# Patient Record
Sex: Male | Born: 1970 | Race: Black or African American | Hispanic: No | Smoking: Never smoker
Health system: Southern US, Community
[De-identification: ages and names within clinical notes are randomized; demographics above are authoritative.]

## PROBLEM LIST (undated history)

## (undated) ENCOUNTER — Emergency Department: Discharge status: Absent without leave | Payer: Federal, State, Local not specified - PPO

## (undated) DIAGNOSIS — D649 Anemia, unspecified: Secondary | ICD-10-CM

## (undated) DIAGNOSIS — I2510 Atherosclerotic heart disease of native coronary artery without angina pectoris: Secondary | ICD-10-CM

## (undated) DIAGNOSIS — C7951 Secondary malignant neoplasm of bone: Secondary | ICD-10-CM

## (undated) DIAGNOSIS — E119 Type 2 diabetes mellitus without complications: Secondary | ICD-10-CM

## (undated) DIAGNOSIS — C642 Malignant neoplasm of left kidney, except renal pelvis: Secondary | ICD-10-CM

## (undated) DIAGNOSIS — E7800 Pure hypercholesterolemia, unspecified: Secondary | ICD-10-CM

## (undated) DIAGNOSIS — I10 Essential (primary) hypertension: Secondary | ICD-10-CM

## (undated) DIAGNOSIS — I2694 Multiple subsegmental pulmonary emboli without acute cor pulmonale: Secondary | ICD-10-CM

## (undated) DIAGNOSIS — I700 Atherosclerosis of aorta: Secondary | ICD-10-CM

## (undated) DIAGNOSIS — K219 Gastro-esophageal reflux disease without esophagitis: Secondary | ICD-10-CM

## (undated) DIAGNOSIS — N289 Disorder of kidney and ureter, unspecified: Secondary | ICD-10-CM

## (undated) DIAGNOSIS — I5042 Chronic combined systolic (congestive) and diastolic (congestive) heart failure: Secondary | ICD-10-CM

## (undated) DIAGNOSIS — C801 Malignant (primary) neoplasm, unspecified: Secondary | ICD-10-CM

## (undated) DIAGNOSIS — T7840XA Allergy, unspecified, initial encounter: Secondary | ICD-10-CM

## (undated) DIAGNOSIS — J309 Allergic rhinitis, unspecified: Secondary | ICD-10-CM

## (undated) DIAGNOSIS — G4730 Sleep apnea, unspecified: Secondary | ICD-10-CM

## (undated) HISTORY — DX: Essential (primary) hypertension: I10

## (undated) HISTORY — PX: CORONARY ANGIOPLASTY: SHX604

## (undated) HISTORY — DX: Gastro-esophageal reflux disease without esophagitis: K21.9

## (undated) HISTORY — DX: Type 2 diabetes mellitus without complications: E11.9

## (undated) HISTORY — DX: Sleep apnea, unspecified: G47.30

## (undated) HISTORY — DX: Allergic rhinitis, unspecified: J30.9

## (undated) HISTORY — DX: Allergy, unspecified, initial encounter: T78.40XA

---

## 1999-06-28 ENCOUNTER — Emergency Department (HOSPITAL_COMMUNITY): Admission: EM | Admit: 1999-06-28 | Discharge: 1999-06-28 | Payer: Self-pay | Admitting: Emergency Medicine

## 2004-06-04 ENCOUNTER — Encounter: Payer: Self-pay | Admitting: Family Medicine

## 2004-06-09 ENCOUNTER — Ambulatory Visit: Payer: Self-pay | Admitting: Family Medicine

## 2004-06-17 ENCOUNTER — Ambulatory Visit: Payer: Self-pay | Admitting: Family Medicine

## 2005-02-24 ENCOUNTER — Ambulatory Visit: Payer: Self-pay | Admitting: Family Medicine

## 2005-08-25 ENCOUNTER — Ambulatory Visit: Payer: Self-pay | Admitting: Family Medicine

## 2006-03-17 ENCOUNTER — Ambulatory Visit: Payer: Self-pay | Admitting: Family Medicine

## 2006-12-20 ENCOUNTER — Ambulatory Visit: Payer: Self-pay | Admitting: Family Medicine

## 2006-12-20 DIAGNOSIS — R030 Elevated blood-pressure reading, without diagnosis of hypertension: Secondary | ICD-10-CM | POA: Insufficient documentation

## 2007-01-19 ENCOUNTER — Encounter: Payer: Self-pay | Admitting: Family Medicine

## 2007-01-19 DIAGNOSIS — J309 Allergic rhinitis, unspecified: Secondary | ICD-10-CM

## 2007-01-19 DIAGNOSIS — K219 Gastro-esophageal reflux disease without esophagitis: Secondary | ICD-10-CM | POA: Insufficient documentation

## 2007-01-19 DIAGNOSIS — T7840XA Allergy, unspecified, initial encounter: Secondary | ICD-10-CM | POA: Insufficient documentation

## 2007-01-19 HISTORY — DX: Gastro-esophageal reflux disease without esophagitis: K21.9

## 2007-01-19 HISTORY — DX: Allergic rhinitis, unspecified: J30.9

## 2007-01-23 ENCOUNTER — Ambulatory Visit: Payer: Self-pay | Admitting: Family Medicine

## 2007-02-07 ENCOUNTER — Ambulatory Visit: Payer: Self-pay | Admitting: Internal Medicine

## 2007-02-07 LAB — CONVERTED CEMR LAB
AST: 19 units/L (ref 0–37)
Alkaline Phosphatase: 57 units/L (ref 39–117)
Basophils Absolute: 0 10*3/uL (ref 0.0–0.1)
Basophils Relative: 0.8 % (ref 0.0–1.0)
Bilirubin, Direct: 0.3 mg/dL (ref 0.0–0.3)
CO2: 29 meq/L (ref 19–32)
GFR calc non Af Amer: 135 mL/min
Glucose, Bld: 100 mg/dL — ABNORMAL HIGH (ref 70–99)
HCT: 43.5 % (ref 39.0–52.0)
HDL: 45.7 mg/dL (ref >39.0)
Hemoglobin, Urine: NEGATIVE
Hemoglobin: 14.7 g/dL (ref 13.0–17.0)
Ketones, ur: NEGATIVE mg/dL
MCHC: 33.8 g/dL (ref 30.0–36.0)
Monocytes Absolute: 0.3 10*3/uL (ref 0.2–0.7)
Monocytes Relative: 5.6 % (ref 3.0–11.0)
Nitrite: NEGATIVE
Platelets: 325 10*3/uL (ref 150–400)
RBC: 4.98 M/uL (ref 4.22–5.81)
RDW: 12.4 % (ref 11.5–14.6)
Specific Gravity, Urine: 1.025 (ref 1.000–1.03)
TSH: 0.63 microintl units/mL (ref 0.35–5.50)
Total CHOL/HDL Ratio: 3.8
Triglycerides: 72 mg/dL (ref 0–149)
Urobilinogen, UA: 0.2 (ref 0.0–1.0)
VLDL: 14 mg/dL (ref 0–40)
WBC: 5.1 10*3/uL (ref 4.5–10.5)
pH: 6 (ref 5.0–8.0)

## 2007-02-16 ENCOUNTER — Encounter: Payer: Self-pay | Admitting: Internal Medicine

## 2007-02-16 ENCOUNTER — Ambulatory Visit: Payer: Self-pay | Admitting: Internal Medicine

## 2008-03-15 ENCOUNTER — Ambulatory Visit: Payer: Self-pay | Admitting: Internal Medicine

## 2008-03-15 DIAGNOSIS — J019 Acute sinusitis, unspecified: Secondary | ICD-10-CM | POA: Insufficient documentation

## 2008-04-29 ENCOUNTER — Ambulatory Visit: Payer: Self-pay | Admitting: Internal Medicine

## 2008-05-03 ENCOUNTER — Ambulatory Visit: Payer: Self-pay | Admitting: Endocrinology

## 2008-05-03 DIAGNOSIS — R059 Cough, unspecified: Secondary | ICD-10-CM | POA: Insufficient documentation

## 2008-05-03 DIAGNOSIS — R05 Cough: Secondary | ICD-10-CM

## 2008-05-03 DIAGNOSIS — R053 Chronic cough: Secondary | ICD-10-CM | POA: Insufficient documentation

## 2008-05-03 DIAGNOSIS — R052 Subacute cough: Secondary | ICD-10-CM | POA: Insufficient documentation

## 2008-05-06 ENCOUNTER — Telehealth: Payer: Self-pay | Admitting: Endocrinology

## 2008-05-07 ENCOUNTER — Ambulatory Visit: Payer: Self-pay | Admitting: Endocrinology

## 2008-05-17 ENCOUNTER — Emergency Department (HOSPITAL_COMMUNITY): Admission: EM | Admit: 2008-05-17 | Discharge: 2008-05-17 | Payer: Self-pay | Admitting: Emergency Medicine

## 2009-02-27 ENCOUNTER — Ambulatory Visit: Payer: Self-pay | Admitting: Internal Medicine

## 2009-02-27 LAB — CONVERTED CEMR LAB
AST: 20 units/L (ref 0–37)
Alkaline Phosphatase: 62 units/L (ref 39–117)
Basophils Absolute: 0.1 10*3/uL (ref 0.0–0.1)
Basophils Relative: 1.1 % (ref 0.0–3.0)
Bilirubin, Direct: 0.2 mg/dL (ref 0.0–0.3)
Chloride: 102 meq/L (ref 96–112)
Eosinophils Absolute: 0.1 10*3/uL (ref 0.0–0.7)
Eosinophils Relative: 2.3 % (ref 0.0–5.0)
HCT: 50 % (ref 39.0–52.0)
Hemoglobin: 17 g/dL (ref 13.0–17.0)
Ketones, ur: NEGATIVE mg/dL
LDL Cholesterol: 116 mg/dL — ABNORMAL HIGH (ref 0–99)
Leukocytes, UA: NEGATIVE
Lymphocytes Relative: 31.9 % (ref 12.0–46.0)
Lymphs Abs: 1.8 10*3/uL (ref 0.7–4.0)
Monocytes Absolute: 0.4 10*3/uL (ref 0.1–1.0)
Platelets: 281 10*3/uL (ref 150.0–400.0)
RDW: 12.1 % (ref 11.5–14.6)
Sodium: 138 meq/L (ref 135–145)
TSH: 0.81 microintl units/mL (ref 0.35–5.50)
Total Bilirubin: 1.6 mg/dL — ABNORMAL HIGH (ref 0.3–1.2)
Total Protein, Urine: NEGATIVE mg/dL
Total Protein: 7.3 g/dL (ref 6.0–8.3)
Triglycerides: 65 mg/dL (ref 0.0–149.0)
Urobilinogen, UA: 0.2 (ref 0.0–1.0)
VLDL: 13 mg/dL (ref 0.0–40.0)
WBC: 5.5 10*3/uL (ref 4.5–10.5)

## 2009-03-06 ENCOUNTER — Ambulatory Visit: Payer: Self-pay | Admitting: Internal Medicine

## 2009-10-06 ENCOUNTER — Telehealth: Payer: Self-pay | Admitting: Internal Medicine

## 2009-11-18 ENCOUNTER — Telehealth: Payer: Self-pay | Admitting: Internal Medicine

## 2009-11-18 DIAGNOSIS — R21 Rash and other nonspecific skin eruption: Secondary | ICD-10-CM | POA: Insufficient documentation

## 2009-11-21 ENCOUNTER — Telehealth: Payer: Self-pay | Admitting: Internal Medicine

## 2010-03-25 ENCOUNTER — Emergency Department (HOSPITAL_COMMUNITY): Admission: EM | Admit: 2010-03-25 | Discharge: 2010-03-25 | Payer: Self-pay | Admitting: Emergency Medicine

## 2010-04-08 ENCOUNTER — Ambulatory Visit: Payer: Self-pay | Admitting: Internal Medicine

## 2010-05-25 ENCOUNTER — Ambulatory Visit: Payer: Self-pay | Admitting: Internal Medicine

## 2010-08-06 NOTE — Assessment & Plan Note (Signed)
Summary: ER FU ON ACID REFLUX/NWS  #   Vital Signs:  Patient profile:   40 year old male Height:      70 inches Weight:      179 pounds BMI:     25.78 O2 Sat:      98 % on Room air Temp:     98.4 degrees F oral Pulse rate:   80 / minute BP sitting:   140 / 88  (left arm) Cuff size:   regular  Vitals Entered By: Bill Salinas CMA (April 08, 2010 11:41 AM)  O2 Flow:  Room air CC: ER follow up on Acid Reflux/ ab   Primary Care Provider:  norins  CC:  ER follow up on Acid Reflux/ ab.  History of Present Illness: Paul Flowers is a 40 y.o. African American male who recently went to the ER on 21SEP2011 for acute, substernal pressure-like chest pain. He was diagnosed with acid reflux and given prevacid (30 mg). He has been taking Acid Controller 20 mg once daily for his acid reflux. He admits to be experiencing reccurent reflux despite taking one pill of Acid Controller a day.  Paul Flowers was also concerned about a recent episode on diarrhea and what he suspected to be a bloody stool. The day he had his diarrhea he had been taking chewable Pepto-Bismol. The bloody stool he was concerned about occured the next day.   Current Medications (verified): 1)  Prevacid 30 Mg Cpdr (Lansoprazole) .... As Needed 2)  Fluticasone Propionate 50 Mcg/act Susp (Fluticasone Propionate) .Marland Kitchen.. 1 Spray Per Nostril Daily 3)  Xyzal 5 Mg Tabs (Levocetirizine Dihydrochloride) .... Once Daily As Needed 4)  [facility] Acid Controller Max St 20 Mg Tabs (Famotidine) .Marland Kitchen.. 1 Tablet Once Daily  Allergies (verified): No Known Drug Allergies  Past History:  Past Medical History: Last updated: 02/16/2007 Allergic rhinitis GERD pre-hypertension hx of mumps and chicken pox  Past Surgical History: Last updated: 03/15/2008 Denies surgical history PSH reviewed for relevance, FH reviewed for relevance  Review of Systems       Chest pain was the original symptom and why he presented to the ER on 21SEP2011 His concern  about melena was alleviated when he was told that pepto-bismol causes darkened stool  Physical Exam  General:  alert, well-developed, well-nourished, appropriate dress, normal appearance, healthy-appearing, cooperative to examination, and good hygiene.   Eyes:  vision grossly intact, pupils equal, corneas and lenses clear, and no injection.   Nose:  no external deformity and no nasal discharge.   Lungs:  normal respiratory effort, no intercostal retractions, no accessory muscle use, normal breath sounds, no dullness, no fremitus, no crackles, and no wheezes.   Heart:  normal rate, regular rhythm, and no murmur.   Abdomen:  epigastric tenderness.   Pulses:  R radial normal.     Impression & Recommendations:  Problem # 1:  DYSPEPSIA (ICD-536.8) Patient went to the ER for chest pain substernally. He was diagnosed with dyspepsia. Since his ER visit he has had recurrences of dyspepsia.  Plan- Increase Acid Controller dosage from 1 to 2 20mg  pills a day for three to four weeks to allow esophagus to heal.            Explained the causal agents of acid reflux to Mr. Advani and a hiatal hernia.          Educated patient that the darkened stool was most likely a cause of the pepto-bismol chewable tablets he consumed.  Educated patient about true melena, color, consistency, and odor          Educated patient to decrease fast food intake for health benefits, although it doesn't cause acid reflux.  Problem # 2:  ELEVATED BLOOD PRESSURE (ICD-796.2) Patient was concerned about his increase in blood pressure since his last visit. While still not hypertensive, he was concerned why it was considerably higher than his last doctor's visit.  Plan- Decrease fast food intake          Decrease salt intake          Excersise more  Complete Medication List: 1)  Prevacid 30 Mg Cpdr (Lansoprazole) .... As needed 2)  Fluticasone Propionate 50 Mcg/act Susp (Fluticasone propionate) .Marland Kitchen.. 1 spray per  nostril daily 3)  Xyzal 5 Mg Tabs (Levocetirizine dihydrochloride) .... Once daily as needed 4)  [facility] Acid Controller Max St 20 Mg Tabs (Famotidine) .Marland Kitchen.. 1 tablet once daily

## 2010-08-06 NOTE — Letter (Signed)
Summary: Out of Work  [facility]  [address] [place], [place] [address]   Phone: [phone]  Fax: [phone]    May 25, 2010   Employee:  Paul Flowers    To Whom It May Concern:   For Medical reasons, please excuse the above named employee from work for the following dates:  Start:   05/25/10  End:   05/26/10  If you need additional information, please feel free to contact our office.         Sincerely,    [name] MD

## 2010-08-06 NOTE — Progress Notes (Signed)
Summary: Yeast infection  Phone Note Call from Patient Call back at [phone]   Summary of Call: Pt is c/o yeast infection, same as last office visit. Per notes and pt, he was told to treat w/otc products. These have not helped. Pt scheduled office visit for tomorrow but wants to know if he needs ov? OR will Dr call in rx  Initial call taken by: Lamar Sprinkles, CMA,  October 06, 2009 11:17 AM  Follow-up for Phone Call        May try lotrisone 15g apply thinly to rash. May cancel 4/5/ appt. If no response to lotrisone will refer to Derm Follow-up by: Jacques Navy MD,  October 06, 2009 12:22 PM  Additional Follow-up for Phone Call Additional follow up Details #1::        left mess to call office back.....................Marland KitchenLamar Sprinkles, CMA  October 06, 2009 1:45 PM     Additional Follow-up for Phone Call Additional follow up Details #2::    Pt informed  Follow-up by: Lamar Sprinkles, CMA,  October 06, 2009 2:12 PM  New/Updated Medications: LOTRISONE 1-0.05 % CREA (CLOTRIMAZOLE-BETAMETHASONE) apply thinly to rash Prescriptions: LOTRISONE 1-0.05 % CREA (CLOTRIMAZOLE-BETAMETHASONE) apply thinly to rash  #15 x 0   Entered by:   Lamar Sprinkles, CMA   Authorized by:   Jacques Navy MD   Signed by:   Lamar Sprinkles, CMA on 10/06/2009   Method used:   [facility]        [facility]  [place] ([facility]ail[facility]       [address]       [place], [place]  [address]       Ph: [phone] or [phone]       Fax: [phone]   RxID:   [id number]

## 2010-08-06 NOTE — Progress Notes (Signed)
Summary: REFILL  Phone Note Refill Request   Refills Requested: Medication #1:  LOTRISONE 1-0.05 % CREA apply thinly to rash. Patient is requesting refill. Attempted to contact pt, see previous phone note. Does pt need referral to derm if rash has returned?   Initial call taken by: Lamar Sprinkles, CMA,  Nov 18, 2009 8:53 AM  Follow-up for Phone Call        Spoke w/pt, he says yeast rash did not clear completely when using the cream. Do you want to refer to dermatology?  Follow-up by: Lamar Sprinkles, CMA,  Nov 18, 2009 9:27 AM  Additional Follow-up for Phone Call Additional follow up Details #1::        if rash did not go away derm consult is appropriate. [facility] notified Additional Follow-up by: Jacques Navy MD,  Nov 18, 2009 10:47 AM  New Problems: SKIN RASH (ICD-782.1)   Additional Follow-up for Phone Call Additional follow up Details #2::    Pt informed  Follow-up by: Lamar Sprinkles, CMA,  Nov 18, 2009 12:10 PM  New Problems: SKIN RASH (ICD-782.1)

## 2010-08-06 NOTE — Progress Notes (Signed)
Summary: refill--Lotrisone  Phone Note Refill Request Message from:  Fax from Pharmacy on Nov 21, 2009 3:49 PM  Refills Requested: Medication #1:  LOTRISONE 1-0.05 % CREA apply thinly to rash. Initial call taken by: Lucious Groves,  Nov 21, 2009 3:49 PM  Follow-up for Phone Call        ok to ref F/u w/Dr Norins Follow-up by: Tresa Garter MD,  Nov 21, 2009 5:20 PM    Prescriptions: LOTRISONE 1-0.05 % CREA (CLOTRIMAZOLE-BETAMETHASONE) apply thinly to rash  #15 x 0   Entered by:   Lamar Sprinkles, CMA   Authorized by:   Jacques Navy MD   Signed by:   Lamar Sprinkles, CMA on 11/21/2009   Method used:   [facility]        [facility]  [place] (retail)       [address]       [place], [place]  [address]       Ph: [phone] or [phone]       Fax: [phone]   RxID:   [id number]

## 2010-08-06 NOTE — Assessment & Plan Note (Signed)
Summary: DR MEN PT-NEED EARLIER PM SLOT-COUGH CONGESTION MUCUS LOOSING...   Vital Signs:  Patient profile:   40 year old male Height:      70 inches Weight:      178 pounds BMI:     25.63 O2 Sat:      98 % on Room air Temp:     99.2 degrees F oral Pulse rate:   90 / minute Pulse rhythm:   regular Resp:     16 per minute BP sitting:   138 / 82  (left arm) Cuff size:   regular  Vitals Entered By: Lanier Prude, CMA(AAMA) (May 25, 2010 1:11 PM)  Nutrition Counseling: Patient's BMI is greater than 25 and therefore counseled on weight management options.  O2 Flow:  Room air CC: cough, congestion, hoarseness, fever X 4 days, URI symptoms Is Patient Diabetic? No Pain Assessment Patient in pain? no        Primary Care Tomeika Weinmann:  norins  CC:  cough, congestion, hoarseness, fever X 4 days, and URI symptoms.  History of Present Illness:  URI Symptoms      This is a 40 year old man who presents with URI symptoms.  The symptoms began 5 days ago.  The severity is described as mild.  The patient reports nasal congestion, purulent nasal discharge, and productive cough, but denies sore throat, dry cough, earache, and sick contacts.  Associated symptoms include low-grade fever (<100.5 degrees).  The patient denies stiff neck, dyspnea, wheezing, rash, vomiting, diarrhea, use of an antipyretic, and response to antipyretic.  The patient denies itchy throat, sneezing, headache, muscle aches, and severe fatigue.  The patient denies the following risk factors for Strep sinusitis: unilateral facial pain, unilateral nasal discharge, poor response to decongestant, double sickening, tooth pain, Strep exposure, tender adenopathy, and absence of cough.    Preventive Screening-Counseling & Management  Alcohol-Tobacco     Alcohol drinks/day: 0     Alcohol Counseling: not indicated; patient does not drink     Smoking Status: never     Passive Smoke Exposure: no     Tobacco Counseling: not  indicated; no tobacco use  Hep-HIV-STD-Contraception     HIV Risk: no      Drug Use:  no.    Medications Prior to Update: 1)  Prevacid 30 Mg Cpdr (Lansoprazole) .... As Needed 2)  Fluticasone Propionate 50 Mcg/act Susp (Fluticasone Propionate) .Marland Kitchen.. 1 Spray Per Nostril Daily 3)  Xyzal 5 Mg Tabs (Levocetirizine Dihydrochloride) .... Once Daily As Needed 4)  [facility] Acid Controller Max St 20 Mg Tabs (Famotidine) .Marland Kitchen.. 1 Tablet Once Daily  Current Medications (verified): 1)  Prevacid 30 Mg Cpdr (Lansoprazole) .... As Needed 2)  Fluticasone Propionate 50 Mcg/act Susp (Fluticasone Propionate) .Marland Kitchen.. 1 Spray Per Nostril Daily 3)  Xyzal 5 Mg Tabs (Levocetirizine Dihydrochloride) .... Once Daily As Needed 4)  [facility] Acid Controller Max St 20 Mg Tabs (Famotidine) .Marland Kitchen.. 1 Tablet Once Daily 5)  Mytussin Ac 100-10 Mg/76ml Syrp (Guaifenesin-Codeine) .... 5-10 Ml By Mouth Qid As Needed For Cough 6)  Avelox Abc Pack 400 Mg Tabs (Moxifloxacin Hcl) .... One By Mouth Once Daily For 5 Days  Allergies (verified): No Known Drug Allergies  Past History:  Past Medical History: Last updated: 02/16/2007 Allergic rhinitis GERD pre-hypertension hx of mumps and chicken pox  Past Surgical History: Last updated: 03/15/2008 Denies surgical history  Family History: Last updated: 02/16/2007 Father: Alive 22 L&W, s/p CABG Mother: Alive 50 L&W, s/p lap chole with  perforation and long hosp. course Siblings: 1 brother alive and well, 1 sister alive and well CV:  MGM - heart disease HBP:  ? 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 Father: Alive 68 L&W, s/p CABG Mother: Alive 40 L&W, s/p lap chole with perforation and long hosp. course Siblings: 1 brother alive and well, 1 sister alive and well CV:  MGM - heart disease HBP:  ? DM:  MGM, MG Aunt Stroke: MGF, MGA  Social History: Reviewed history from 02/16/2007 and no changes required. 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  The patient denies anorexia, fever, decreased hearing, chest pain, syncope, dyspnea on exertion, peripheral edema, prolonged cough, headaches, hemoptysis, abdominal pain, hematuria, suspicious skin lesions, and enlarged lymph nodes.    Physical Exam  General:  alert, well-developed, well-nourished, well-hydrated, appropriate dress, normal appearance, healthy-appearing, and cooperative to examination.   Head:  normocephalic and atraumatic.   Ears:  R ear normal and L ear normal.   Nose:  no external deformity, no airflow obstruction, no intranasal foreign body, no nasal polyps, no nasal mucosal lesions, no mucosal friability, no active bleeding or clots, no sinus percussion tenderness, no septum abnormalities, nasal discharge, mucosal erythema, and mucosal edema.   Mouth:  good dentition, pharynx pink and moist, no erythema, no exudates, no posterior lymphoid hypertrophy, no postnasal drip, no pharyngeal crowing, no lesions, and no aphthous ulcers.   Neck:  supple, full ROM, no masses, no thyromegaly, no JVD, normal carotid upstroke, no carotid bruits, no cervical lymphadenopathy, and no neck tenderness.   Lungs:  normal respiratory effort, no intercostal retractions, no  accessory muscle use, normal breath sounds, no dullness, no fremitus, no crackles, and no wheezes.   Heart:  normal rate, regular rhythm, no murmur, no gallop, no rub, and no JVD.   Abdomen:  soft, non-tender, normal bowel sounds, no distention, no masses, no guarding, no rigidity, no rebound tenderness, no abdominal hernia, no inguinal hernia, no hepatomegaly, and no splenomegaly.   Msk:  normal ROM, no joint tenderness, no joint swelling, no joint warmth, no redness over joints, no joint deformities, no joint instability, and no crepitation.   Pulses:  R and L carotid,radial,femoral,dorsalis pedis and posterior tibial pulses are full and equal bilaterally Extremities:  No clubbing, cyanosis, edema, or deformity noted with normal full range of motion of all joints.   Neurologic:  No cranial nerve deficits noted. Station and gait are normal. Plantar reflexes are down-going bilaterally. DTRs are symmetrical throughout. Sensory, motor and coordinative functions appear intact. Skin:  turgor normal, color normal, no rashes, no suspicious lesions, no ecchymoses, no petechiae, no purpura, no ulcerations, and no edema.   Cervical Nodes:  no anterior cervical adenopathy and no posterior cervical adenopathy.   Axillary Nodes:  no R axillary adenopathy and  no L axillary adenopathy.   Psych:  Cognition and judgment appear intact. Alert and cooperative with normal attention span and concentration. No apparent delusions, illusions, hallucinations   Impression & Recommendations:  Problem # 1:  BRONCHITIS-ACUTE (ICD-466.0)  His updated medication list for this problem includes:    Mytussin Ac 100-10 Mg/33ml Syrp (Guaifenesin-codeine) .Marland Kitchen... 5-10 ml by mouth qid as needed for cough    Avelox Abc Pack 400 Mg Tabs (Moxifloxacin hcl) ..... One by mouth once daily for 5 days  Take antibiotics and other medications as directed. Encouraged to push clear liquids, get enough rest, and take acetaminophen as needed. To be  seen in 5-7 days if no improvement, sooner if worse.  Complete Medication List: 1)  Prevacid 30 Mg Cpdr (Lansoprazole) .... As needed 2)  Fluticasone Propionate 50 Mcg/act Susp (Fluticasone propionate) .Marland Kitchen.. 1 spray per nostril daily 3)  Xyzal 5 Mg Tabs (Levocetirizine dihydrochloride) .... Once daily as needed 4)  [facility] Acid Controller Max St 20 Mg Tabs (Famotidine) .Marland Kitchen.. 1 tablet once daily 5)  Mytussin Ac 100-10 Mg/23ml Syrp (Guaifenesin-codeine) .... 5-10 ml by mouth qid as needed for cough 6)  Avelox Abc Pack 400 Mg Tabs (Moxifloxacin hcl) .... One by mouth once daily for 5 days  Patient Instructions: 1)  Please schedule a follow-up appointment in 2 weeks. 2)  Take your antibiotic as prescribed until ALL of it is gone, but stop if you develop a rash or swelling and contact our office as soon as possible. 3)  Acute bronchitis symptoms for less than 10 days are not helped by antibiotics. take over the counter cough medications. call if no improvment in  5-7 days, sooner if increasing cough, fever, or new symptoms( shortness of breath, chest pain). Prescriptions: AVELOX ABC PACK 400 MG TABS (MOXIFLOXACIN HCL) One by mouth once daily for 5 days  #5 x 0   Entered and Authorized by:   [name] MD   Signed by:   [name] MD on 05/25/2010   Method used:   Print then Give to Patient   RxID:   [id number] MYTUSSIN AC 100-10 MG/5ML SYRP (GUAIFENESIN-CODEINE) 5-10 ml by mouth QID as needed for cough  #8 ounces x 0   Entered and Authorized by:   [name] MD   Signed by:   [name] MD on 05/25/2010   Method used:   Print then Give to Patient   RxID:   [id number]    Orders Added: 1)  Est. Patient Level IV [66440]

## 2010-09-17 LAB — POCT CARDIAC MARKERS
CKMB, poc: <1 ng/mL — ABNORMAL LOW (ref 1.0–8.0)
Myoglobin, poc: 59.6 ng/mL (ref 12–200)
Troponin i, poc: <0.05 ng/mL (ref 0.00–0.09)

## 2010-09-17 LAB — BASIC METABOLIC PANEL
BUN: 8 mg/dL (ref 6–23)
Calcium: 9.3 mg/dL (ref 8.4–10.5)
Chloride: 103 mEq/L (ref 96–112)
Creatinine, Ser: 0.87 mg/dL (ref 0.4–1.5)
GFR calc Af Amer: >60 mL/min (ref >60)
GFR calc non Af Amer: >60 mL/min (ref >60)

## 2010-09-17 LAB — CBC
MCV: 88.3 fL (ref 78.0–100.0)
Platelets: 276 10*3/uL (ref 150–400)
RBC: 4.95 MIL/uL (ref 4.22–5.81)
RDW: 12 % (ref 11.5–15.5)
WBC: 6.6 10*3/uL (ref 4.0–10.5)

## 2010-09-17 LAB — DIFFERENTIAL
Basophils Absolute: 0.1 10*3/uL (ref 0.0–0.1)
Eosinophils Relative: 4 % (ref 0–5)
Lymphocytes Relative: 31 % (ref 12–46)
Lymphs Abs: 2 10*3/uL (ref 0.7–4.0)
Neutro Abs: 3.5 10*3/uL (ref 1.7–7.7)
Neutrophils Relative %: 53 % (ref 43–77)

## 2010-09-21 ENCOUNTER — Telehealth: Payer: Self-pay | Admitting: Internal Medicine

## 2010-10-01 NOTE — Progress Notes (Signed)
Summary: PA  Phone Note Call from Patient Call back at Home Phone [phone]   Caller: Patient Reason for Call: Refill Medication Summary of Call: Pt states PA needed for Lansoprazole w/[facility] [phone] [facility]: [phone] Initial call taken by: Burnard Leigh [facility]),  September 21, 2010 2:27 PM  Follow-up for Phone Call        Obtained PA. [facility]formed [facility] informed. Follow-up by: Burnard Leigh [facility]),  September 21, 2010 2:28 PM

## 2010-10-22 ENCOUNTER — Ambulatory Visit (INDEPENDENT_AMBULATORY_CARE_PROVIDER_SITE_OTHER): Payer: BC Managed Care – PPO | Admitting: Internal Medicine

## 2010-10-22 ENCOUNTER — Encounter: Payer: Self-pay | Admitting: Internal Medicine

## 2010-10-22 VITALS — BP 128/86 | HR 81 | Temp 98.4°F | Wt 182.0 lb

## 2010-10-22 DIAGNOSIS — J309 Allergic rhinitis, unspecified: Secondary | ICD-10-CM

## 2010-10-22 MED ORDER — PREDNISONE 20 MG PO TABS: 20.0000 mg | ORAL_TABLET | Freq: Every day | ORAL | Status: AC

## 2010-10-22 MED ORDER — FLUTICASONE FUROATE 27.5 MCG/SPRAY NA SUSP: 2.0000 | Freq: Every day | NASAL | Status: DC

## 2010-10-22 NOTE — Progress Notes (Signed)
  Subjective:    Patient ID: Paul Flowers, male    DOB: 09-Jun-1971, 40 y.o.   MRN: [id number]  HPIMr. Richison presents due to a flare in allergy symptoms: itching watery eyes, rhinorrhea and post-nasal drainage, laryngitis. He has had no fever, chills, increased SOB, enlarged lymph nodes. He has had symptoms since mid-March that have been progressive. He was on allergy shots in the past under the care of Dr. Mingo Callas. He has not been using his flonase spray.  PMH, FamHx and SocHx reviewed in centricity for any changes and relevance.          Review of Systems  Constitutional: Negative for fever, chills, activity change and fatigue.  HENT: Positive for sneezing and postnasal drip. Negative for ear pain, congestion, facial swelling, neck stiffness, sinus pressure and ear discharge.        Laryngitis  Eyes: Positive for redness and itching. Negative for discharge.  Respiratory: Negative for apnea, cough, choking, shortness of breath and wheezing.   Cardiovascular: Negative for chest pain.  All other systems reviewed and are negative.       Objective:   Physical Exam WNWD AA male in no distress HEENT- [place]/AT, C&S clear, PERRLA, throat clear Lungs - CTAP Cor - RRR       Assessment & Plan:  1. Allergic rhinitis - known history of allergy.   Plan - resume flonase a spray/nostril bid until symptoms controlled then just qd           Prednisone 20mg  qd x 10 days           He may self-refer back to [facility]  - [place] office.

## 2010-10-31 ENCOUNTER — Other Ambulatory Visit: Payer: Self-pay | Admitting: Internal Medicine

## 2011-01-28 ENCOUNTER — Ambulatory Visit (INDEPENDENT_AMBULATORY_CARE_PROVIDER_SITE_OTHER): Payer: BC Managed Care – PPO | Admitting: Internal Medicine

## 2011-01-28 VITALS — BP 128/86 | HR 91 | Temp 98.4°F | Wt 182.0 lb

## 2011-01-28 DIAGNOSIS — G4730 Sleep apnea, unspecified: Secondary | ICD-10-CM

## 2011-01-28 NOTE — Progress Notes (Signed)
  Subjective:    Patient ID: Paul Flowers, male    DOB: 13-Mar-1971, 40 y.o.   MRN: [id number]  HPI Paul Flowers presents due to increased snoring and a timed apneic episode of 24 seconds. He does have daytime drowsiness and fatigue, he does have trouble staying awake at the wheel.   PMH, FamHx and SocHx reviewed for any changes and relevance.  Review of Systems  Constitutional: Negative.   HENT: Negative.   Respiratory: Negative.   Cardiovascular: Negative.   Gastrointestinal: Negative.   Musculoskeletal: Negative.        Objective:   Physical Exam Vitals reviewed and normal Gen'l - WNWD AA man in no distress Pul - normal respiration Cor regular pulse       Assessment & Plan:

## 2011-01-29 DIAGNOSIS — G4730 Sleep apnea, unspecified: Secondary | ICD-10-CM | POA: Insufficient documentation

## 2011-01-29 HISTORY — DX: Sleep apnea, unspecified: G47.30

## 2011-01-29 NOTE — Assessment & Plan Note (Signed)
Patient with somnolence and now observed apnea with a 24 second respiratory pause.  Plan - for sleep study

## 2011-02-03 ENCOUNTER — Other Ambulatory Visit: Payer: Self-pay | Admitting: Internal Medicine

## 2011-02-03 DIAGNOSIS — G4730 Sleep apnea, unspecified: Secondary | ICD-10-CM

## 2011-02-11 ENCOUNTER — Ambulatory Visit (HOSPITAL_BASED_OUTPATIENT_CLINIC_OR_DEPARTMENT_OTHER): Payer: BC Managed Care – PPO | Attending: Internal Medicine

## 2011-02-11 DIAGNOSIS — G4733 Obstructive sleep apnea (adult) (pediatric): Secondary | ICD-10-CM | POA: Insufficient documentation

## 2011-02-11 DIAGNOSIS — G4737 Central sleep apnea in conditions classified elsewhere: Secondary | ICD-10-CM | POA: Insufficient documentation

## 2011-02-11 DIAGNOSIS — G4730 Sleep apnea, unspecified: Secondary | ICD-10-CM

## 2011-02-17 ENCOUNTER — Encounter: Payer: Self-pay | Admitting: Internal Medicine

## 2011-02-17 DIAGNOSIS — G4737 Central sleep apnea in conditions classified elsewhere: Secondary | ICD-10-CM

## 2011-02-17 DIAGNOSIS — G4733 Obstructive sleep apnea (adult) (pediatric): Secondary | ICD-10-CM

## 2011-02-17 NOTE — Procedures (Signed)
Paul Flowers, Paul Flowers                ACCOUNT NO.:  [id number]  MEDICAL RECORD NO.:  [id number]          PATIENT TYPE:  OUT  LOCATION:  SLEEP CENTER                 FACILITY[facility]  [facility]  PHYSICIAN:  Barbaraann Share, MD,FCCPDATE OF BIRTH:  03-14-69  DATE OF STUDY:  02/11/2011                           NOCTURNAL POLYSOMNOGRAM  REFERRING PHYSICIAN:  Rosalyn Gess. Norins, MD  INDICATION FOR STUDY:  Hypersomnia with sleep apnea.  EPWORTH SLEEPINESS SCORE:  9.  MEDICATIONS:  SLEEP ARCHITECTURE:  The patient had a total sleep time of 227 minutes with very little slow wave sleep and only 36 minutes of REM.  Sleep onset latency was prolonged at 104 minutes, and REM onset was normal at 55 minutes.  Sleep efficiency was poor at 60%.  RESPIRATORY DATA:  The patient was found to have 48 obstructive and central apneas, as well as 36 obstructive hypopneas, giving him an apnea/hypopnea index of 22 events per hour.  The events occurred primarily in the supine position and there was moderate snoring noted throughout.  OXYGEN DATA:  There was O2 desaturation as low as 85% with the patient's obstructive events.  CARDIAC DATA:  Occasional PACs noted, but no clinically significant arrhythmias were seen.  MOVEMENT-PARASOMNIA:  The patient had no significant leg jerks or other abnormal behaviors.  IMPRESSIONS-RECOMMENDATIONS: 1. Moderate obstructive and central sleep apnea with an AHI of 22     events per hour and O2 desaturation as low as 85%.  Treatment for     this degree of sleep apnea can include a trial of weight loss if     applicable to the patient, upper airway surgery, dental appliance,     and also a CPAP.  Clinical correlation is suggested. 2. Occasional PAC noted, but no clinically significant arrhythmias     were seen.    Barbaraann Share, MD,FCCP Diplomate, American Board of Sleep Medicine Electronically Signed   [facility]  D:  02/17/2011 08:45:15  T:  02/17/2011 10:21:05  Job:   [id number]

## 2011-02-18 ENCOUNTER — Ambulatory Visit (INDEPENDENT_AMBULATORY_CARE_PROVIDER_SITE_OTHER): Payer: BC Managed Care – PPO | Admitting: Internal Medicine

## 2011-02-18 VITALS — BP 118/80 | HR 78 | Temp 98.1°F | Wt 183.0 lb

## 2011-02-18 DIAGNOSIS — R229 Localized swelling, mass and lump, unspecified: Secondary | ICD-10-CM

## 2011-02-21 NOTE — Progress Notes (Signed)
  Subjective:    Patient ID: Paul Flowers, male    DOB: 26-Nov-1970, 40 y.o.   MRN: [id number]  HPI Mrt. Koopmann presents for evaluation of a nodule in the submental region. He noticed this approximately 1 week ago. Initially it was tender and painful but is not not tender at all. He denies any injury or shaving accident. He has had not dental problems. He has not had any fever or chills.  I have reviewed the patient's medical history in detail and updated the computerized patient record.    Review of Systems System review is negative for any constitutional, cardiac, pulmonary, GI or neuro symptoms or complaints      Objective:   Physical Exam WNWD AA male in no distress HEENT - C&S clear, oropharynx w/o lesion Cor 2+ radial pulse Pul - normal respirations. Nodes - no submental or cervical lymphadenopathy Derm - subcutaneous nodule to the left of midline under the chin.       Assessment & Plan:  Cutaneous nodule - suspect a granuloma formation after infection  - hair follicle.  Plan - no further work-up at this time           Watch for any change in size or nature of nodule.

## 2011-03-01 ENCOUNTER — Telehealth: Payer: Self-pay | Admitting: Internal Medicine

## 2011-03-01 DIAGNOSIS — G4733 Obstructive sleep apnea (adult) (pediatric): Secondary | ICD-10-CM

## 2011-03-01 NOTE — Telephone Encounter (Signed)
Patient notified

## 2011-03-01 NOTE — Telephone Encounter (Signed)
LMOVM for pt to call back 

## 2011-03-01 NOTE — Telephone Encounter (Signed)
Call patient: sleep study reveals moderate sleep apnea with 22 events per hours. Have referred to sleep doc.  Thanks

## 2011-03-19 ENCOUNTER — Ambulatory Visit (INDEPENDENT_AMBULATORY_CARE_PROVIDER_SITE_OTHER): Payer: BC Managed Care – PPO | Admitting: Pulmonary Disease

## 2011-03-19 ENCOUNTER — Encounter: Payer: Self-pay | Admitting: Pulmonary Disease

## 2011-03-19 VITALS — BP 125/82 | HR 84 | Temp 98.2°F | Ht 70.0 in | Wt 182.2 lb

## 2011-03-19 DIAGNOSIS — G4733 Obstructive sleep apnea (adult) (pediatric): Secondary | ICD-10-CM

## 2011-03-19 NOTE — Patient Instructions (Signed)
Will set up on cpap, and please call if having tolerance issues.  Work on weight loss followup with me in 5 weeks.

## 2011-03-19 NOTE — Progress Notes (Signed)
  Subjective:    Patient ID: Paul Flowers, male    DOB: Jan 20, 1971, 40 y.o.   MRN: [id number]  HPI The patient is a 40 year old male who I've been asked to see for management of sleep apnea.  He recently underwent a sleep study, which showed moderate sleep apnea with an AHI of 22 events per hour.  The patient states that he has loud snoring, and has been told that he has an abnormal reading pattern during sleep.  He is not rested in the mornings upon arising, and notes early morning headaches as well.  The patient admits to having significant sleep pressure with periods of in activity during the day.  He also has significant sleepiness while watching television or movies in the evening.  He also notes sleep pressure with driving longer distances.  The patient states that his weight is up 10 pounds over the last 2 years.  Sleep Questionnaire: What time do you typically go to bed?( Between what hours) 2am-3am How long does it take you to fall asleep? less than 10 minutes How many times during the night do you wake up? 2 What time do you get out of bed to start your day? 0900 Do you drive or operate heavy machinery in your occupation? No How much has your weight changed (up or down) over the past two years? (In pounds) 10 lb (4.536 kg) Have you ever had a sleep study before? No If yes, location of study? [place] If yes, date of study? 8/12 Do you currently use CPAP? No Do you wear oxygen at any time?     Review of Systems  Constitutional: Negative for fever and unexpected weight change.  HENT: Positive for sinus pressure. Negative for ear pain, nosebleeds, congestion, sore throat, rhinorrhea, sneezing, trouble swallowing, dental problem and postnasal drip.   Eyes: Negative for redness and itching.  Respiratory: Negative for cough, chest tightness, shortness of breath and wheezing.   Cardiovascular: Negative for palpitations and leg swelling.  Gastrointestinal: Negative for nausea and vomiting.    Genitourinary: Negative for dysuria.  Musculoskeletal: Negative for joint swelling.  Skin: Negative for rash.  Neurological: Positive for headaches.  Hematological: Does not bruise/bleed easily.  Psychiatric/Behavioral: Negative for dysphoric mood. The patient is not nervous/anxious.        Objective:   Physical Exam Constitutional:  Well developed, no acute distress  HENT:  Nares patent without discharge, mild narrowing on right  Oropharynx without exudate, palate and uvula are moderately elongated, with side wall narrowing.   Eyes:  Perrla, eomi, no scleral icterus  Neck:  No JVD, no TMG  Cardiovascular:  Normal rate, regular rhythm, no rubs or gallops.  No murmurs        Intact distal pulses  Pulmonary :  Normal breath sounds, no stridor or respiratory distress   No rales, rhonchi, or wheezing  Abdominal:  Soft, nondistended, bowel sounds present.  No tenderness noted.   Musculoskeletal:  No lower extremity edema noted.  Lymph Nodes:  No cervical lymphadenopathy noted  Skin:  No cyanosis noted  Neurologic:  Alert, appropriate, moves all 4 extremities without obvious deficit.         Assessment & Plan:

## 2011-03-19 NOTE — Assessment & Plan Note (Signed)
The patient has moderate obstructive sleep apnea by his recent studies, and he is clearly having disrupted sleep and daytime hypersomnia.  I have had a long discussion with the pt about sleep apnea, including its impact on QOL and CV health.  I have reviewed the various treatment options for moderate sleep apnea, including weight loss, upper airway surgery, dental appliance, and CPAP.  I really think his best options are dental appliance and CPAP while he is working on weight loss.  CPAP is much easier to start and stop, and would get him going on therapy ASAP.  Patient states that he did not have this issue when he was 10 pounds lighter, and perhaps he will not require any treatment once he gets down to this prior weight.

## 2011-04-22 ENCOUNTER — Encounter: Payer: Self-pay | Admitting: Pulmonary Disease

## 2011-04-22 ENCOUNTER — Ambulatory Visit (INDEPENDENT_AMBULATORY_CARE_PROVIDER_SITE_OTHER): Payer: BC Managed Care – PPO | Admitting: Pulmonary Disease

## 2011-04-22 VITALS — BP 124/86 | HR 97 | Temp 98.6°F | Ht 70.0 in | Wt 190.2 lb

## 2011-04-22 DIAGNOSIS — G4733 Obstructive sleep apnea (adult) (pediatric): Secondary | ICD-10-CM

## 2011-04-22 NOTE — Progress Notes (Signed)
  Subjective:    Patient ID: Paul Flowers, male    DOB: 10/21/1970, 40 y.o.   MRN: [id number]  HPI The patient comes in today for followup of his known sleep apnea.  He was recently started on CPAP, and his download shows an excellent compliance.  He denies any issues with his CPAP mask or pressure.  He feels that he is sleeping much better, and has increased alertness during the day.   Review of Systems  Constitutional: Negative for fever and unexpected weight change.  HENT: Negative for ear pain, nosebleeds, congestion, sore throat, rhinorrhea, sneezing, trouble swallowing, dental problem, postnasal drip and sinus pressure.   Eyes: Negative for redness and itching.  Respiratory: Negative for cough, chest tightness, shortness of breath and wheezing.   Cardiovascular: Negative for palpitations and leg swelling.  Gastrointestinal: Negative for nausea and vomiting.  Genitourinary: Negative for dysuria.  Musculoskeletal: Negative for joint swelling.  Skin: Negative for rash.  Neurological: Negative for headaches.  Hematological: Does not bruise/bleed easily.  Psychiatric/Behavioral: Negative for dysphoric mood. The patient is not nervous/anxious.        Objective:   Physical Exam Thin male in no acute distress No skin breakdown or pressure necrosis from the CPAP mask Lower extremities without edema, no cyanosis noted. Alert and oriented, does not appear sleepy, moves all 4 extremities.       Assessment & Plan:

## 2011-04-22 NOTE — Assessment & Plan Note (Signed)
The patient is doing very well with CPAP, and has excellent compliance on his download.  He denies any mask or pressure issues, and has seen a significant improvement in his sleep and daytime alertness. Care Plan:  At this point, will arrange for the patient's machine to be changed over to auto mode for 2 weeks to optimize their pressure.  I will review the downloaded data once sent by dme, and also evaluate for compliance, leaks, and residual osa.  I will call the patient and dme to discuss the results, and have the patient's machine set appropriately.  This will serve as the pt's cpap pressure titration.

## 2011-04-22 NOTE — Patient Instructions (Signed)
Will optimize your pressure for you on auto setting for next 2 weeks.  Will let you know the results. Work on modest weight loss  followup with me in 6mos.

## 2011-06-04 ENCOUNTER — Emergency Department (HOSPITAL_COMMUNITY)
Admission: EM | Admit: 2011-06-04 | Discharge: 2011-06-04 | Disposition: A | Discharge status: Home / Self Care | Payer: BC Managed Care – PPO | Attending: Emergency Medicine | Admitting: Emergency Medicine

## 2011-06-04 ENCOUNTER — Encounter (HOSPITAL_COMMUNITY): Payer: Self-pay | Admitting: *Deleted

## 2011-06-04 DIAGNOSIS — K219 Gastro-esophageal reflux disease without esophagitis: Secondary | ICD-10-CM | POA: Insufficient documentation

## 2011-06-04 DIAGNOSIS — M545 Low back pain, unspecified: Secondary | ICD-10-CM | POA: Insufficient documentation

## 2011-06-04 DIAGNOSIS — M549 Dorsalgia, unspecified: Secondary | ICD-10-CM

## 2011-06-04 MED ORDER — OXYCODONE-ACETAMINOPHEN 5-325 MG PO TABS
2.0000 | ORAL_TABLET | Freq: Once | ORAL | Status: AC
  Administered 2011-06-04: 2 via ORAL
  Filled 2011-06-04: qty 2

## 2011-06-04 MED ORDER — CYCLOBENZAPRINE HCL 10 MG PO TABS: 10.0000 mg | ORAL_TABLET | Freq: Three times a day (TID) | ORAL | Status: AC | PRN

## 2011-06-04 MED ORDER — CYCLOBENZAPRINE HCL 10 MG PO TABS: 10.0000 mg | ORAL_TABLET | Freq: Three times a day (TID) | ORAL | Status: DC | PRN

## 2011-06-04 MED ORDER — OXYCODONE-ACETAMINOPHEN 5-325 MG PO TABS: 2.0000 | ORAL_TABLET | ORAL | Status: AC | PRN

## 2011-06-04 MED ORDER — IBUPROFEN 800 MG PO TABS
800.0000 mg | ORAL_TABLET | Freq: Once | ORAL | Status: AC
  Administered 2011-06-04: 800 mg via ORAL
  Filled 2011-06-04 (×2): qty 1

## 2011-06-05 NOTE — ED Provider Notes (Signed)
History     CSN: [id number] Arrival date & time: 06/04/2011  5:30 PM   First MD Initiated Contact with Patient 06/04/11 1852      Chief Complaint  Patient presents with  . Back Pain    pt reports back pain that worsened since wednesday. pt denies recent injury or heavy lifting.    (Consider location/radiation/quality/duration/timing/severity/associated sxs/prior treatment) Patient is a 40 y.o. male presenting with back pain. The history is provided by the patient. No language interpreter was used.  Back Pain  This is a recurrent problem. The current episode started more than 2 days ago. The problem occurs constantly. The pain is associated with no known injury. The quality of the pain is described as aching. The pain does not radiate. The pain is at a severity of 7/10. The pain is moderate. The symptoms are aggravated by bending, twisting and certain positions. The pain is the same all the time. Pertinent negatives include no fever, no numbness, no headaches, no bowel incontinence, no perianal numbness, no bladder incontinence, no dysuria, no pelvic pain, no leg pain, no paresthesias, no paresis, no tingling and no weakness. He has tried analgesics and NSAIDs for the symptoms. The treatment provided mild relief.    Past Medical History  Diagnosis Date  . Allergic rhinitis   . GERD (gastroesophageal reflux disease)   . Pre-hypertension   . ALLERGIC RHINITIS 01/19/2007  . GERD 01/19/2007  . Sleep apnea 01/29/2011    History reviewed. No pertinent past surgical history.  Family History  Problem Relation Age of Onset  . Diabetes Maternal Grandmother   . Stroke Maternal Grandfather     History  Substance Use Topics  . Smoking status: Never Smoker   . Smokeless tobacco: Not on file  . Alcohol Use: Yes     occ      Review of Systems  Constitutional: Negative for fever.  Gastrointestinal: Negative for bowel incontinence.  Genitourinary: Negative for bladder incontinence,  dysuria and pelvic pain.  Musculoskeletal: Positive for back pain.  Neurological: Negative for tingling, weakness, numbness, headaches and paresthesias.  All other systems reviewed and are negative.    Allergies  Review of patient's allergies indicates no known allergies.  Home Medications   Current Outpatient Rx  Name Route Sig Dispense Refill  . ACETAMINOPHEN 325 MG PO TABS Oral Take 650 mg by mouth every 6 (six) hours as needed.      Marland Kitchen LANSOPRAZOLE 30 MG PO CPDR  TAKE 1 CAPSULE EVERY DAY AS NEEDED 30 capsule 3  . LEVOCETIRIZINE DIHYDROCHLORIDE 5 MG PO TABS Oral Take 5 mg by mouth as needed.      Marland Kitchen NAPROXEN SODIUM 220 MG PO TABS Oral Take 220 mg by mouth 2 (two) times daily with a meal.      . CYCLOBENZAPRINE HCL 10 MG PO TABS Oral Take 1 tablet (10 mg total) by mouth 3 (three) times daily as needed for muscle spasms. 10 tablet 0  . FLUTICASONE FUROATE 27.5 MCG/SPRAY NA SUSP Nasal 2 sprays by Nasal route daily. May reduce to one spray per nostril daily if the symptoms are controlled. 10 g 11  . OXYCODONE-ACETAMINOPHEN 5-325 MG PO TABS Oral Take 2 tablets by mouth every 4 (four) hours as needed for pain. 12 tablet 0    BP 159/82  Pulse 97  Temp(Src) 98.4 F (36.9 C) (Oral)  Resp 18  Wt 190 lb (86.183 kg)  SpO2 98%  Physical Exam  Nursing note and vitals reviewed. Constitutional:  He is oriented to person, place, and time. He appears well-developed and well-nourished. He appears distressed.  Eyes: Pupils are equal, round, and reactive to light.  Neck: Normal range of motion. Neck supple.  Cardiovascular: Normal rate.   Pulmonary/Chest: Effort normal.  Musculoskeletal: He exhibits tenderness. He exhibits no edema.       Bilateral lower back pain .  No spinal point tenderness  Neurological: He is alert and oriented to person, place, and time. He has normal reflexes.  Skin: Skin is warm and dry. He is not diaphoretic.  Psychiatric: He has a normal mood and affect.    ED  Course  Procedures (including critical care time)  Labs Reviewed - No data to display No results found.   1. Back pain       MDM  bilateral lower back pain with pmh of the same.  Treated in the ER with percocet and good relief.  No bowel or bladder problems.  Good reflexes.        Jethro Bastos, NP 06/05/11 1257

## 2011-06-06 NOTE — ED Provider Notes (Signed)
Medical screening examination/treatment/procedure(s) were performed by non-physician practitioner and as supervising physician I was immediately available for consultation/collaboration.  Sheretta Grumbine, MD 06/06/11 1715 

## 2011-06-15 ENCOUNTER — Ambulatory Visit: Payer: BC Managed Care – PPO | Admitting: Internal Medicine

## 2011-08-01 ENCOUNTER — Other Ambulatory Visit: Payer: Self-pay | Admitting: Pulmonary Disease

## 2011-08-01 DIAGNOSIS — G4733 Obstructive sleep apnea (adult) (pediatric): Secondary | ICD-10-CM

## 2011-09-11 ENCOUNTER — Other Ambulatory Visit: Payer: Self-pay | Admitting: Internal Medicine

## 2011-10-18 ENCOUNTER — Other Ambulatory Visit: Payer: Self-pay | Admitting: Internal Medicine

## 2011-12-03 ENCOUNTER — Other Ambulatory Visit: Payer: Self-pay | Admitting: *Deleted

## 2011-12-03 MED ORDER — LANSOPRAZOLE 30 MG PO CPDR: 30.0000 mg | DELAYED_RELEASE_CAPSULE | Freq: Every day | ORAL | Status: DC | PRN

## 2011-12-03 MED ORDER — LEVOCETIRIZINE DIHYDROCHLORIDE 5 MG PO TABS: 5.0000 mg | ORAL_TABLET | Freq: Once | ORAL | Status: DC | PRN

## 2011-12-03 NOTE — Telephone Encounter (Signed)
Refill sent to [facility]

## 2012-09-02 ENCOUNTER — Encounter: Payer: Self-pay | Admitting: Family Medicine

## 2012-09-02 ENCOUNTER — Ambulatory Visit (INDEPENDENT_AMBULATORY_CARE_PROVIDER_SITE_OTHER): Payer: BC Managed Care – PPO | Admitting: Family Medicine

## 2012-09-02 VITALS — BP 150/90 | HR 114 | Temp 98.1°F | Resp 20 | Wt 189.0 lb

## 2012-09-02 DIAGNOSIS — J019 Acute sinusitis, unspecified: Secondary | ICD-10-CM

## 2012-09-02 MED ORDER — AZITHROMYCIN 250 MG PO TABS: ORAL_TABLET | ORAL | Status: DC

## 2012-09-02 NOTE — Progress Notes (Signed)
  Subjective:    Patient ID: ROCIO WOLAK, male    DOB: 1971/01/22, 42 y.o.   MRN: [id number]  HPI Here for 3 days of sinus pressure,fever to 99.9 degrees, PND, and ST. Some dry coughing.    Review of Systems  Constitutional: Negative.   HENT: Positive for congestion, postnasal drip and sinus pressure.   Eyes: Negative.   Respiratory: Positive for cough.        Objective:   Physical Exam  Constitutional: He appears well-developed and well-nourished.  HENT:  Right Ear: External ear normal.  Left Ear: External ear normal.  Nose: Nose normal.  Mouth/Throat: Oropharynx is clear and moist.  Eyes: Conjunctivae are normal.  Pulmonary/Chest: Effort normal and breath sounds normal.  Lymphadenopathy:    He has no cervical adenopathy.          Assessment & Plan:  Add Mucinex

## 2013-02-12 ENCOUNTER — Other Ambulatory Visit (INDEPENDENT_AMBULATORY_CARE_PROVIDER_SITE_OTHER): Payer: BC Managed Care – PPO

## 2013-02-12 ENCOUNTER — Other Ambulatory Visit: Payer: Self-pay | Admitting: Internal Medicine

## 2013-02-12 ENCOUNTER — Ambulatory Visit (INDEPENDENT_AMBULATORY_CARE_PROVIDER_SITE_OTHER): Payer: BC Managed Care – PPO | Admitting: Internal Medicine

## 2013-02-12 ENCOUNTER — Encounter: Payer: Self-pay | Admitting: Internal Medicine

## 2013-02-12 VITALS — BP 130/98 | HR 68 | Temp 98.0°F | Ht 70.0 in | Wt 191.8 lb

## 2013-02-12 DIAGNOSIS — Z23 Encounter for immunization: Secondary | ICD-10-CM

## 2013-02-12 DIAGNOSIS — R030 Elevated blood-pressure reading, without diagnosis of hypertension: Secondary | ICD-10-CM

## 2013-02-12 DIAGNOSIS — Z0000 Encounter for general adult medical examination without abnormal findings: Secondary | ICD-10-CM

## 2013-02-12 DIAGNOSIS — R7989 Other specified abnormal findings of blood chemistry: Secondary | ICD-10-CM

## 2013-02-12 DIAGNOSIS — J309 Allergic rhinitis, unspecified: Secondary | ICD-10-CM

## 2013-02-12 DIAGNOSIS — K219 Gastro-esophageal reflux disease without esophagitis: Secondary | ICD-10-CM

## 2013-02-12 DIAGNOSIS — G4733 Obstructive sleep apnea (adult) (pediatric): Secondary | ICD-10-CM

## 2013-02-12 DIAGNOSIS — Z125 Encounter for screening for malignant neoplasm of prostate: Secondary | ICD-10-CM

## 2013-02-12 DIAGNOSIS — R899 Unspecified abnormal finding in specimens from other organs, systems and tissues: Secondary | ICD-10-CM

## 2013-02-12 LAB — COMPREHENSIVE METABOLIC PANEL
ALT: 31 U/L (ref 0–53)
AST: 27 U/L (ref 0–37)
CO2: 26 mEq/L (ref 19–32)
Calcium: 9.4 mg/dL (ref 8.4–10.5)
Chloride: 102 mEq/L (ref 96–112)
Creatinine, Ser: 0.7 mg/dL (ref 0.4–1.5)
Sodium: 137 mEq/L (ref 135–145)
Total Protein: 7.3 g/dL (ref 6.0–8.3)

## 2013-02-12 LAB — HEPATIC FUNCTION PANEL
AST: 27 U/L (ref 0–37)
Albumin: 4 g/dL (ref 3.5–5.2)
Total Bilirubin: 1.6 mg/dL — ABNORMAL HIGH (ref 0.3–1.2)

## 2013-02-12 LAB — HEMOGLOBIN AND HEMATOCRIT, BLOOD
HCT: 44.2 % (ref 39.0–52.0)
Hemoglobin: 14.8 g/dL (ref 13.0–17.0)

## 2013-02-12 LAB — LIPID PANEL
HDL: 53 mg/dL (ref >39.00)
VLDL: 20.6 mg/dL (ref 0.0–40.0)

## 2013-02-12 LAB — PSA: PSA: 0.34 ng/mL (ref 0.10–4.00)

## 2013-02-12 LAB — LDL CHOLESTEROL, DIRECT: Direct LDL: 149.3 mg/dL

## 2013-02-12 LAB — TSH: TSH: 0.6 u[IU]/mL (ref 0.35–5.50)

## 2013-02-12 NOTE — Assessment & Plan Note (Signed)
Has resumed using CPAP. Has not had follow up with sleep medicine or a titration study for 2+ years  Plan Referral to Dr. Shelle Iron.

## 2013-02-12 NOTE — Patient Instructions (Addendum)
Good to see you. All looks good. Hopefully the sesmoiditis will not return.  Diet management: smart food choices, PORTION SIZE CONTROL, regular exercise. Goal - to loose 1-2 lbs.month. Target weight -175lbs  For routine lab today - results by MyChart  Immunization today - Tdap, good for 10 years.  See you for a routine exam in 2 years, sooner as needed.

## 2013-02-12 NOTE — Assessment & Plan Note (Signed)
BP Readings from Last 3 Encounters:  02/12/13 130/98  09/02/12 150/90  06/04/11 159/82   Not to a level that requires medical treatment.  Plan Life-style: loose weight, exercise

## 2013-02-12 NOTE — Progress Notes (Signed)
Subjective:    Patient ID: DELOY ARCHEY, male    DOB: [birth year], 42 y.o.   MRN: [id number]  HPI Mr. Leffler presents for a routine wellness exam. His history has been benign but he did have sesmoiditis left foot - he wore a boot for 3 months on the left foot. He is doing: he uses orthotics at home. He has resumed running. He has a birthday coming up and he is worried about his weight.   Past Medical History  Diagnosis Date  . Allergic rhinitis   . GERD (gastroesophageal reflux disease)   . Pre-hypertension   . ALLERGIC RHINITIS 01/19/2007  . GERD 01/19/2007  . Sleep apnea 01/29/2011   History reviewed. No pertinent past surgical history. Family History  Problem Relation Age of Onset  . Diabetes Maternal Grandmother   . Stroke Maternal Grandfather    History   Social History  . Marital Status: Legally Separated    Spouse Name: N/A    Number of Children: N/A  . Years of Education: N/A   Occupational History  . Vehicle Operations Assistant [employer]   Social History Main Topics  . Smoking status: Never Smoker   . Smokeless tobacco: Not on file  . Alcohol Use: Yes     Comment: occ  . Drug Use: No  . Sexually Active: Not on file   Other Topics Concern  . 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  HEENT:  Negative for hearing loss, ear pain, congestion, neck stiffness and postnasal drip. Negative for sore throat or swallowing problems. Negative for dental complaints.   Eyes: Negative for vision loss or change in visual acuity.  Respiratory: Negative for chest tightness and wheezing. Negative for DOE.   Cardiovascular: Negative for chest  pain or palpitations. No decreased exercise tolerance Gastrointestinal: No change in bowel habit. No bloating or gas. No reflux or indigestion Genitourinary: Negative for urgency, frequency, flank pain and difficulty urinating.  Musculoskeletal: Negative for myalgias, back pain, arthralgias and gait problem.  Neurological: Negative for dizziness, tremors, weakness and headaches.  Hematological: Negative for adenopathy.  Psychiatric/Behavioral: Negative for behavioral problems and dysphoric mood.       Objective:   Physical Exam Filed Vitals:   02/12/13 1350  BP: 130/98  Pulse: 68  Temp: 98 F (36.7 C)   Wt Readings from Last 3 Encounters:  02/12/13 191 lb 12.8 oz (87 kg)  09/02/12 189 lb (85.73 kg)  06/04/11 190 lb (86.183 kg)   Gen'l: Well nourished well developed male in no acute distress  HEENT: Head: Normocephalic and atraumatic. Right Ear: External ear normal. EAC w/ non-obstructing wax/TM nl. Left Ear: External ear normal.  EAC/TM nl. Nose: Nose normal. Mouth/Throat: Oropharynx is clear and moist. Dentition - native, in good repair. No buccal or palatal lesions. Posterior pharynx clear. Eyes: Conjunctivae and sclera clear. EOM intact. Pupils are equal, round, and reactive to light. Right eye exhibits no discharge. Left eye exhibits no discharge. Neck: Normal range of motion. Neck supple. No JVD present. No tracheal deviation present. No thyromegaly present.  Cardiovascular:  Normal rate, regular rhythm, no gallop, no friction rub, no murmur heard.      Quiet precordium. 2+ radial and DP pulses . No carotid bruits Pulmonary/Chest: Effort normal. No respiratory distress or increased WOB, no wheezes, no rales. No chest wall deformity or CVAT. Abdomen: Soft. Bowel sounds are normal in all quadrants. He exhibits no distension, no tenderness, no rebound or guarding, No heptosplenomegaly  Genitourinary:  deferred. PSA ordered Musculoskeletal: Normal range of motion. He exhibits no edema  and no tenderness.       Small and large joints without redness, synovial thickening or deformity. Full range of motion preserved about all small, median and large joints.  Lymphadenopathy:    He has no cervical or supraclavicular adenopathy.  Neurological: He is alert and oriented to person, place, and time. CN II-XII intact. DTRs 2+ and symmetrical biceps, radial and patellar tendons. Cerebellar function normal with no tremor, rigidity, normal gait and station.  Skin: Skin is warm and dry. No rash noted. No erythema.  Psychiatric: He has a normal mood and affect. His behavior is normal. Thought content normal.   Recent Results (from the past 2160 hour(s))  HEPATIC FUNCTION PANEL     Status: Abnormal   Collection Time    02/12/13  2:38 PM      Result Value Range   Total Bilirubin 1.6 (*) 0.3 - 1.2 mg/dL   Bilirubin, Direct 0.2  0.0 - 0.3 mg/dL   Alkaline Phosphatase 54  39 - 117 U/L   AST 27  0 - 37 U/L   ALT 31  0 - 53 U/L   Total Protein 7.3  6.0 - 8.3 g/dL   Albumin 4.0  3.5 - 5.2 g/dL  TSH     Status: None   Collection Time    02/12/13  2:38 PM      Result Value Range   TSH 0.60  0.35 - 5.50 uIU/mL  COMPREHENSIVE METABOLIC PANEL     Status: Abnormal   Collection Time    02/12/13  2:38 PM      Result Value Range   Sodium 137  135 - 145 mEq/L   Potassium 4.1  3.5 - 5.1 mEq/L   Chloride 102  96 - 112 mEq/L   CO2 26  19 - 32 mEq/L   Glucose, Bld 93  70 - 99 mg/dL   BUN 6  6 - 23 mg/dL   Creatinine, Ser 0.7  0.4 - 1.5 mg/dL   Total Bilirubin 1.6 (*) 0.3 - 1.2 mg/dL   Alkaline Phosphatase 54  39 - 117 U/L   AST 27  0 - 37 U/L   ALT 31  0 - 53 U/L   Total Protein 7.3  6.0 - 8.3 g/dL   Albumin 4.0  3.5 - 5.2 g/dL   Calcium 9.4  8.4 - 16.1 mg/dL   GFR 096.04  >54.09 mL/min  LIPID PANEL     Status: Abnormal   Collection Time    02/12/13  2:38 PM      Result Value Range   Cholesterol 214 (*) 0 - 200 mg/dL   Comment: ATP III Classification       Desirable:  < 200 mg/dL                Borderline High:  200 - 239 mg/dL          High:  > = 811 mg/dL   Triglycerides 914.7  0.0 - 149.0 mg/dL  Comment: Normal:  <150 mg/dLBorderline High:  150 - 199 mg/dL   HDL 16.10  >96.04 mg/dL   VLDL 54.0  0.0 - 98.1 mg/dL   Total CHOL/HDL Ratio 4     Comment:                Men          Women1/2 Average Risk     3.4          3.3Average Risk          5.0          4.42X Average Risk          9.6          7.13X Average Risk          15.0          11.0                      HEMOGLOBIN AND HEMATOCRIT, BLOOD     Status: None   Collection Time    02/12/13  2:38 PM      Result Value Range   Hemoglobin 14.8  13.0 - 17.0 g/dL   HCT 19.1  47.8 - 29.5 %  PSA     Status: None   Collection Time    02/12/13  2:38 PM      Result Value Range   PSA 0.34  0.10 - 4.00 ng/mL  LDL CHOLESTEROL, DIRECT     Status: None   Collection Time    02/12/13  2:38 PM      Result Value Range   Direct LDL 149.3     Comment: Optimal:  <100 mg/dLNear or Above Optimal:  100-129 mg/dLBorderline High:  130-159 mg/dLHigh:  160-189 mg/dLVery High:  >190 mg/dL         Assessment & Plan:

## 2013-02-12 NOTE — Assessment & Plan Note (Signed)
Stable on PPI therapy.

## 2013-02-12 NOTE — Assessment & Plan Note (Signed)
Stable. Not using Veramyst but takes Xyzal daily.

## 2013-02-12 NOTE — Assessment & Plan Note (Addendum)
Interval history benign. Physical exam normal. Lab pending. Discussed pros and cons of prostate cancer screening (USPHCTF recommendations reviewed and ACU April '13 recommendations and he requests evaluation at this time - PSA ordered. Immunization brought up to date.  In summary - a healthy appearing man who will work on weight loss to a goal of 175 lbs. He will return for general exam in 2 years, sooner as needed.

## 2013-03-16 ENCOUNTER — Ambulatory Visit (INDEPENDENT_AMBULATORY_CARE_PROVIDER_SITE_OTHER): Payer: BC Managed Care – PPO | Admitting: Pulmonary Disease

## 2013-03-16 ENCOUNTER — Encounter: Payer: Self-pay | Admitting: Pulmonary Disease

## 2013-03-16 VITALS — BP 138/100 | HR 95 | Temp 98.4°F | Ht 70.0 in | Wt 191.6 lb

## 2013-03-16 DIAGNOSIS — G4733 Obstructive sleep apnea (adult) (pediatric): Secondary | ICD-10-CM

## 2013-03-16 NOTE — Progress Notes (Signed)
  Subjective:    Patient ID: Paul Flowers, male    DOB: Dec 22, 1970, 42 y.o.   MRN: [id number]  HPI The patient comes in today for followup of his obstructive sleep apnea.  I have not seen him in approximately 2 years, and he admits that he went a period of time without wearing the device.  He is now back on CPAP for the last few months, and clearly sees a difference in his sleep and daytime alertness.  However, he is using an old CPAP mask that needs to be replaced.  We had ordered his CPAP machine changed to a fixed pressure of 14 cm, however he has decided to stay on the automatic setting.  His weight has been stable since last visit.   Review of Systems  Constitutional: Negative for fever and unexpected weight change.  HENT: Negative for ear pain, nosebleeds, congestion, sore throat, rhinorrhea, sneezing, trouble swallowing, dental problem, postnasal drip and sinus pressure.   Eyes: Negative for redness and itching.  Respiratory: Negative for cough, chest tightness, shortness of breath and wheezing.   Cardiovascular: Negative for palpitations and leg swelling.  Gastrointestinal: Negative for nausea and vomiting.       Acid heartburn  Genitourinary: Negative for dysuria.  Musculoskeletal: Negative for joint swelling.  Skin: Negative for rash.  Neurological: Negative for headaches.  Hematological: Does not bruise/bleed easily.  Psychiatric/Behavioral: Negative for dysphoric mood. The patient is not nervous/anxious.        Objective:   Physical Exam Well-developed male in no acute distress Nose without purulence or discharge noted No skin breakdown or pressure necrosis from the CPAP mask Neck without lymphadenopathy or thyromegaly Lower extremities without edema, no cyanosis Alert and oriented, doesn't appear to be sleepy, moves all 4 extremities       Assessment & Plan:

## 2013-03-16 NOTE — Assessment & Plan Note (Signed)
The patient went a period of time with noncompliance with his CPAP device, but the last few months has gotten back on his machine.  He clearly sees a difference in his sleep and his daytime alertness when he wears the device.  He is overdue for a new CPAP mask, and is currently staying on the automatic setting by his download.  He has good control of his events on the current pressure setting.  I have also encouraged him to work on modest weight loss.

## 2013-03-16 NOTE — Patient Instructions (Addendum)
Will get you a new mask for your cpap. Wear cpap more consistently, and let us know if having tolerance issues. Work on modest weight loss followup with me in one year.

## 2013-03-26 ENCOUNTER — Other Ambulatory Visit: Payer: Self-pay | Admitting: Internal Medicine

## 2013-05-10 ENCOUNTER — Other Ambulatory Visit: Payer: Self-pay

## 2013-09-17 ENCOUNTER — Other Ambulatory Visit: Payer: Self-pay | Admitting: Internal Medicine

## 2013-11-06 ENCOUNTER — Encounter (HOSPITAL_COMMUNITY): Payer: Self-pay | Admitting: Emergency Medicine

## 2013-11-06 ENCOUNTER — Emergency Department (HOSPITAL_COMMUNITY)
Admission: EM | Admit: 2013-11-06 | Discharge: 2013-11-06 | Disposition: A | Discharge status: Home / Self Care | Payer: BC Managed Care – PPO | Attending: Emergency Medicine | Admitting: Emergency Medicine

## 2013-11-06 ENCOUNTER — Emergency Department (HOSPITAL_COMMUNITY): Payer: BC Managed Care – PPO

## 2013-11-06 DIAGNOSIS — K219 Gastro-esophageal reflux disease without esophagitis: Secondary | ICD-10-CM | POA: Insufficient documentation

## 2013-11-06 DIAGNOSIS — R079 Chest pain, unspecified: Secondary | ICD-10-CM

## 2013-11-06 DIAGNOSIS — IMO0002 Reserved for concepts with insufficient information to code with codable children: Secondary | ICD-10-CM | POA: Insufficient documentation

## 2013-11-06 DIAGNOSIS — Z8709 Personal history of other diseases of the respiratory system: Secondary | ICD-10-CM | POA: Insufficient documentation

## 2013-11-06 DIAGNOSIS — R0602 Shortness of breath: Secondary | ICD-10-CM | POA: Insufficient documentation

## 2013-11-06 DIAGNOSIS — R61 Generalized hyperhidrosis: Secondary | ICD-10-CM | POA: Insufficient documentation

## 2013-11-06 DIAGNOSIS — I10 Essential (primary) hypertension: Secondary | ICD-10-CM | POA: Insufficient documentation

## 2013-11-06 DIAGNOSIS — Z8669 Personal history of other diseases of the nervous system and sense organs: Secondary | ICD-10-CM | POA: Insufficient documentation

## 2013-11-06 DIAGNOSIS — Z79899 Other long term (current) drug therapy: Secondary | ICD-10-CM | POA: Insufficient documentation

## 2013-11-06 LAB — I-STAT CHEM 8, ED
BUN: 4 mg/dL — ABNORMAL LOW (ref 6–23)
CREATININE: 0.8 mg/dL (ref 0.50–1.35)
Calcium, Ion: 1.19 mmol/L (ref 1.12–1.23)
Chloride: 105 mEq/L (ref 96–112)
GLUCOSE: 99 mg/dL (ref 70–99)
HCT: 42 % (ref 39.0–52.0)
HEMOGLOBIN: 14.3 g/dL (ref 13.0–17.0)
Potassium: 3.8 mEq/L (ref 3.7–5.3)
SODIUM: 142 meq/L (ref 137–147)
TCO2: 26 mmol/L (ref 0–100)

## 2013-11-06 LAB — CBC
HCT: 41 % (ref 39.0–52.0)
Hemoglobin: 14.2 g/dL (ref 13.0–17.0)
MCH: 29.2 pg (ref 26.0–34.0)
MCHC: 34.6 g/dL (ref 30.0–36.0)
MCV: 84.4 fL (ref 78.0–100.0)
Platelets: 290 10*3/uL (ref 150–400)
RBC: 4.86 MIL/uL (ref 4.22–5.81)
RDW: 12.3 % (ref 11.5–15.5)
WBC: 6.6 10*3/uL (ref 4.0–10.5)

## 2013-11-06 LAB — I-STAT TROPONIN, ED
TROPONIN I, POC: 0 ng/mL (ref 0.00–0.08)
Troponin i, poc: 0 ng/mL (ref 0.00–0.08)

## 2013-11-06 MED ORDER — GI COCKTAIL ~~LOC~~
30.0000 mL | Freq: Once | ORAL | Status: AC
  Administered 2013-11-06: 30 mL via ORAL
  Filled 2013-11-06: qty 30

## 2013-11-06 MED ORDER — MORPHINE SULFATE 4 MG/ML IJ SOLN
4.0000 mg | Freq: Once | INTRAMUSCULAR | Status: AC
  Administered 2013-11-06: 4 mg via INTRAVENOUS
  Filled 2013-11-06: qty 1

## 2013-11-06 MED ORDER — ONDANSETRON HCL 4 MG/2ML IJ SOLN
4.0000 mg | Freq: Once | INTRAMUSCULAR | Status: AC
  Administered 2013-11-06: 4 mg via INTRAVENOUS
  Filled 2013-11-06: qty 2

## 2013-11-06 MED ORDER — SODIUM CHLORIDE 0.9 % IV BOLUS (SEPSIS)
1000.0000 mL | Freq: Once | INTRAVENOUS | Status: AC
  Administered 2013-11-06: 1000 mL via INTRAVENOUS

## 2013-11-06 NOTE — ED Notes (Signed)
MD at bedside to re-evaluate.

## 2013-11-06 NOTE — ED Provider Notes (Signed)
CSN: [id number]     Arrival date & time 11/06/13  1507 History   First MD Initiated Contact with Patient 11/06/13 1510     Chief Complaint  Patient presents with  . Chest Pain     (Consider location/radiation/quality/duration/timing/severity/associated sxs/prior Treatment) The history is provided by the patient.  HISTORY OF PRESENT ILLNESS: 43 yo male with history of GERD who presents for chest pain.  Onset of symptoms 1 hour prior to arrival.  Symptoms started shortly after eating lunch, but patient feels this is different from his usual GERD.  Pt reports central "sharp and stabbing" chest pain that radiated to the left arm.  He also felt "clammy" and mildly short of breath.  Pt given ASA 324 mg and Nitro SL with EMS.  No longer feels "clammy" or short of breath, but has had minimal improvement in chest pain.  No recent travel, lower extremity edema or swelling, surgery, immobilization.  Past Medical History  Diagnosis Date  . Allergic rhinitis   . GERD (gastroesophageal reflux disease)   . Pre-hypertension   . ALLERGIC RHINITIS 01/19/2007  . GERD 01/19/2007  . Sleep apnea 01/29/2011   History reviewed. No pertinent past surgical history. Family History  Problem Relation Age of Onset  . Diabetes Maternal Grandmother   . Stroke Maternal Grandfather    History  Substance Use Topics  . Smoking status: Never Smoker   . Smokeless tobacco: Not on file  . Alcohol Use: Yes     Comment: occ    Review of Systems  Constitutional: Positive for diaphoresis. Negative for fever and chills.  HENT: Negative.   Eyes: Negative.   Respiratory: Negative for cough and shortness of breath.   Cardiovascular: Positive for chest pain. Negative for palpitations and leg swelling.  Gastrointestinal: Negative for nausea, vomiting, abdominal pain, diarrhea and constipation.  Genitourinary: Negative.   Musculoskeletal: Negative.   Skin: Negative.   Neurological: Negative.   All other systems reviewed and  are negative.     Allergies  Review of patient's allergies indicates no known allergies.  Home Medications   Prior to Admission medications   Medication Sig Start Date End Date Taking? Authorizing Provider  acetaminophen (TYLENOL) 325 MG tablet Take 650 mg by mouth every 6 (six) hours as needed.      Historical Provider, MD  lansoprazole (PREVACID) 30 MG capsule Take 1 capsule (30 mg total) by mouth daily as needed. 12/03/11   Neena Rhymes, MD  lansoprazole (PREVACID) 30 MG capsule TAKE 1 CAPSULE EVERY DAY AS NEEDED 03/26/13   Neena Rhymes, MD  levocetirizine (XYZAL) 5 MG tablet Take 1 tablet (5 mg total) by mouth once as needed for allergies. 12/03/11   Neena Rhymes, MD  levocetirizine (XYZAL) 5 MG tablet TAKE 1 TABLET BY MOUTH ONCE A DAY AS NEEDED 03/26/13   Neena Rhymes, MD  VERAMYST 27.5 MCG/SPRAY nasal spray USE 2 SPRAYS IN EACH NOSTRIL.MAY REDUCE TO 1 SPRAY PER NOSTRIL DAILY IF SYMPTOMS ARE CONTROLLED 09/17/13   Neena Rhymes, MD   BP 117/82  Pulse 84  Temp(Src) 97.4 F (36.3 C) (Oral)  Resp 18  Wt 192 lb (87.091 kg)  SpO2 97% Physical Exam  Nursing note and vitals reviewed. Constitutional: He is oriented to person, place, and time. He appears well-developed and well-nourished. No distress.  HENT:  Head: Normocephalic and atraumatic.  Eyes: Conjunctivae are normal.  Neck: Neck supple.  Cardiovascular: Normal rate, regular rhythm, normal heart sounds and intact distal  pulses.   Pulmonary/Chest: Effort normal and breath sounds normal. He has no wheezes. He has no rales.  Abdominal: Soft. He exhibits no distension. There is no tenderness.  Musculoskeletal: Normal range of motion. He exhibits no edema.  Neurological: He is alert and oriented to person, place, and time.  Skin: Skin is warm and dry.    ED Course  Procedures (including critical care time) Labs Review Labs Reviewed  I-STAT CHEM 8, ED - Abnormal; Notable for the following:    BUN 4 (*)    All  other components within normal limits  CBC  I-STAT TROPOININ, ED  Randolm Idol, ED    Imaging Review Dg Chest 2 View  11/06/2013   CLINICAL DATA:  Chest pain, short of breath  EXAM: CHEST  2 VIEW  COMPARISON:  DG CHEST 2 VIEW dated 03/25/2010  FINDINGS: Normal mediastinum and cardiac silhouette. Normal pulmonary vasculature. No evidence of effusion, infiltrate, or pneumothorax. No acute bony abnormality.  IMPRESSION: No acute cardiopulmonary process.   Electronically Signed   By: Suzy Bouchard M.D.   On: 11/06/2013 16:06     EKG Interpretation   Date/Time:  Tuesday Nov 06 2013 15:13:08 EDT Ventricular Rate:  91 PR Interval:  156 QRS Duration: 86 QT Interval:  352 QTC Calculation: 432 R Axis:   26 Text Interpretation:  Normal sinus rhythm Nonspecific T wave abnormality  Abnormal ECG T wave flattening in lateral leads Confirmed by HORTON  MD,  Loma Sousa ([id number]) on 11/06/2013 3:16:14 PM      MDM   Final diagnoses:  Chest pain    43 year old male with history of pre-hypertension who presents for chest pain described as sharp and stabbing. Onset of symptoms was approximately one hour after eating lunch. AF VSS. Exam remarkable only for chest pain with AP compression of the chest that reproduced the patient's symptoms.  Concern for possible ACS or GI etiology. Patient PERC negative and doubt PE. EKG with nonspecific T wave changes, otherwise normal EKG. Heart score of 2. Patient low risk and if initial troponin negative will plan for delta troponin.  Patient symptoms resolved in the emergency department. Troponin negative x2. Feel patient appropriate for discharge home with PCP and cardiology followup. Treatment plan was discussed with the patient and spouse and return precautions were given.   Renaldo Reel, MD 11/07/13 [phone]

## 2013-11-06 NOTE — ED Provider Notes (Addendum)
I saw and evaluated the patient, reviewed the resident's note and I agree with the findings and plan.   EKG Interpretation   Date/Time:  Tuesday Nov 06 2013 15:13:08 EDT Ventricular Rate:  91 PR Interval:  156 QRS Duration: 86 QT Interval:  352 QTC Calculation: 432 R Axis:   26 Text Interpretation:  Normal sinus rhythm Nonspecific T wave abnormality  Abnormal ECG T wave flattening in lateral leads Confirmed by Dezhane Staten  MD,  Krysteena Stalker ([id number]) on 11/06/2013 3:16:14 PM      Gen:  AOx3 HEENT: Normocephalic, atraumatic Card:  RRR, no m/r/g Pulm: CTAB Abd: Soft nontender  Patient presents with chest pain. Chest pain after eating lunch. No exertional component; however, patient does endorse shortness of breath and diaphoresis with the episode. He is currently chest pain-free after morphine. EKG is nonischemic with some T-wave flattening laterally. He is low risk for ACS. Will obtain 6 hour troponin. If negative patient will be discharged with close cardiology followup.    Merryl Hacker, MD 11/06/13 [place], MD 11/07/13 [phone]

## 2013-11-06 NOTE — ED Notes (Signed)
To ED via EMS for eval of CP. Started about an hour pta. Described as pressure, intermittent, stabbing. Ate a hotdog for lunch with hx of GERD

## 2013-11-06 NOTE — ED Notes (Signed)
Pt in radiology 

## 2013-11-06 NOTE — Discharge Instructions (Signed)

## 2013-11-07 NOTE — ED Provider Notes (Signed)
I saw and evaluated the patient, reviewed the resident's note and I agree with the findings and plan.   EKG Interpretation   Date/Time:  Tuesday Nov 06 2013 15:13:08 EDT Ventricular Rate:  91 PR Interval:  156 QRS Duration: 86 QT Interval:  352 QTC Calculation: 432 R Axis:   26 Text Interpretation:  Normal sinus rhythm Nonspecific T wave abnormality  Abnormal ECG T wave flattening in lateral leads Confirmed by Konstantin Lehnen  MD,  [place] ([id number]) on 11/06/2013 3:16:14 PM     See separate note.  Merryl Hacker, MD 11/07/13 1536

## 2013-11-09 ENCOUNTER — Encounter: Payer: Self-pay | Admitting: Cardiovascular Disease

## 2013-11-09 ENCOUNTER — Ambulatory Visit (INDEPENDENT_AMBULATORY_CARE_PROVIDER_SITE_OTHER): Payer: BC Managed Care – PPO | Admitting: Cardiovascular Disease

## 2013-11-09 VITALS — BP 144/99 | HR 83 | Ht 70.0 in | Wt 186.0 lb

## 2013-11-09 DIAGNOSIS — R079 Chest pain, unspecified: Secondary | ICD-10-CM

## 2013-11-09 DIAGNOSIS — I10 Essential (primary) hypertension: Secondary | ICD-10-CM

## 2013-11-09 DIAGNOSIS — E1159 Type 2 diabetes mellitus with other circulatory complications: Secondary | ICD-10-CM | POA: Insufficient documentation

## 2013-11-09 DIAGNOSIS — I152 Hypertension secondary to endocrine disorders: Secondary | ICD-10-CM | POA: Insufficient documentation

## 2013-11-09 NOTE — Assessment & Plan Note (Signed)
He will get BP cuff and monitor at home f/u next available to go over this and see if Rx needed Discussed low sodium diet and limiting ETOH and NSAI's

## 2013-11-09 NOTE — Assessment & Plan Note (Signed)
Resolved GI overtones  Family history and ? unRx HTN  Nonspecific ST/T wave changes  F/U stress myovue

## 2013-11-09 NOTE — Assessment & Plan Note (Signed)
Discussed diet modifications and prevacid  F/u with primary

## 2013-11-09 NOTE — Patient Instructions (Signed)
Your physician recommends that you schedule a follow-up appointment in: [place] Your physician recommends that you continue on your current medications as directed. Please refer to the Current Medication list given to you today. Your physician has requested that you have en exercise stress myoview. For further information please visit [email or web address]. Please follow instruction sheet, as given.

## 2013-11-09 NOTE — Progress Notes (Signed)
Patient ID: Paul Flowers, male   DOB: 09/04/1970, 43 y.o.   MRN: [id number]   43 yo seen in ER with atypical chest pain 5/5  Pain started after eating lunch. Has history of GERD Onset of symptoms 1 hour prior to arrival. Symptoms started shortly after eating lunch, but patient feels this is different from his usual GERD. Pt reports central "sharp and stabbing" chest pain that radiated to the left arm. He also felt "clammy" and mildly short of breath. Pt given ASA 324 mg and Nitro SL with EMS. No longer feels "clammy" or short of breath, but has had minimal improvement in chest pain. No recent travel, lower extremity edema or swelling, surgery, immobilization.  Pain was not exertional  In ER telemtry normal , R/O normal CXR and ECG with nonspecific ST/T wave changes   Thinks it was esophageal spasm  Had not been taking his prevacid.  No issues since d/c  BP has been borderline but not on meds  Had seen Dr Crissie Sickles for primary but needs new one      ROS: Denies fever, malais, weight loss, blurry vision, decreased visual acuity, cough, sputum, SOB, hemoptysis, pleuritic pain, palpitaitons, heartburn, abdominal pain, melena, lower extremity edema, claudication, or rash.  All other systems reviewed and negative   General: Affect appropriate Healthy:  appears stated age 1: normal Neck supple with no adenopathy JVP normal no bruits no thyromegaly Lungs clear with no wheezing and good diaphragmatic motion Heart:  S1/S2 no murmur,rub, gallop or click PMI normal Abdomen: benighn, BS positve, no tenderness, no AAA no bruit.  No HSM or HJR Distal pulses intact with no bruits No edema Neuro non-focal Skin warm and dry No muscular weakness  Medications Current Outpatient Prescriptions  Medication Sig Dispense Refill  . Aspirin-Acetaminophen-Caffeine (GOODY HEADACHE PO) Take 1 packet by mouth daily as needed (for pain).      . fluticasone (VERAMYST) 27.5 MCG/SPRAY nasal spray Place 1 spray into  both nostrils daily as needed for allergies.      Marland Kitchen lansoprazole (PREVACID) 30 MG capsule Take 30 mg by mouth daily as needed (for indigestion).      Marland Kitchen levocetirizine (XYZAL) 5 MG tablet Take 5 mg by mouth daily as needed for allergies.       No current facility-administered medications for this visit.    Allergies Review of patient's allergies indicates no known allergies.  Family History: Family History  Problem Relation Age of Onset  . Diabetes Maternal Grandmother   . Stroke Maternal Grandfather     Social History: History   Social History  . Marital Status: Legally Separated    Spouse Name: N/A    Number of Children: N/A  . Years of Education: N/A   Occupational History  . Vehicle Operations Assistant [employer]   Social History Main Topics  . Smoking status: Never Smoker   . Smokeless tobacco: Not on file  . Alcohol Use: Yes     Comment: occ  . Drug Use: No  . Sexual Activity: Not on file   Other Topics Concern  . 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

## 2013-11-20 ENCOUNTER — Other Ambulatory Visit: Payer: Self-pay | Admitting: *Deleted

## 2013-11-20 MED ORDER — LANSOPRAZOLE 30 MG PO CPDR: 30.0000 mg | DELAYED_RELEASE_CAPSULE | Freq: Every day | ORAL | Status: DC | PRN

## 2013-11-28 ENCOUNTER — Ambulatory Visit (HOSPITAL_COMMUNITY): Payer: BC Managed Care – PPO | Attending: Cardiovascular Disease | Admitting: Radiology

## 2013-11-28 VITALS — BP 137/61 | HR 72 | Ht 70.0 in | Wt 189.0 lb

## 2013-11-28 DIAGNOSIS — R079 Chest pain, unspecified: Secondary | ICD-10-CM | POA: Insufficient documentation

## 2013-11-28 DIAGNOSIS — R0602 Shortness of breath: Secondary | ICD-10-CM | POA: Insufficient documentation

## 2013-11-28 MED ORDER — TECHNETIUM TC 99M SESTAMIBI GENERIC - CARDIOLITE
33.0000 | Freq: Once | INTRAVENOUS | Status: AC | PRN
  Administered 2013-11-28: 33 via INTRAVENOUS

## 2013-11-28 MED ORDER — TECHNETIUM TC 99M SESTAMIBI GENERIC - CARDIOLITE
11.0000 | Freq: Once | INTRAVENOUS | Status: AC | PRN
  Administered 2013-11-28: 11 via INTRAVENOUS

## 2013-11-28 NOTE — Progress Notes (Signed)
[facility] [address] [place],  [address] [phone]    Cardiology Nuclear Med Study  [name] is a 43 y.o. male     MRN : [id number]     DOB: Apr 09, 1971  Procedure Date: 11/28/2013  Nuclear Med Background Indication for Stress Test:  Evaluation for Ischemia, [facility] 5/15 CP, R/O MI and Abnormal EKG History:  No known CAD Cardiac Risk Factors: Hypertension  Symptoms:  Chest Pain and SOB   Nuclear Pre-Procedure Caffeine/Decaff Intake:  None NPO After: 7:30pm   Lungs:  clear O2 Sat: 96% on room air. IV 0.9% NS with Angio Cath:  22g  IV Site: R Hand  IV Started by:  Crissie Figures, RN  Chest Size (in):  46 Cup Size: n/a  Height: 5\' 10"  (1.778 m)  Weight:  189 lb (85.73 kg)  BMI:  Body mass index is 27.12 kg/(m^2). Tech Comments:  N/A    Nuclear Med Study 1 or 2 day study: 1 day  Stress Test Type:  Stress  Reading MD: N/A  Order Authorizing Provider:  Jenkins Rouge, MD  Resting Radionuclide: Technetium 83m Sestamibi  Resting Radionuclide Dose: 11.0 mCi   Stress Radionuclide:  Technetium 80m Sestamibi  Stress Radionuclide Dose: 33.0 mCi           Stress Protocol Rest HR: 72 Stress HR: 157  Rest BP: 137/61 Stress BP: 184/91  Exercise Time (min): 9:00 METS: 10.1    Dose of Adenosine (mg):  n/a Dose of Lexiscan: n/a mg  Dose of Atropine (mg): n/a Dose of Dobutamine: n/a mcg/kg/min (at max HR)  Stress Test Technologist: Glade Lloyd, BS-ES  Nuclear Technologist:  Annye Rusk, CNMT     Rest Procedure:  Myocardial perfusion imaging was performed at rest 45 minutes following the intravenous administration of Technetium 70m Sestamibi. Rest ECG: NSR - Normal EKG  Stress Procedure:  The patient exercised on the treadmill utilizing the Bruce Protocol for 9:00 minutes. The patient stopped due to fatigue and denied any chest pain.  Technetium 47m Sestamibi was injected at peak exercise and myocardial perfusion imaging was performed  after a brief delay. Stress ECG: No significant change from baseline ECG  QPS Raw Data Images:  There is interference from nuclear activity from structures below the diaphragm that affects the ability to read the study. Stress Images:  There is perfusion defect in the entire inferior wall.  Rest Images:  Normal homogeneous uptake in all areas of the myocardium. Subtraction (SDS):  Medium size, moderate severity reversible defect in the inferior wall.  Transient Ischemic Dilatation (Normal <1.22):  0.84 Lung/Heart Ratio (Normal <0.45):  0.30  Quantitative Gated Spect Images QGS EDV:  88 ml QGS ESV:  43 ml  Impression Exercise Capacity:  Excellent exercise capacity. BP Response:  Hypertensive blood pressure response. Clinical Symptoms:  No significant symptoms noted. ECG Impression:  No significant ST segment change suggestive of ischemia. Comparison with Prior Nuclear Study: No previous nuclear study performed  Overall Impression:  Intermediate risk stress nuclear study with medium size, moderate severity reversible defect in the inferior wall. There is significant extracardiac activity that might be affecting the study interpretation.   LV Ejection Fraction: 52%.  LV Wall Motion:  NL LV Function; NL Wall Motion.  Paul Flowers 11/28/2013

## 2013-11-30 ENCOUNTER — Telehealth: Payer: Self-pay

## 2013-11-30 DIAGNOSIS — Z01810 Encounter for preprocedural cardiovascular examination: Secondary | ICD-10-CM

## 2013-11-30 DIAGNOSIS — R079 Chest pain, unspecified: Secondary | ICD-10-CM

## 2013-11-30 NOTE — Telephone Encounter (Signed)
**Note De-Identified Aretta Stetzel Obfuscation** Message copied by Kerin Kren, Deliah Boston on Fri Nov 30, 2013  9:10 AM ------      Message from: Josue Hector      Created: Thu Nov 29, 2013  5:07 PM       Myovue abnormal needs cath see if you can set it up for me on one of my reader days ------

## 2013-11-30 NOTE — Telephone Encounter (Addendum)
The pt is advised and he verbalized understanding. The cath is scheduled with Dr Johnsie Cancel on 12/11/13 at 10:30. The pt states that he will come to this office on Wednesday 12/05/13 for lab work to be drawn. I went over cath instruction with the pt over the phone and will discuss again with him on 6/3 when he comes in for lab work. The pt is in agreement with plan.

## 2013-12-03 ENCOUNTER — Telehealth: Payer: Self-pay

## 2013-12-03 ENCOUNTER — Other Ambulatory Visit: Payer: Self-pay | Admitting: Cardiovascular Disease

## 2013-12-03 DIAGNOSIS — R079 Chest pain, unspecified: Secondary | ICD-10-CM

## 2013-12-03 NOTE — Telephone Encounter (Signed)
The pt was scheduled to have a Cath on 6/9 with Dr Johnsie Cancel but there is a conflict on Dr Mariana Arn schedule for that day. The pt is advised that we will reschedule his cath and pre procedure lab work when Dr Mariana Arn nurse Altha Harm returns to office on 6/3. He verbalized understanding. I called the cath lab and spoke with Crystal who states that she will cancel the cath for 6/9.

## 2013-12-07 ENCOUNTER — Encounter: Payer: Self-pay | Admitting: Cardiovascular Disease

## 2013-12-07 ENCOUNTER — Ambulatory Visit (INDEPENDENT_AMBULATORY_CARE_PROVIDER_SITE_OTHER): Payer: BC Managed Care – PPO | Admitting: Cardiovascular Disease

## 2013-12-07 VITALS — BP 124/68 | HR 84 | Ht 70.0 in | Wt 186.0 lb

## 2013-12-07 DIAGNOSIS — R079 Chest pain, unspecified: Secondary | ICD-10-CM

## 2013-12-07 DIAGNOSIS — Z01810 Encounter for preprocedural cardiovascular examination: Secondary | ICD-10-CM

## 2013-12-07 DIAGNOSIS — I10 Essential (primary) hypertension: Secondary | ICD-10-CM

## 2013-12-07 LAB — CBC WITH DIFFERENTIAL/PLATELET
Basophils Absolute: 0 K/uL (ref 0.0–0.1)
Basophils Relative: 0.5 % (ref 0.0–3.0)
Eosinophils Absolute: 0.2 K/uL (ref 0.0–0.7)
Eosinophils Relative: 2.8 % (ref 0.0–5.0)
HCT: 44.3 % (ref 39.0–52.0)
Hemoglobin: 15 g/dL (ref 13.0–17.0)
Lymphocytes Relative: 30.3 % (ref 12.0–46.0)
Lymphs Abs: 1.8 K/uL (ref 0.7–4.0)
MCHC: 33.9 g/dL (ref 30.0–36.0)
MCV: 86 fl (ref 78.0–100.0)
Monocytes Absolute: 0.5 K/uL (ref 0.1–1.0)
Monocytes Relative: 8.1 % (ref 3.0–12.0)
Neutro Abs: 3.5 K/uL (ref 1.4–7.7)
Neutrophils Relative %: 58.3 % (ref 43.0–77.0)
Platelets: 320 K/uL (ref 150.0–400.0)
RBC: 5.15 Mil/uL (ref 4.22–5.81)
RDW: 12.2 % (ref 11.5–15.5)
WBC: 6 K/uL (ref 4.0–10.5)

## 2013-12-07 LAB — PROTIME-INR
INR: 1 ratio (ref 0.8–1.0)
Prothrombin Time: 11.6 s (ref 9.6–13.1)

## 2013-12-07 LAB — BASIC METABOLIC PANEL
BUN: 9 mg/dL (ref 6–23)
CALCIUM: 9.8 mg/dL (ref 8.4–10.5)
CO2: 26 meq/L (ref 19–32)
CREATININE: 0.8 mg/dL (ref 0.4–1.5)
Chloride: 105 mEq/L (ref 96–112)
GFR: 138.55 mL/min (ref >60.00)
GLUCOSE: 127 mg/dL — AB (ref 70–99)
Potassium: 4 mEq/L (ref 3.5–5.1)
Sodium: 138 mEq/L (ref 135–145)

## 2013-12-07 NOTE — Assessment & Plan Note (Signed)
Well controlled.  Continue current medications and low sodium Dash type diet.    

## 2013-12-07 NOTE — Assessment & Plan Note (Signed)
Abnormal myovue suggesting inferior ischemia  Cath arranged for next Tuesday Lab work today Orders done

## 2013-12-07 NOTE — Progress Notes (Signed)
Patient ID: Paul Flowers, male   DOB: Aug 11, 1970, 43 y.o.   MRN: [id number] 43 yo seen in ER with atypical chest pain 5/5 Pain started after eating lunch. Has history of GERD Onset of symptoms 1 hour prior to arrival. Symptoms started shortly after eating lunch, but patient feels this is different from his usual GERD. Pt reports central "sharp and stabbing" chest pain that radiated to the left arm. He also felt "clammy" and mildly short of breath. Pt given ASA 324 mg and Nitro SL with EMS. No longer feels "clammy" or short of breath, but has had minimal improvement in chest pain. No recent travel, lower extremity edema or swelling, surgery, immobilization. Pain was not exertional In ER telemtry normal , R/O normal CXR and ECG with nonspecific ST/T wave changes  Thinks it was esophageal spasm Had not been taking his prevacid. No issues since d/c BP has been borderline but not on meds Had seen Dr Crissie Sickles for primary but needs new one   F/U myovue suggested inferior wall infarct/ishemia moderate area  Discussed options with patient and he prefers cath rather than cardiac CT for definitive diagnosis  No recurrent chest pain since first visit Risks discussed willing to proceed  Right radial approach.    ROS: Denies fever, malais, weight loss, blurry vision, decreased visual acuity, cough, sputum, SOB, hemoptysis, pleuritic pain, palpitaitons, heartburn, abdominal pain, melena, lower extremity edema, claudication, or rash.  All other systems reviewed and negative  General: Affect appropriate Healthy:  appears stated age 43: normal Neck supple with no adenopathy JVP normal no bruits no thyromegaly Lungs clear with no wheezing and good diaphragmatic motion Heart:  S1/S2 no murmur, no rub, gallop or click PMI normal Abdomen: benighn, BS positve, no tenderness, no AAA no bruit.  No HSM or HJR Distal pulses intact with no bruits No edema Neuro non-focal Skin warm and dry No muscular  weakness   Current Outpatient Prescriptions  Medication Sig Dispense Refill  . Aspirin-Acetaminophen-Caffeine (GOODY HEADACHE PO) Take 1 packet by mouth daily as needed (for pain).      . fluticasone (VERAMYST) 27.5 MCG/SPRAY nasal spray Place 1 spray into both nostrils daily as needed for allergies.      Marland Kitchen lansoprazole (PREVACID) 30 MG capsule Take 1 capsule (30 mg total) by mouth daily as needed (for indigestion).  30 capsule  11  . levocetirizine (XYZAL) 5 MG tablet Take 5 mg by mouth daily as needed for allergies.       No current facility-administered medications for this visit.    Allergies  Review of patient's allergies indicates no known allergies.  Electrocardiogram:  SR nonspecific ST changes   Assessment and Plan

## 2013-12-07 NOTE — Patient Instructions (Addendum)
Your physician recommends that you schedule a follow-up appointment in: NEXT  AVAILABLE  WITH  DR [facility]   Your physician has requested that you have a cardiac catheterization. Cardiac catheterization is used to diagnose and/or treat various heart conditions. Doctors may recommend this procedure for a number of different reasons. The most common reason is to evaluate chest pain. Chest pain can be a symptom of coronary artery disease (CAD), and cardiac catheterization can show whether plaque is narrowing or blocking your heart's arteries. This procedure is also used to evaluate the valves, as well as measure the blood flow and oxygen levels in different parts of your heart. For further information please visit [email or web address]. Please follow instruction sheet, as given.  SCHEDULED  FOR  12-11-13 WITH DR [facility] Your physician recommends that you continue on your current medications as directed. Please refer to the Current Medication list given to you today.  Your physician recommends that you return for lab work in:  TODAY    BMET  CBC INR

## 2013-12-10 ENCOUNTER — Encounter (HOSPITAL_COMMUNITY): Payer: Self-pay | Admitting: Pharmacy Technician

## 2013-12-11 ENCOUNTER — Ambulatory Visit (HOSPITAL_COMMUNITY)
Admission: RE | Admit: 2013-12-11 | Payer: BC Managed Care – PPO | Source: Ambulatory Visit | Admitting: Cardiovascular Disease

## 2013-12-11 ENCOUNTER — Observation Stay (HOSPITAL_COMMUNITY)
Admission: RE | Admit: 2013-12-11 | Discharge: 2013-12-11 | Disposition: A | Discharge status: Home / Self Care | Payer: Federal, State, Local not specified - PPO | Source: Ambulatory Visit | Attending: Cardiovascular Disease | Admitting: Cardiovascular Disease

## 2013-12-11 ENCOUNTER — Encounter (HOSPITAL_COMMUNITY)
Admission: RE | Disposition: A | Discharge status: Home / Self Care | Payer: Self-pay | Source: Ambulatory Visit | Attending: Cardiovascular Disease

## 2013-12-11 ENCOUNTER — Encounter (HOSPITAL_COMMUNITY): Admission: RE | Payer: Self-pay | Source: Ambulatory Visit

## 2013-12-11 DIAGNOSIS — Z9889 Other specified postprocedural states: Secondary | ICD-10-CM

## 2013-12-11 DIAGNOSIS — R0602 Shortness of breath: Secondary | ICD-10-CM | POA: Insufficient documentation

## 2013-12-11 DIAGNOSIS — I959 Hypotension, unspecified: Secondary | ICD-10-CM | POA: Diagnosis present

## 2013-12-11 DIAGNOSIS — R079 Chest pain, unspecified: Secondary | ICD-10-CM

## 2013-12-11 DIAGNOSIS — R9430 Abnormal result of cardiovascular function study, unspecified: Secondary | ICD-10-CM

## 2013-12-11 DIAGNOSIS — K219 Gastro-esophageal reflux disease without esophagitis: Secondary | ICD-10-CM | POA: Insufficient documentation

## 2013-12-11 DIAGNOSIS — I319 Disease of pericardium, unspecified: Secondary | ICD-10-CM

## 2013-12-11 DIAGNOSIS — R9439 Abnormal result of other cardiovascular function study: Secondary | ICD-10-CM | POA: Insufficient documentation

## 2013-12-11 HISTORY — PX: LEFT HEART CATHETERIZATION WITH CORONARY ANGIOGRAM: SHX5451

## 2013-12-11 HISTORY — DX: Other specified postprocedural states: Z98.890

## 2013-12-11 SURGERY — LEFT HEART CATHETERIZATION WITH CORONARY ANGIOGRAM
Anesthesia: LOCAL

## 2013-12-11 MED ORDER — LIDOCAINE HCL (PF) 1 % IJ SOLN
INTRAMUSCULAR | Status: AC
  Filled 2013-12-11: qty 30

## 2013-12-11 MED ORDER — FENTANYL CITRATE 0.05 MG/ML IJ SOLN
INTRAMUSCULAR | Status: AC
  Filled 2013-12-11: qty 2

## 2013-12-11 MED ORDER — VERAPAMIL HCL 2.5 MG/ML IV SOLN
INTRAVENOUS | Status: AC
  Filled 2013-12-11: qty 2

## 2013-12-11 MED ORDER — ASPIRIN 81 MG PO CHEW
81.0000 mg | CHEWABLE_TABLET | ORAL | Status: AC
  Administered 2013-12-11: 81 mg via ORAL
  Filled 2013-12-11: qty 1

## 2013-12-11 MED ORDER — OXYCODONE-ACETAMINOPHEN 5-325 MG PO TABS
1.0000 | ORAL_TABLET | ORAL | Status: DC | PRN
  Administered 2013-12-11: 2 via ORAL
  Filled 2013-12-11: qty 2

## 2013-12-11 MED ORDER — SODIUM CHLORIDE 0.9 % IV SOLN: INTRAVENOUS | Status: DC

## 2013-12-11 MED ORDER — MIDAZOLAM HCL 2 MG/2ML IJ SOLN
INTRAMUSCULAR | Status: AC
  Filled 2013-12-11: qty 2

## 2013-12-11 MED ORDER — SODIUM CHLORIDE 0.9 % IJ SOLN: 3.0000 mL | Freq: Two times a day (BID) | INTRAMUSCULAR | Status: DC

## 2013-12-11 MED ORDER — FLUMAZENIL 1 MG/10ML IV SOLN
INTRAVENOUS | Status: AC
  Filled 2013-12-11: qty 10

## 2013-12-11 MED ORDER — HEPARIN SODIUM (PORCINE) 1000 UNIT/ML IJ SOLN
INTRAMUSCULAR | Status: AC
  Filled 2013-12-11: qty 1

## 2013-12-11 MED ORDER — HEPARIN (PORCINE) IN NACL 2-0.9 UNIT/ML-% IJ SOLN
INTRAMUSCULAR | Status: AC
  Filled 2013-12-11: qty 1000

## 2013-12-11 MED ORDER — SODIUM CHLORIDE 0.9 % IJ SOLN: 3.0000 mL | INTRAMUSCULAR | Status: DC | PRN

## 2013-12-11 MED ORDER — SODIUM CHLORIDE 0.9 % IV SOLN: 250.0000 mL | INTRAVENOUS | Status: DC | PRN

## 2013-12-11 NOTE — Interval H&P Note (Signed)
History and Physical Interval Note:  12/11/2013 7:23 AM  Paul Flowers  has presented today for surgery, with the diagnosis of abnormal mioview  The various methods of treatment have been discussed with the patient and family. After consideration of risks, benefits and other options for treatment, the patient has consented to  Procedure(s): LEFT HEART CATHETERIZATION WITH CORONARY ANGIOGRAM (N/A) as a surgical intervention .  The patient's history has been reviewed, patient examined, no change in status, stable for surgery.  I have reviewed the patient's chart and labs.  Questions were answered to the patient's satisfaction.     Josue Hector

## 2013-12-11 NOTE — Discharge Instructions (Signed)

## 2013-12-11 NOTE — CV Procedure (Signed)
    Cardiac Catheterization Procedure Note  Name: Paul Flowers MRN: [id number] DOB: [birth year]  Procedure: Left Heart Cath, Selective Coronary Angiography, LV angiography  Indication:  Chest Pain abnormal myovue   Procedural Details: The right wrist was prepped, draped, and anesthetized with 1% lidocaine. Using the modified Seldinger technique, a 5 French sheath was introduced into the right radial artery. 3 mg of verapamil was administered through the sheath, weight-based unfractionated heparin was administered intravenously. Standard Judkins catheters were used for selective coronary angiography and left ventriculography. Catheter exchanges were performed over an exchange length guidewire. There were no immediate procedural complications. A TR band was used for radial hemostasis at the completion of the procedure.  The patient was transferred to the post catheterization recovery area for further monitoring.  Procedural Findings: Hemodynamics: AO 112 17 LV  114 17   Coronary angiography: Coronary dominance: right  Left mainstem:  Normal  Left anterior descending (LAD):  Normal   D1: normal  D2:  normal  Left circumflex (LCx):  Normal   OM1:  Normal  O  Right coronary artery (RCA):  Dominant and normal  Left ventriculography: Left ventricular systolic function is normal, LVEF is estimated at 55-65%, there is no significant mitral regurgitation   Final Conclusions:   No significant CAD  The patient had ST changes and hypotensive after RCA injections and while were trying to engage the LM  Aortic root injection showed normal aorta with no disection.  RAO ventriculogram normal with no perforation or RWMA.  Patient given fluid, romazicon, and dopamine 2.5-5ug/kg with stable BP over 023 systolic Patient did complain of some chest pain.  Review of angios showed no air embolus.  Limited echo in the cath lab showed no pericardial effusion.    Recommendations:  No CAD  May stay  overnight to ensure BP  Stable  Wean dopamine in TCU.  Radial band  Applied   Josue Hector 12/11/2013, 8:48 AM

## 2013-12-11 NOTE — Progress Notes (Signed)
  Echocardiogram 2D Echocardiogram has been performed.  Paul Flowers 12/11/2013, 9:43 AM

## 2013-12-11 NOTE — H&P (View-Only) (Signed)
Patient ID: Paul Flowers, male   DOB: 11/30/1970, 43 y.o.   MRN: [id number] 43 yo seen in ER with atypical chest pain 5/5 Pain started after eating lunch. Has history of GERD Onset of symptoms 1 hour prior to arrival. Symptoms started shortly after eating lunch, but patient feels this is different from his usual GERD. Pt reports central "sharp and stabbing" chest pain that radiated to the left arm. He also felt "clammy" and mildly short of breath. Pt given ASA 324 mg and Nitro SL with EMS. No longer feels "clammy" or short of breath, but has had minimal improvement in chest pain. No recent travel, lower extremity edema or swelling, surgery, immobilization. Pain was not exertional In ER telemtry normal , R/O normal CXR and ECG with nonspecific ST/T wave changes  Thinks it was esophageal spasm Had not been taking his prevacid. No issues since d/c BP has been borderline but not on meds Had seen Dr Crissie Sickles for primary but needs new one   F/U myovue suggested inferior wall infarct/ishemia moderate area  Discussed options with patient and he prefers cath rather than cardiac CT for definitive diagnosis  No recurrent chest pain since first visit Risks discussed willing to proceed  Right radial approach.    ROS: Denies fever, malais, weight loss, blurry vision, decreased visual acuity, cough, sputum, SOB, hemoptysis, pleuritic pain, palpitaitons, heartburn, abdominal pain, melena, lower extremity edema, claudication, or rash.  All other systems reviewed and negative  General: Affect appropriate Healthy:  appears stated age 67: normal Neck supple with no adenopathy JVP normal no bruits no thyromegaly Lungs clear with no wheezing and good diaphragmatic motion Heart:  S1/S2 no murmur, no rub, gallop or click PMI normal Abdomen: benighn, BS positve, no tenderness, no AAA no bruit.  No HSM or HJR Distal pulses intact with no bruits No edema Neuro non-focal Skin warm and dry No muscular  weakness   Current Outpatient Prescriptions  Medication Sig Dispense Refill  . Aspirin-Acetaminophen-Caffeine (GOODY HEADACHE PO) Take 1 packet by mouth daily as needed (for pain).      . fluticasone (VERAMYST) 27.5 MCG/SPRAY nasal spray Place 1 spray into both nostrils daily as needed for allergies.      Marland Kitchen lansoprazole (PREVACID) 30 MG capsule Take 1 capsule (30 mg total) by mouth daily as needed (for indigestion).  30 capsule  11  . levocetirizine (XYZAL) 5 MG tablet Take 5 mg by mouth daily as needed for allergies.       No current facility-administered medications for this visit.    Allergies  Review of patient's allergies indicates no known allergies.  Electrocardiogram:  SR nonspecific ST changes   Assessment and Plan

## 2014-02-14 ENCOUNTER — Ambulatory Visit: Payer: BC Managed Care – PPO | Admitting: Cardiovascular Disease

## 2014-03-18 ENCOUNTER — Ambulatory Visit: Payer: BC Managed Care – PPO | Admitting: Pulmonary Disease

## 2014-03-28 ENCOUNTER — Ambulatory Visit: Payer: BC Managed Care – PPO | Admitting: Cardiovascular Disease

## 2014-04-10 ENCOUNTER — Ambulatory Visit: Payer: BC Managed Care – PPO | Admitting: Pulmonary Disease

## 2014-04-24 ENCOUNTER — Ambulatory Visit (INDEPENDENT_AMBULATORY_CARE_PROVIDER_SITE_OTHER): Payer: BC Managed Care – PPO | Admitting: Pulmonary Disease

## 2014-04-24 ENCOUNTER — Encounter: Payer: Self-pay | Admitting: Pulmonary Disease

## 2014-04-24 VITALS — BP 116/78 | HR 89 | Temp 97.6°F | Ht 70.0 in | Wt 191.4 lb

## 2014-04-24 DIAGNOSIS — G4733 Obstructive sleep apnea (adult) (pediatric): Secondary | ICD-10-CM

## 2014-04-24 NOTE — Progress Notes (Signed)
   Subjective:    Patient ID: Paul Flowers, male    DOB: January 25, 1971, 43 y.o.   MRN: [id number]  HPI The patient comes in today for followup of his obstructive sleep apnea. He is not wearing CPAP compliantly, but clearly sees a difference in his sleep when he wears on a regular basis. A lot of his issues are related to inconvenience, but he sometimes thinks the pressure is too high. He is torn whether he wants to continue C. Pap or not.   Review of Systems  Constitutional: Negative for fever and unexpected weight change.  HENT: Negative for congestion, dental problem, ear pain, nosebleeds, postnasal drip, rhinorrhea, sinus pressure, sneezing, sore throat and trouble swallowing.   Eyes: Negative for redness and itching.  Respiratory: Negative for cough, chest tightness, shortness of breath and wheezing.   Cardiovascular: Negative for palpitations and leg swelling.  Gastrointestinal: Negative for nausea and vomiting.  Genitourinary: Negative for dysuria.  Musculoskeletal: Negative for joint swelling.  Skin: Negative for rash.  Neurological: Negative for headaches.  Hematological: Does not bruise/bleed easily.  Psychiatric/Behavioral: Negative for dysphoric mood. The patient is not nervous/anxious.        Objective:   Physical Exam Well-developed male in no acute distress Nose without purulence or discharge noted Neck without lymphadenopathy or thyromegaly No skin breakdown or pressure necrosis from the CPAP mask Lower extremities without edema, no cyanosis Alert and oriented, moves all 4 extremities.       Assessment & Plan:

## 2014-04-24 NOTE — Patient Instructions (Signed)
Will keep you on auto on your cpap machine, but will limit the upside pressure to 15cm. If you decide that cpap is not for you, would be happy to refer you to dental medicine for consideration of a dental appliance as we discussed. followup with me in one year if you decide to stay on cpap.

## 2014-04-24 NOTE — Assessment & Plan Note (Signed)
The patient has known moderate obstructive sleep apnea, and typically has used CPAP off and on for many years primarily because of inconvenience. It had a long discussion with him about his sleep apnea, and have offered him 3 different treatments. One would be to not treat his sleep apnea since he is not at significant risk for cardiovascular impact. However, the patient clearly sees a difference when he wears his CPAP on a consistent basis. The other option is to continue working on CPAP usage, and we can try and make some adjustments to his pressure. Finally, I have also discussed with him the possibility of a dental appliance, and he will let me know if he wishes referral for consultation.

## 2014-04-30 ENCOUNTER — Ambulatory Visit: Payer: BC Managed Care – PPO | Admitting: Cardiovascular Disease

## 2014-05-07 ENCOUNTER — Other Ambulatory Visit: Payer: Self-pay

## 2014-05-07 MED ORDER — LEVOCETIRIZINE DIHYDROCHLORIDE 5 MG PO TABS: 5.0000 mg | ORAL_TABLET | Freq: Every day | ORAL | Status: DC | PRN

## 2014-06-11 NOTE — Progress Notes (Signed)
Patient ID: Paul Flowers, male   DOB: 1970/08/14, 43 y.o.   MRN: [id number] 43 yo seen in ER with atypical chest pain 5/5 Pain started after eating lunch. Has history of GERD Onset of symptoms 1 hour prior to arrival. Symptoms started shortly after eating lunch, but patient feels this is different from his usual GERD. Pt reports central "sharp and stabbing" chest pain that radiated to the left arm. He also felt "clammy" and mildly short of breath. Pt given ASA 324 mg and Nitro SL with EMS. No longer feels "clammy" or short of breath, but has had minimal improvement in chest pain. No recent travel, lower extremity edema or swelling, surgery, immobilization. Pain was not exertional In ER telemtry normal , R/O normal CXR and ECG with nonspecific ST/T wave changes  Thinks it was esophageal spasm Had not been taking his prevacid. 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  All other systems reviewed and negative  General: Affect appropriate Healthy:  appears stated age 16: normal Neck supple with no adenopathy JVP normal no bruits no thyromegaly Lungs clear with no wheezing and good diaphragmatic motion Heart:  S1/S2 no murmur, no rub, gallop or click PMI normal Abdomen: benighn, BS positve, no tenderness, no AAA no bruit.  No HSM or HJR Distal pulses intact with no bruits No edema Neuro non-focal Skin warm and dry No muscular weakness   Current Outpatient Prescriptions  Medication Sig Dispense Refill  . Aspirin-Acetaminophen-Caffeine (GOODY HEADACHE PO) Take  1 packet by mouth daily as needed (for pain).    . fluticasone (VERAMYST) 27.5 MCG/SPRAY nasal spray Place 1 spray into both nostrils daily as needed for allergies.    Marland Kitchen lansoprazole (PREVACID) 30 MG capsule Take 1 capsule (30 mg total) by mouth daily as needed (for indigestion). 30 capsule 11  . levocetirizine (XYZAL) 5 MG tablet Take 1 tablet (5 mg total) by mouth daily as needed for allergies. 30 tablet 0   No current facility-administered medications for this visit.    Allergies  Review of patient's allergies indicates no known allergies.  Electrocardiogram:  SR rate 83  Normal   Assessment and Plan

## 2014-06-12 ENCOUNTER — Encounter: Payer: BC Managed Care – PPO | Admitting: Cardiovascular Disease

## 2014-06-13 ENCOUNTER — Encounter (HOSPITAL_COMMUNITY): Payer: Self-pay | Admitting: Cardiovascular Disease

## 2014-06-30 ENCOUNTER — Other Ambulatory Visit: Payer: Self-pay | Admitting: Internal Medicine

## 2014-07-03 ENCOUNTER — Ambulatory Visit (INDEPENDENT_AMBULATORY_CARE_PROVIDER_SITE_OTHER): Payer: BC Managed Care – PPO | Admitting: Cardiovascular Disease

## 2014-07-03 ENCOUNTER — Encounter: Payer: Self-pay | Admitting: Cardiovascular Disease

## 2014-07-03 VITALS — BP 138/84 | HR 100 | Ht 70.0 in | Wt 189.1 lb

## 2014-07-03 DIAGNOSIS — R072 Precordial pain: Secondary | ICD-10-CM

## 2014-07-03 DIAGNOSIS — I10 Essential (primary) hypertension: Secondary | ICD-10-CM

## 2014-07-03 NOTE — Assessment & Plan Note (Signed)
Resolved cath with no significant epicardial CAD

## 2014-07-03 NOTE — Progress Notes (Signed)
Patient ID: Paul Flowers, male   DOB: 1971-04-19, 43 y.o.   MRN: [id number] 43 yo seen in ER with atypical chest pain 5/5 Pain started after eating lunch. Has history of GERD Onset of symptoms 1 hour prior to arrival. Symptoms started shortly after eating lunch, but patient feels this is different from his usual GERD. Pt reports central "sharp and stabbing" chest pain that radiated to the left arm. He also felt "clammy" and mildly short of breath. Pt given ASA 324 mg and Nitro SL with EMS. No longer feels "clammy" or short of breath, but has had minimal improvement in chest pain. No recent travel, lower extremity edema or swelling, surgery, immobilization. Pain was not exertional In ER telemtry normal , R/O normal CXR and ECG with nonspecific ST/T wave changes  Thinks it was esophageal spasm Had not been taking his prevacid. 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   Cath 6/9 no CAD normal EF    ROS: Denies fever, malais, weight loss, blurry vision, decreased visual acuity, cough, sputum, SOB, hemoptysis, pleuritic pain, palpitaitons, heartburn, abdominal pain, melena, lower extremity edema, claudication, or rash.  All other systems reviewed and negative  General: Affect appropriate Healthy:  appears stated age 89: normal Neck supple with no adenopathy JVP normal no bruits no thyromegaly Lungs clear with no wheezing and good diaphragmatic motion Heart:  S1/S2 no murmur, no rub, gallop or click PMI normal Abdomen: benighn, BS positve, no tenderness, no AAA no bruit.  No HSM or HJR Distal pulses intact with no bruits No edema Neuro non-focal Skin warm and  dry No muscular weakness   Current Outpatient Prescriptions  Medication Sig Dispense Refill  . Aspirin-Acetaminophen-Caffeine (GOODY HEADACHE PO) Take 1 packet by mouth daily as needed (for pain).    . fluticasone (VERAMYST) 27.5 MCG/SPRAY nasal spray Place 1 spray into both nostrils daily as needed for allergies.    Marland Kitchen lansoprazole (PREVACID) 30 MG capsule Take 1 capsule (30 mg total) by mouth daily as needed (for indigestion). 30 capsule 11  . levocetirizine (XYZAL) 5 MG tablet Take 1 tablet (5 mg total) by mouth daily as needed for allergies. 30 tablet 0   No current facility-administered medications for this visit.    Allergies  Review of patient's allergies indicates no known allergies.  Electrocardiogram:  6/15  SR nonspecific inferior ST changes   Assessment and Plan

## 2014-07-03 NOTE — Assessment & Plan Note (Signed)
Currently not on meds will check HR/BP at home f./u next available His primary Norrins retired I will start BP meds next visit if high at home

## 2014-07-03 NOTE — Patient Instructions (Signed)
Your physician recommends that you schedule a follow-up appointment in: NEXT  AVAILABLE WITH  DR NISHAN  Your physician recommends that you continue on your current medications as directed. Please refer to the Current Medication list given to you today. 

## 2014-07-31 ENCOUNTER — Encounter: Payer: Self-pay | Admitting: Cardiovascular Disease

## 2014-07-31 ENCOUNTER — Ambulatory Visit (INDEPENDENT_AMBULATORY_CARE_PROVIDER_SITE_OTHER): Payer: Federal, State, Local not specified - PPO | Admitting: Cardiovascular Disease

## 2014-07-31 VITALS — BP 148/80 | Ht 69.0 in | Wt 193.8 lb

## 2014-07-31 DIAGNOSIS — R072 Precordial pain: Secondary | ICD-10-CM

## 2014-07-31 DIAGNOSIS — Z79899 Other long term (current) drug therapy: Secondary | ICD-10-CM

## 2014-07-31 DIAGNOSIS — I10 Essential (primary) hypertension: Secondary | ICD-10-CM

## 2014-07-31 MED ORDER — LOSARTAN POTASSIUM 25 MG PO TABS: 25.0000 mg | ORAL_TABLET | Freq: Every day | ORAL | Status: DC

## 2014-07-31 NOTE — Assessment & Plan Note (Signed)
Start cozaar 25 mg  F/U BMET in 3 weeks f/u me next available

## 2014-07-31 NOTE — Assessment & Plan Note (Signed)
Resolved non cardiac  Cath 6/15  No CAD

## 2014-07-31 NOTE — Progress Notes (Signed)
Patient ID: Paul Flowers, male   DOB: [birth year], 44 y.o.   MRN: [id number] 43 y.o. seen in ER with atypical chest pain 5/5 Pain started after eating lunch. Has history of GERD Onset of symptoms 1 hour prior to arrival. Symptoms started shortly after eating lunch, but patient feels this is different from his usual GERD. Pt reports central "sharp and stabbing" chest pain that radiated to the left arm. He also felt "clammy" and mildly short of breath. Pt given ASA 324 mg and Nitro SL with EMS. No longer feels "clammy" or short of breath, but has had minimal improvement in chest pain. No recent travel, lower extremity edema or swelling, surgery, immobilization. Pain was not exertional In ER telemtry normal , R/O normal CXR and ECG with nonspecific ST/T wave changes  Thinks it was esophageal spasm Had not been taking his prevacid. 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   Cath 12/11/13  no CAD normal EF   BP high at home discussed starting BP meds    ROS: Denies fever, malais, weight loss, blurry vision, decreased visual acuity, cough, sputum, SOB, hemoptysis, pleuritic pain, palpitaitons, heartburn, abdominal pain, melena, lower extremity edema, claudication, or rash.  All other systems reviewed and negative  General: Affect appropriate Healthy:  appears stated age 70: normal Neck supple with no adenopathy JVP normal no bruits no thyromegaly Lungs clear with no wheezing and good diaphragmatic motion Heart:  S1/S2 no murmur, no rub, gallop or click PMI normal Abdomen: benighn, BS positve, no tenderness, no AAA no bruit.  No HSM or HJR Distal pulses intact  with no bruits No edema Neuro non-focal Skin warm and dry No muscular weakness   Current Outpatient Prescriptions  Medication Sig Dispense Refill  . Aspirin-Acetaminophen-Caffeine (GOODY HEADACHE PO) Take 1 packet by mouth daily as needed (for pain).    . fluticasone (VERAMYST) 27.5 MCG/SPRAY nasal spray Place 1 spray into both nostrils daily as needed for allergies.    Marland Kitchen lansoprazole (PREVACID) 30 MG capsule Take 1 capsule (30 mg total) by mouth daily as needed (for indigestion). 30 capsule 11  . levocetirizine (XYZAL) 5 MG tablet Take 1 tablet (5 mg total) by mouth daily as needed for allergies. 30 tablet 0   No current facility-administered medications for this visit.    Allergies  Review of patient's allergies indicates no known allergies.  Electrocardiogram:  6/15  SR nonspecific inferior ST changes   Assessment and Plan

## 2014-07-31 NOTE — Patient Instructions (Signed)
Your physician recommends that you schedule a follow-up appointment in: NEXT  AVAILABLE WITH DR [facility]  Your physician has recommended you make the following change in your medication:  START  LOSARTAN  [address] [place] recommends that you return for lab work in:  [place]

## 2014-08-21 ENCOUNTER — Other Ambulatory Visit (INDEPENDENT_AMBULATORY_CARE_PROVIDER_SITE_OTHER): Payer: Federal, State, Local not specified - PPO | Admitting: *Deleted

## 2014-08-21 DIAGNOSIS — Z79899 Other long term (current) drug therapy: Secondary | ICD-10-CM

## 2014-08-21 LAB — BASIC METABOLIC PANEL
BUN: 10 mg/dL (ref 6–23)
CO2: 25 mEq/L (ref 19–32)
Calcium: 9.4 mg/dL (ref 8.4–10.5)
Chloride: 102 mEq/L (ref 96–112)
Creatinine, Ser: 0.76 mg/dL (ref 0.40–1.50)
GFR: 142.31 mL/min (ref >60.00)
Glucose, Bld: 186 mg/dL — ABNORMAL HIGH (ref 70–99)
Potassium: 3.9 mEq/L (ref 3.5–5.1)
Sodium: 137 mEq/L (ref 135–145)

## 2014-08-28 ENCOUNTER — Telehealth: Payer: Self-pay | Admitting: Cardiovascular Disease

## 2014-08-28 NOTE — Telephone Encounter (Signed)
PT AWARE OF LAB RESULTS./CY 

## 2014-08-28 NOTE — Telephone Encounter (Signed)
Follow Up ° °Pt returned call/sr  °

## 2014-09-04 ENCOUNTER — Ambulatory Visit: Payer: Federal, State, Local not specified - PPO | Admitting: Cardiovascular Disease

## 2014-09-24 ENCOUNTER — Ambulatory Visit (INDEPENDENT_AMBULATORY_CARE_PROVIDER_SITE_OTHER): Payer: Federal, State, Local not specified - PPO | Admitting: Family Medicine

## 2014-09-24 ENCOUNTER — Encounter: Payer: Self-pay | Admitting: Family Medicine

## 2014-09-24 VITALS — BP 120/82 | HR 83 | Temp 98.6°F | Ht 70.0 in | Wt 193.2 lb

## 2014-09-24 DIAGNOSIS — G4733 Obstructive sleep apnea (adult) (pediatric): Secondary | ICD-10-CM

## 2014-09-24 DIAGNOSIS — R739 Hyperglycemia, unspecified: Secondary | ICD-10-CM | POA: Diagnosis not present

## 2014-09-24 DIAGNOSIS — J302 Other seasonal allergic rhinitis: Secondary | ICD-10-CM

## 2014-09-24 DIAGNOSIS — K219 Gastro-esophageal reflux disease without esophagitis: Secondary | ICD-10-CM | POA: Diagnosis not present

## 2014-09-24 DIAGNOSIS — E119 Type 2 diabetes mellitus without complications: Secondary | ICD-10-CM | POA: Insufficient documentation

## 2014-09-24 DIAGNOSIS — I10 Essential (primary) hypertension: Secondary | ICD-10-CM

## 2014-09-24 LAB — LIPID PANEL
CHOLESTEROL: 211 mg/dL — AB (ref 0–200)
HDL: 47.6 mg/dL (ref >39.00)
LDL Cholesterol: 138 mg/dL — ABNORMAL HIGH (ref 0–99)
NonHDL: 163.4
TRIGLYCERIDES: 127 mg/dL (ref 0.0–149.0)
Total CHOL/HDL Ratio: 4
VLDL: 25.4 mg/dL (ref 0.0–40.0)

## 2014-09-24 LAB — COMPREHENSIVE METABOLIC PANEL
ALBUMIN: 4.1 g/dL (ref 3.5–5.2)
ALK PHOS: 59 U/L (ref 39–117)
ALT: 39 U/L (ref 0–53)
AST: 29 U/L (ref 0–37)
BUN: 9 mg/dL (ref 6–23)
CALCIUM: 9.3 mg/dL (ref 8.4–10.5)
CO2: 29 meq/L (ref 19–32)
Chloride: 103 mEq/L (ref 96–112)
Creatinine, Ser: 0.76 mg/dL (ref 0.40–1.50)
GFR: 142.25 mL/min (ref >60.00)
Glucose, Bld: 110 mg/dL — ABNORMAL HIGH (ref 70–99)
POTASSIUM: 4.2 meq/L (ref 3.5–5.1)
SODIUM: 137 meq/L (ref 135–145)
TOTAL PROTEIN: 7.5 g/dL (ref 6.0–8.3)
Total Bilirubin: 0.8 mg/dL (ref 0.2–1.2)

## 2014-09-24 LAB — HEMOGLOBIN A1C: Hgb A1c MFr Bld: 6.5 % (ref 4.6–6.5)

## 2014-09-24 NOTE — Progress Notes (Signed)
   Subjective:    Patient ID: Paul Flowers, male    DOB: 15-Dec-1970, 44 y.o.   MRN: [id number]  HPI   44 year old male pt presents to transfer care from Dr. Linda Hedges.  Went to ER 11/06/13  for atypical chest pain. R Dr. Johnsie Cancel referred to F/U [facility] suggested inferior wall infarct/ishemia moderate area  Discussed options with patient and he prefers cath rather than cardiac CT for definitive diagnosis  2015 Cath 12/11/13 no CAD normal EF   Followed for HTN. Started on losartan in 07/2014 BP Readings from Last 3 Encounters:  09/24/14 120/82  07/31/14 148/80  07/03/14 138/84   He has been checking BP at home 138/84. He has been working on [employer], Gin, vodka, juice) 2 a day until last 2 weeks. He has changed to salad, grilled chicken, avoiding fried foods, and chol.  Increasing exercise. 2 times a week on treadmill  Labs done at Dr. Johnsie Cancel: 08/21/2014 FBS 186, ? Fasting may have had cream and sugar   Allergies: in spring poorly controlled on flonase and xyzal.  Veramyst was helping more but to costly. Has not been on other medications in past pother than benadryl.  GERD, diet has improved control in last 2 weeks. On prevacid.  Review of Systems  Constitutional: Negative for fever, fatigue and unexpected weight change.  HENT: Negative for congestion, ear pain, postnasal drip, rhinorrhea, sore throat and trouble swallowing.   Eyes: Negative for pain.  Respiratory: Negative for cough, shortness of breath and wheezing.   Cardiovascular: Negative for chest pain, palpitations and leg swelling.  Gastrointestinal: Negative for nausea, abdominal pain, diarrhea, constipation and blood in stool.  Genitourinary: Negative for dysuria, urgency, hematuria, discharge, penile swelling, scrotal swelling, difficulty urinating, penile pain and testicular pain.  Skin: Negative for rash.  Neurological: Negative for syncope, weakness, light-headedness, numbness and headaches.    Psychiatric/Behavioral: Negative for behavioral problems and dysphoric mood. The patient is not nervous/anxious.        Objective:   Physical Exam  Constitutional: Vital signs are normal. He appears well-developed and well-nourished.  HENT:  Head: Normocephalic.  Right Ear: Hearing normal.  Left Ear: Hearing normal.  Nose: Nose normal.  Mouth/Throat: Oropharynx is clear and moist and mucous membranes are normal.  Neck: Trachea normal. Carotid bruit is not present. No thyroid mass and no thyromegaly present.  Cardiovascular: Normal rate, regular rhythm and normal pulses.  Exam reveals no gallop, no distant heart sounds and no friction rub.   No murmur heard. No peripheral edema  Pulmonary/Chest: Effort normal and breath sounds normal. No respiratory distress.  Skin: Skin is warm, dry and intact. No rash noted.  Psychiatric: He has a normal mood and affect. His speech is normal and behavior is normal. Thought content normal.          Assessment & Plan:  Med student note closed prematurely before signed by MD. Patient seen and examined along with med student. Reviewed med student note in detail. Agree with HPI/ROS/PE and assessment/plan included.  Eliezer Lofts MD

## 2014-09-24 NOTE — Assessment & Plan Note (Signed)
Followed by Dr. Gwenette Greet, wears mask off and on.

## 2014-09-24 NOTE — Assessment & Plan Note (Signed)
Currently well controlled on prevacid with diet

## 2014-09-24 NOTE — Progress Notes (Signed)
Subjective:     Patient ID: Paul Flowers, male   DOB: Jul 02, 1971, 44 y.o.   MRN: [id number]  HPI Paul Flowers is a 44 y/o M presenting for transfer of care.   PMH: Hypertension- managed with Losartan, dietary changes (decreased alcohol), and exercise (twice a week- walking on treadmill). BP Readings from Last 3 Encounters:  09/24/14 120/82  07/31/14 148/80  07/03/14 138/84   Hyperglycemia- elevated blood glucose of 186 on 08/21/14, pt unsure if measurement was fasting Seasonal allergies- treated with Flonase and Xyzal GERD- diet seems to be helping, less issues in last 2 weeks since dietary change Chest pain- 11/2013 worked up with cardiac CT and cardiac cath, results ultimately within normal limits OSA- has CPAP, does not use consistently    FH:  Mother- HTN Father- hyperlipidemia, CAD (double bypass at 24) Sister- healthy Brother- healthy Son- healthy Daughter- healthy Maternal grandmother- DM, heart disease Maternal grandfather- CVA   SH: Pt reports alcohol consumption of 2 drinks per day, until the last 2 weeks when the patient decreased his alcohol intake.  Is working on [employer], increasing intake of chicken and salads. Has been avoiding fast food, fried foods.  Nonsmoker. Works as a Librarian, academic    Review of Systems     Objective:   Physical Exam  Constitutional: He appears well-developed and well-nourished.  HENT:  Head: Normocephalic and atraumatic.  Right Ear: Tympanic membrane and external ear normal. No drainage.  Left Ear: Tympanic membrane and external ear normal. No drainage.  Mouth/Throat: Oropharynx is clear and moist and mucous membranes are normal. No oropharyngeal exudate.  Neck: Carotid bruit is not present. No thyroid mass and no thyromegaly present.  Cardiovascular: Normal rate, regular rhythm, S1 normal, S2 normal and normal pulses.  Exam reveals no gallop and no friction rub.   No murmur heard. Pulmonary/Chest: Effort normal and  breath sounds normal.  Skin: No rash noted.  Psychiatric: He has a normal mood and affect.    Assessment:      Plan:   Recheck fasting glucose and A1C today Stop Xyzal and switch to OTC Zyrtec or Allegra Advised patient about importance of adequate control of OSA

## 2014-09-24 NOTE — Assessment & Plan Note (Signed)
Improved control on losartan. Encouraged exercise, weight loss, healthy eating habits.

## 2014-09-24 NOTE — Assessment & Plan Note (Signed)
Counseling provided for low carb diet.  Will eval with A1C and fasting CBG to determine if prediabetes or diabetes diagnosis.  Offered referral to nutritionist.

## 2014-09-24 NOTE — Assessment & Plan Note (Addendum)
Xyzal and flonase not helping. Consider zyrtec or allegra to treat.

## 2014-09-24 NOTE — Patient Instructions (Addendum)
Continue working on healthy eating and exercise, weight loss. Continue prevacid for now unless heartburn returns. Stop at lab on way out.

## 2014-09-24 NOTE — Progress Notes (Signed)
Pre visit review using our clinic review tool, if applicable. No additional management support is needed unless otherwise documented below in the visit note. 

## 2014-09-25 ENCOUNTER — Encounter: Payer: Self-pay | Admitting: *Deleted

## 2014-09-25 ENCOUNTER — Telehealth: Payer: Self-pay | Admitting: Family Medicine

## 2014-09-25 NOTE — Telephone Encounter (Signed)
emmi emailed °

## 2014-10-01 ENCOUNTER — Other Ambulatory Visit: Payer: Self-pay | Admitting: Internal Medicine

## 2014-10-08 ENCOUNTER — Other Ambulatory Visit: Payer: Self-pay | Admitting: Family Medicine

## 2014-10-09 ENCOUNTER — Telehealth: Payer: Self-pay | Admitting: Family Medicine

## 2014-10-09 NOTE — Telephone Encounter (Signed)
Pt dropped off letter from [employer] that some of his medications are not covered.  Pt is on last dose of Singulair and it is not covered anymore.  Please call pt to let him know if substitute medications are called in. His number is [phone]. Placing letter on [employer]. Thanks.

## 2014-10-10 MED ORDER — MONTELUKAST SODIUM 10 MG PO TABS: 10.0000 mg | ORAL_TABLET | Freq: Every day | ORAL | Status: DC

## 2014-10-10 MED ORDER — MOMETASONE FUROATE 50 MCG/ACT NA SUSP: 2.0000 | Freq: Every day | NASAL | Status: DC

## 2014-10-10 NOTE — Telephone Encounter (Signed)
[name] notified as instructed by telephone.  He would prefer to try one of the covered medications because he would cost him more to try an OTC medication like allgegra or zyrtec.  Spoke with Dr. Diona Browner.  Ok to send in Rx for Singulair and Nasonex.  [name] notified prescriptions have been sent to his pharmacy.  I advised him to try these new medications for at least one month and if they do not control his symptoms to let us know and we can move forward with a prior authorization for levocertirizine.  Medication list updated.

## 2014-10-10 NOTE — Telephone Encounter (Signed)
Looked to me like levoceterizine is not covered but singulair/motelukast is.  Instead of levoceterizine he can use OTC allegra or zyrtec ( fexofenadine or ceterizine).. If not controlling issue in 1 mo we can send in an appeal to try to get them to approve levoceterizine.  Paper in outbox.

## 2014-10-10 NOTE — Telephone Encounter (Signed)
Letter placed in Dr. Rometta Emery in box for review.

## 2014-10-11 ENCOUNTER — Encounter: Payer: Self-pay | Admitting: Cardiovascular Disease

## 2014-10-11 ENCOUNTER — Ambulatory Visit (INDEPENDENT_AMBULATORY_CARE_PROVIDER_SITE_OTHER): Payer: Federal, State, Local not specified - PPO | Admitting: Cardiovascular Disease

## 2014-10-11 VITALS — BP 110/60 | HR 90 | Ht 70.0 in | Wt 193.6 lb

## 2014-10-11 DIAGNOSIS — I10 Essential (primary) hypertension: Secondary | ICD-10-CM

## 2014-10-11 NOTE — Assessment & Plan Note (Signed)
Much better with lifestyle changes and cozaar  Continue  F/u with [facility] as primary and [place] PRN

## 2014-10-11 NOTE — Progress Notes (Signed)
Patient ID: Paul Flowers, male   DOB: 1970-10-15, 44 y.o.   MRN: [id number] 43 y.o. seen in ER with atypical chest pain 5/5 Pain started after eating lunch. Has history of GERD Onset of symptoms 1 hour prior to arrival. Symptoms started shortly after eating lunch, but patient feels this is different from his usual GERD. Pt reports central "sharp and stabbing" chest pain that radiated to the left arm. He also felt "clammy" and mildly short of breath. Pt given ASA 324 mg and Nitro SL with EMS. No longer feels "clammy" or short of breath, but has had minimal improvement in chest pain. No recent travel, lower extremity edema or swelling, surgery, immobilization. Pain was not exertional In ER telemtry normal , R/O normal CXR and ECG with nonspecific ST/T wave changes  Thinks it was esophageal spasm Had not been taking his prevacid. 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   Cath 12/11/13  no CAD normal EF   BP high at home discussed starting BP meds Cozaar 25 mg started 07/31/14   Compliant with med Better diet  Exercising only 2x/week   ROS: Denies fever, malais, weight loss, blurry vision, decreased visual acuity, cough, sputum, SOB, hemoptysis, pleuritic pain, palpitaitons, heartburn, abdominal pain, melena, lower extremity edema, claudication, or rash.  All other systems reviewed and negative  General: Affect appropriate Healthy:  appears stated age 21: normal Neck supple with no adenopathy JVP normal no bruits no thyromegaly Lungs clear with no wheezing and good diaphragmatic motion Heart:  S1/S2 no murmur, no rub, gallop or click PMI normal Abdomen: benighn,  BS positve, no tenderness, no AAA no bruit.  No HSM or HJR Distal pulses intact with no bruits No edema Neuro non-focal Skin warm and dry No muscular weakness   Current Outpatient Prescriptions  Medication Sig Dispense Refill  . Aspirin-Acetaminophen-Caffeine (GOODY HEADACHE PO) Take 1 packet by mouth daily as needed (for pain).    Marland Kitchen lansoprazole (PREVACID) 30 MG capsule Take 1 capsule (30 mg total) by mouth daily as needed (for indigestion). 30 capsule 11  . losartan (COZAAR) 25 MG tablet Take 1 tablet (25 mg total) by mouth daily. 90 tablet 3  . mometasone (NASONEX) 50 MCG/ACT nasal spray Place 2 sprays into the nose daily. 17 g 11  . montelukast (SINGULAIR) 10 MG tablet Take 1 tablet (10 mg total) by mouth at bedtime. 30 tablet 11   No current facility-administered medications for this visit.    Allergies  Review of patient's allergies indicates no known allergies.  Electrocardiogram:  6/15  SR nonspecific inferior ST changes   Assessment and Plan

## 2014-10-11 NOTE — Patient Instructions (Addendum)
Your physician recommends that you schedule a follow-up appointment in: AS NEEDED  Your physician recommends that you continue on your current medications as directed. Please refer to the Current Medication list given to you today.  

## 2014-10-29 ENCOUNTER — Emergency Department (HOSPITAL_COMMUNITY): Payer: Federal, State, Local not specified - PPO

## 2014-10-29 ENCOUNTER — Encounter (HOSPITAL_COMMUNITY): Payer: Self-pay | Admitting: Emergency Medicine

## 2014-10-29 ENCOUNTER — Emergency Department (HOSPITAL_COMMUNITY)
Admission: EM | Admit: 2014-10-29 | Discharge: 2014-10-29 | Disposition: A | Discharge status: Home / Self Care | Payer: Federal, State, Local not specified - PPO | Attending: Emergency Medicine | Admitting: Emergency Medicine

## 2014-10-29 DIAGNOSIS — R630 Anorexia: Secondary | ICD-10-CM | POA: Diagnosis not present

## 2014-10-29 DIAGNOSIS — R197 Diarrhea, unspecified: Secondary | ICD-10-CM | POA: Insufficient documentation

## 2014-10-29 DIAGNOSIS — Z8669 Personal history of other diseases of the nervous system and sense organs: Secondary | ICD-10-CM | POA: Insufficient documentation

## 2014-10-29 DIAGNOSIS — R1011 Right upper quadrant pain: Secondary | ICD-10-CM | POA: Insufficient documentation

## 2014-10-29 DIAGNOSIS — Z9889 Other specified postprocedural states: Secondary | ICD-10-CM | POA: Insufficient documentation

## 2014-10-29 DIAGNOSIS — Z7951 Long term (current) use of inhaled steroids: Secondary | ICD-10-CM | POA: Diagnosis not present

## 2014-10-29 DIAGNOSIS — Z8709 Personal history of other diseases of the respiratory system: Secondary | ICD-10-CM | POA: Diagnosis not present

## 2014-10-29 DIAGNOSIS — Z79899 Other long term (current) drug therapy: Secondary | ICD-10-CM | POA: Diagnosis not present

## 2014-10-29 DIAGNOSIS — K219 Gastro-esophageal reflux disease without esophagitis: Secondary | ICD-10-CM | POA: Insufficient documentation

## 2014-10-29 DIAGNOSIS — R1013 Epigastric pain: Secondary | ICD-10-CM | POA: Insufficient documentation

## 2014-10-29 DIAGNOSIS — I10 Essential (primary) hypertension: Secondary | ICD-10-CM | POA: Diagnosis not present

## 2014-10-29 DIAGNOSIS — R109 Unspecified abdominal pain: Secondary | ICD-10-CM

## 2014-10-29 LAB — URINALYSIS, ROUTINE W REFLEX MICROSCOPIC
BILIRUBIN URINE: NEGATIVE
Glucose, UA: NEGATIVE mg/dL
HGB URINE DIPSTICK: NEGATIVE
Ketones, ur: NEGATIVE mg/dL
Leukocytes, UA: NEGATIVE
Nitrite: NEGATIVE
PH: 6 (ref 5.0–8.0)
PROTEIN: NEGATIVE mg/dL
Specific Gravity, Urine: 1.028 (ref 1.005–1.030)
Urobilinogen, UA: 1 mg/dL (ref 0.0–1.0)

## 2014-10-29 LAB — CBC WITH DIFFERENTIAL/PLATELET
BASOS PCT: 0 % (ref 0–1)
Basophils Absolute: 0 10*3/uL (ref 0.0–0.1)
Eosinophils Absolute: 0.1 10*3/uL (ref 0.0–0.7)
Eosinophils Relative: 1 % (ref 0–5)
HEMATOCRIT: 42.2 % (ref 39.0–52.0)
Hemoglobin: 14.7 g/dL (ref 13.0–17.0)
LYMPHS ABS: 0.7 10*3/uL (ref 0.7–4.0)
Lymphocytes Relative: 6 % — ABNORMAL LOW (ref 12–46)
MCH: 29.6 pg (ref 26.0–34.0)
MCHC: 34.8 g/dL (ref 30.0–36.0)
MCV: 84.9 fL (ref 78.0–100.0)
MONOS PCT: 12 % (ref 3–12)
Monocytes Absolute: 1.4 10*3/uL — ABNORMAL HIGH (ref 0.1–1.0)
Neutro Abs: 9.7 10*3/uL — ABNORMAL HIGH (ref 1.7–7.7)
Neutrophils Relative %: 81 % — ABNORMAL HIGH (ref 43–77)
Platelets: 254 10*3/uL (ref 150–400)
RBC: 4.97 MIL/uL (ref 4.22–5.81)
RDW: 12.4 % (ref 11.5–15.5)
WBC: 12 10*3/uL — ABNORMAL HIGH (ref 4.0–10.5)

## 2014-10-29 LAB — COMPREHENSIVE METABOLIC PANEL
ALBUMIN: 4.4 g/dL (ref 3.5–5.2)
ALT: 108 U/L — AB (ref 0–53)
AST: 240 U/L — ABNORMAL HIGH (ref 0–37)
Alkaline Phosphatase: 83 U/L (ref 39–117)
Anion gap: 9 (ref 5–15)
BILIRUBIN TOTAL: 2 mg/dL — AB (ref 0.3–1.2)
BUN: 12 mg/dL (ref 6–23)
CALCIUM: 9.3 mg/dL (ref 8.4–10.5)
CHLORIDE: 103 mmol/L (ref 96–112)
CO2: 25 mmol/L (ref 19–32)
Creatinine, Ser: 0.84 mg/dL (ref 0.50–1.35)
GFR calc Af Amer: >90 mL/min (ref >90)
GFR calc non Af Amer: >90 mL/min (ref >90)
GLUCOSE: 159 mg/dL — AB (ref 70–99)
POTASSIUM: 3.4 mmol/L — AB (ref 3.5–5.1)
Sodium: 137 mmol/L (ref 135–145)
Total Protein: 7.8 g/dL (ref 6.0–8.3)

## 2014-10-29 LAB — LIPASE, BLOOD: Lipase: 18 U/L (ref 11–59)

## 2014-10-29 MED ORDER — OXYCODONE-ACETAMINOPHEN 5-325 MG PO TABS: 1.0000 | ORAL_TABLET | Freq: Four times a day (QID) | ORAL | Status: DC | PRN

## 2014-10-29 MED ORDER — SODIUM CHLORIDE 0.9 % IV BOLUS (SEPSIS)
1000.0000 mL | Freq: Once | INTRAVENOUS | Status: AC
  Administered 2014-10-29: 1000 mL via INTRAVENOUS

## 2014-10-29 MED ORDER — IOHEXOL 300 MG/ML  SOLN
50.0000 mL | Freq: Once | INTRAMUSCULAR | Status: AC | PRN
  Administered 2014-10-29: 50 mL via ORAL

## 2014-10-29 MED ORDER — ONDANSETRON HCL 4 MG/2ML IJ SOLN
4.0000 mg | Freq: Once | INTRAMUSCULAR | Status: AC
  Administered 2014-10-29: 4 mg via INTRAVENOUS
  Filled 2014-10-29: qty 2

## 2014-10-29 MED ORDER — IOHEXOL 300 MG/ML  SOLN
100.0000 mL | Freq: Once | INTRAMUSCULAR | Status: AC | PRN
Start: 2014-10-29 — End: 2014-10-29
  Administered 2014-10-29: 100 mL via INTRAVENOUS

## 2014-10-29 MED ORDER — HYDROMORPHONE HCL 1 MG/ML IJ SOLN
1.0000 mg | Freq: Once | INTRAMUSCULAR | Status: AC
  Administered 2014-10-29: 1 mg via INTRAVENOUS
  Filled 2014-10-29: qty 1

## 2014-10-29 NOTE — ED Provider Notes (Signed)
Signout by Dr. Colin Rhein pending CT scan.  CT scan largely unremarkable. 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 Discussed this with the patient. He will follow-up with his primary physician and will be given referral to GI for endoscopy to further evaluate.  Patient reports improvement of symptoms after symptomatically controlled the ER. He has been able to tolerate fluids by mouth.   After history, exam, and medical workup I feel the patient has been appropriately medically screened and is safe for discharge home. Pertinent diagnoses were discussed with the patient. Patient was given return precautions.   Results for orders placed or performed during the hospital encounter of 10/29/14  Comprehensive metabolic panel  Result Value Ref Range   Sodium 137 135 - 145 mmol/L   Potassium 3.4 (L) 3.5 - 5.1 mmol/L   Chloride 103 96 - 112 mmol/L   CO2 25 19 - 32 mmol/L   Glucose, Bld 159 (H) 70 - 99 mg/dL   BUN 12 6 - 23 mg/dL   Creatinine, Ser 0.84 0.50 - 1.35 mg/dL   Calcium 9.3 8.4 - 10.5 mg/dL   Total Protein 7.8 6.0 - 8.3 g/dL   Albumin 4.4 3.5 - 5.2 g/dL   AST 240 (H) 0 - 37 U/L   ALT 108 (H) 0 - 53 U/L   Alkaline Phosphatase 83 39 - 117 U/L   Total Bilirubin 2.0 (H) 0.3 - 1.2 mg/dL   GFR calc non Af Amer >90 >90 mL/min   GFR calc Af Amer >90 >90 mL/min   Anion gap 9 5 - 15  Lipase, blood  Result Value Ref Range   Lipase 18 11 - 59 U/L  CBC with Differential  Result Value Ref Range   WBC 12.0 (H) 4.0 - 10.5 K/uL   RBC 4.97 4.22 - 5.81 MIL/uL   Hemoglobin 14.7 13.0 - 17.0 g/dL   HCT 42.2 39.0 - 52.0 %   MCV 84.9 78.0 - 100.0 fL   MCH 29.6 26.0 - 34.0 pg   MCHC 34.8 30.0 - 36.0 g/dL   RDW 12.4 11.5 - 15.5 %   Platelets 254 150 - 400 K/uL   Neutrophils Relative % 81 (H) 43 - 77 %   Neutro Abs 9.7 (H) 1.7 - 7.7 K/uL   Lymphocytes Relative 6 (L) 12 - 46 %   Lymphs Abs 0.7 0.7 - 4.0 K/uL   Monocytes Relative 12 3 - 12 %   Monocytes Absolute 1.4 (H) 0.1 - 1.0 K/uL   Eosinophils Relative 1 0 - 5 %   Eosinophils Absolute 0.1 0.0 - 0.7 K/uL   Basophils Relative 0 0 - 1 %   Basophils Absolute 0.0 0.0 - 0.1 K/uL  Urinalysis, Routine w reflex microscopic  Result Value Ref Range   Color, Urine YELLOW YELLOW   APPearance CLEAR CLEAR   Specific Gravity, Urine 1.028 1.005 - 1.030   pH 6.0 5.0 - 8.0   Glucose, UA NEGATIVE NEGATIVE mg/dL   Hgb urine dipstick NEGATIVE NEGATIVE   Bilirubin Urine NEGATIVE NEGATIVE   Ketones, ur NEGATIVE NEGATIVE mg/dL   Protein, ur NEGATIVE NEGATIVE mg/dL   Urobilinogen, UA 1.0 0.0 - 1.0 mg/dL   Nitrite NEGATIVE NEGATIVE   Leukocytes, UA NEGATIVE NEGATIVE   Ct Abdomen Pelvis W Contrast  10/29/2014   CLINICAL DATA:  44 year old male with epigastric and right upper quadrant pain since last night. Loose stools. Elevated white count. Hypertension. Subsequent encounter.  EXAM: CT ABDOMEN AND PELVIS WITH CONTRAST  TECHNIQUE: Multidetector CT imaging of the abdomen and pelvis was performed using the standard protocol following bolus administration of intravenous contrast.  CONTRAST:  138mL OMNIPAQUE IOHEXOL 300 MG/ML  SOLN  COMPARISON:  Right upper quadrant ultrasound 10/29/2014.  FINDINGS: No extra luminal bowel inflammatory process, free fluid or free air. Specifically, no inflammation surrounds the appendix or terminal ileum. The stomach is slightly prominent size. No obstructing lesion or ulceration identified. Slight reflux into the distal esophagus with minimal wall thickening.  No bowel containing hernia.  Bibasilar atelectasis.  Heart size top-normal.  Fatty liver without worrisome hepatic lesion. Partially contracted gallbladder. No calcified stone identified.  At the level of the ampulla, there is slight polypoid projection into the duodenum. Although it is possible this is normal, mass cannot be excluded. Pancreas is otherwise unremarkable.  Right upper pole tiny nonobstructing renal calculi. Left renal cysts largest measuring up to  4.3 cm.  No abdominal aortic aneurysm.  No adenopathy.  Noncontrast filled views of the urinary bladder unremarkable. Prostate gland within normal limits.  No osseous abnormality.  IMPRESSION: No extra luminal bowel inflammatory process, free fluid or free air. Specifically, no inflammation surrounds the appendix or terminal ileum.  The stomach is slightly prominent in size. No obstructing lesion or ulceration identified. Slight reflux into the distal esophagus with minimal wall thickening.  At the level of the ampulla, there is slight polypoid projection into the duodenum. Although it is possible this is normal, mass cannot be excluded. Pancreas is otherwise unremarkable.  Fatty liver.  Partially contracted gallbladder. No calcified gallstone identified.  Right upper pole tiny nonobstructing renal calculi. Left renal cysts largest measuring up to 4.3 cm.  Bibasilar atelectasis.   Electronically Signed   By: Genia Del M.D.   On: 10/29/2014 08:36   US Abdomen Limited  10/29/2014   CLINICAL DATA:  Acute onset of right upper quadrant abdominal pain. Initial encounter.  EXAM: US ABDOMEN LIMITED - RIGHT UPPER QUADRANT  COMPARISON:  None.  FINDINGS: Gallbladder:  The gallbladder is contracted. There is suggestion of a small 0.5 cm stone within the gallbladder. Mild gallbladder wall thickening at the fundus of the gallbladder may simply reflect decompression. No ultrasonographic Murphy's sign is elicited. No pericholecystic fluid is seen.  Common bile duct:  Diameter: 0.6 cm, within normal limits in caliber.  Liver:  No focal lesion identified. Diffusely increased parenchymal echogenicity and coarsened echotexture, likely reflecting fatty infiltration.  IMPRESSION: 1. No acute abnormality seen at the right upper quadrant. 2. Gallbladder contracted, with a small stone in the gallbladder. Mild fundal gallbladder wall thickening may simply reflect decompression. No evidence for obstruction or cholecystitis. 3. Diffuse  fatty infiltration within the liver.   Electronically Signed   By: Garald Balding M.D.   On: 10/29/2014 06:09      Merryl Hacker, MD 10/29/14 1003

## 2014-10-29 NOTE — ED Notes (Signed)
Patient transported to CT 

## 2014-10-29 NOTE — Discharge Instructions (Signed)
You were seen today for abdominal pain. The cause of your pain is not exactly known. Your CT scan and ultrasound do not show a Colles. However, your CT date scan did show a possible small polyp versus mass of the pancreas. You need to see gastroenterology and have an endoscopy to evaluate this further. Follow-up with your primary physician for repeat evaluation and monitoring of LFTs.  Abdominal Pain Many things can cause abdominal pain. Usually, abdominal pain is not caused by a disease and will improve without treatment. It can often be observed and treated at home. Your health care provider will do a physical exam and possibly order blood tests and X-rays to help determine the seriousness of your pain. However, in many cases, more time must pass before a clear cause of the pain can be found. Before that point, your health care provider may not know if you need more testing or further treatment. HOME CARE INSTRUCTIONS  Monitor your abdominal pain for any changes. The following actions may help to alleviate any discomfort you are experiencing:  Only take over-the-counter or prescription medicines as directed by your health care provider.  Do not take laxatives unless directed to do so by your health care provider.  Try a clear liquid diet (broth, tea, or water) as directed by your health care provider. Slowly move to a bland diet as tolerated. SEEK MEDICAL CARE IF:  You have unexplained abdominal pain.  You have abdominal pain associated with nausea or diarrhea.  You have pain when you urinate or have a bowel movement.  You experience abdominal pain that wakes you in the night.  You have abdominal pain that is worsened or improved by eating food.  You have abdominal pain that is worsened with eating fatty foods.  You have a fever. SEEK IMMEDIATE MEDICAL CARE IF:   Your pain does not go away within 2 hours.  You keep throwing up (vomiting).  Your pain is felt only in portions of the  abdomen, such as the right side or the left lower portion of the abdomen.  You pass bloody or black tarry stools. MAKE SURE YOU:  Understand these instructions.   Will watch your condition.   Will get help right away if you are not doing well or get worse.  Document Released: 03/31/2005 Document Revised: 06/26/2013 Document Reviewed: 02/28/2013 [facility] Patient Information 2015 [place], [place]. This information is not intended to replace advice given to you by your health care provider. Make sure you discuss any questions you have with your health care provider.

## 2014-10-29 NOTE — ED Notes (Signed)
Pt states he is having abd pain  Pt states his stomach has been feeling gassy all day but tonight around midnight the pain got worse  Pt states he had chills earlier so he took a hot shower and got in bed  Pt states he was able to doze off for about half an hour but the pain is so bad it woke him up   Pt states the pain is causing him to breathe shallow

## 2014-10-29 NOTE — ED Provider Notes (Signed)
CSN: [id number]     Arrival date & time 10/29/14  0424 History   First MD Initiated Contact with Patient 10/29/14 [phone]     Chief Complaint  Patient presents with  . Abdominal Pain     (Consider location/radiation/quality/duration/timing/severity/associated sxs/prior Treatment) Patient is a 44 y.o. male presenting with abdominal pain.  Abdominal Pain Pain location:  Epigastric and RUQ Pain quality: sharp   Pain radiates to:  Does not radiate Pain severity:  Severe Onset quality:  Gradual Duration:  1 day Timing:  Constant Progression:  Worsening Chronicity:  New Relieved by:  Nothing Worsened by:  Palpation, movement and eating Associated symptoms: anorexia, diarrhea and dysuria   Associated symptoms: no chest pain, no constipation, no fever, no nausea and no vomiting     Past Medical History  Diagnosis Date  . Allergic rhinitis   . GERD (gastroesophageal reflux disease)   . Pre-hypertension   . ALLERGIC RHINITIS 01/19/2007  . GERD 01/19/2007  . Sleep apnea 01/29/2011  . Hypertension     Slight   Past Surgical History  Procedure Laterality Date  . Left heart catheterization with coronary angiogram N/A 12/11/2013    Procedure: LEFT HEART CATHETERIZATION WITH CORONARY ANGIOGRAM;  Surgeon: Josue Hector, MD;  Location[facility] C[facility] LAB;  Service: Cardiovascular;  Laterality: N/A;   Family History  Problem Relation Age of Onset  . Diabetes Maternal Grandmother   . Heart disease Maternal Grandmother   . Stroke Maternal Grandfather   . Hypertension Mother   . Heart disease Father 63  . Hyperlipidemia Father    History  Substance Use Topics  . Smoking status: Never Smoker   . Smokeless tobacco: Never Used  . Alcohol Use: 0.0 oz/week    0 Standard drinks or equivalent per week     Comment: occ    Review of Systems  Constitutional: Negative for fever.  Cardiovascular: Negative for chest pain.  Gastrointestinal: Positive for abdominal pain, diarrhea and anorexia. Negative  for nausea, vomiting and constipation.  Genitourinary: Positive for dysuria.  All other systems reviewed and are negative.     Allergies  Review of patient's allergies indicates no known allergies.  Home Medications   Prior to Admission medications   Medication Sig Start Date End Date Taking? Authorizing Provider  Aspirin-Acetaminophen-Caffeine (GOODY HEADACHE PO) Take 1 packet by mouth daily as needed (for pain).   Yes Historical Provider, MD  mometasone (NASONEX) 50 MCG/ACT nasal spray Place 2 sprays into the nose daily. Patient taking differently: Place 2 sprays into the nose daily as needed. Seasonal allergies 10/10/14  Yes Amy Cletis [place], MD  lansoprazole (PREVACID) 30 MG capsule Take 1 capsule (30 mg total) by mouth daily as needed (for indigestion). 11/20/13   Janith Lima, MD  losartan (COZAAR) 25 MG tablet Take 1 tablet (25 mg total) by mouth daily. 07/31/14   Josue Hector, MD  montelukast (SINGULAIR) 10 MG tablet Take 1 tablet (10 mg total) by mouth at bedtime. 10/10/14   Amy Cletis [place], MD  oxyCODONE-acetaminophen (PERCOCET/ROXICET) 5-325 MG per tablet Take 1 tablet by mouth every 6 (six) hours as needed for severe pain. 10/29/14   Merryl Hacker, MD   BP 119/72 mmHg  Pulse 72  Temp(Src) 98.4 F (36.9 C) (Oral)  Resp 16  Ht 5\' 10"  (1.778 m)  Wt 193 lb (87.544 kg)  BMI 27.69 kg/m2  SpO2 98% Physical Exam  Constitutional: He is oriented to person, place, and time. He appears well-developed and  well-nourished.  HENT:  Head: Normocephalic and atraumatic.  Eyes: Conjunctivae and EOM are normal.  Neck: Normal range of motion. Neck supple.  Cardiovascular: Normal rate, regular rhythm and normal heart sounds.   Pulmonary/Chest: Effort normal and breath sounds normal. No respiratory distress.  Abdominal: He exhibits no distension. There is tenderness in the right upper quadrant and epigastric area. There is no rebound and no guarding.  Musculoskeletal: Normal range of motion.   Neurological: He is alert and oriented to person, place, and time.  Skin: Skin is warm and dry.  Vitals reviewed.   ED Course  Procedures (including critical care time) Labs Review Labs Reviewed  COMPREHENSIVE METABOLIC PANEL - Abnormal; Notable for the following:    Potassium 3.4 (*)    Glucose, Bld 159 (*)    AST 240 (*)    ALT 108 (*)    Total Bilirubin 2.0 (*)    All other components within normal limits  CBC WITH DIFFERENTIAL/PLATELET - Abnormal; Notable for the following:    WBC 12.0 (*)    Neutrophils Relative % 81 (*)    Neutro Abs 9.7 (*)    Lymphocytes Relative 6 (*)    Monocytes Absolute 1.4 (*)    All other components within normal limits  LIPASE, BLOOD  URINALYSIS, ROUTINE W REFLEX MICROSCOPIC    Imaging Review Ct Abdomen Pelvis W Contrast  10/29/2014   CLINICAL DATA:  44 year old male with epigastric and right upper quadrant pain since last night. Loose stools. Elevated white count. Hypertension. Subsequent encounter.  EXAM: CT ABDOMEN AND PELVIS WITH CONTRAST  TECHNIQUE: Multidetector CT imaging of the abdomen and pelvis was performed using the standard protocol following bolus administration of intravenous contrast.  CONTRAST:  162mL OMNIPAQUE IOHEXOL 300 MG/ML  SOLN  COMPARISON:  Right upper quadrant ultrasound 10/29/2014.  FINDINGS: No extra luminal bowel inflammatory process, free fluid or free air. Specifically, no inflammation surrounds the appendix or terminal ileum. The stomach is slightly prominent size. No obstructing lesion or ulceration identified. Slight reflux into the distal esophagus with minimal wall thickening.  No bowel containing hernia.  Bibasilar atelectasis.  Heart size top-normal.  Fatty liver without worrisome hepatic lesion. Partially contracted gallbladder. No calcified stone identified.  At the level of the ampulla, there is slight polypoid projection into the duodenum. Although it is possible this is normal, mass cannot be excluded. Pancreas  is otherwise unremarkable.  Right upper pole tiny nonobstructing renal calculi. Left renal cysts largest measuring up to 4.3 cm.  No abdominal aortic aneurysm.  No adenopathy.  Noncontrast filled views of the urinary bladder unremarkable. Prostate gland within normal limits.  No osseous abnormality.  IMPRESSION: No extra luminal bowel inflammatory process, free fluid or free air. Specifically, no inflammation surrounds the appendix or terminal ileum.  The stomach is slightly prominent in size. No obstructing lesion or ulceration identified. Slight reflux into the distal esophagus with minimal wall thickening.  At the level of the ampulla, there is slight polypoid projection into the duodenum. Although it is possible this is normal, mass cannot be excluded. Pancreas is otherwise unremarkable.  Fatty liver.  Partially contracted gallbladder. No calcified gallstone identified.  Right upper pole tiny nonobstructing renal calculi. Left renal cysts largest measuring up to 4.3 cm.  Bibasilar atelectasis.   Electronically Signed   By: Genia Del M.D.   On: 10/29/2014 08:36   US Abdomen Limited  10/29/2014   CLINICAL DATA:  Acute onset of right upper quadrant abdominal pain. Initial encounter.  EXAM: US ABDOMEN LIMITED - RIGHT UPPER QUADRANT  COMPARISON:  None.  FINDINGS: Gallbladder:  The gallbladder is contracted. There is suggestion of a small 0.5 cm stone within the gallbladder. Mild gallbladder wall thickening at the fundus of the gallbladder may simply reflect decompression. No ultrasonographic Murphy's sign is elicited. No pericholecystic fluid is seen.  Common bile duct:  Diameter: 0.6 cm, within normal limits in caliber.  Liver:  No focal lesion identified. Diffusely increased parenchymal echogenicity and coarsened echotexture, likely reflecting fatty infiltration.  IMPRESSION: 1. No acute abnormality seen at the right upper quadrant. 2. Gallbladder contracted, with a small stone in the gallbladder. Mild  fundal gallbladder wall thickening may simply reflect decompression. No evidence for obstruction or cholecystitis. 3. Diffuse fatty infiltration within the liver.   Electronically Signed   By: Garald Balding M.D.   On: 10/29/2014 06:09     EKG Interpretation None      MDM   Final diagnoses:  Abdominal pain    44 y.o. male with pertinent PMH of HTN presents with epigastric, RUQ abd pain x 24 hours.  No upper gi symptoms, but pt has had diarrhea and anorexia.  Exam as above.    Elba Barman initially with unremarkable [place].  CT scan ordered.  Pt care to Dr. Dina Rich pending final dispo.  I have reviewed all laboratory and imaging studies if ordered as above  1. Abdominal pain         Debby Freiberg, MD 10/29/14 [phone]

## 2014-10-30 ENCOUNTER — Telehealth: Payer: Self-pay | Admitting: Family Medicine

## 2014-10-30 DIAGNOSIS — R935 Abnormal findings on diagnostic imaging of other abdominal regions, including retroperitoneum: Secondary | ICD-10-CM

## 2014-10-30 NOTE — Telephone Encounter (Signed)
Pt called: stated he was seen in ER yesterday and was told to make appt with Dr. Deatra Ina ([facility]) as soon as possible for follow up. Pt called [facility] and was told he needed to have a referral from PCP. This is not due for insurance purposes b/c [employer] does not require referrals. Pt states they did ultrasound and CT and they found polyp on pancreas. Pt is not in any pain right now. Does the pt need to make appt with you first and then GI? Please advise

## 2014-10-31 ENCOUNTER — Encounter: Payer: Self-pay | Admitting: Physician Assistant

## 2014-10-31 NOTE — Telephone Encounter (Signed)
I think he should be able to be seen there directly without seeing me first. I will put in a referral.

## 2014-10-31 NOTE — Telephone Encounter (Signed)
Spoke to [place] at [facility]; she is going to call pt directly to schedule. I also informed pt [facility] will be calling him.

## 2014-11-05 ENCOUNTER — Ambulatory Visit: Payer: Federal, State, Local not specified - PPO | Admitting: Family Medicine

## 2014-11-05 ENCOUNTER — Telehealth: Payer: Self-pay | Admitting: Family Medicine

## 2014-11-05 NOTE — Telephone Encounter (Signed)
Patient did not come in for their appointment today for acute (no notes given).  Please let me know if patient needs to be contacted immediately for follow up or no follow up needed.

## 2014-11-05 NOTE — Telephone Encounter (Signed)
Patient did not come in for their appointment today for .  Please let me know if patient needs to be contacted immediately for follow up or no follow up needed.

## 2014-11-05 NOTE — Telephone Encounter (Signed)
Appointment cancelled

## 2014-11-05 NOTE — Telephone Encounter (Signed)
Pt's appointment should have been canceled by Ebony Hail when he was set up for referral to GI. Please make sure he is not charged. He does not need an appt here.

## 2014-11-13 ENCOUNTER — Ambulatory Visit (INDEPENDENT_AMBULATORY_CARE_PROVIDER_SITE_OTHER): Payer: Federal, State, Local not specified - PPO | Admitting: Physician Assistant

## 2014-11-13 ENCOUNTER — Encounter: Payer: Self-pay | Admitting: Physician Assistant

## 2014-11-13 ENCOUNTER — Other Ambulatory Visit (INDEPENDENT_AMBULATORY_CARE_PROVIDER_SITE_OTHER): Payer: Federal, State, Local not specified - PPO

## 2014-11-13 VITALS — BP 122/78 | HR 80 | Ht 70.0 in | Wt 188.0 lb

## 2014-11-13 DIAGNOSIS — R7989 Other specified abnormal findings of blood chemistry: Secondary | ICD-10-CM | POA: Diagnosis not present

## 2014-11-13 DIAGNOSIS — R945 Abnormal results of liver function studies: Principal | ICD-10-CM

## 2014-11-13 DIAGNOSIS — R938 Abnormal findings on diagnostic imaging of other specified body structures: Secondary | ICD-10-CM

## 2014-11-13 DIAGNOSIS — R1013 Epigastric pain: Secondary | ICD-10-CM

## 2014-11-13 DIAGNOSIS — R9389 Abnormal findings on diagnostic imaging of other specified body structures: Secondary | ICD-10-CM

## 2014-11-13 LAB — CBC WITH DIFFERENTIAL/PLATELET
BASOS ABS: 0 10*3/uL (ref 0.0–0.1)
Basophils Relative: 0.4 % (ref 0.0–3.0)
Eosinophils Absolute: 0.2 10*3/uL (ref 0.0–0.7)
Eosinophils Relative: 2.9 % (ref 0.0–5.0)
HCT: 44.7 % (ref 39.0–52.0)
HEMOGLOBIN: 15.4 g/dL (ref 13.0–17.0)
LYMPHS ABS: 1.8 10*3/uL (ref 0.7–4.0)
Lymphocytes Relative: 25.2 % (ref 12.0–46.0)
MCHC: 34.6 g/dL (ref 30.0–36.0)
MCV: 84.3 fl (ref 78.0–100.0)
MONO ABS: 0.5 10*3/uL (ref 0.1–1.0)
Monocytes Relative: 7.2 % (ref 3.0–12.0)
NEUTROS ABS: 4.7 10*3/uL (ref 1.4–7.7)
Neutrophils Relative %: 64.3 % (ref 43.0–77.0)
Platelets: 336 10*3/uL (ref 150.0–400.0)
RBC: 5.3 Mil/uL (ref 4.22–5.81)
RDW: 12.8 % (ref 11.5–15.5)
WBC: 7.2 10*3/uL (ref 4.0–10.5)

## 2014-11-13 LAB — COMPREHENSIVE METABOLIC PANEL
ALBUMIN: 4 g/dL (ref 3.5–5.2)
ALT: 23 U/L (ref 0–53)
AST: 18 U/L (ref 0–37)
Alkaline Phosphatase: 68 U/L (ref 39–117)
BUN: 9 mg/dL (ref 6–23)
CO2: 27 meq/L (ref 19–32)
CREATININE: 0.71 mg/dL (ref 0.40–1.50)
Calcium: 9.7 mg/dL (ref 8.4–10.5)
Chloride: 103 mEq/L (ref 96–112)
GFR: 153.78 mL/min (ref >60.00)
GLUCOSE: 149 mg/dL — AB (ref 70–99)
Potassium: 3.9 mEq/L (ref 3.5–5.1)
Sodium: 138 mEq/L (ref 135–145)
Total Bilirubin: 0.7 mg/dL (ref 0.2–1.2)
Total Protein: 7.5 g/dL (ref 6.0–8.3)

## 2014-11-13 LAB — LIPASE: Lipase: 13 U/L (ref 11.0–59.0)

## 2014-11-13 NOTE — Progress Notes (Signed)
Patient ID: Paul Flowers, male   DOB: 1971/03/25, 44 y.o.   MRN: [id number]   Subjective:    Patient ID: Paul Flowers, male    DOB: [birth year], 44 y.o.   MRN: [id number]  Paul Flowers is a very nice 44 year old African-American male new to GI today referred by Dr. Eliezer Flowers for evaluation of recent episode of severe abdominal pain and abnormal CT scan. Patient has no prior GI history. He does have history of hypertension and sleep apnea. Patient says he has had some problems with acid reflux and has Prevacid which she uses periodically though not everyday. He had an episode of severe acute epigastric pain on 10/29/2014 which she says is much different than anything he has ever had before. This pain was sharp epigastric without radiation was associated with some chills but no fever no nausea or vomiting and no diarrhea. Pain lasted for several hours by the time he had gone to the emergency room and subsided after he was given Dilaudid. He has not had any recurrence of the severe pain since but has had some mild epigastric discomfort. He underwent upper abdominal ultrasound which questioned a small stone in the gallbladder and a somewhat contracted gallbladder no ductal dilation.  CT of the abdomen and pelvis which was then done and showed a prominent stomach slight reflux into the distal esophagus,fatty liver, and a partially contracted GB and at the level of the ampulla there was a slight polypoid projection into the duodenum cannot rule out mass pancreas otherwise unremarkable. Labs done during that ER visit total bilirubin of 2 AST of 240 ALT of 108 and WBC was elevated at.12.0, normal lipase. Review of chart shows that he did have LFTs done in March 2016 which were normal.,    Review of Systems Pertinent positive and negative review of systems were noted in the above HPI section.  All other review of systems was otherwise negative.  Outpatient Encounter Prescriptions as of 11/13/2014  Medication  Sig  . Aspirin-Acetaminophen-Caffeine (GOODY HEADACHE PO) Take 1 packet by mouth daily as needed (for pain).  Marland Kitchen lansoprazole (PREVACID) 30 MG capsule Take 1 capsule (30 mg total) by mouth daily as needed (for indigestion). (Patient taking differently: Take 30 mg by mouth daily. )  . losartan (COZAAR) 25 MG tablet Take 1 tablet (25 mg total) by mouth daily.  . mometasone (NASONEX) 50 MCG/ACT nasal spray Place 2 sprays into the nose daily. (Patient taking differently: Place 2 sprays into the nose daily as needed. Seasonal allergies)  . montelukast (SINGULAIR) 10 MG tablet Take 1 tablet (10 mg total) by mouth at bedtime.  . [DISCONTINUED] oxyCODONE-acetaminophen (PERCOCET/ROXICET) 5-325 MG per tablet Take 1 tablet by mouth every 6 (six) hours as needed for severe pain.   No facility-administered encounter medications on file as of 11/13/2014.   No Known Allergies Patient Active Problem List   Diagnosis Date Noted  . Hyperglycemia 09/24/2014  . HTN (hypertension) 11/09/2013  . Routine health maintenance 02/12/2013  . OSA (obstructive sleep apnea) 03/19/2011  . Allergic rhinitis 01/19/2007  . GERD 01/19/2007   History   Social History  . Marital Status: Divorced    Spouse Name: N/A  . Number of Children: 2  . Years of Education: N/A   Occupational History  . supervisor [employer]   Social History Main Topics  . Smoking status: Never Smoker   . Smokeless tobacco: Never Used  . Alcohol Use: 0.0 oz/week    0 Standard  drinks or equivalent per week     Comment: occ  . Drug Use: No  . Sexual Activity: Not on file   Other Topics Concern  . Not on file   Social History Narrative   Marital Status: Divorced '96, remarried '98   Children: son , dtr    Occupation: superviser   Hobbies: bowls 2 x a week/ floor exercise   Never smoked    Alcohol- 2 drinks per day          Paul Flowers family history includes Colonic polyp in his father; Diabetes in his maternal grandmother;  Heart disease in his maternal grandmother; Heart disease (age of onset: 43) in his father; Hyperlipidemia in his father; Hypertension in his mother; Stroke in his maternal grandfather. There is no history of Colon cancer.      Objective:    Filed Vitals:   11/13/14 1046  BP: 122/78  Pulse: 80    Physical Exam  well-developed African-American male in no acute distress, pleasant blood pressure 122/78 pulse 80 height 5 foot 10 weight 188. HEENT ;nontraumatic normocephalic EOMI PERRLA sclera anicteric, Supple ;no JVD, Cardiovascular ;regular rate and rhythm with S1-S2 no murmur or gallop, Pulm; clear bilaterally, Abdomen; soft some minimal tenderness in the epigastrium there is no guarding or rebound no palpable mass or hepatosplenomegaly bowel sounds are present, Rectal; exam not done, Exts ;no clubbing cyanosis or edema skin warm and dry, Psych ;mood and affect appropriate       Assessment & Plan:   #1 44 yo male with recent episode of severe epigastric pain which has not recurred. Workup thus far has revealed elevated LFT's, possible small gallstone, and a polypoid projection from the ampulla. ? Passed small stone, vs ampullary lesion  #2 fatty liver #3 OSA #4 HTN #5 GERD  Plan; will repeat labs today Schedule for MRCP- further plans pending results of above. He may need EGD with bx, EUS and or ERCP depending on findings and these procedures were discussed with pt.  He has percocet at home to use if needed and encouraged pt to seek ER care if recurrent severe pain.   Paul Vanhorn S Jacobs Golab PA-C 11/13/2014   Cc: Paul Sanders, MD

## 2014-11-13 NOTE — Patient Instructions (Addendum)
We have given you a brochure with ERCP information. ( Endoscopic retrograde cholangiopancreatography).  We will call you with the lab results.  You have been scheduled for an MRCP of the abdomen at [facility] on Tuesday 11-19-14. Your appointment time is 9:00 am . Please arrive at 8:45 am prior to your appointment time for registration purposes. Please make certain not to have anything to eat or drink 4 hours prior to your test. In addition, if you have any metal in your body, have a pacemaker or defibrillator, please be sure to let your ordering physician know. This test typically takes 45 minutes to 1 hour to complete.

## 2014-11-14 NOTE — Progress Notes (Signed)
Case discussed in detail with physician assistant. Agree with assessment and plans as I directed

## 2014-11-19 ENCOUNTER — Ambulatory Visit (HOSPITAL_COMMUNITY)
Admission: RE | Admit: 2014-11-19 | Discharge: 2014-11-19 | Disposition: A | Discharge status: Home / Self Care | Payer: Federal, State, Local not specified - PPO | Source: Ambulatory Visit | Attending: Physician Assistant | Admitting: Physician Assistant

## 2014-11-19 ENCOUNTER — Other Ambulatory Visit: Payer: Self-pay | Admitting: Physician Assistant

## 2014-11-19 DIAGNOSIS — R1013 Epigastric pain: Secondary | ICD-10-CM

## 2014-11-19 DIAGNOSIS — R945 Abnormal results of liver function studies: Principal | ICD-10-CM

## 2014-11-19 DIAGNOSIS — R7989 Other specified abnormal findings of blood chemistry: Secondary | ICD-10-CM | POA: Insufficient documentation

## 2014-11-19 DIAGNOSIS — R9389 Abnormal findings on diagnostic imaging of other specified body structures: Secondary | ICD-10-CM

## 2014-11-19 DIAGNOSIS — N281 Cyst of kidney, acquired: Secondary | ICD-10-CM | POA: Diagnosis not present

## 2014-11-19 DIAGNOSIS — K760 Fatty (change of) liver, not elsewhere classified: Secondary | ICD-10-CM | POA: Diagnosis not present

## 2014-11-19 MED ORDER — GADOBENATE DIMEGLUMINE 529 MG/ML IV SOLN
20.0000 mL | Freq: Once | INTRAVENOUS | Status: AC
  Administered 2014-11-19: 17 mL via INTRAVENOUS

## 2014-11-22 ENCOUNTER — Other Ambulatory Visit: Payer: Self-pay

## 2014-11-22 DIAGNOSIS — R1013 Epigastric pain: Secondary | ICD-10-CM

## 2014-11-22 DIAGNOSIS — R935 Abnormal findings on diagnostic imaging of other abdominal regions, including retroperitoneum: Secondary | ICD-10-CM

## 2014-11-26 ENCOUNTER — Ambulatory Visit (HOSPITAL_COMMUNITY): Payer: Federal, State, Local not specified - PPO

## 2014-11-27 ENCOUNTER — Other Ambulatory Visit (INDEPENDENT_AMBULATORY_CARE_PROVIDER_SITE_OTHER): Payer: Federal, State, Local not specified - PPO

## 2014-11-27 DIAGNOSIS — R1013 Epigastric pain: Secondary | ICD-10-CM | POA: Diagnosis not present

## 2014-11-27 DIAGNOSIS — R935 Abnormal findings on diagnostic imaging of other abdominal regions, including retroperitoneum: Secondary | ICD-10-CM

## 2014-11-27 LAB — HEPATIC FUNCTION PANEL
ALT: 22 U/L (ref 0–53)
AST: 17 U/L (ref 0–37)
Albumin: 4.1 g/dL (ref 3.5–5.2)
Alkaline Phosphatase: 67 U/L (ref 39–117)
Bilirubin, Direct: 0.1 mg/dL (ref 0.0–0.3)
Total Bilirubin: 0.7 mg/dL (ref 0.2–1.2)
Total Protein: 7.5 g/dL (ref 6.0–8.3)

## 2014-12-03 ENCOUNTER — Ambulatory Visit (INDEPENDENT_AMBULATORY_CARE_PROVIDER_SITE_OTHER): Payer: Federal, State, Local not specified - PPO | Admitting: Family Medicine

## 2014-12-03 ENCOUNTER — Encounter: Payer: Self-pay | Admitting: Family Medicine

## 2014-12-03 VITALS — BP 124/84 | HR 84 | Temp 98.6°F | Ht 70.0 in | Wt 188.2 lb

## 2014-12-03 DIAGNOSIS — R1013 Epigastric pain: Secondary | ICD-10-CM | POA: Insufficient documentation

## 2014-12-03 DIAGNOSIS — M79674 Pain in right toe(s): Secondary | ICD-10-CM | POA: Diagnosis not present

## 2014-12-03 DIAGNOSIS — I10 Essential (primary) hypertension: Secondary | ICD-10-CM

## 2014-12-03 DIAGNOSIS — Q6100 Congenital renal cyst, unspecified: Secondary | ICD-10-CM | POA: Diagnosis not present

## 2014-12-03 DIAGNOSIS — N281 Cyst of kidney, acquired: Secondary | ICD-10-CM | POA: Insufficient documentation

## 2014-12-03 NOTE — Patient Instructions (Addendum)
Stop at front desk for referral to sports med Dr. Lorelei Pont.  Work on [employer], exercise as tolerated.  Schedule CPX in 6 months with labs prior.

## 2014-12-03 NOTE — Assessment & Plan Note (Signed)
Well controlled. Continue current medication. Encouraged exercise, weight loss, healthy eating habits.  

## 2014-12-03 NOTE — Assessment & Plan Note (Signed)
MRCP unrevealing.  No stone.  LFTs back in nml range.   No current pain... May need EDG if [pain recurs.  Pt has follow up with GI.

## 2014-12-03 NOTE — Progress Notes (Signed)
Pre visit review using our clinic review tool, if applicable. No additional management support is needed unless otherwise documented below in the visit note. 

## 2014-12-03 NOTE — Assessment & Plan Note (Signed)
Refer to Dr. Lorelei Pont for further eval.

## 2014-12-03 NOTE — Assessment & Plan Note (Signed)
No further eval needed. 

## 2014-12-03 NOTE — Progress Notes (Signed)
   Subjective:    Patient ID: Paul Flowers, male    DOB: [birth year], 44 y.o.   MRN: [id number]  HPI   44 year old male with history of  OSA, GERD, HTN, presents for follow up.  Started on cozaar 25 mg in 07/31/2014  ER visit in 4/26/206. Epigastric abd and chest pain.  Saw Dr.  Johnsie Cancel in follow up 10/2014 after acute chest pain. Cath 12/11/13 no CAD normal EF.  Followed up with GI... [place] nml, Had MRCP to look at possible small gallstone, and a polypoid projection from the ampulla.  Elevated LFTs. IMPRESSION:  1. Geographic hepatic steatosis.  2. No significant biliary abnormality is identified. No ampullary  mass is visible.  3. Benign Bosniak category 1 left renal cysts.  Pain resolved now!  Hypertension:   Well controlled on current meds.   BP Readings from Last 3 Encounters:  12/03/14 124/84  11/13/14 122/78  10/29/14 119/72  Using medication without problems or lightheadedness: None Chest pain with exertion: Yes,see above Edema:None Short of breath:None Average home BPs: wel controlled.    Wt Readings from Last 3 Encounters:  12/03/14 188 lb 4 oz (85.39 kg)  11/19/14 188 lb (85.276 kg)  11/13/14 188 lb (85.276 kg)    Other issues: Chronic right toe pain x years. In past seeing  Dr. Jana Half Angelonious. Podiatry  Was previously a lead bowler, now cannot do to pain. Dx with sesaomoiditis.  Orthotics not helping.  Wants to have second opinion.    Review of Systems  Constitutional: Negative for fatigue.  HENT: Negative for ear pain.   Eyes: Negative for pain.  Respiratory: Negative for cough and shortness of breath.   Cardiovascular: Negative for chest pain.  Gastrointestinal: Negative for abdominal pain.       Objective:   Physical Exam  Constitutional: Vital signs are normal. He appears well-developed and well-nourished.  HENT:  Head: Normocephalic.  Right Ear: Hearing normal.  Left Ear: Hearing normal.  Nose: Nose normal.  Mouth/Throat: Oropharynx is  clear and moist and mucous membranes are normal.  Neck: Trachea normal. Carotid bruit is not present. No thyroid mass and no thyromegaly present.  Cardiovascular: Normal rate, regular rhythm and normal pulses.  Exam reveals no gallop, no distant heart sounds and no friction rub.   No murmur heard. No peripheral edema  Pulmonary/Chest: Effort normal and breath sounds normal. No respiratory distress.  Skin: Skin is warm, dry and intact. No rash noted.  Psychiatric: He has a normal mood and affect. His speech is normal and behavior is normal. Thought content normal.          Assessment & Plan:

## 2014-12-04 ENCOUNTER — Encounter: Payer: Self-pay | Admitting: Physician Assistant

## 2014-12-04 ENCOUNTER — Ambulatory Visit (INDEPENDENT_AMBULATORY_CARE_PROVIDER_SITE_OTHER): Payer: Federal, State, Local not specified - PPO | Admitting: Physician Assistant

## 2014-12-04 ENCOUNTER — Other Ambulatory Visit: Payer: Federal, State, Local not specified - PPO

## 2014-12-04 VITALS — BP 124/70 | HR 84 | Ht 70.0 in | Wt 185.4 lb

## 2014-12-04 DIAGNOSIS — R7989 Other specified abnormal findings of blood chemistry: Secondary | ICD-10-CM | POA: Diagnosis not present

## 2014-12-04 DIAGNOSIS — K760 Fatty (change of) liver, not elsewhere classified: Secondary | ICD-10-CM | POA: Diagnosis not present

## 2014-12-04 DIAGNOSIS — R9389 Abnormal findings on diagnostic imaging of other specified body structures: Secondary | ICD-10-CM

## 2014-12-04 DIAGNOSIS — R938 Abnormal findings on diagnostic imaging of other specified body structures: Secondary | ICD-10-CM

## 2014-12-04 DIAGNOSIS — R945 Abnormal results of liver function studies: Principal | ICD-10-CM

## 2014-12-04 NOTE — Patient Instructions (Signed)
Please go to the basement level to have your labs drawn.   We made you a follow up appointment with Dr. Owens Loffler for 02-24-2015 at 1:45 PM.

## 2014-12-04 NOTE — Progress Notes (Signed)
Patient ID: Paul Flowers, male   DOB: December 23, 1970, 44 y.o.   MRN: [id number]   Subjective:    Patient ID: Paul Flowers, male    DOB: August 31, 1970, 44 y.o.   MRN: [id number]  Paul Flowers is  A pleasant 44 year old African-American male who was initially seen about 3 weeks ago in referral after an episode of severe epigastric pain which sent him to the emergency room. This was associated with elevated LFTs , with bilirubin of 2 alkaline phosphatase 83 AST of 240 and MALT of 108. He had undergone upper abdominal ultrasound which questioned a small stone in the gallbladder and a somewhat contracted gallbladder there was no ductal dilation. CT was done showing a fatty liver, partially contracted gallbladder and at the level of the ampulla there was a slight polypoid projection into the duodenum , could not rule out mass.  With the patient was seen in the office had discussed had not had any reflux current episodes of pain and his LFTs had normalized. He was scheduled for MRI/MRCP which was completed on 11/19/2014. This showed geographic hepatic steatosis , and no biliary ductal dilation the distalmost common bile duct is indistinct favoring secondary to small caliber rather than a filling defect small for age in Along the gallbladder and to left renal cysts.  Patient also had repeat labs done last week LFTs remain normal.  He states he has not been having any abdominal discomfort and certainly no further episodes of severe pain. He says he feels fine.   further discussion about his fatty liver , he does have history of regular EtOH use though he says he does not drink every day at this point..  Review of Systems Pertinent positive and negative review of systems were noted in the above HPI section.  All other review of systems was otherwise negative.  Outpatient Encounter Prescriptions as of 12/04/2014  Medication Sig  . Aspirin-Acetaminophen-Caffeine (GOODY HEADACHE PO) Take 1 packet by mouth daily as needed  (for pain).  Marland Kitchen lansoprazole (PREVACID) 30 MG capsule Take 1 capsule (30 mg total) by mouth daily as needed (for indigestion). (Patient taking differently: Take 30 mg by mouth daily. )  . losartan (COZAAR) 25 MG tablet Take 1 tablet (25 mg total) by mouth daily.  . mometasone (NASONEX) 50 MCG/ACT nasal spray Place 2 sprays into the nose daily. (Patient taking differently: Place 2 sprays into the nose daily as needed. Seasonal allergies)  . montelukast (SINGULAIR) 10 MG tablet Take 1 tablet (10 mg total) by mouth at bedtime.   No facility-administered encounter medications on file as of 12/04/2014.   No Known Allergies Patient Active Problem List   Diagnosis Date Noted  . 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 Abdominal pain, epigastric 12/03/2014  . Toe pain, right 12/03/2014  . Hyperglycemia 09/24/2014  . HTN (hypertension) 11/09/2013  . Routine health maintenance 02/12/2013  . OSA (obstructive sleep apnea) 03/19/2011  . Allergic rhinitis 01/19/2007  . GERD 01/19/2007   History   Social History  . Marital Status: Divorced    Spouse Name: N/A  . Number of Children: 2  . Years of Education: N/A   Occupational History  . supervisor [employer]   Social History Main Topics  . Smoking status: Never Smoker   . Smokeless tobacco: Never Used  . Alcohol Use: 0.0 oz/week    0 Standard drinks or equivalent per week     Comment: occ  . Drug Use: No  .  Sexual Activity: Not on file   Other Topics Concern  . Not on file   Social History Narrative   Marital Status: Divorced '96, remarried '98   Children: son , dtr    Occupation: superviser   Hobbies: bowls 2 x a week/ floor exercise   Never smoked    Alcohol- 2 drinks per day          [name] family history includes Colonic polyp in his father; Diabetes in his maternal grandmother; Heart disease in his maternal grandmother; Heart disease (age of onset: 3) in his father; Hyperlipidemia in his father; Hypertension in  his mother; Stroke in his maternal grandfather. There is no history of Colon cancer.      Objective:    Filed Vitals:   12/04/14 1338  BP: 124/70  Pulse: 84    Physical Exam   Well-developed African-American male in no acute distress, pleasant blood pressure 124/70 pulse 84 height 5 foot 10 weight 185 GERD HEENT; nontraumatic normocephalic EOMI PERRLA sclera anicteric, Supple; no JVD, Cardiovascular; regular rate and rhythm with S1-S2 no murmur or gallop, Pulmonary; clear bilaterally , Abdomen ;soft nontender, nondistended, bowel sounds are active there is no palpable mass or hepatosplenomegaly bowel sounds are present , Ext; no clubbing cyanosis or edema skin warm and dry, Psych ;mood and affect appropriate       Assessment & Plan:   #1 44 yo AA male with  One episode of severe abdominal pain associated with bump in LFT's , and MRCP showing normal caliber CBD, slightly indistinct at most distal aspect Still wonder if may have passed a small stone or sludge. No ampullary lesion seen on MRI  #2 hepatic steatosis-suspect ETOH related #3 HTN  #4 OSA #5 GERD  Plan;   Dicussion regarding fatty liver- advised decreased ETOH intake- no more than one drink per day, less preferred. Will check hep C ab Discussion regarding findings on MRI/MRCP- will observe for  now -pt advised to call for ant recurrence of pain. Will have him follow up with DR. Ardis Hughs in 2- 3 months to further discuss fatty liver, and reassess regarding possible EUS/ERCP.    Phiona Ramnauth S Chrystian Cupples PA-C 12/04/2014   Cc: Jinny Sanders, MD

## 2014-12-05 LAB — HEPATITIS B SURFACE ANTIGEN: HEP B S AG: NEGATIVE

## 2014-12-05 LAB — HEPATITIS C ANTIBODY: HCV Ab: NEGATIVE

## 2014-12-05 LAB — HEPATITIS B SURFACE ANTIBODY,QUALITATIVE: HEP B S AB: NEGATIVE

## 2014-12-05 NOTE — Progress Notes (Signed)
I agree with the above note, plan 

## 2014-12-10 ENCOUNTER — Ambulatory Visit (INDEPENDENT_AMBULATORY_CARE_PROVIDER_SITE_OTHER)
Admission: RE | Admit: 2014-12-10 | Discharge: 2014-12-10 | Disposition: A | Discharge status: Home / Self Care | Payer: Federal, State, Local not specified - PPO | Source: Ambulatory Visit | Attending: Family Medicine | Admitting: Family Medicine

## 2014-12-10 ENCOUNTER — Encounter: Payer: Self-pay | Admitting: Family Medicine

## 2014-12-10 ENCOUNTER — Ambulatory Visit (INDEPENDENT_AMBULATORY_CARE_PROVIDER_SITE_OTHER): Payer: Federal, State, Local not specified - PPO | Admitting: Family Medicine

## 2014-12-10 VITALS — BP 130/82 | HR 89 | Temp 98.6°F | Ht 70.0 in | Wt 187.5 lb

## 2014-12-10 DIAGNOSIS — M79671 Pain in right foot: Secondary | ICD-10-CM

## 2014-12-10 DIAGNOSIS — M2580 Other specified joint disorders, unspecified joint: Secondary | ICD-10-CM | POA: Diagnosis not present

## 2014-12-10 NOTE — Patient Instructions (Signed)
Metatarsal pad or dancer's pad.  Hapad on the internet.

## 2014-12-10 NOTE — Progress Notes (Signed)
Dr. Frederico Hamman T. Xabi Wittler, MD, [facility] [place], [address] Phone: [phone] Fax: [phone]  12/10/2014  Patient: Paul Flowers, MRN: [id number], DOB: 1971-04-01, 44 y.o.  Primary Physician:  Eliezer Lofts, MD  Chief Complaint: Toe Pain  Subjective:   Paul Flowers is a 44 y.o. very pleasant male patient who presents with the following:  Consulting MD: Dr. Diona Browner  Patient presents with pain on the ball of his first metatarsal ongoing intermittently for at least 3 years. Previously he was managed at [facility], and they thought that he had sesamoiditis with possibly a sesamoid fracture per the patient. He was placed in a Cam Walker boot for 6-8 weeks. He also was given some orthotics, but he thinks that those orthotics are relatively uncomfortable, and they are rigid orthotic.  He used to be very active and competitive bowler, but now he is unable to do this. He is somewhat frustrated given the length of time.  [facility] podiatry - had toes on the balls of feet.   Sesamoiditis, ? Slight fracture. On the R and wore boot for two months.   Patient Active Problem List   Diagnosis Date Noted  . 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 Abdominal pain, epigastric 12/03/2014  . Toe pain, right 12/03/2014  . Hyperglycemia 09/24/2014  . HTN (hypertension) 11/09/2013  . Routine health maintenance 02/12/2013  . OSA (obstructive sleep apnea) 03/19/2011  . Allergic rhinitis 01/19/2007  . GERD 01/19/2007    Past Medical History  Diagnosis Date  . Allergic rhinitis   . GERD (gastroesophageal reflux disease)   . ALLERGIC RHINITIS 01/19/2007  . GERD 01/19/2007  . Sleep apnea 01/29/2011  . Hypertension     Slight  . Diabetes     diet controlled    Past Surgical History  Procedure Laterality Date  . Left heart catheterization with coronary angiogram N/A 12/11/2013    Procedure: LEFT HEART CATHETERIZATION WITH  CORONARY ANGIOGRAM;  Surgeon: Josue Hector, MD;  Location: [facility] CATH LAB;  Service: Cardiovascular;  Laterality: N/A;    History   Social History  . Marital Status: Divorced    Spouse Name: N/A  . Number of Children: 2  . Years of Education: N/A   Occupational History  . supervisor [employer]   Social History Main Topics  . Smoking status: Never Smoker   . Smokeless tobacco: Never Used  . Alcohol Use: 0.0 oz/week    0 Standard drinks or equivalent per week     Comment: occ  . Drug Use: No  . Sexual Activity: Not on file   Other Topics Concern  . Not on file   Social History Narrative   Marital Status: Divorced '96, remarried '98   Children: son , dtr    Occupation: superviser   Hobbies: bowls 2 x a week/ floor exercise   Never smoked    Alcohol- 2 drinks per day          Family History  Problem Relation Age of Onset  . Diabetes Maternal Grandmother   . Heart disease Maternal Grandmother   . Stroke Maternal Grandfather   . Hypertension Mother   . Heart disease Father 70  . Hyperlipidemia Father   . Colonic polyp Father   . Colon cancer Neg Hx     No Known Allergies  Medication list reviewed and updated in full in [place].   Past Medical  History, Surgical History, Social History, Family History, Problem List, Medications, and Allergies have been reviewed and updated if relevant.  GEN: No fevers, chills. Nontoxic. Primarily MSK c/o today. MSK: Detailed in the HPI GI: tolerating PO intake without difficulty Neuro: No numbness, parasthesias, or tingling associated. Otherwise the pertinent positives of the ROS are noted above.   Objective:   BP 130/82 mmHg  Pulse 89  Temp(Src) 98.6 F (37 C) (Oral)  Ht 5\' 10"  (1.778 m)  Wt 187 lb 8 oz (85.049 kg)  BMI 26.90 kg/m2   GEN: WDWN, NAD, Non-toxic, Alert & Oriented x 3 HEENT: Atraumatic, Normocephalic.  Ears and Nose: No external deformity. EXTR: No clubbing/cyanosis/edema NEURO: Normal  gait.  PSYCH: Normally interactive. Conversant. Not depressed or anxious appearing.  Calm demeanor.   FEET: B Echymosis: no Edema: no ROM: full LE B Gait: heel toe, non-antalgic, right foot is outward turned somewhat with pronation during gait. MT pain: no Callus pattern: none Lateral Mall: NT Medial Mall: NT Talus: NT Navicular: NT Cuboid: NT Calcaneous: NT Metatarsals: 1st MT head TTP on plantar aspect 5th MT: NT Phalanges: NT Achilles: NT Plantar Fascia: NT Fat Pad: NT Peroneals: NT Post Tib: NT Great Toe: Nml motion Ant Drawer: neg ATFL: NT CFL: NT Deltoid: NT Other foot breakdown: none Long arch: mild collapse Transverse arch: moderate b collapse Hindfoot breakdown: minimal Sensation: intact   Radiology:   Assessment and Plan:   Right foot pain - Plan: DG Foot Complete Right  >25 minutes spent in face to face time with patient, >50% spent in counselling or coordination of care  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 I gave him some metatarsal pads to use and I showed him how to put him in his shoes with the lipstick. Also gave him a cushioned over-the-counter orthotic. I think that this will probably be more helpful than his rigid orthotics. First, recommended that he try doing a metatarsal pad. If this does not work, he can try to do a dancers pad from the same company.  If he continues to have issues, then I can custom craft a different pad using poron in the office. Reviewed footwear.   Follow-up: prn  New Prescriptions   No medications on file   Orders Placed This Encounter  Procedures  . DG Foot Complete Right    Signed,  Deamber Buckhalter T. Synethia Endicott, MD   Patient's Medications  New Prescriptions   No medications on file  Previous Medications   ASPIRIN-ACETAMINOPHEN-CAFFEINE (GOODY HEADACHE PO)    Take 1 packet by mouth daily as needed (for pain).   LANSOPRAZOLE (PREVACID) 30 MG CAPSULE    Take 1 capsule (30 mg total) by mouth daily  as needed (for indigestion).   LOSARTAN (COZAAR) 25 MG TABLET    Take 1 tablet (25 mg total) by mouth daily.   MOMETASONE (NASONEX) 50 MCG/ACT NASAL SPRAY    Place 2 sprays into the nose daily.   MONTELUKAST (SINGULAIR) 10 MG TABLET    Take 1 tablet (10 mg total) by mouth at bedtime.  Modified Medications   No medications on file  Discontinued Medications   No medications on file

## 2014-12-10 NOTE — Progress Notes (Signed)
Pre visit review using our clinic review tool, if applicable. No additional management support is needed unless otherwise documented below in the visit note. 

## 2014-12-20 ENCOUNTER — Telehealth: Payer: Self-pay | Admitting: Pulmonary Disease

## 2014-12-20 NOTE — Telephone Encounter (Signed)
Spoke with [facility], she does not know where the fax went. Asked Almyra Free to re-fax form to me.   Received form in triage.  CY offered to complete the forms. Forms were given to CY to complete. Will hold in triage until forms have been completed.

## 2014-12-20 NOTE — Telephone Encounter (Signed)
Form completed and faxed to [employer].  Put in scan folder. Nothing further needed.

## 2014-12-25 ENCOUNTER — Other Ambulatory Visit: Payer: Self-pay | Admitting: Family Medicine

## 2014-12-26 ENCOUNTER — Other Ambulatory Visit: Payer: Self-pay | Admitting: *Deleted

## 2014-12-26 MED ORDER — LANSOPRAZOLE 30 MG PO CPDR: DELAYED_RELEASE_CAPSULE | ORAL | Status: DC

## 2015-01-03 ENCOUNTER — Telehealth: Payer: Self-pay | Admitting: Pulmonary Disease

## 2015-01-03 NOTE — Telephone Encounter (Signed)
Spoke with a Research scientist (physical sciences) at [employer]. Form will be refaxed. Nothing further was needed.

## 2015-01-20 ENCOUNTER — Telehealth: Payer: Self-pay | Admitting: Pulmonary Disease

## 2015-01-20 NOTE — Telephone Encounter (Signed)
Re-printed copy of form and letter signed by Dr. Annamaria Boots Faxed to [phone] ATTN: Delana Meyer  Nothing further needed.

## 2015-02-24 ENCOUNTER — Telehealth: Payer: Self-pay | Admitting: Gastroenterology

## 2015-02-24 ENCOUNTER — Ambulatory Visit: Payer: Federal, State, Local not specified - PPO | Admitting: Gastroenterology

## 2015-02-24 NOTE — Telephone Encounter (Signed)
Noted  

## 2015-04-22 ENCOUNTER — Telehealth: Payer: Self-pay | Admitting: *Deleted

## 2015-04-22 NOTE — Telephone Encounter (Signed)
Patient dropped off a request for a PA on Lansoprazole. Advised patient to check with his insurance company and see if they will cover something else. Patient stated that he has not tried any other medication in the past.  Patient called back stating that he checked with [employer] and was advised that they will cover the medication, but his doctor has to do paperwork to get this approved. Patient stated that he will try something over the counter until this is taken care of. Patient stated that Juana Di­az advised him to have his doctor's office call [phone] for approval. Called and spoke to Vicente Males at pharmacy ([facility]) and she will fax paperwork over from them regarding the PA

## 2015-04-22 NOTE — Telephone Encounter (Addendum)
PA completed over the phone.  Approved effective 02/21/2015 through 04/2016.  Left message for Mr. Cerney that the PA for his Prevacid has been approved.

## 2015-04-25 ENCOUNTER — Ambulatory Visit: Payer: Federal, State, Local not specified - PPO | Admitting: Gastroenterology

## 2015-04-30 ENCOUNTER — Ambulatory Visit: Payer: BC Managed Care – PPO | Admitting: Pulmonary Disease

## 2015-05-27 ENCOUNTER — Telehealth: Payer: Self-pay | Admitting: Family Medicine

## 2015-05-27 DIAGNOSIS — R739 Hyperglycemia, unspecified: Secondary | ICD-10-CM

## 2015-05-27 DIAGNOSIS — I10 Essential (primary) hypertension: Secondary | ICD-10-CM

## 2015-05-27 DIAGNOSIS — Z125 Encounter for screening for malignant neoplasm of prostate: Secondary | ICD-10-CM

## 2015-05-27 NOTE — Telephone Encounter (Signed)
-----   Message from Marchia Bond sent at 05/23/2015  1:58 PM EST ----- Regarding: Cpx labs Wed 11/30, need orders. Thanks! :-) Please order  future cpx labs for pt's upcoming lab appt. Thanks Aniceto Boss

## 2015-06-03 ENCOUNTER — Ambulatory Visit: Payer: Federal, State, Local not specified - PPO | Admitting: Internal Medicine

## 2015-06-04 ENCOUNTER — Other Ambulatory Visit (INDEPENDENT_AMBULATORY_CARE_PROVIDER_SITE_OTHER): Payer: Federal, State, Local not specified - PPO

## 2015-06-04 DIAGNOSIS — R739 Hyperglycemia, unspecified: Secondary | ICD-10-CM | POA: Diagnosis not present

## 2015-06-04 DIAGNOSIS — Z125 Encounter for screening for malignant neoplasm of prostate: Secondary | ICD-10-CM

## 2015-06-04 DIAGNOSIS — I10 Essential (primary) hypertension: Secondary | ICD-10-CM | POA: Diagnosis not present

## 2015-06-04 LAB — COMPREHENSIVE METABOLIC PANEL
ALBUMIN: 4 g/dL (ref 3.5–5.2)
ALK PHOS: 59 U/L (ref 39–117)
ALT: 27 U/L (ref 0–53)
AST: 21 U/L (ref 0–37)
BILIRUBIN TOTAL: 1 mg/dL (ref 0.2–1.2)
BUN: 9 mg/dL (ref 6–23)
CALCIUM: 9.5 mg/dL (ref 8.4–10.5)
CHLORIDE: 102 meq/L (ref 96–112)
CO2: 31 mEq/L (ref 19–32)
CREATININE: 0.81 mg/dL (ref 0.40–1.50)
GFR: 131.77 mL/min (ref >60.00)
Glucose, Bld: 119 mg/dL — ABNORMAL HIGH (ref 70–99)
Potassium: 4.3 mEq/L (ref 3.5–5.1)
Sodium: 140 mEq/L (ref 135–145)
TOTAL PROTEIN: 7.1 g/dL (ref 6.0–8.3)

## 2015-06-04 LAB — LIPID PANEL
CHOLESTEROL: 219 mg/dL — AB (ref 0–200)
HDL: 50.5 mg/dL (ref >39.00)
LDL Cholesterol: 145 mg/dL — ABNORMAL HIGH (ref 0–99)
NonHDL: 168.25
Total CHOL/HDL Ratio: 4
Triglycerides: 118 mg/dL (ref 0.0–149.0)
VLDL: 23.6 mg/dL (ref 0.0–40.0)

## 2015-06-04 LAB — HEMOGLOBIN A1C: HEMOGLOBIN A1C: 6.1 % (ref 4.6–6.5)

## 2015-06-04 LAB — PSA: PSA: 0.3 ng/mL (ref 0.10–4.00)

## 2015-06-05 ENCOUNTER — Other Ambulatory Visit: Payer: Federal, State, Local not specified - PPO

## 2015-06-10 ENCOUNTER — Encounter: Payer: Federal, State, Local not specified - PPO | Admitting: Family Medicine

## 2015-06-11 ENCOUNTER — Ambulatory Visit (INDEPENDENT_AMBULATORY_CARE_PROVIDER_SITE_OTHER): Payer: Federal, State, Local not specified - PPO | Admitting: Family Medicine

## 2015-06-11 ENCOUNTER — Encounter: Payer: Self-pay | Admitting: Family Medicine

## 2015-06-11 VITALS — BP 114/70 | HR 89 | Temp 98.3°F | Ht 69.5 in | Wt 187.8 lb

## 2015-06-11 DIAGNOSIS — Z0000 Encounter for general adult medical examination without abnormal findings: Secondary | ICD-10-CM

## 2015-06-11 DIAGNOSIS — E1169 Type 2 diabetes mellitus with other specified complication: Secondary | ICD-10-CM | POA: Insufficient documentation

## 2015-06-11 DIAGNOSIS — R7303 Prediabetes: Secondary | ICD-10-CM | POA: Diagnosis not present

## 2015-06-11 DIAGNOSIS — E785 Hyperlipidemia, unspecified: Secondary | ICD-10-CM | POA: Insufficient documentation

## 2015-06-11 DIAGNOSIS — I10 Essential (primary) hypertension: Secondary | ICD-10-CM | POA: Diagnosis not present

## 2015-06-11 DIAGNOSIS — E7800 Pure hypercholesterolemia, unspecified: Secondary | ICD-10-CM | POA: Diagnosis not present

## 2015-06-11 NOTE — Progress Notes (Signed)
Subjective:    Patient ID: Paul Flowers, male    DOB: [birth year], 44 y.o.   MRN: [id number]  HPI  44 year old male presents for wellness visit.   Hypertension:  Well controlled on cozaar 25 mg   BP Readings from Last 3 Encounters:  06/11/15 114/70  12/10/14 130/82  12/04/14 124/70  Using medication without problems or lightheadedness:  None Chest pain with exertion:None Edema:None Short of breath:None Average home BPs: None Other issues:  Seen in ER for chest pain, epigastric pain. Cath 12/11/2013 with Dr. Johnsie Cancel nml  Followed up with GI  After elevated LFTs. and severe epigastric pain which sent him to the emergency room. This was associated with elevated LFTs , with bilirubin of 2 alkaline phosphatase 83 AST of 240, abdominal ultrasound which questioned a small stone in the gallbladder and a somewhat contracted gallbladder there was no ductal dilation. CT was done showing a fatty liver, partially contracted gallbladder and at the level of the ampulla there was a slight polypoid projection into the duodenum , could not rule out mass.   MRI/MRCP which was completed on 11/19/2014. This showed geographic hepatic steatosis , and no biliary ductal dilation the distal most common bile duct is indistinct favoring secondary to small caliber rather than a filling defect small for age in Along the gallbladder and to left renal cysts.  Patient also had repeat labs done last week LFTs remain normal.  No further pain.  Prediabetes CBG 119 fasting.   Lab Results  Component Value Date   HGBA1C 6.1 06/04/2015  Diet:  Eating on the run, fast food Exercise: None.   Elevated Cholesterol:  Poor control, on no med. Goal < 130. Lab Results  Component Value Date   CHOL 219* 06/04/2015   HDL 50.50 06/04/2015   LDLCALC 145* 06/04/2015   LDLDIRECT 149.3 02/12/2013   TRIG 118.0 06/04/2015   CHOLHDL 4 06/04/2015  Using medications without problems: None Muscle aches: None   Review of  Systems     Objective:   Physical Exam  Constitutional: He appears well-developed and well-nourished.  Non-toxic appearance. He does not appear ill. No distress.  HENT:  Head: Normocephalic and atraumatic.  Right Ear: Hearing, tympanic membrane, external ear and ear canal normal.  Left Ear: Hearing, tympanic membrane, external ear and ear canal normal.  Nose: Nose normal.  Mouth/Throat: Uvula is midline, oropharynx is clear and moist and mucous membranes are normal.  Eyes: Conjunctivae, EOM and lids are normal. Pupils are equal, round, and reactive to light. Lids are everted and swept, no foreign bodies found.  Neck: Trachea normal, normal range of motion and phonation normal. Neck supple. Carotid bruit is not present. No thyroid mass and no thyromegaly present.  Cardiovascular: Normal rate, regular rhythm, S1 normal, S2 normal, intact distal pulses and normal pulses.  Exam reveals no gallop.   No murmur heard. Pulmonary/Chest: Breath sounds normal. He has no wheezes. He has no rhonchi. He has no rales.  Abdominal: Soft. Normal appearance and bowel sounds are normal. There is no hepatosplenomegaly. There is no tenderness. There is no rebound, no guarding and no CVA tenderness. No hernia.  Genitourinary:   Not indicated given pt asymptomatic.  Lymphadenopathy:    He has no cervical adenopathy.  Neurological: He is alert. He has normal strength and normal reflexes. No cranial nerve deficit or sensory deficit. Gait normal.  Skin: Skin is warm, dry and intact. No rash noted.  Psychiatric: He has a normal  mood and affect. His speech is normal and behavior is normal. Judgment normal.          Assessment & Plan:  The patient's preventative maintenance and recommended screening tests for an annual wellness exam were reviewed in full today. Brought up to date unless services declined.  Counselled on the importance of diet, exercise, and its role in overall health and mortality. The  patient's FH and SH was reviewed, including their home life, tobacco status, and drug and alcohol status.   Lab Results  Component Value Date   PSA 0.30 06/04/2015   PSA 0.34 02/12/2013   PSA 0.49 02/27/2009   Vaccines: uptodate with  Td, flu  Nonsmoker  HIV: refused

## 2015-06-11 NOTE — Assessment & Plan Note (Addendum)
Given info on low chol diet. Recheck in 3 months.

## 2015-06-11 NOTE — Patient Instructions (Addendum)
Start  Regular exercise 3-5 days a week. Work on a low sugar, low carb, low fat, low chol diet. Look into DASH diet plan. Start red yeast rice 600 mg 2 cap twice a day.

## 2015-06-11 NOTE — Progress Notes (Signed)
Pre visit review using our clinic review tool, if applicable. No additional management support is needed unless otherwise documented below in the visit note. 

## 2015-06-11 NOTE — Assessment & Plan Note (Signed)
Well controlled. Continue current medication. Encouraged exercise, weight loss, healthy eating habits.  

## 2015-06-11 NOTE — Assessment & Plan Note (Signed)
Counseled pt on healthy low carb diet. And increasing exercise. Recheck in 3 months.

## 2015-07-06 LAB — HM DIABETES EYE EXAM

## 2015-09-02 ENCOUNTER — Other Ambulatory Visit (INDEPENDENT_AMBULATORY_CARE_PROVIDER_SITE_OTHER): Payer: Federal, State, Local not specified - PPO

## 2015-09-02 ENCOUNTER — Telehealth: Payer: Self-pay | Admitting: Family Medicine

## 2015-09-02 DIAGNOSIS — E7800 Pure hypercholesterolemia, unspecified: Secondary | ICD-10-CM

## 2015-09-02 DIAGNOSIS — R7303 Prediabetes: Secondary | ICD-10-CM

## 2015-09-02 LAB — COMPREHENSIVE METABOLIC PANEL
ALK PHOS: 59 U/L (ref 39–117)
ALT: 30 U/L (ref 0–53)
AST: 27 U/L (ref 0–37)
Albumin: 4.4 g/dL (ref 3.5–5.2)
BILIRUBIN TOTAL: 1.3 mg/dL — AB (ref 0.2–1.2)
BUN: 10 mg/dL (ref 6–23)
CO2: 30 mEq/L (ref 19–32)
CREATININE: 0.81 mg/dL (ref 0.40–1.50)
Calcium: 9.7 mg/dL (ref 8.4–10.5)
Chloride: 102 mEq/L (ref 96–112)
GFR: 131.62 mL/min (ref >60.00)
GLUCOSE: 123 mg/dL — AB (ref 70–99)
POTASSIUM: 4.1 meq/L (ref 3.5–5.1)
SODIUM: 140 meq/L (ref 135–145)
TOTAL PROTEIN: 7.7 g/dL (ref 6.0–8.3)

## 2015-09-02 LAB — LIPID PANEL
Cholesterol: 209 mg/dL — ABNORMAL HIGH (ref 0–200)
HDL: 60.9 mg/dL (ref >39.00)
LDL Cholesterol: 122 mg/dL — ABNORMAL HIGH (ref 0–99)
NonHDL: 147.62
Total CHOL/HDL Ratio: 3
Triglycerides: 128 mg/dL (ref 0.0–149.0)
VLDL: 25.6 mg/dL (ref 0.0–40.0)

## 2015-09-02 NOTE — Telephone Encounter (Signed)
-----   Message from Ellamae Sia sent at 08/27/2015  3:31 PM EST ----- Regarding: Lab orders for Tuesday,2.28.17 Lab orders for a 3 month follow up appt.

## 2015-09-09 ENCOUNTER — Encounter: Payer: Self-pay | Admitting: Family Medicine

## 2015-09-09 ENCOUNTER — Ambulatory Visit: Payer: Federal, State, Local not specified - PPO | Admitting: Family Medicine

## 2015-09-09 ENCOUNTER — Ambulatory Visit (INDEPENDENT_AMBULATORY_CARE_PROVIDER_SITE_OTHER): Payer: Federal, State, Local not specified - PPO | Admitting: Family Medicine

## 2015-09-09 VITALS — BP 110/70 | HR 104 | Temp 98.5°F | Ht 69.5 in | Wt 190.5 lb

## 2015-09-09 DIAGNOSIS — R7303 Prediabetes: Secondary | ICD-10-CM | POA: Diagnosis not present

## 2015-09-09 DIAGNOSIS — E7800 Pure hypercholesterolemia, unspecified: Secondary | ICD-10-CM | POA: Diagnosis not present

## 2015-09-09 DIAGNOSIS — I10 Essential (primary) hypertension: Secondary | ICD-10-CM

## 2015-09-09 NOTE — Assessment & Plan Note (Signed)
Well controlled. Continue current medication.  

## 2015-09-09 NOTE — Assessment & Plan Note (Signed)
Improved control with red yeast rice and lifestyle changes.

## 2015-09-09 NOTE — Assessment & Plan Note (Signed)
Stable control. Reviewed ways to lower CBGs.

## 2015-09-09 NOTE — Progress Notes (Signed)
   Subjective:    Patient ID: Paul Flowers, male    DOB: March 31, 1971, 45 y.o.   MRN: [id number]  HPI  45 year old male presents for follow up on high chol and prediabetes.  He reports some difficultly with his schedule and trying to exercise. Hard to eat health as well. Still eating on the run.     prediabetes:  CBG 123 Lab Results  Component Value Date   HGBA1C 6.1 06/04/2015   Body mass index is 27.74 kg/(m^2).  Wt Readings from Last 3 Encounters:  09/09/15 190 lb 8 oz (86.41 kg)  06/11/15 187 lb 12 oz (85.163 kg)  12/10/14 187 lb 8 oz (85.049 kg)      HTN well controlled. BP Readings from Last 3 Encounters:  09/09/15 110/70  06/11/15 114/70  12/10/14 130/82    Elevated Cholesterol:  With lifestyle change LDL has improved from 145 down to 122, HDL is higher, total chol lower, trigs remain nml.  Has been using red yeast rice one tab daily. Lab Results  Component Value Date   CHOL 209* 09/02/2015   HDL 60.90 09/02/2015   LDLCALC 122* 09/02/2015   LDLDIRECT 149.3 02/12/2013   TRIG 128.0 09/02/2015   CHOLHDL 3 09/02/2015    Using medications without problems: Muscle aches:  Diet compliance: Exercise: Other complaints:   Review of Systems  Constitutional: Negative for fatigue.  HENT: Negative for ear pain.   Eyes: Negative for pain.  Respiratory: Negative for shortness of breath.   Cardiovascular: Negative for chest pain.  Gastrointestinal: Negative for abdominal pain.       Objective:   Physical Exam  Constitutional: Vital signs are normal. He appears well-developed and well-nourished.  HENT:  Head: Normocephalic.  Right Ear: Hearing normal.  Left Ear: Hearing normal.  Nose: Nose normal.  Mouth/Throat: Oropharynx is clear and moist and mucous membranes are normal.  Neck: Trachea normal. Carotid bruit is not present. No thyroid mass and no thyromegaly present.  Cardiovascular: Normal rate, regular rhythm and normal pulses.  Exam reveals no gallop, no  distant heart sounds and no friction rub.   No murmur heard. No peripheral edema  Pulmonary/Chest: Effort normal and breath sounds normal. No respiratory distress.  Skin: Skin is warm, dry and intact. No rash noted.  Psychiatric: He has a normal mood and affect. His speech is normal and behavior is normal. Thought content normal.          Assessment & Plan:

## 2015-09-09 NOTE — Progress Notes (Signed)
Pre visit review using our clinic review tool, if applicable. No additional management support is needed unless otherwise documented below in the visit note. 

## 2015-09-09 NOTE — Patient Instructions (Addendum)
Increase red yeast rice to 1 tab twice daily.  Work on low fat, low chol diet diet as well as low carb diet.  Start exercise 3-5 times a week. EXERCISE lowers sugar too!

## 2015-09-12 ENCOUNTER — Ambulatory Visit: Payer: Federal, State, Local not specified - PPO | Admitting: Pulmonary Disease

## 2015-10-17 ENCOUNTER — Other Ambulatory Visit: Payer: Self-pay | Admitting: Cardiovascular Disease

## 2015-10-18 ENCOUNTER — Other Ambulatory Visit: Payer: Self-pay | Admitting: Family Medicine

## 2015-12-11 DIAGNOSIS — K080 Exfoliation of teeth due to systemic causes: Secondary | ICD-10-CM | POA: Diagnosis not present

## 2016-01-19 ENCOUNTER — Other Ambulatory Visit: Payer: Self-pay | Admitting: Family Medicine

## 2016-02-09 ENCOUNTER — Ambulatory Visit (INDEPENDENT_AMBULATORY_CARE_PROVIDER_SITE_OTHER): Payer: Federal, State, Local not specified - PPO | Admitting: Internal Medicine

## 2016-02-09 ENCOUNTER — Ambulatory Visit (INDEPENDENT_AMBULATORY_CARE_PROVIDER_SITE_OTHER)
Admission: RE | Admit: 2016-02-09 | Discharge: 2016-02-09 | Disposition: A | Discharge status: Home / Self Care | Payer: Federal, State, Local not specified - PPO | Source: Ambulatory Visit | Attending: Internal Medicine | Admitting: Internal Medicine

## 2016-02-09 ENCOUNTER — Encounter: Payer: Self-pay | Admitting: Internal Medicine

## 2016-02-09 VITALS — BP 120/86 | HR 84 | Temp 98.2°F | Wt 191.0 lb

## 2016-02-09 DIAGNOSIS — R0781 Pleurodynia: Secondary | ICD-10-CM | POA: Diagnosis not present

## 2016-02-09 DIAGNOSIS — M25511 Pain in right shoulder: Secondary | ICD-10-CM

## 2016-02-09 DIAGNOSIS — W1849XA Other slipping, tripping and stumbling without falling, initial encounter: Secondary | ICD-10-CM

## 2016-02-09 DIAGNOSIS — W010XXA Fall on same level from slipping, tripping and stumbling without subsequent striking against object, initial encounter: Secondary | ICD-10-CM

## 2016-02-09 DIAGNOSIS — S299XXA Unspecified injury of thorax, initial encounter: Secondary | ICD-10-CM | POA: Diagnosis not present

## 2016-02-09 MED ORDER — IBUPROFEN 800 MG PO TABS: 800.0000 mg | ORAL_TABLET | Freq: Three times a day (TID) | ORAL | 0 refills | Status: DC | PRN

## 2016-02-09 NOTE — Patient Instructions (Signed)

## 2016-02-09 NOTE — Progress Notes (Signed)
HPI  Pt presents to the clinic today with a complaint of pain in his right side and right shoulder since falling on his right side while playing volleyball 10 days ago.  He describes the pain in his right side as constant aching at a severity of 9/10, and reports he will have sharp pain with movement and breathing.  He rates the right shoulder pain at a severity of 3-4/10, and notes decreased ROM, but states the right shoulder symptoms have improved since onset.  He admits to shortness of breath with deep breaths, and denies numbness or tingling or weakness in the right arm.  He has tried Excedrin and Ibuprofen with relief but symptoms return.       Review of Systems  . Past Medical History:  Diagnosis Date  . Allergic rhinitis   . ALLERGIC RHINITIS 01/19/2007  . Diabetes ([place])    diet controlled  . GERD 01/19/2007  . GERD (gastroesophageal reflux disease)   . Hypertension    Slight  . Sleep apnea 01/29/2011   Past Surgical History:  Procedure Laterality Date  . LEFT HEART CATHETERIZATION WITH CORONARY ANGIOGRAM N/A 12/11/2013   Procedure: LEFT HEART CATHETERIZATION WITH CORONARY ANGIOGRAM;  Surgeon: Josue Hector, MD;  Location: [facility] CATH LAB;  Service: Cardiovascular;  Laterality: N/A;   Family History  Problem Relation Age of Onset  . Hypertension Mother   . Heart disease Father 8  . Hyperlipidemia Father   . Colonic polyp Father   . Diabetes Maternal Grandmother   . Heart disease Maternal Grandmother   . Stroke Maternal Grandfather   . Colon cancer Neg Hx    No Known Allergies Current Outpatient Prescriptions on File Prior to Visit  Medication Sig Dispense Refill  . lansoprazole (PREVACID) 30 MG capsule TAKE 1 CAPSULE (30 MG TOTAL) BY MOUTH DAILY AS NEEDED (FOR INDIGESTION). 30 capsule 5  . losartan (COZAAR) 25 MG tablet TAKE 1 TABLET (25 MG TOTAL) BY MOUTH DAILY. 90 tablet 1  . mometasone (NASONEX) 50 MCG/ACT nasal spray Place 2 sprays into the nose daily. (Patient taking  differently: Place 2 sprays into the nose daily as needed. Seasonal allergies) 17 g 11  . montelukast (SINGULAIR) 10 MG tablet TAKE 1 TABLET (10 MG TOTAL) BY MOUTH AT BEDTIME. 30 tablet 11   No current facility-administered medications on file prior to visit.     Pulm: Pt reports SOB with deep breaths. MSK: Pt reports pain in the right side along ribcage, right shoulder pain, and decreased ROM of right shoulder.  Denies weakness of right arm. Neuro: Denies numbness or tingling.     Objective:   Physical Exam  BP 120/86   Pulse 84   Temp 98.2 F (36.8 C) (Oral)   Wt 191 lb (86.6 kg)   SpO2 98%   BMI 27.80 kg/m   General: Well-appearing, in no acute distress. Skin: No rashes or bruises noted. Pulm: Clear to auscultation bilaterally.  No wheezes, rales, or rhonchi. MSK:  Tenderness to palpation over right lateral chest wall along ribcage.  Tenderness to palpation of right lateral shoulder.  Full AROM of left shoulder.  Full extension, internal rotation, and external rotation of right shoulder, decreased flexion and abduction of right shoulder, pain with all motions.  Strength 5/5 bilateral upper extremities. Neuro: Sensation to light touch intact throughout bilateral upper extremities.       Assessment & Plan:   Right side rib pain, right shoulder pain s/p fall:  eRx for Ibuprofen 800mg   q8hrs as needed for pain Discussed with patient that xray would not change management of symptoms, but patient insisted, so will obtain xray of right ribcage today Discussed that symptoms will likely last 4-6 weeks Call if symptoms do not resolve  Will follow up after xray, RTC as needed Webb Silversmith, NP

## 2016-02-09 NOTE — Progress Notes (Signed)
   Subjective:    Patient ID: Paul Flowers, male    DOB: March 09, 1971, 45 y.o.   MRN: [id number]  HPI  Pt presents to the clinic today with a complaint of pain in his right side and right shoulder since falling on his right side while playing volleyball 10 days ago.  He describes the pain in his right side as constant aching at a severity of 9/10, and reports he will have sharp pain with movement and breathing.  He rates the right shoulder pain at a severity of 3-4/10, and notes decreased ROM, but states the right shoulder symptoms have improved since onset.  He admits to shortness of breath with deep breaths, and denies numbness or tingling or weakness in the right arm.  He has tried Excedrin and Ibuprofen with relief but symptoms return.       Review of Systems  Pulm: Pt reports SOB with deep breaths. MSK: Pt reports pain in the right side along ribcage, right shoulder pain, and decreased ROM of right shoulder.  Denies weakness of right arm. Neuro: Denies numbness or tingling.     Objective:   Physical Exam  General: Well-appearing, in no acute distress. Skin: No rashes or bruises noted. Pulm: Clear to auscultation bilaterally.  No wheezes, rales, or rhonchi. MSK:  Tenderness to palpation over right lateral chest wall along ribcage.  Tenderness to palpation of right lateral shoulder.  Full AROM of left shoulder.  Full extension, internal rotation, and external rotation of right shoulder, decreased flexion and abduction of right shoulder, pain with all motions.  Strength 5/5 bilateral upper extremities. Neuro: Sensation to light touch intact throughout bilateral upper extremities.       Assessment & Plan:   Pain in ribcage on right side, Right shoulder pain:  eRx for Ibuprofen 800mg  q8hrs as needed for pain Discussed with patient that xray would not change management of symptoms, but patient insisted, so will obtain xray of right ribcage today Discussed that symptoms will likely last  4-6 weeks Call if symptoms do not resolve

## 2016-02-10 ENCOUNTER — Ambulatory Visit: Payer: Federal, State, Local not specified - PPO | Admitting: Family Medicine

## 2016-04-18 ENCOUNTER — Other Ambulatory Visit: Payer: Self-pay | Admitting: Cardiovascular Disease

## 2016-04-30 ENCOUNTER — Telehealth: Payer: Self-pay

## 2016-04-30 NOTE — Telephone Encounter (Signed)
Pt left note; pt has refills at [facility] for lansoprazole but pt said he cannot get a refill. I spoke with Judson Roch at [facility] [employer] pt does have refills; pt has been paying $10.00 copay for # 30. Ins guideline is ins will pay for # 90 in a 365 day period. Pt can purchase without ins for $30.00 for # 30. If call [phone] can get override quantity exception and ins will pay.

## 2016-04-30 NOTE — Telephone Encounter (Addendum)
PA for lansoprazole 30 mg completed on CoverMyMeds and approved from 03/01/2016 through 04/30/2017.  Mr. Hellyer & [facility] notified.

## 2016-06-08 ENCOUNTER — Telehealth: Payer: Self-pay | Admitting: Family Medicine

## 2016-06-08 ENCOUNTER — Other Ambulatory Visit (INDEPENDENT_AMBULATORY_CARE_PROVIDER_SITE_OTHER): Payer: Federal, State, Local not specified - PPO

## 2016-06-08 DIAGNOSIS — K219 Gastro-esophageal reflux disease without esophagitis: Secondary | ICD-10-CM

## 2016-06-08 DIAGNOSIS — Z125 Encounter for screening for malignant neoplasm of prostate: Secondary | ICD-10-CM | POA: Diagnosis not present

## 2016-06-08 DIAGNOSIS — R7303 Prediabetes: Secondary | ICD-10-CM

## 2016-06-08 LAB — LIPID PANEL
CHOL/HDL RATIO: 4
CHOLESTEROL: 219 mg/dL — AB (ref 0–200)
HDL: 53 mg/dL (ref >39.00)
LDL CALC: 141 mg/dL — AB (ref 0–99)
NonHDL: 166.27
TRIGLYCERIDES: 125 mg/dL (ref 0.0–149.0)
VLDL: 25 mg/dL (ref 0.0–40.0)

## 2016-06-08 LAB — COMPREHENSIVE METABOLIC PANEL
ALBUMIN: 4.1 g/dL (ref 3.5–5.2)
ALT: 28 U/L (ref 0–53)
AST: 22 U/L (ref 0–37)
Alkaline Phosphatase: 68 U/L (ref 39–117)
BUN: 8 mg/dL (ref 6–23)
CHLORIDE: 102 meq/L (ref 96–112)
CO2: 29 meq/L (ref 19–32)
Calcium: 9.6 mg/dL (ref 8.4–10.5)
Creatinine, Ser: 0.86 mg/dL (ref 0.40–1.50)
GFR: 122.43 mL/min (ref >60.00)
Glucose, Bld: 138 mg/dL — ABNORMAL HIGH (ref 70–99)
POTASSIUM: 4.2 meq/L (ref 3.5–5.1)
SODIUM: 141 meq/L (ref 135–145)
Total Bilirubin: 0.9 mg/dL (ref 0.2–1.2)
Total Protein: 7 g/dL (ref 6.0–8.3)

## 2016-06-08 LAB — HEMOGLOBIN A1C: HEMOGLOBIN A1C: 6.8 % — AB (ref 4.6–6.5)

## 2016-06-08 LAB — PSA: PSA: 0.37 ng/mL (ref 0.10–4.00)

## 2016-06-08 NOTE — Telephone Encounter (Signed)
-----   Message from Ellamae Sia sent at 06/02/2016 10:23 AM EST ----- Regarding: Lab orders for Tuesday, 12.5.17  AWV lab orders, please.

## 2016-06-15 ENCOUNTER — Ambulatory Visit (INDEPENDENT_AMBULATORY_CARE_PROVIDER_SITE_OTHER): Payer: Federal, State, Local not specified - PPO | Admitting: Family Medicine

## 2016-06-15 ENCOUNTER — Encounter: Payer: Self-pay | Admitting: Family Medicine

## 2016-06-15 VITALS — BP 120/84 | HR 85 | Temp 97.6°F | Ht 69.75 in | Wt 188.5 lb

## 2016-06-15 DIAGNOSIS — Z0000 Encounter for general adult medical examination without abnormal findings: Secondary | ICD-10-CM | POA: Diagnosis not present

## 2016-06-15 DIAGNOSIS — K080 Exfoliation of teeth due to systemic causes: Secondary | ICD-10-CM | POA: Diagnosis not present

## 2016-06-15 DIAGNOSIS — E119 Type 2 diabetes mellitus without complications: Secondary | ICD-10-CM | POA: Diagnosis not present

## 2016-06-15 DIAGNOSIS — I10 Essential (primary) hypertension: Secondary | ICD-10-CM

## 2016-06-15 DIAGNOSIS — K219 Gastro-esophageal reflux disease without esophagitis: Secondary | ICD-10-CM

## 2016-06-15 DIAGNOSIS — E7800 Pure hypercholesterolemia, unspecified: Secondary | ICD-10-CM

## 2016-06-15 DIAGNOSIS — K588 Other irritable bowel syndrome: Secondary | ICD-10-CM

## 2016-06-15 DIAGNOSIS — K589 Irritable bowel syndrome without diarrhea: Secondary | ICD-10-CM | POA: Insufficient documentation

## 2016-06-15 LAB — HM DIABETES FOOT EXAM

## 2016-06-15 MED ORDER — ATORVASTATIN CALCIUM 20 MG PO TABS: 20.0000 mg | ORAL_TABLET | Freq: Every day | ORAL | 11 refills | Status: DC

## 2016-06-15 MED ORDER — PANTOPRAZOLE SODIUM 40 MG PO TBEC: 40.0000 mg | DELAYED_RELEASE_TABLET | Freq: Every day | ORAL | 3 refills | Status: DC

## 2016-06-15 NOTE — Assessment & Plan Note (Signed)
Poor control on prevacid. Trigger avoidance and trial of pantoprazole.

## 2016-06-15 NOTE — Progress Notes (Signed)
Pre visit review using our clinic review tool, if applicable. No additional management support is needed unless otherwise documented below in the visit note. 

## 2016-06-15 NOTE — Assessment & Plan Note (Signed)
High risk.. Start atorvastatin 20 mg daily.

## 2016-06-15 NOTE — Assessment & Plan Note (Signed)
No red flags for other issues. Will try conservative treatment first.

## 2016-06-15 NOTE — Assessment & Plan Note (Addendum)
Diet controlled, but reviewed new diagnosis with pt in detail. Start diet changes, increase activity. Reviewed in detail.

## 2016-06-15 NOTE — Progress Notes (Signed)
Subjective:    Patient ID: Paul Flowers, male    DOB: 10-18-1970, 45 y.o.   MRN: [id number]  HPI  The patient is here for annual wellness exam and preventative care.     Hypertension:    BP Readings from Last 3 Encounters:  06/15/16 120/84  02/09/16 120/86  09/09/15 110/70  Using medication without problems or lightheadedness:  Chest pain with exertion: Edema: Short of breath: Average home BPs: Other issues:  Prediabetes:  Now in DM range, poor diet.. Eats a lot of carbs, fast food. Lab Results  Component Value Date   HGBA1C 6.8 (H) 06/08/2016  Feet problems: Blood Sugars averaging: eye exam within last year:  Elevated Cholesterol:  Pt is high risk for CVD now given new diagnosis of DM. Lab Results  Component Value Date   CHOL 219 (H) 06/08/2016   HDL 53.00 06/08/2016   LDLCALC 141 (H) 06/08/2016   LDLDIRECT 149.3 02/12/2013   TRIG 125.0 06/08/2016   CHOLHDL 4 06/08/2016  Using medications without problems: Muscle aches:  Diet compliance: Exercise: Other complaints:  GERD occurring despite prevacid..    Social History /Family History/Past Medical History reviewed and updated if needed.  Review of Systems  Constitutional: Negative for fatigue and fever.  HENT: Negative for ear pain.   Eyes: Negative for pain.  Respiratory: Negative for cough and shortness of breath.   Cardiovascular: Negative for chest pain, palpitations and leg swelling.  Gastrointestinal: Positive for abdominal pain. Negative for abdominal distention, blood in stool, constipation, diarrhea, nausea and vomiting.  Genitourinary: Negative for dysuria.       He feels he may have irritable bowel.. Some cramping.. No D/C, no blood in stool.  Musculoskeletal: Negative for arthralgias.  Neurological: Negative for syncope, light-headedness and headaches.  Psychiatric/Behavioral: Negative for dysphoric mood.       Objective:   Physical Exam  Constitutional: Vital signs are normal. He  appears well-developed and well-nourished.  HENT:  Head: Normocephalic.  Right Ear: Hearing normal.  Left Ear: Hearing normal.  Nose: Nose normal.  Mouth/Throat: Oropharynx is clear and moist and mucous membranes are normal.  Neck: Trachea normal. Carotid bruit is not present. No thyroid mass and no thyromegaly present.  Cardiovascular: Normal rate, regular rhythm and normal pulses.  Exam reveals no gallop, no distant heart sounds and no friction rub.   No murmur heard. No peripheral edema  Pulmonary/Chest: Effort normal and breath sounds normal. No respiratory distress.  Skin: Skin is warm, dry and intact. No rash noted.  Psychiatric: He has a normal mood and affect. His speech is normal and behavior is normal. Thought content normal.      Diabetic foot exam: Normal inspection No skin breakdown No calluses  Normal DP pulses Normal sensation to light touch and monofilament Nails normal    Assessment & Plan:  The patient's preventative maintenance and recommended screening tests for an annual wellness exam were reviewed in full today. Brought up to date unless services declined.  Counselled on the importance of diet, exercise, and its role in overall health and mortality. The patient's FH and SH was reviewed, including their home life, tobacco status, and drug and alcohol status.    Vaccines: Uptodate with td and flurefused Prostate Cancer Screen:  Started given AA age > 60. Lab Results  Component Value Date   PSA 0.37 06/08/2016   PSA 0.30 06/04/2015   PSA 0.34 02/12/2013        Colon cancer: none  HIV screen:  Refused Nonsmoker  Body mass index is 27.24 kg/m.

## 2016-06-15 NOTE — Patient Instructions (Addendum)
Can try a probiotic such as Align or Culturelle. Increase water, increase fiber, decrease greasy foods, decrease stress. Decrease carbs in diet.  Start atorvastatin 20 mg daily.  Increase exercise to 150 min weekly. Decrease sweet beverages, alcohol. Change prevacid to generic protonix daily for reflux.

## 2016-07-15 LAB — HM DIABETES EYE EXAM

## 2016-07-26 ENCOUNTER — Encounter (HOSPITAL_COMMUNITY): Payer: Self-pay | Admitting: Emergency Medicine

## 2016-07-26 ENCOUNTER — Emergency Department (HOSPITAL_COMMUNITY)
Admission: EM | Admit: 2016-07-26 | Discharge: 2016-07-26 | Disposition: A | Discharge status: Home / Self Care | Payer: Federal, State, Local not specified - PPO | Attending: Emergency Medicine | Admitting: Emergency Medicine

## 2016-07-26 ENCOUNTER — Encounter: Payer: Self-pay | Admitting: Family Medicine

## 2016-07-26 ENCOUNTER — Ambulatory Visit (INDEPENDENT_AMBULATORY_CARE_PROVIDER_SITE_OTHER): Payer: Federal, State, Local not specified - PPO | Admitting: Family Medicine

## 2016-07-26 VITALS — BP 154/92 | HR 96 | Temp 98.7°F | Wt 182.8 lb

## 2016-07-26 DIAGNOSIS — R350 Frequency of micturition: Secondary | ICD-10-CM | POA: Diagnosis not present

## 2016-07-26 DIAGNOSIS — E119 Type 2 diabetes mellitus without complications: Secondary | ICD-10-CM | POA: Diagnosis not present

## 2016-07-26 DIAGNOSIS — Z7984 Long term (current) use of oral hypoglycemic drugs: Secondary | ICD-10-CM | POA: Insufficient documentation

## 2016-07-26 DIAGNOSIS — R739 Hyperglycemia, unspecified: Secondary | ICD-10-CM

## 2016-07-26 DIAGNOSIS — Z79899 Other long term (current) drug therapy: Secondary | ICD-10-CM | POA: Insufficient documentation

## 2016-07-26 DIAGNOSIS — E1165 Type 2 diabetes mellitus with hyperglycemia: Secondary | ICD-10-CM | POA: Diagnosis not present

## 2016-07-26 DIAGNOSIS — I10 Essential (primary) hypertension: Secondary | ICD-10-CM | POA: Insufficient documentation

## 2016-07-26 LAB — CBC
HCT: 43.5 % (ref 39.0–52.0)
Hemoglobin: 15.7 g/dL (ref 13.0–17.0)
MCH: 29.6 pg (ref 26.0–34.0)
MCHC: 36.1 g/dL — ABNORMAL HIGH (ref 30.0–36.0)
MCV: 82.1 fL (ref 78.0–100.0)
Platelets: 328 10*3/uL (ref 150–400)
RBC: 5.3 MIL/uL (ref 4.22–5.81)
RDW: 12.1 % (ref 11.5–15.5)
WBC: 8 10*3/uL (ref 4.0–10.5)

## 2016-07-26 LAB — URINALYSIS, ROUTINE W REFLEX MICROSCOPIC
BILIRUBIN URINE: NEGATIVE
Bacteria, UA: NONE SEEN
HGB URINE DIPSTICK: NEGATIVE
KETONES UR: 5 mg/dL — AB
LEUKOCYTES UA: NEGATIVE
NITRITE: NEGATIVE
PROTEIN: NEGATIVE mg/dL
SQUAMOUS EPITHELIAL / LPF: NONE SEEN
Specific Gravity, Urine: 1.036 — ABNORMAL HIGH (ref 1.005–1.030)
pH: 6 (ref 5.0–8.0)

## 2016-07-26 LAB — POC URINALSYSI DIPSTICK (AUTOMATED)
BILIRUBIN UA: NEGATIVE
KETONES UA: NEGATIVE
Leukocytes, UA: NEGATIVE
Nitrite, UA: NEGATIVE
Protein, UA: NEGATIVE
RBC UA: NEGATIVE
SPEC GRAV UA: 1.01
Urobilinogen, UA: 0.2
pH, UA: 6

## 2016-07-26 LAB — BASIC METABOLIC PANEL
Anion gap: 13 (ref 5–15)
BUN: 16 mg/dL (ref 6–20)
CHLORIDE: 97 mmol/L — AB (ref 101–111)
CO2: 22 mmol/L (ref 22–32)
CREATININE: 0.91 mg/dL (ref 0.61–1.24)
Calcium: 9.9 mg/dL (ref 8.9–10.3)
Glucose, Bld: 620 mg/dL (ref 65–99)
POTASSIUM: 4.8 mmol/L (ref 3.5–5.1)
SODIUM: 132 mmol/L — AB (ref 135–145)

## 2016-07-26 LAB — GLUCOSE, POCT (MANUAL RESULT ENTRY)

## 2016-07-26 LAB — CBG MONITORING, ED
GLUCOSE-CAPILLARY: 404 mg/dL — AB (ref 65–99)
Glucose-Capillary: 595 mg/dL (ref 65–99)
Glucose-Capillary: 291 mg/dL — ABNORMAL HIGH (ref 65–99)

## 2016-07-26 MED ORDER — INSULIN ASPART PROT & ASPART (70-30 MIX) 100 UNIT/ML ~~LOC~~ SUSP
12.0000 [IU] | Freq: Once | SUBCUTANEOUS | Status: AC
  Administered 2016-07-26: 12 [IU] via SUBCUTANEOUS
  Filled 2016-07-26: qty 10

## 2016-07-26 MED ORDER — SODIUM CHLORIDE 0.9 % IV BOLUS (SEPSIS)
1000.0000 mL | Freq: Once | INTRAVENOUS | Status: AC
  Administered 2016-07-26: 1000 mL via INTRAVENOUS

## 2016-07-26 MED ORDER — METFORMIN HCL 500 MG PO TABS: 500.0000 mg | ORAL_TABLET | Freq: Two times a day (BID) | ORAL | 0 refills | Status: DC

## 2016-07-26 NOTE — Progress Notes (Signed)
Subjective:   Patient ID: Paul Flowers, male    DOB: 1971-06-15, 46 y.o.   MRN: [id number]  Paul Flowers is a pleasant 46 y.o. year old male pt of Dr. Diona Browner, new to me, who presents to clinic today with Urinary Frequency  on 07/26/2016  HPI:  DM- newly diagnosed in 06/2016 but did not start rxs as he had previously been diet controlled. Past couple of weeks, worsening over the past few days, increased thirst, urination, dry mouth, blurred vision.  Has never felt like this before.  Feels "out of it."  Dad was recently diagnosed with diabetes.  Lab Results  Component Value Date   HGBA1C 6.8 (H) 06/08/2016   Wt Readings from Last 3 Encounters:  07/26/16 182 lb 12 oz (82.9 kg)  06/15/16 188 lb 8 oz (85.5 kg)  02/09/16 191 lb (86.6 kg)     Current Outpatient Prescriptions on File Prior to Visit  Medication Sig Dispense Refill  . atorvastatin (LIPITOR) 20 MG tablet Take 1 tablet (20 mg total) by mouth daily. 30 tablet 11  . ibuprofen (ADVIL,MOTRIN) 800 MG tablet Take 1 tablet (800 mg total) by mouth every 8 (eight) hours as needed. 30 tablet 0  . losartan (COZAAR) 25 MG tablet TAKE 1 TABLET (25 MG TOTAL) BY MOUTH DAILY. 90 tablet 1  . mometasone (NASONEX) 50 MCG/ACT nasal spray Place 2 sprays into the nose daily. (Patient taking differently: Place 2 sprays into the nose daily as needed. Seasonal allergies) 17 g 11  . montelukast (SINGULAIR) 10 MG tablet TAKE 1 TABLET (10 MG TOTAL) BY MOUTH AT BEDTIME. 30 tablet 11  . pantoprazole (PROTONIX) 40 MG tablet Take 1 tablet (40 mg total) by mouth daily. 30 tablet 3   No current facility-administered medications on file prior to visit.     No Known Allergies  Past Medical History:  Diagnosis Date  . Allergic rhinitis   . ALLERGIC RHINITIS 01/19/2007  . Diabetes ([place])    diet controlled  . GERD 01/19/2007  . GERD (gastroesophageal reflux disease)   . Hypertension    Slight  . Sleep apnea 01/29/2011    Past Surgical History:    Procedure Laterality Date  . LEFT HEART CATHETERIZATION WITH CORONARY ANGIOGRAM N/A 12/11/2013   Procedure: LEFT HEART CATHETERIZATION WITH CORONARY ANGIOGRAM;  Surgeon: Josue Hector, MD;  Location: [facility] CATH LAB;  Service: Cardiovascular;  Laterality: N/A;    Family History  Problem Relation Age of Onset  . Hypertension Mother   . Heart disease Father 38  . Hyperlipidemia Father   . Colonic polyp Father   . Diabetes Maternal Grandmother   . Heart disease Maternal Grandmother   . Stroke Maternal Grandfather   . Colon cancer Neg Hx     Social History   Social History  . Marital status: Divorced    Spouse name: N/A  . Number of children: 2  . Years of education: N/A   Occupational History  . supervisor [employer]   Social History Main Topics  . Smoking status: Never Smoker  . Smokeless tobacco: Never Used  . Alcohol use 0.0 oz/week     Comment: occ  . Drug use: No  . Sexual activity: Not on file   Other Topics Concern  . Not on file   Social History Narrative   Marital Status: Divorced '96, remarried '98   Children: son , dtr    Occupation: superviser   Hobbies: bowls 2 x a week/  floor exercise   Never smoked    Alcohol- 2 drinks per day         The PMH, PSH, Social History, Family History, Medications, and allergies have been reviewed in [facility], and have been updated if relevant.  Review of Systems  Constitutional: Positive for unexpected weight change.  HENT: Negative.   Eyes: Positive for visual disturbance.  Respiratory: Negative.   Cardiovascular: Negative.   Gastrointestinal: Negative.   Endocrine: Positive for polydipsia and polyuria.  Genitourinary: Positive for frequency. Negative for difficulty urinating and dysuria.  Musculoskeletal: Negative.   Hematological: Negative.   All other systems reviewed and are negative.      Objective:    BP (!) 154/92   Pulse 96   Temp 98.7 F (37.1 C) (Oral)   Wt 182 lb 12 oz (82.9 kg)   SpO2 100%    BMI 26.41 kg/m   BP Readings from Last 3 Encounters:  07/26/16 (!) 154/92  06/15/16 120/84  02/09/16 120/86    Physical Exam  Constitutional: He is oriented to person, place, and time. He appears well-developed and well-nourished. No distress.  HENT:  Head: Normocephalic.  Eyes: Conjunctivae are normal.  Cardiovascular: Normal rate.   Pulmonary/Chest: Effort normal.  Neurological: He is alert and oriented to person, place, and time. No cranial nerve deficit.  Skin: Skin is warm and dry. He is not diaphoretic.  Psychiatric: He has a normal mood and affect. His behavior is normal. Judgment and thought content normal.  Nursing note and vitals reviewed.         Assessment & Plan:   Controlled type 2 diabetes mellitus without complication, without long-term current use of insulin ([place]) - Plan: Comprehensive metabolic panel  Frequent urination - Plan: Comprehensive metabolic panel, POCT Urinalysis Dipstick (Automated) No Follow-up on file.

## 2016-07-26 NOTE — Discharge Instructions (Signed)
Take the metformin, and see your primary doctor on Friday.  Please return to the ER if your symptoms worsen; you have increased pain, fevers, chills, inability to keep any medications down, confusion. Otherwise see the outpatient doctor as requested.

## 2016-07-26 NOTE — ED Notes (Signed)
620 GLU

## 2016-07-26 NOTE — ED Provider Notes (Addendum)
[facility] DEPT Provider Note   CSN: [id number] Arrival date & time: 07/26/16  1636     History   Chief Complaint Chief Complaint  Patient presents with  . Hyperglycemia    HPI Paul Flowers is a 46 y.o. male.  HPI Pt comes in with cc of elevated blood sugar from PCP. Pt is a new onset diabetes patient, and saw pcp because over the last 2-3 days he has been having polyuria, polydypsia. Pt was noted to have CBG too high to read, so pcp wanted to ensure pt was seen in the ER right away and to r.o DKA. Pt had a recent viral infection.   Past Medical History:  Diagnosis Date  . Allergic rhinitis   . ALLERGIC RHINITIS 01/19/2007  . Diabetes ([place])    diet controlled  . GERD 01/19/2007  . GERD (gastroesophageal reflux disease)   . Hypertension    Slight  . Sleep apnea 01/29/2011    Patient Active Problem List   Diagnosis Date Noted  . Frequent urination 07/26/2016  . Elevated blood pressure reading 07/26/2016  . IBS (irritable bowel syndrome) 06/15/2016  . High cholesterol 06/11/2015  . 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 Diabetes mellitus type 2, controlled, without complications ([place]) [id number]  . HTN (hypertension) 11/09/2013  . Routine health maintenance 02/12/2013  . OSA (obstructive sleep apnea) 03/19/2011  . Allergic rhinitis 01/19/2007  . GERD 01/19/2007    Past Surgical History:  Procedure Laterality Date  . LEFT HEART CATHETERIZATION WITH CORONARY ANGIOGRAM N/A 12/11/2013   Procedure: LEFT HEART CATHETERIZATION WITH CORONARY ANGIOGRAM;  Surgeon: Josue Hector, MD;  Location: [facility] CATH LAB;  Service: Cardiovascular;  Laterality: N/A;       Home Medications    Prior to Admission medications   Medication Sig Start Date End Date Taking? Authorizing Provider  atorvastatin (LIPITOR) 20 MG tablet Take 1 tablet (20 mg total) by mouth daily. 06/15/16  Yes Amy Cletis [place], MD  ibuprofen (ADVIL,MOTRIN) 200 MG tablet Take 400 mg by mouth every 6 (six)  hours as needed.   Yes Historical Provider, MD  losartan (COZAAR) 25 MG tablet TAKE 1 TABLET (25 MG TOTAL) BY MOUTH DAILY. 04/20/16  Yes Amy E Bedsole, MD  montelukast (SINGULAIR) 10 MG tablet TAKE 1 TABLET (10 MG TOTAL) BY MOUTH AT BEDTIME. 10/19/15  Yes Amy E Bedsole, MD  pantoprazole (PROTONIX) 40 MG tablet Take 1 tablet (40 mg total) by mouth daily. 06/15/16  Yes Amy Cletis [place], MD  ibuprofen (ADVIL,MOTRIN) 800 MG tablet Take 1 tablet (800 mg total) by mouth every 8 (eight) hours as needed. Patient not taking: Reported on 07/26/2016 02/09/16   Jearld Fenton, NP  metFORMIN (GLUCOPHAGE) 500 MG tablet Take 1 tablet (500 mg total) by mouth 2 (two) times daily with a meal. 07/26/16   Varney Biles, MD  mometasone (NASONEX) 50 MCG/ACT nasal spray Place 2 sprays into the nose daily. Patient not taking: Reported on 07/26/2016 10/10/14   Jinny Sanders, MD    Family History Family History  Problem Relation Age of Onset  . Hypertension Mother   . Heart disease Father 62  . Hyperlipidemia Father   . Colonic polyp Father   . Diabetes Maternal Grandmother   . Heart disease Maternal Grandmother   . Stroke Maternal Grandfather   . Colon cancer Neg Hx     Social History Social History  Substance Use Topics  . Smoking status: Never Smoker  . Smokeless tobacco: Never  Used  . Alcohol use 0.0 oz/week     Comment: occ     Allergies   Patient has no known allergies.   Review of Systems Review of Systems  ROS 10 Systems reviewed and are negative for acute change except as noted in the HPI.     Physical Exam Updated Vital Signs BP 125/93   Pulse 90   Temp 98.8 F (37.1 C) (Oral)   Resp 16   Ht 5\' 10"  (1.778 m)   Wt 182 lb (82.6 kg)   SpO2 95%   BMI 26.11 kg/m   Physical Exam  Constitutional: He is oriented to person, place, and time. He appears well-developed.  HENT:  Head: Normocephalic and atraumatic.  Dry mucosa  Eyes: Conjunctivae and EOM are normal. Pupils are equal, round,  and reactive to light.  Neck: Normal range of motion. Neck supple.  Cardiovascular: Normal rate and regular rhythm.   Pulmonary/Chest: Effort normal and breath sounds normal.  Abdominal: Soft. Bowel sounds are normal. He exhibits no distension. There is no tenderness. There is no guarding.  Neurological: He is alert and oriented to person, place, and time.  Skin: Skin is warm.  Nursing note and vitals reviewed.    ED Treatments / Results  Labs (all labs ordered are listed, but only abnormal results are displayed) Labs Reviewed  BASIC METABOLIC PANEL - Abnormal; Notable for the following:       Result Value   Sodium 132 (*)    Chloride 97 (*)    Glucose, Bld 620 (*)    All other components within normal limits  CBC - Abnormal; Notable for the following:    MCHC 36.1 (*)    All other components within normal limits  URINALYSIS, ROUTINE W REFLEX MICROSCOPIC - Abnormal; Notable for the following:    Color, Urine STRAW (*)    Specific Gravity, Urine 1.036 (*)    Glucose, UA >=500 (*)    Ketones, ur 5 (*)    All other components within normal limits  CBG MONITORING, ED - Abnormal; Notable for the following:    Glucose-Capillary 595 (*)    All other components within normal limits  CBG MONITORING, ED - Abnormal; Notable for the following:    Glucose-Capillary 404 (*)    All other components within normal limits  CBG MONITORING, ED    EKG  EKG Interpretation None       Radiology No results found.  Procedures Procedures (including critical care time)  CRITICAL CARE Performed by: Varney Biles   Total critical care time: 35 minutes  Critical care time was exclusive of separately billable procedures and treating other patients.  Critical care was necessary to treat or prevent imminent or life-threatening deterioration.  Critical care was time spent personally by me on the following activities: development of treatment plan with patient and/or surrogate as well as  nursing, discussions with consultants, evaluation of patient's response to treatment, examination of patient, obtaining history from patient or surrogate, ordering and performing treatments and interventions, ordering and review of laboratory studies, ordering and review of radiographic studies, pulse oximetry and re-evaluation of patient's condition.   Medications Ordered in ED Medications  sodium chloride 0.9 % bolus 1,000 mL (1,000 mLs Intravenous New Bag/Given 07/26/16 2102)  sodium chloride 0.9 % bolus 1,000 mL (0 mLs Intravenous Stopped 07/26/16 2044)  insulin aspart protamine- aspart (NOVOLOG MIX 70/30) injection 12 Units (12 Units Subcutaneous Given 07/26/16 2040)     Initial Impression / Assessment  and Plan / ED Course  I have reviewed the triage vital signs and the nursing notes.  Pertinent labs & imaging results that were available during my care of the patient were reviewed by me and considered in my medical decision making (see chart for details).    Pt comes in with cc of elevated blood sugar. Pt is not in DKA based on the fact there is no AG and the bicarb is normal. We will hydrate - pt oes appear fairly dry and he has high specific gravity. Insulin subcutaneous ordered.  PCP note mentioned that pt was going to be started on metformin and they will see pt again on Friday - so we will initiate metformin from the ER.  Final Clinical Impressions(s) / ED Diagnoses   Final diagnoses:  Hyperglycemia  New onset type 2 diabetes mellitus ([place])    New Prescriptions New Prescriptions   METFORMIN (GLUCOPHAGE) 500 MG TABLET    Take 1 tablet (500 mg total) by mouth 2 (two) times daily with a meal.     Varney Biles, MD 07/26/16 [id number]    Varney Biles, MD 07/26/16 2147

## 2016-07-26 NOTE — Assessment & Plan Note (Signed)
FSBS "too high to read," meaning it is above 450. Was planning on starting Metformin with close follow up with PCP but I am concerned about his fluid status/dehydration vs DKA. Will send to ER for fluids and treatment. The patient indicates understanding of these issues and agrees with the plan.

## 2016-07-26 NOTE — ED Triage Notes (Addendum)
Pt complaint of CBG reading high; dry mouth, weight loss, frequent urination, and fatigue. Pt states "have diabetes but never given anything to take for it."

## 2016-07-29 ENCOUNTER — Encounter: Payer: Self-pay | Admitting: Family Medicine

## 2016-07-29 ENCOUNTER — Ambulatory Visit (INDEPENDENT_AMBULATORY_CARE_PROVIDER_SITE_OTHER): Payer: Federal, State, Local not specified - PPO | Admitting: Family Medicine

## 2016-07-29 VITALS — BP 104/80 | HR 103 | Temp 97.9°F | Ht 69.75 in | Wt 185.0 lb

## 2016-07-29 DIAGNOSIS — E119 Type 2 diabetes mellitus without complications: Secondary | ICD-10-CM | POA: Diagnosis not present

## 2016-07-29 MED ORDER — METFORMIN HCL 1000 MG PO TABS: 1000.0000 mg | ORAL_TABLET | Freq: Two times a day (BID) | ORAL | 11 refills | Status: DC

## 2016-07-29 NOTE — Progress Notes (Signed)
   Subjective:    Patient ID: Paul Flowers, male    DOB: 1971/02/01, 46 y.o.   MRN: [id number]  HPI  46 year old male presents for follow up hospitalization for hyuperglycemia in poorlyu controlled DM. Seen in ER on 1/22 for polyuria, polydipsia CBG 620  glucose in urine. NO DKA. No anion gap and bicarb nml Pt given IVF, insulin to bring down sugar  Started on metformin 500 mg twice daily. Checking blood sugar  yesterday, prior to dinner 339 This AM 371 fasting.  No SE to metformin.. No diarrhea.  Lab Results  Component Value Date   HGBA1C 6.8 (H) 06/08/2016   Still feeling tired, weak. Frequent urination, dry mouth.  BP Readings from Last 3 Encounters:  07/29/16 104/80  07/26/16 128/90  07/26/16 (!) 154/92     Review of Systems  Constitutional: Positive for fatigue. Negative for fever.  HENT: Negative for ear pain.   Eyes: Negative for pain.  Respiratory: Negative for cough and shortness of breath.   Cardiovascular: Negative for chest pain, palpitations and leg swelling.       Objective:   Physical Exam  Constitutional: Vital signs are normal. He appears well-developed and well-nourished. He appears lethargic.  HENT:  Head: Normocephalic.  Right Ear: Hearing normal.  Left Ear: Hearing normal.  Nose: Nose normal.  Mouth/Throat: Oropharynx is clear and moist and mucous membranes are normal.  Neck: Trachea normal. Carotid bruit is not present. No thyroid mass and no thyromegaly present.  Cardiovascular: Normal rate, regular rhythm and normal pulses.  Exam reveals no gallop, no distant heart sounds and no friction rub.   No murmur heard. No peripheral edema  Pulmonary/Chest: Effort normal and breath sounds normal. No respiratory distress.  Neurological: He appears lethargic.  Skin: Skin is warm, dry and intact. No rash noted.  Psychiatric: He has a normal mood and affect. His speech is normal and behavior is normal. Thought content normal.            Assessment & Plan:

## 2016-07-29 NOTE — Progress Notes (Signed)
Pre visit review using our clinic review tool, if applicable. No additional management support is needed unless otherwise documented below in the visit note. 

## 2016-07-29 NOTE — Patient Instructions (Addendum)
Keep up with with water intake, low carbohydrate diet. Increase metformin to 2 tab twice daily until you get the 1000 mg tabs (then take 1 twice daily) Check blood sugar fasting at 2 PM goal < 120) and once later in the day 2 hour after a meal. (Goal < 180) Stop at front desk to set up referral to nutritionist. Follow up in 2 weeks , bring blood sugar measurements to that OV. If blood sugar remains > 300 in next few days. Call ASAP.

## 2016-07-29 NOTE — Assessment & Plan Note (Signed)
Spent 30 min counseling on diet and lifestyle changes. Refer to nutritionist. Increase metformin and follow up with glucose record in 2 weeks.

## 2016-07-30 ENCOUNTER — Ambulatory Visit: Payer: Federal, State, Local not specified - PPO | Admitting: Family Medicine

## 2016-08-02 ENCOUNTER — Telehealth: Payer: Self-pay | Admitting: Family Medicine

## 2016-08-02 DIAGNOSIS — E119 Type 2 diabetes mellitus without complications: Secondary | ICD-10-CM

## 2016-08-02 NOTE — Telephone Encounter (Signed)
Pt would like to have referral to [facility]  Pt would prefer afternoon  cb number is [phone]

## 2016-08-03 NOTE — Telephone Encounter (Signed)
referral sent given pt request.

## 2016-08-12 ENCOUNTER — Ambulatory Visit: Payer: Federal, State, Local not specified - PPO | Admitting: Family Medicine

## 2016-08-23 ENCOUNTER — Other Ambulatory Visit: Payer: Self-pay

## 2016-08-23 ENCOUNTER — Ambulatory Visit (INDEPENDENT_AMBULATORY_CARE_PROVIDER_SITE_OTHER): Payer: Federal, State, Local not specified - PPO | Admitting: Endocrinology

## 2016-08-23 ENCOUNTER — Encounter: Payer: Self-pay | Admitting: Endocrinology

## 2016-08-23 VITALS — BP 128/80 | HR 74 | Ht 70.0 in | Wt 178.0 lb

## 2016-08-23 DIAGNOSIS — E1165 Type 2 diabetes mellitus with hyperglycemia: Secondary | ICD-10-CM | POA: Diagnosis not present

## 2016-08-23 MED ORDER — GLUCOSE BLOOD VI STRP: ORAL_STRIP | 5 refills | Status: DC

## 2016-08-23 MED ORDER — SITAGLIP PHOS-METFORMIN HCL ER 100-1000 MG PO TB24: ORAL_TABLET | ORAL | 3 refills | Status: DC

## 2016-08-23 MED ORDER — ONETOUCH LANCETS MISC: 2 refills | Status: AC

## 2016-08-23 NOTE — Patient Instructions (Addendum)
Check blood sugars on waking up  3x per wek  Also check blood sugars about 2 hours after a meal and do this after different meals by rotation  Recommended blood sugar levels on waking up is 90-130 and about 2 hours after meal is 130-160  Please bring your blood sugar monitor to each visit, thank you  Walk 30 min 3x weekly

## 2016-08-23 NOTE — Progress Notes (Signed)
Patient ID: Paul Flowers, male   DOB: 1971-06-16, 46 y.o.   MRN: [id number]            Reason for Appointment: Consultation for Type 2 Diabetes  Referring physician: Diona Browner   History of Present Illness:          Date of diagnosis of type 2 diabetes mellitus: 07/2016        Background history:   He had been told to have prediabetes about 2 years ago with highest A1c only 6.5 but no significant hyperglycemia He had not been following any particular diet or exercising and his A1c initially came down to 6.1 However in 12/17 his A1c was up to 6.8  Recent history:   Non-insulin hypoglycemic drugs the patient is taking are: Metformin 1 g twice a day  In the third week of January 2018 he started having fatigue, severely increased thirst, frequent urination and was found to have marked hyperglycemia with blood sugar of 620 without Acidosis , had small ketones in the urine He was hydrated in the emergency room and started on metformin 500 mg twice a day  Current management, blood sugar patterns and problems identified:  His blood sugars have been excellent in the last 2 weeks or so with nearly normal readings including after meals  He is not having any increased thirst or urination and his energy level is back to normal  He is trying to improve his diet with cutting back on carbohydrates and sweets. His PCP increased his metformin to  1000 mg twice a day after this was initially started but he is now experiencing diarrhea about 3 times a week which is fairly severe       Side effects from medications have been: diarrhea from metformin  Glucose monitoring:  done  times a day         Glucometer:  [employer] Blood Glucose readings by time of day and averages from Home record :  PREMEAL Breakfast Lunch Dinner Bedtime  Overall   Glucose range: 92-110  86-199    Median:        Self-care: The diet that the patient has been following is: tries to limit  Carbohydrates, sweets and drinks  with sugar.     Meal times are:  Breakfast is at  7 AM Lunch: at work during [employer]: 7 pm                Dietician visit, most recent: never               Exercise:   Is doing some walking at work, probably [phone] steps  Daily, no regular exercise program  Weight history:  Wt Readings from Last 3 Encounters:  08/23/16 178 lb (80.7 kg)  07/29/16 185 lb (83.9 kg)  07/26/16 182 lb (82.6 kg)    Glycemic control:   Lab Results  Component Value Date   HGBA1C 6.8 (H) 06/08/2016   HGBA1C 6.1 06/04/2015   HGBA1C 6.5 09/24/2014   Lab Results  Component Value Date   LDLCALC 141 (H) 06/08/2016   CREATININE 0.91 07/26/2016   No results found for: MICRALBCREAT     Allergies as of 08/23/2016   No Known Allergies     Medication List       Accurate as of 08/23/16  9:24 AM. Always use your most recent med list.          atorvastatin 20 MG tablet Commonly known as:  LIPITOR  Take 1 tablet (20 mg total) by mouth daily.   ibuprofen 200 MG tablet Commonly known as:  ADVIL,MOTRIN Take 400 mg by mouth every 6 (six) hours as needed.   losartan 25 MG tablet Commonly known as:  COZAAR TAKE 1 TABLET (25 MG TOTAL) BY MOUTH DAILY.   mometasone 50 MCG/ACT nasal spray Commonly known as:  NASONEX Place 2 sprays into the nose daily.   montelukast 10 MG tablet Commonly known as:  SINGULAIR TAKE 1 TABLET (10 MG TOTAL) BY MOUTH AT BEDTIME.   pantoprazole 40 MG tablet Commonly known as:  PROTONIX Take 1 tablet (40 mg total) by mouth daily.   SitaGLIPtin-MetFORMIN HCl [phone] MG Tb24 Commonly known as:  JANUMET XR 1 daily       Allergies: No Known Allergies  Past Medical History:  Diagnosis Date  . Allergic rhinitis   . ALLERGIC RHINITIS 01/19/2007  . Diabetes ([place])    diet controlled  . GERD 01/19/2007  . GERD (gastroesophageal reflux disease)   . Hypertension    Slight  . Sleep apnea 01/29/2011    Past Surgical History:  Procedure Laterality Date  . LEFT  HEART CATHETERIZATION WITH CORONARY ANGIOGRAM N/A 12/11/2013   Procedure: LEFT HEART CATHETERIZATION WITH CORONARY ANGIOGRAM;  Surgeon: Josue Hector, MD;  Location: [facility] CATH LAB;  Service: Cardiovascular;  Laterality: N/A;    Family History  Problem Relation Age of Onset  . Hypertension Mother   . Heart disease Father 54  . Hyperlipidemia Father   . Colonic polyp Father   . Diabetes Father   . Diabetes Maternal Grandmother   . Heart disease Maternal Grandmother   . Stroke Maternal Grandfather   . Colon cancer Neg Hx     Social History:  reports that he has never smoked. He has never used smokeless tobacco. He reports that he drinks alcohol. He reports that he does not use drugs.   Review of Systems  Constitutional: Positive for weight loss. Negative for reduced appetite.  Eyes:       Had mild blurred vision last month, currently finds his glasses not working appropriately with his vision  Respiratory: Negative for daytime sleepiness and shortness of breath.   Cardiovascular: Negative for chest pain and leg swelling.  Gastrointestinal: Negative for abdominal pain.  Endocrine: Negative for fatigue, polydipsia and light-headedness.  Genitourinary: Negative for frequency.       Has nocturia once  Musculoskeletal: Negative for joint pain.       Has intermittent pain in his feet, mostly after walking for some time, not recently  Neurological: Negative for weakness, numbness and tingling.     Lipid history: He has been recommended Lipitor 20 g daily for baseline LDL of 141 and long-standing history of hypercholesterolemia He has taken this but only irregularly    Lab Results  Component Value Date   CHOL 219 (H) 06/08/2016   HDL 53.00 06/08/2016   LDLCALC 141 (H) 06/08/2016   LDLDIRECT 149.3 02/12/2013   TRIG 125.0 06/08/2016   CHOLHDL 4 06/08/2016           Hypertension:2 Year history of mild hypertension treated with losartan  Most recent eye exam was In 1/18  Most  recent foot exam: 2/18    LABS:  No visits with results within 1 Week(s) from this visit.  Latest known visit with results is:  Admission on 07/26/2016, Discharged on 07/26/2016  Component Date Value Ref Range Status  . Sodium 07/26/2016 132* 135 - 145 mmol/L  Final  . Potassium 07/26/2016 4.8  3.5 - 5.1 mmol/L Final  . Chloride 07/26/2016 97* 101 - 111 mmol/L Final  . CO2 07/26/2016 22  22 - 32 mmol/L Final  . Glucose, Bld 07/26/2016 620* 65 - 99 mg/dL Final   Comment: CRITICAL RESULT CALLED TO, READ BACK BY AND VERIFIED WITH: M ROSSER AT 1818 ON 01.22.2018 BY NBROOKS   . BUN 07/26/2016 16  6 - 20 mg/dL Final  . Creatinine, Ser 07/26/2016 0.91  0.61 - 1.24 mg/dL Final  . Calcium 07/26/2016 9.9  8.9 - 10.3 mg/dL Final  . GFR calc non Af Amer 07/26/2016 >60  >60 mL/min Final  . GFR calc Af Amer 07/26/2016 >60  >60 mL/min Final   Comment: (NOTE) The eGFR has been calculated using the CKD EPI equation. This calculation has not been validated in all clinical situations. eGFR's persistently <60 mL/min signify possible Chronic Kidney Disease.   . Anion gap 07/26/2016 13  5 - 15 Final  . WBC 07/26/2016 8.0  4.0 - 10.5 K/uL Final  . RBC 07/26/2016 5.30  4.22 - 5.81 MIL/uL Final  . Hemoglobin 07/26/2016 15.7  13.0 - 17.0 g/dL Final  . HCT 07/26/2016 43.5  39.0 - 52.0 % Final  . MCV 07/26/2016 82.1  78.0 - 100.0 fL Final  . MCH 07/26/2016 29.6  26.0 - 34.0 pg Final  . MCHC 07/26/2016 36.1* 30.0 - 36.0 g/dL Final  . RDW 07/26/2016 12.1  11.5 - 15.5 % Final  . Platelets 07/26/2016 328  150 - 400 K/uL Final  . Color, Urine 07/26/2016 STRAW* YELLOW Final  . APPearance 07/26/2016 CLEAR  CLEAR Final  . Specific Gravity, Urine 07/26/2016 1.036* 1.005 - 1.030 Final  . pH 07/26/2016 6.0  5.0 - 8.0 Final  . Glucose, UA 07/26/2016 >=500* NEGATIVE mg/dL Final  . Hgb urine dipstick 07/26/2016 NEGATIVE  NEGATIVE Final  . Bilirubin Urine 07/26/2016 NEGATIVE  NEGATIVE Final  . Ketones, ur  07/26/2016 5* NEGATIVE mg/dL Final  . Protein, ur 07/26/2016 NEGATIVE  NEGATIVE mg/dL Final  . Nitrite 07/26/2016 NEGATIVE  NEGATIVE Final  . Leukocytes, UA 07/26/2016 NEGATIVE  NEGATIVE Final  . RBC / HPF 07/26/2016 0-5  0 - 5 RBC/hpf Final  . WBC, UA 07/26/2016 0-5  0 - 5 WBC/hpf Final  . Bacteria, UA 07/26/2016 NONE SEEN  NONE SEEN Final  . Squamous Epithelial / LPF 07/26/2016 NONE SEEN  NONE SEEN Final  . Glucose-Capillary 07/26/2016 595* 65 - 99 mg/dL Final  . Comment 1 07/26/2016 Notify RN   Final  . Glucose-Capillary 07/26/2016 404* 65 - 99 mg/dL Final  . Glucose-Capillary 07/26/2016 291* 65 - 99 mg/dL Final    Physical Examination:  BP 128/80   Pulse 74   Ht '5\' 10"'  (1.778 m)   Wt 178 lb (80.7 kg)   SpO2 97%   BMI 25.54 kg/m   GENERAL:      He is averagely built and nourished HEENT:         Eye exam shows normal external appearance. Fundus exam shows no retinopathy. Oral exam shows normal mucosa and not showing any dehydration .  NECK:   There is no lymphadenopathy Thyroid is not enlarged and no nodules felt.  Carotids are normal to palpation and no bruit heard LUNGS:         Chest is symmetrical. Lungs are clear to auscultation.Marland Kitchen   HEART:         Heart sounds:  S1 and S2 are  normal. No murmur or click heard., no S3 or S4.   ABDOMEN:   There is no distention present. Liver and spleen are not palpable. No other mass or tenderness present.   NEUROLOGICAL:   Ankle jerks are absent bilaterally, biceps reflexes normal .    Diabetic Foot Exam - Simple   Simple Foot Form Diabetic Foot exam was performed with the following findings:  Yes 08/23/2016  9:22 AM  Visual Inspection No deformities, no ulcerations, no other skin breakdown bilaterally:  Yes Sensation Testing Intact to touch and monofilament testing bilaterally:  Yes Pulse Check Posterior Tibialis and Dorsalis pulse intact bilaterally:  Yes Comments            Vibration sense is Normal in distal first  toes. MUSCULOSKELETAL:  There is no swelling or deformity of the peripheral joints. Spine is normal to inspection.   EXTREMITIES:     There is no edema. No skin lesions present.Marland Kitchen SKIN:       No rash or lesions of concern.        ASSESSMENT:  Diabetes type 2, recently diagnosed  Patient has a history of rather abrupt onset of severe hyperglycemia without ketoacidosis Also has had a background of prediabetes diagnosed in 2016 and A1c had gone up to 6.8 in 12/17 prior to his symptomatic hyperglycemia  His hyperglycemia has resolved very well with taking metformin and dietary changes Likely he has type II diabetes without obesity  Recently blood sugars are near normal when checked at home He is nonobese and not clear if he may have some insulin deficiency     Complications of diabetes: None detected  History of mild hypertension  History of hyperlipidemia, currently taking Lipitor although not regular with this   PLAN:     Since he is having diarrhea from metformin will need to reduce the dose to 1000 mg a day total  Also to help stabilize his diabetes long-term and improve post prandial control he will add Januvia.  Discussed how this works and that it usually is very safe and easy to take  For convenience he will start with Janumet XR 100/1000 daily  He will be given a One Touch Verio flex meter which would be easy to download and most likely more accurate than his [employer] brand  Prescription to check blood sugar once a day given, he will try to rotate the times of monitoring either fasting or after meals  He will schedule appointment with nutritionist for meal planning  Encouraged to start a walking program  Reassess his progress in about 6 weeks  Counseling time on subjects discussed above is over 50% of today's 60 minute visit   Patient Instructions  Check blood sugars on waking up  3x per wek  Also check blood sugars about 2 hours after a meal and do this after  different meals by rotation  Recommended blood sugar levels on waking up is 90-130 and about 2 hours after meal is 130-160  Please bring your blood sugar monitor to each visit, thank you  Walk 30 min 3x weekly       Consultation note has been sent to the referring physician  [facility] 08/23/2016, 9:24 AM   Note: This office note was prepared with Dragon voice recognition system technology. Any transcriptional errors that result from this process are unintentional.

## 2016-09-01 ENCOUNTER — Telehealth: Payer: Self-pay

## 2016-09-01 NOTE — Telephone Encounter (Signed)
Spoke with Harmon Pier at [facility].  She states we faxed a referral for Paul Flowers for diabetic classes.  She called Paul Flowers on 08/03/2016 to get him scheduled.  He stated he needed to check his calendar and would call her back.  He never called back and Harmon Pier states she has left many message for him.  She wanted to know if we needed to try and reach out to Paul Flowers or do we want to cancel the referral.  After further reviewing Paul Flowers chart,  it does look like Paul Flowers was seen by Dr. Dwyane Dee (endocrinologist) on 08/23/2016 and Dr. Dwyane Dee referred him to Nutrition and Diabetes at [facility].  I called Paul Flowers back to advise her of this and instructed her to cancel the referral at [facility]

## 2016-09-01 NOTE — Telephone Encounter (Signed)
Landon at [facility] request cb from [place] re: to referral to [place]; I offered to try and help and Harmon Pier said she needed to speak with Butch Penny.

## 2016-09-06 ENCOUNTER — Other Ambulatory Visit: Payer: Self-pay | Admitting: Family Medicine

## 2016-09-14 ENCOUNTER — Telehealth: Payer: Self-pay | Admitting: Family Medicine

## 2016-09-14 NOTE — Telephone Encounter (Signed)
-----   Message from Ellamae Sia sent at 09/01/2016  4:30 PM EST ----- Regarding: Lab orders for Wednesday, 3.14.18 Lab orders for f/u DM labs

## 2016-09-15 ENCOUNTER — Other Ambulatory Visit (INDEPENDENT_AMBULATORY_CARE_PROVIDER_SITE_OTHER): Payer: Federal, State, Local not specified - PPO

## 2016-09-15 DIAGNOSIS — E1165 Type 2 diabetes mellitus with hyperglycemia: Secondary | ICD-10-CM

## 2016-09-15 DIAGNOSIS — E119 Type 2 diabetes mellitus without complications: Secondary | ICD-10-CM

## 2016-09-15 LAB — LIPID PANEL
CHOL/HDL RATIO: 2
Cholesterol: 107 mg/dL (ref 0–200)
HDL: 43.3 mg/dL (ref >39.00)
LDL CALC: 53 mg/dL (ref 0–99)
NonHDL: 63.56
Triglycerides: 53 mg/dL (ref 0.0–149.0)
VLDL: 10.6 mg/dL (ref 0.0–40.0)

## 2016-09-15 LAB — HEMOGLOBIN A1C: Hgb A1c MFr Bld: 7.2 % — ABNORMAL HIGH (ref 4.6–6.5)

## 2016-09-15 LAB — COMPREHENSIVE METABOLIC PANEL
ALK PHOS: 52 U/L (ref 39–117)
ALT: 22 U/L (ref 0–53)
AST: 18 U/L (ref 0–37)
Albumin: 4.5 g/dL (ref 3.5–5.2)
BUN: 11 mg/dL (ref 6–23)
CHLORIDE: 102 meq/L (ref 96–112)
CO2: 29 meq/L (ref 19–32)
Calcium: 9.9 mg/dL (ref 8.4–10.5)
Creatinine, Ser: 0.73 mg/dL (ref 0.40–1.50)
GFR: 147.74 mL/min (ref >60.00)
GLUCOSE: 83 mg/dL (ref 70–99)
POTASSIUM: 4.2 meq/L (ref 3.5–5.1)
Sodium: 139 mEq/L (ref 135–145)
Total Bilirubin: 1.5 mg/dL — ABNORMAL HIGH (ref 0.2–1.2)
Total Protein: 7.4 g/dL (ref 6.0–8.3)

## 2016-09-16 LAB — FRUCTOSAMINE: Fructosamine: 277 umol/L (ref 0–285)

## 2016-09-17 ENCOUNTER — Ambulatory Visit: Payer: Federal, State, Local not specified - PPO | Admitting: Family Medicine

## 2016-09-21 ENCOUNTER — Ambulatory Visit: Payer: Federal, State, Local not specified - PPO | Admitting: Internal Medicine

## 2016-09-21 ENCOUNTER — Encounter: Payer: Self-pay | Admitting: Family Medicine

## 2016-09-21 ENCOUNTER — Ambulatory Visit (INDEPENDENT_AMBULATORY_CARE_PROVIDER_SITE_OTHER): Payer: Federal, State, Local not specified - PPO | Admitting: Family Medicine

## 2016-09-21 DIAGNOSIS — E119 Type 2 diabetes mellitus without complications: Secondary | ICD-10-CM | POA: Diagnosis not present

## 2016-09-21 DIAGNOSIS — E7800 Pure hypercholesterolemia, unspecified: Secondary | ICD-10-CM

## 2016-09-21 DIAGNOSIS — I10 Essential (primary) hypertension: Secondary | ICD-10-CM

## 2016-09-21 LAB — HM DIABETES FOOT EXAM

## 2016-09-21 NOTE — Assessment & Plan Note (Signed)
Improved control with new medication and lifestyle changes.

## 2016-09-21 NOTE — Assessment & Plan Note (Signed)
Improved control on losartan.. If BP decreasing with weight loss.. consider decreasing or stopping medication.

## 2016-09-21 NOTE — Progress Notes (Signed)
Pre visit review using our clinic review tool, if applicable. No additional management support is needed unless otherwise documented below in the visit note. 

## 2016-09-21 NOTE — Progress Notes (Signed)
   Subjective:    Patient ID: Paul Flowers, male    DOB: [birth year], 46 y.o.   MRN: [id number]  HPI    46 year old male presents for follow up DM.   Diabetes:  Improved control on sitagliptan/ metformin 100/1000 daily Now followed by Dr. Dwyane Dee ENDO.  FBS on 3/14 was 83. Lab Results  Component Value Date   HGBA1C 7.2 (H) 09/15/2016  Using medications without difficulties: Hypoglycemic episodes: none Hyperglycemic episodes: none Feet problems: none Blood Sugars averaging: FBS 80-102 eye exam within last year: yes  Hypertension:    Good control on losartan BP Readings from Last 3 Encounters:  09/21/16 104/62  08/23/16 128/80  07/29/16 104/80  Using medication without problems or lightheadedness:  none Chest pain with exertion:None Edema:None Short of breath: Average home BPs: not checking Other issues:  exercise: daily 30 min  Diet: improved Wt Readings from Last 3 Encounters:  09/21/16 173 lb 12 oz (78.8 kg)  08/23/16 178 lb (80.7 kg)  07/29/16 185 lb (83.9 kg)  Body mass index is 25.11 kg/m.   Elevated Cholesterol: Significant improvement on  atorvastatin 20 mg daily. Using medications without problems:None Muscle aches: None    Review of Systems  Constitutional: Negative for fatigue and fever.  HENT: Negative for ear pain.   Eyes: Negative for pain.  Respiratory: Negative for shortness of breath.   Cardiovascular: Negative for chest pain and leg swelling.  Gastrointestinal: Negative for abdominal distention.       Objective:   Physical Exam  Constitutional: Vital signs are normal. He appears well-developed and well-nourished.  HENT:  Head: Normocephalic.  Right Ear: Hearing normal.  Left Ear: Hearing normal.  Nose: Nose normal.  Mouth/Throat: Oropharynx is clear and moist and mucous membranes are normal.  Neck: Trachea normal. Carotid bruit is not present. No thyroid mass and no thyromegaly present.  Cardiovascular: Normal rate, regular rhythm  and normal pulses.  Exam reveals no gallop, no distant heart sounds and no friction rub.   No murmur heard. No peripheral edema  Pulmonary/Chest: Effort normal and breath sounds normal. No respiratory distress.  Skin: Skin is warm, dry and intact. No rash noted.  Psychiatric: He has a normal mood and affect. His speech is normal and behavior is normal. Thought content normal.   Diabetic foot exam: Normal inspection No skin breakdown No calluses  Normal DP pulses Normal sensation to light touch and monofilament Nails normal        Assessment & Plan:

## 2016-09-21 NOTE — Assessment & Plan Note (Signed)
Excellent improvement of atorvastatin. Continue.

## 2016-09-23 ENCOUNTER — Encounter: Payer: Self-pay | Admitting: Family Medicine

## 2016-10-03 NOTE — Progress Notes (Signed)
Patient ID: Paul Flowers, male   DOB: 1970-11-28, 46 y.o.   MRN: [id number]            Reason for Appointment: Follow-up abetes  Referring physician: Diona Browner   History of Present Illness:          Date of diagnosis of type 2 diabetes mellitus: 07/2016        Background history:   He had been told to have prediabetes about 2 years ago with highest A1c only 6.5 but no significant hyperglycemia He had not been following any particular diet or exercising and his A1c initially came down to 6.1 However in 12/17 his A1c was up to 6.8 In the third week of January 2018 he started having fatigue, severely increased thirst, frequent urination and was found to have marked hyperglycemia with blood sugar of 620 without Acidosis , had small ketones in the urine He was hydrated in the emergency room and started on metformin 500 mg twice a day  Recent history:   Non-insulin hypoglycemic drugs the patient is taking are: Janumet XR 100/1000 daily  He was switched from metformin to Janumet XR on his initial consultation in 08/2016 because of diarrhea with metformin  Most recent A1c 7.2% done in 3/18   Current management, blood sugar patterns and problems identified:  His blood sugars have been excellent since his last visit  He has had no difficulty tolerating the Janumet XR compared to metformin and is taking it with breakfast  He has able to maintain his weight down, is down about 3 pounds since his last visit  However has still not able to see the dietitian as yet, on his own he thinks he is doing well with controlling carbohydrates and fats  Has only one high normal reading of 140 after meals and is checking readings consistently at various times after meals also  He is trying to do some walking at least once a week but is generally trying to be active during the workday, sometimes working nights        Side effects from medications have been: diarrhea from metformin  Glucose  monitoring:  done 1-2 times a day         Glucometer:  One Touch Verio  Blood Glucose readings by time of day and averages from Home record :  Mean values apply above for all meters except median for One Touch  PRE-MEAL Fasting Lunch Dinner Bedtime Overall  Glucose range: 65-1 06    93-1 40    Mean/median: 90    105  90     Self-care: The diet that the patient has been following is: tries to limit  Carbohydrates, sweets and drinks with sugar.     Meal times are:  Breakfast is at  7 AM Lunch: at work during [employer]: 7 pm                Dietician visit, most recent: never               Exercise:   Is doing some walking at work, probably [phone] steps  Daily, no regular exercise program  Weight history:  Wt Readings from Last 3 Encounters:  10/04/16 175 lb (79.4 kg)  09/21/16 173 lb 12 oz (78.8 kg)  08/23/16 178 lb (80.7 kg)    Glycemic control:   Lab Results  Component Value Date   HGBA1C 7.2 (H) 09/15/2016   HGBA1C 6.8 (H) 06/08/2016   HGBA1C 6.1  06/04/2015   Lab Results  Component Value Date   LDLCALC 53 09/15/2016   CREATININE 0.73 09/15/2016   No results found for: [facility]  Lab Results  Component Value Date   FRUCTOSAMINE 277 09/15/2016     Allergies as of 10/04/2016   No Known Allergies     Medication List       Accurate as of 10/04/16  8:46 AM. Always use your most recent med list.          atorvastatin 20 MG tablet Commonly known as:  LIPITOR Take 1 tablet (20 mg total) by mouth daily.   glucose blood test strip Commonly known as:  ONETOUCH VERIO Use to test blood sugar once daily   ibuprofen 200 MG tablet Commonly known as:  ADVIL,MOTRIN Take 400 mg by mouth every 6 (six) hours as needed.   losartan 25 MG tablet Commonly known as:  COZAAR TAKE 1 TABLET (25 MG TOTAL) BY MOUTH DAILY.   mometasone 50 MCG/ACT nasal spray Commonly known as:  NASONEX PLACE 2 SPRAYS INTO THE NOSE DAILY.   montelukast 10 MG tablet Commonly known  as:  SINGULAIR TAKE 1 TABLET (10 MG TOTAL) BY MOUTH AT BEDTIME.   ONE TOUCH LANCETS Misc Use to test blood sugar once daily   pantoprazole 40 MG tablet Commonly known as:  PROTONIX Take 1 tablet (40 mg total) by mouth daily.   SitaGLIPtin-MetFORMIN HCl [phone] MG Tb24 Commonly known as:  JANUMET XR 1 daily       Allergies: No Known Allergies  Past Medical History:  Diagnosis Date  . Allergic rhinitis   . ALLERGIC RHINITIS 01/19/2007  . Diabetes ([place])    diet controlled  . GERD 01/19/2007  . GERD (gastroesophageal reflux disease)   . Hypertension    Slight  . Sleep apnea 01/29/2011    Past Surgical History:  Procedure Laterality Date  . LEFT HEART CATHETERIZATION WITH CORONARY ANGIOGRAM N/A 12/11/2013   Procedure: LEFT HEART CATHETERIZATION WITH CORONARY ANGIOGRAM;  Surgeon: Josue Hector, MD;  Location: [facility] CATH LAB;  Service: Cardiovascular;  Laterality: N/A;    Family History  Problem Relation Age of Onset  . Hypertension Mother   . Heart disease Father 71  . Hyperlipidemia Father   . Colonic polyp Father   . Diabetes Father   . Diabetes Maternal Grandmother   . Heart disease Maternal Grandmother   . Stroke Maternal Grandfather   . Colon cancer Neg Hx     Social History:  reports that he has never smoked. He has never used smokeless tobacco. He reports that he drinks alcohol. He reports that he does not use drugs.   Review of Systems   Lipid history: He has been Not taking Lipitor 20 mg daily for baseline LDL of 141 and long-standing history of hypercholesterolemia He has questions about safety long-term and the need for taking this, however does have at least a couple of risk factors ALT is normal   Lab Results  Component Value Date   CHOL 107 09/15/2016   HDL 43.30 09/15/2016   LDLCALC 53 09/15/2016   LDLDIRECT 149.3 02/12/2013   TRIG 53.0 09/15/2016   CHOLHDL 2 09/15/2016           Hypertension: 2 Year history of mild hypertension treated with  losartan  Most recent eye exam was In 1/18  Most recent foot exam: 2/18   Physical Examination:  BP 124/80   Pulse 73   Ht 5\' 10"  (1.778 m)  Wt 175 lb (79.4 kg)   BMI 25.11 kg/m       ASSESSMENT:  Diabetes type 2, Nonobese  See history of present illness for detailed discussion of current diabetes management, blood sugar patterns and problems identified  Patient has had excellent blood sugars which are practically in the normal range in the last month or so He is doing well with watching his diet, staying active and maintaining some weight loss Also has no side effects taking Janumet XR 100/1000 daily now  Most likely he has had recovery of his beta cell function; although he may not need medications this may help stabilize his diabetes long-term    History of mild hypertension, well-controlled  History of hyperlipidemia, currently taking Lipitor and LDL is excellent with taking this regularly    PLAN:    He will stay on the same regimen of Janumet XR.  He does not need check his sugars more than once a day at various times  He will keep the appointment with the dietitian  Follow-up in 3 months  Discussed blood sugar targets both fasting and postprandial     There are no Patient Instructions on file for this visit.    Donelle Baba 10/04/2016, 8:46 AM   Note: This office note was prepared with Dragon voice recognition system technology. Any transcriptional errors that result from this process are unintentional.

## 2016-10-04 ENCOUNTER — Encounter: Payer: Self-pay | Admitting: Endocrinology

## 2016-10-04 ENCOUNTER — Ambulatory Visit (INDEPENDENT_AMBULATORY_CARE_PROVIDER_SITE_OTHER): Payer: Federal, State, Local not specified - PPO | Admitting: Endocrinology

## 2016-10-04 VITALS — BP 124/80 | HR 73 | Ht 70.0 in | Wt 175.0 lb

## 2016-10-04 DIAGNOSIS — E119 Type 2 diabetes mellitus without complications: Secondary | ICD-10-CM | POA: Diagnosis not present

## 2016-10-08 ENCOUNTER — Ambulatory Visit: Payer: Federal, State, Local not specified - PPO | Admitting: Dietician

## 2016-10-14 ENCOUNTER — Other Ambulatory Visit: Payer: Self-pay | Admitting: Family Medicine

## 2016-10-19 ENCOUNTER — Other Ambulatory Visit: Payer: Self-pay | Admitting: Family Medicine

## 2016-11-01 ENCOUNTER — Encounter: Payer: Self-pay | Admitting: Dietician

## 2016-11-01 ENCOUNTER — Encounter: Payer: Federal, State, Local not specified - PPO | Attending: Endocrinology | Admitting: Dietician

## 2016-11-01 DIAGNOSIS — E1165 Type 2 diabetes mellitus with hyperglycemia: Secondary | ICD-10-CM | POA: Insufficient documentation

## 2016-11-01 DIAGNOSIS — Z713 Dietary counseling and surveillance: Secondary | ICD-10-CM | POA: Insufficient documentation

## 2016-11-01 DIAGNOSIS — E119 Type 2 diabetes mellitus without complications: Secondary | ICD-10-CM

## 2016-11-01 NOTE — Patient Instructions (Signed)
Keep an active lifestyle.  Aim for 30 minutes most days. Be mindful about the amount of added sugar Spread the carbohydrate throughout the day. Great job on the changes that you have made. Aim for 4 Carb Choices per meal (60 grams) +/- 1 either way  Aim for 0-2 Carbs per snack if hungry  Include protein in moderation with your meals and snacks Consider reading food labels for Total Carbohydrate and Fat Grams of foods Continue checking BG at alternate times per day as directed by MD  Continue taking medication as directed by MD

## 2016-11-01 NOTE — Progress Notes (Signed)
Diabetes Self-Management Education  Visit Type: First/Initial  Appt. Start Time: 0950 Appt. End Time: 1130  11/01/2016  [name], identified by name and date of birth, is a 46 y.o. male with a diagnosis of Diabetes: Type 2. Other hx includes GERD (which has improved with change to lower fat food choices), HTN, hyperlipidemia, and OSA (he no longer wears c-pap and no longer notices stopping breathing during sleep).  He has lost 18 lbs in the past 4 months as a result of dietary changes. He is taking Janumet and tolerates this well with noted improvement in his blood sugar.  Patient lives with her fiance.  She shops and cooks and they often eat out.  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  He is working days though July but usually works nights (11:30-8).  ASSESSMENT  Height 5' 9.75" (1.772 m), weight 170 lb (77.1 kg). Body mass index is 24.57 kg/m.      Diabetes Self-Management Education - 11/01/16 1009      Visit Information   Visit Type First/Initial     Initial Visit   Diabetes Type Type 2   Are you currently following a meal plan? Yes   What type of meal plan do you follow? avoiding things with sugar, white bread, reduced fat   Are you taking your medications as prescribed? Yes   Date Diagnosed 07/2016     Health Coping   How would you rate your overall health? Good     Psychosocial Assessment   Patient Belief/Attitude about Diabetes Motivated to manage diabetes   Self-care barriers None   Self-management support Doctor's office;Family;Friends   Other persons present Patient   Patient Concerns Nutrition/Meal planning   Special Needs None   Preferred Learning Style No preference indicated   Learning Readiness Ready   How often do you need to have someone help you when you read instructions, pamphlets, or other written materials from your doctor or pharmacy? 1 - Never   What is the last grade level you completed in school? some grad school     Pre-Education Assessment   Patient understands the diabetes disease and treatment process. Needs Instruction   Patient understands incorporating nutritional management into lifestyle. Needs Instruction   Patient undertands incorporating physical activity into lifestyle. Needs Instruction   Patient understands using medications safely. Needs Instruction   Patient understands monitoring blood glucose, interpreting and using results Needs Instruction   Patient understands prevention, detection, and treatment of acute complications. Needs Instruction   Patient understands prevention, detection, and treatment of chronic complications. Needs Instruction   Patient understands how to develop strategies to address psychosocial issues. Needs Instruction   Patient understands how to develop strategies to promote health/change behavior. Needs Instruction     Complications   Last HgB A1C per patient/outside source 7.2 %  3/18   How often do you check your blood sugar? 1-2 times/day   Fasting Blood glucose range (mg/dL) 70-129   Postprandial Blood glucose range (mg/dL) 70-129   Number of hypoglycemic episodes per month 0   Number of hyperglycemic episodes per week 0   Have you had a dilated eye exam in the past 12 months? Yes   Have you had a dental exam in the past 12 months? Yes   Are you checking your feet? Yes   How many days per week are you checking your feet? 1     Dietary Intake   Breakfast Vanilla Almond Special K with 1  pack sugar in the raw, 1% milk, 1/2 instant pack oatmeal with 1 pack sugar in the raw, coffee with 1 tsp sweetened creamer with 1 packet sugar in the raw OR 1 sausage burito from [employer]  5:15   Snack (morning) none   Lunch 6" subway on [employer]  12:30   Snack (afternoon) peanuts or popcorn or oreo thins   Radiographer, therapeutic OR sandwich OR bowl of cereal OR popcorn OR cereal and milk OR out to eat  6   Snack (evening) Coffee with cream or milk and sugar OR tuna with [employer]  crackers   Beverage(s) coffee with cream or milk and 1 packet sugar in the raw, herb tea (unsweetened), water, 1 coke zero per day, crystal light  stopped drinking coke and rum     Exercise   Exercise Type Light (walking / raking leaves)   How many days per week to you exercise? 1   How many minutes per day do you exercise? 30   Total minutes per week of exercise 30     Patient Education   Previous Diabetes Education No   Disease state  Definition of diabetes, type 1 and 2, and the diagnosis of diabetes   Nutrition management  Role of diet in the treatment of diabetes and the relationship between the three main macronutrients and blood glucose level;Food label reading, portion sizes and measuring food.;Carbohydrate counting;Meal options for control of blood glucose level and chronic complications.;Information on hints to eating out and maintain blood glucose control.;Reviewed blood glucose goals for pre and post meals and how to evaluate the patients' food intake on their blood glucose level.   Physical activity and exercise  Role of exercise on diabetes management, blood pressure control and cardiac health.   Medications Reviewed patients medication for diabetes, action, purpose, timing of dose and side effects.   Monitoring Purpose and frequency of SMBG.;Identified appropriate SMBG and/or A1C goals.;Daily foot exams;Yearly dilated eye exam   Chronic complications Relationship between chronic complications and blood glucose control;Assessed and discussed foot care and prevention of foot problems;Retinopathy and reason for yearly dilated eye exams   Psychosocial adjustment Worked with patient to identify barriers to care and solutions   Personal strategies to promote health Lifestyle issues that need to be addressed for better diabetes care     Individualized Goals (developed by patient)   Nutrition General guidelines for healthy choices and portions discussed;Follow meal plan discussed    Physical Activity Exercise 5-7 days per week;30 minutes per day   Medications take my medication as prescribed   Monitoring  test my blood glucose as discussed   Problem Solving changes when schedule changed to nights   Reducing Risk examine blood glucose patterns   Health Coping discuss diabetes with (comment)  MD/RD     Post-Education Assessment   Patient understands the diabetes disease and treatment process. Demonstrates understanding / competency   Patient understands incorporating nutritional management into lifestyle. Demonstrates understanding / competency   Patient undertands incorporating physical activity into lifestyle. Demonstrates understanding / competency   Patient understands using medications safely. Demonstrates understanding / competency   Patient understands monitoring blood glucose, interpreting and using results Needs Instruction   Patient understands prevention, detection, and treatment of acute complications. Demonstrates understanding / competency   Patient understands prevention, detection, and treatment of chronic complications. Demonstrates understanding / competency   Patient understands how to develop strategies to address psychosocial issues. Demonstrates understanding / competency   Patient understands how to develop  strategies to promote health/change behavior. Demonstrates understanding / competency     Outcomes   Expected Outcomes Demonstrated interest in learning. Expect positive outcomes   Future DMSE PRN   Program Status Completed      Individualized Plan for Diabetes Self-Management Training:   Learning Objective:  Patient will have a greater understanding of diabetes self-management. Patient education plan is to attend individual and/or group sessions per assessed needs and concerns.   Plan:   Patient Instructions  Keep an active lifestyle.  Aim for 30 minutes most days. Be mindful about the amount of added sugar Spread the carbohydrate  throughout the day. Great job on the changes that you have made. Aim for 4 Carb Choices per meal (60 grams) +/- 1 either way  Aim for 0-2 Carbs per snack if hungry  Include protein in moderation with your meals and snacks Consider reading food labels for Total Carbohydrate and Fat Grams of foods Continue checking BG at alternate times per day as directed by MD  Continue taking medication as directed by MD      Expected Outcomes:  Demonstrated interest in learning. Expect positive outcomes  Education material provided: Living Well with Diabetes, Food label handouts, A1C conversion sheet, Meal plan card, My Plate and Snack sheet, Diabetes Self-Management magazine.  If problems or questions, patient to contact team via:  Phone  Future DSME appointment: PRN

## 2016-12-16 DIAGNOSIS — K080 Exfoliation of teeth due to systemic causes: Secondary | ICD-10-CM | POA: Diagnosis not present

## 2016-12-17 ENCOUNTER — Other Ambulatory Visit: Payer: Self-pay | Admitting: Family Medicine

## 2016-12-17 ENCOUNTER — Telehealth: Payer: Self-pay | Admitting: Family Medicine

## 2016-12-17 NOTE — Telephone Encounter (Signed)
He does not need to do DM follow up here and at ENDO. Cancel labs and appt and have Dr. Dwyane Dee send labs there and OV not once completed in July.

## 2016-12-17 NOTE — Telephone Encounter (Signed)
Left message for Paul Flowers that he just needs to be seeing Dr. Dwyane Dee for his diabetes.  He is already schedule to see him in July.  Appointments with Dr. Diona Browner for next week cancelled.

## 2016-12-17 NOTE — Telephone Encounter (Signed)
-----   Message from Marchia Bond sent at 12/09/2016  2:32 PM EDT ----- Regarding: Dm f/u labs Wed 6/20, need orders. Thanks :-) Please order future dm f/u labs for pt's upcoming lab appt. He also has f/u dm lab orders for Dr Dwyane Dee and a f/u appt with him on 7/6. Thanks Aniceto Boss

## 2016-12-19 ENCOUNTER — Other Ambulatory Visit: Payer: Self-pay | Admitting: Endocrinology

## 2016-12-21 ENCOUNTER — Other Ambulatory Visit: Payer: Federal, State, Local not specified - PPO

## 2016-12-22 ENCOUNTER — Other Ambulatory Visit: Payer: Federal, State, Local not specified - PPO

## 2016-12-24 ENCOUNTER — Ambulatory Visit: Payer: Federal, State, Local not specified - PPO | Admitting: Family Medicine

## 2017-01-03 ENCOUNTER — Other Ambulatory Visit (INDEPENDENT_AMBULATORY_CARE_PROVIDER_SITE_OTHER): Payer: Federal, State, Local not specified - PPO

## 2017-01-03 DIAGNOSIS — E119 Type 2 diabetes mellitus without complications: Secondary | ICD-10-CM

## 2017-01-03 DIAGNOSIS — E1165 Type 2 diabetes mellitus with hyperglycemia: Secondary | ICD-10-CM | POA: Diagnosis not present

## 2017-01-03 LAB — MICROALBUMIN / CREATININE URINE RATIO
Creatinine,U: 186.3 mg/dL
Microalb Creat Ratio: 0.4 mg/g (ref 0.0–30.0)
Microalb, Ur: 0.8 mg/dL (ref 0.0–1.9)

## 2017-01-03 LAB — HEMOGLOBIN A1C: HEMOGLOBIN A1C: 5.9 % (ref 4.6–6.5)

## 2017-01-03 LAB — GLUCOSE, RANDOM: GLUCOSE: 101 mg/dL — AB (ref 70–99)

## 2017-01-07 ENCOUNTER — Ambulatory Visit (INDEPENDENT_AMBULATORY_CARE_PROVIDER_SITE_OTHER): Payer: Federal, State, Local not specified - PPO | Admitting: Endocrinology

## 2017-01-07 ENCOUNTER — Encounter: Payer: Self-pay | Admitting: Endocrinology

## 2017-01-07 VITALS — BP 130/82 | HR 84 | Ht 70.0 in | Wt 173.0 lb

## 2017-01-07 DIAGNOSIS — E119 Type 2 diabetes mellitus without complications: Secondary | ICD-10-CM | POA: Diagnosis not present

## 2017-01-07 NOTE — Progress Notes (Addendum)
Patient ID: Paul Flowers, male   DOB: 1971/03/19, 46 y.o.   MRN: [id number]            Reason for Appointment: Follow-up of diabetes  Referring physician: Diona Browner   History of Present Illness:          Date of diagnosis of type 2 diabetes mellitus: 12/17        Background history:   He had been told to have prediabetes about 2 years ago with highest A1c only 6.5 but no significant hyperglycemia He had not been following any particular diet or exercising and his A1c initially came down to 6.1 However in 12/17 his A1c was up to 6.8 In the third week of January 2018 he started having fatigue, severely increased thirst, frequent urination and was found to have marked hyperglycemia with blood sugar of 620 without Acidosis , had small ketones in the urine He was discharged on metformin  Recent history:   Non-insulin hypoglycemic drugs the patient is taking are: Janumet XR 100/1000 daily  He was switched from metformin to Janumet XR on his initial consultation in 08/2016 because of diarrhea with metformin  Most recent is 5.9, previously A1c 7.2% done in 3/18  Current management, blood sugar patterns and problems identified:  His blood sugars have been excellent although checking mostly in the evening  He does have animal increase in fasting blood sugars compared to his evening reading  He has gained 3 pounds since his last visit  He says that he has benefited from talking to the dietitian, generally watches his diet well  He is trying to do some walking but only about once a week but is generally trying to be active during the workday, sometimes working nights        Side effects from medications have been: diarrhea from metformin  Glucose monitoring:  done 1-2 times a day         Glucometer:  One Touch Verio  Blood Glucose readings by time of day and averages from Home monitor :  RANGE 79-110 with median 92, most readings in the evenings before and after supper   Self-care: The diet that the patient has been following is: tries to limit  Carbohydrates, sweets and drinks with sugar.     Meal times are:  Breakfast is at  7 AM Lunch: at work during [employer]: 7 pm                Dietician visit, most recent: 10/2016               Exercise:   Is doing walking at work   Weight history:  Wt Readings from Last 3 Encounters:  01/07/17 173 lb (78.5 kg)  11/01/16 170 lb (77.1 kg)  10/04/16 175 lb (79.4 kg)    Glycemic control:   Lab Results  Component Value Date   HGBA1C 5.9 01/03/2017   HGBA1C 7.2 (H) 09/15/2016   HGBA1C 6.8 (H) 06/08/2016   Lab Results  Component Value Date   MICROALBUR 0.8 01/03/2017   LDLCALC 53 09/15/2016   CREATININE 0.73 09/15/2016   Lab Results  Component Value Date   MICRALBCREAT 0.4 01/03/2017    Lab Results  Component Value Date   FRUCTOSAMINE 277 09/15/2016     Allergies as of 01/07/2017   No Known Allergies     Medication List       Accurate as of 01/07/17 10:54 AM. Always use your most recent med  list.          atorvastatin 20 MG tablet Commonly known as:  LIPITOR Take 1 tablet (20 mg total) by mouth daily.   glucose blood test strip Commonly known as:  ONETOUCH VERIO Use to test blood sugar once daily   ibuprofen 200 MG tablet Commonly known as:  ADVIL,MOTRIN Take 400 mg by mouth every 6 (six) hours as needed.   JANUMET XR [phone] MG Tb24 Generic drug:  SitaGLIPtin-MetFORMIN HCl TAKE 1 TABLET BY MOUTH DAILY   losartan 25 MG tablet Commonly known as:  COZAAR TAKE 1 TABLET (25 MG TOTAL) BY MOUTH DAILY.   mometasone 50 MCG/ACT nasal spray Commonly known as:  NASONEX PLACE 2 SPRAYS INTO THE NOSE DAILY.   montelukast 10 MG tablet Commonly known as:  SINGULAIR TAKE 1 TABLET (10 MG TOTAL) BY MOUTH AT BEDTIME.   multivitamin with minerals Tabs tablet Take 1 tablet by mouth daily.   ONE TOUCH LANCETS Misc Use to test blood sugar once daily   pantoprazole 40 MG  tablet Commonly known as:  PROTONIX TAKE 1 TABLET (40 MG TOTAL) BY MOUTH DAILY.       Allergies: No Known Allergies  Past Medical History:  Diagnosis Date  . Allergic rhinitis   . ALLERGIC RHINITIS 01/19/2007  . Diabetes ([place])    diet controlled  . GERD 01/19/2007  . GERD (gastroesophageal reflux disease)   . Hypertension    Slight  . Sleep apnea 01/29/2011    Past Surgical History:  Procedure Laterality Date  . LEFT HEART CATHETERIZATION WITH CORONARY ANGIOGRAM N/A 12/11/2013   Procedure: LEFT HEART CATHETERIZATION WITH CORONARY ANGIOGRAM;  Surgeon: Josue Hector, MD;  Location: [facility] CATH LAB;  Service: Cardiovascular;  Laterality: N/A;    Family History  Problem Relation Age of Onset  . Hypertension Mother   . Heart disease Father 5  . Hyperlipidemia Father   . Colonic polyp Father   . Diabetes Father   . Diabetes Maternal Grandmother   . Heart disease Maternal Grandmother   . Stroke Maternal Grandfather   . Colon cancer Neg Hx     Social History:  reports that he has never smoked. He has never used smokeless tobacco. He reports that he drinks alcohol. He reports that he does not use drugs.   Review of Systems   Lipid history: He has been  taking Lipitor 20 mg daily for baseline LDL of 141 and long-standing history of hypercholesterolemia   Lab Results  Component Value Date   CHOL 107 09/15/2016   HDL 43.30 09/15/2016   LDLCALC 53 09/15/2016   LDLDIRECT 149.3 02/12/2013   TRIG 53.0 09/15/2016   CHOLHDL 2 09/15/2016           Hypertension:  treated with Only 25 mg losartan, Followed by PCP  Most recent eye exam was In 1/18  Most recent foot exam: 2/18   Physical Examination:  BP 130/82   Pulse 84   Ht 5\' 10"  (1.778 m)   Wt 173 lb (78.5 kg)   SpO2 98%   BMI 24.82 kg/m       ASSESSMENT:  Diabetes type 2, Nonobese  See history of present illness for detailed discussion of current diabetes management, blood sugar patterns and problems  identified  Patient has had excellent blood sugars  A1c is now 5.9 He has high normal fasting readings but evening readings before and after meals are normal range He is doing well with watching his diet, staying active  He is compliant with Janumet XR 100/1000 daily now  History of mild hypertension, blood pressure is upper normal today Microalbumin normal   PLAN:    He will stay on the same regimen of Janumet XR. Although he may be able to manage with diet and exercise alone considering that he had severe hyperglycemia and generally will have him continue this for long-term benefits  He will do some readings in the mornings also  Follow-up in 3 months, will also recheck blood pressure at that time  Discussed blood sugar targets both fasting and postprandial     There are no Patient Instructions on file for this visit.    Paul Flowers 01/07/2017, 10:54 AM   Note: This office note was prepared with Dragon voice recognition system technology. Any transcriptional errors that result from this process are unintentional.

## 2017-01-12 ENCOUNTER — Encounter: Payer: Self-pay | Admitting: Endocrinology

## 2017-01-24 IMAGING — US US ABDOMEN LIMITED
1 series · 14 of 25 positions shown · non-contrast
Comparison: None.

CLINICAL DATA: Acute onset of right upper quadrant abdominal pain.
Initial encounter.

EXAM:
US ABDOMEN LIMITED - RIGHT UPPER QUADRANT

[Series 1: us abdomen limited · 0.25mm/px · 14 of 40 slices shown]
[im 1/40]
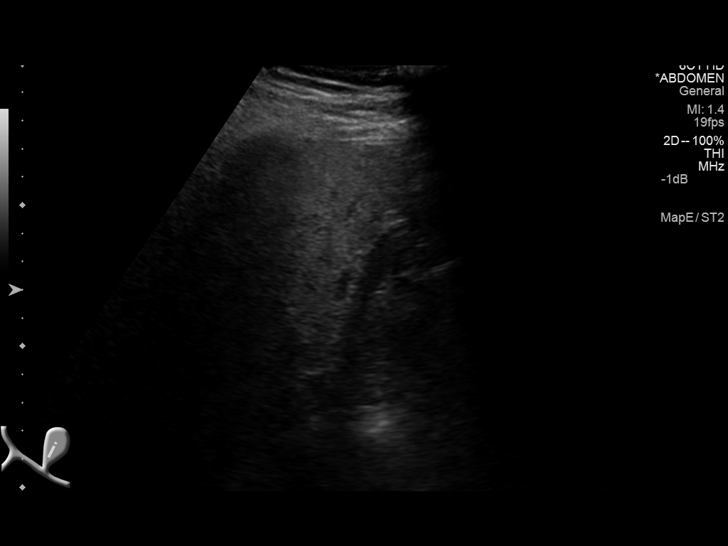
[im 4/40]
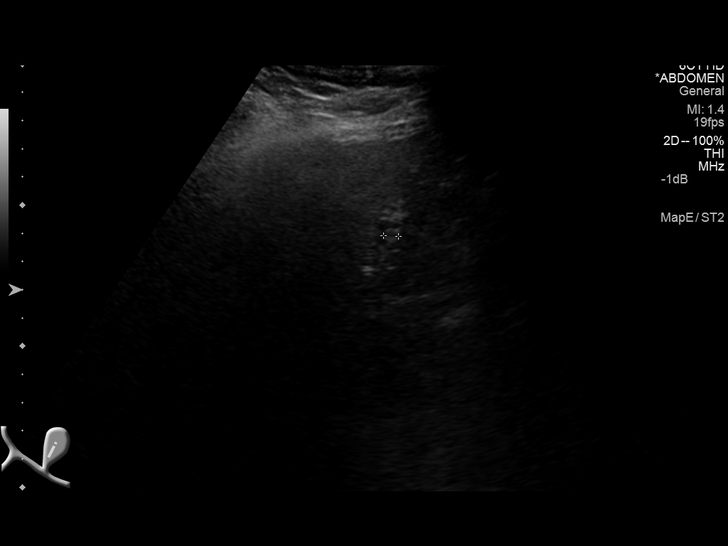
[im 7/40]
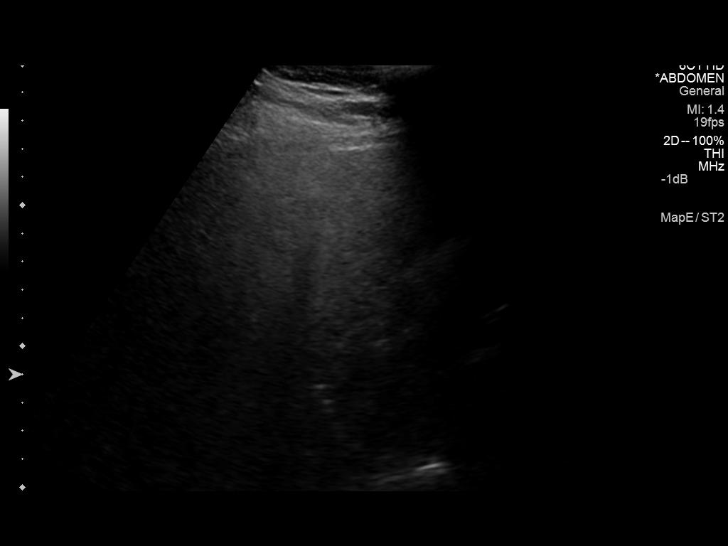
[im 10/40]
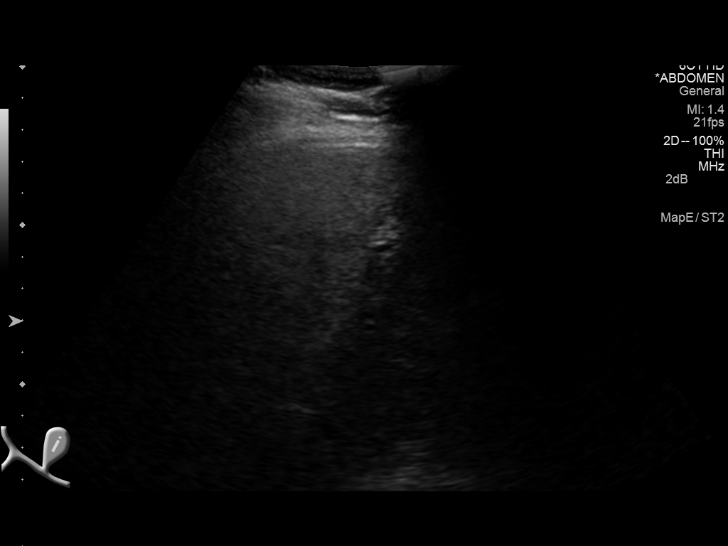
[im 14/40]
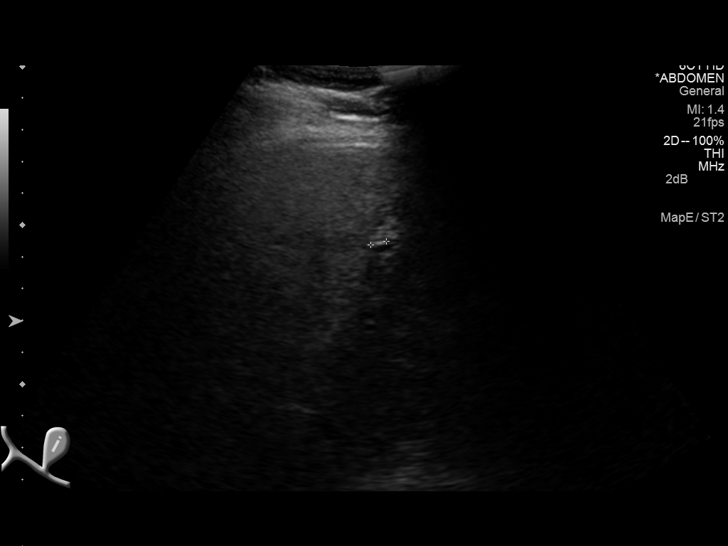
[im 15/40]
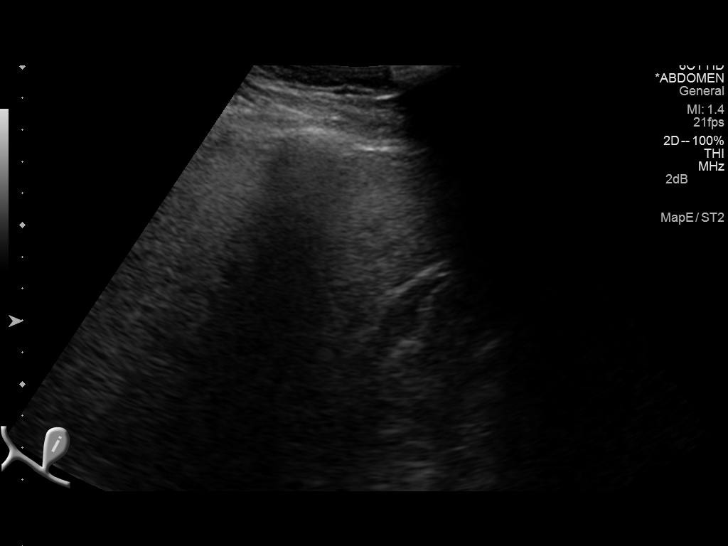
[im 18/40]
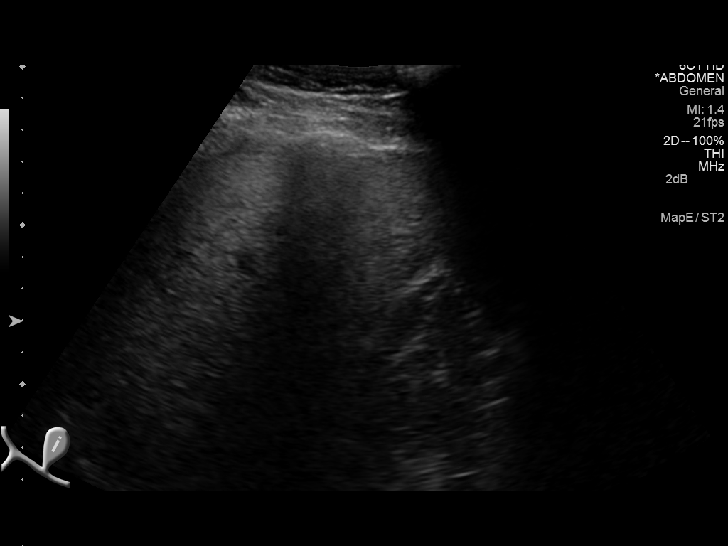
[im 22/40]
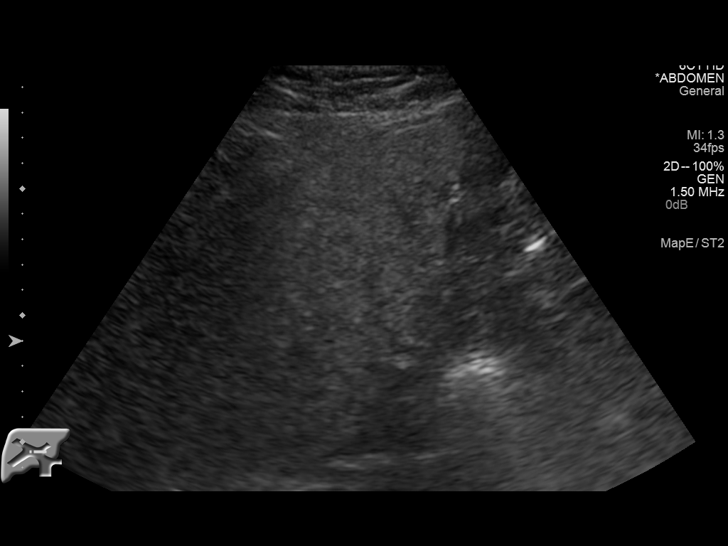
[im 25/40]
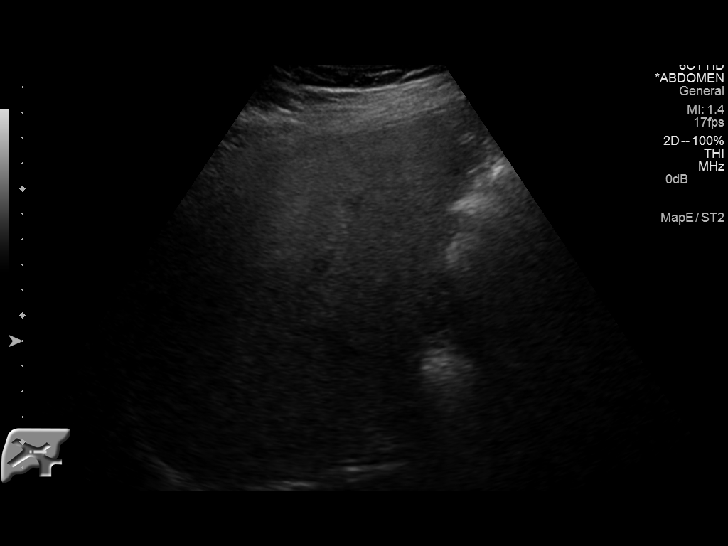
[im 27/40]
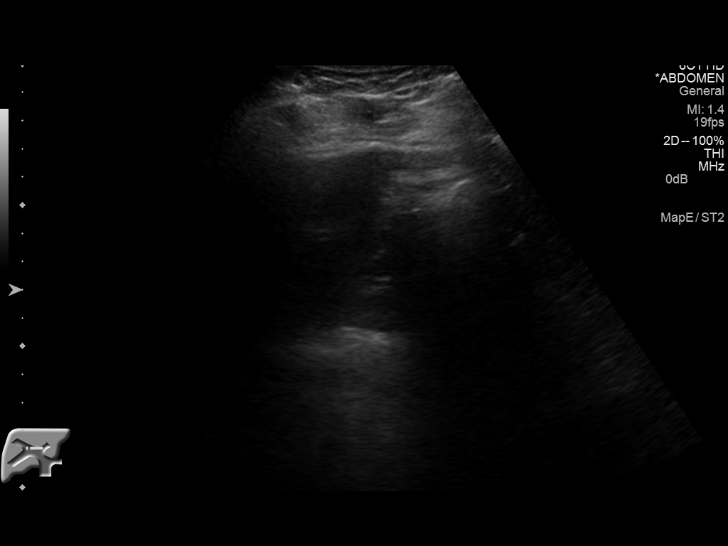
[im 30/40]
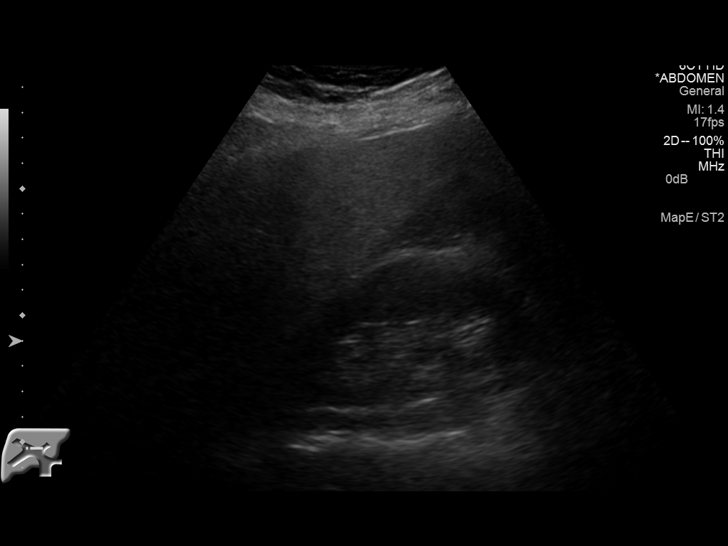
[im 33/40]
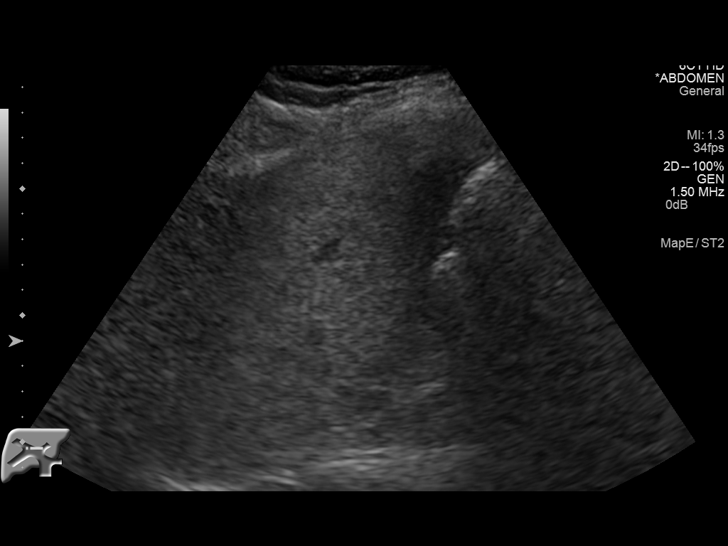
[im 36/40]
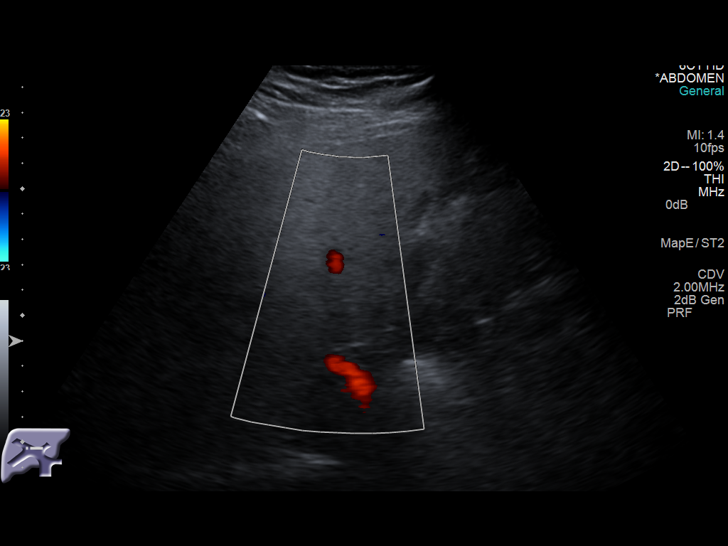
[im 40/40]
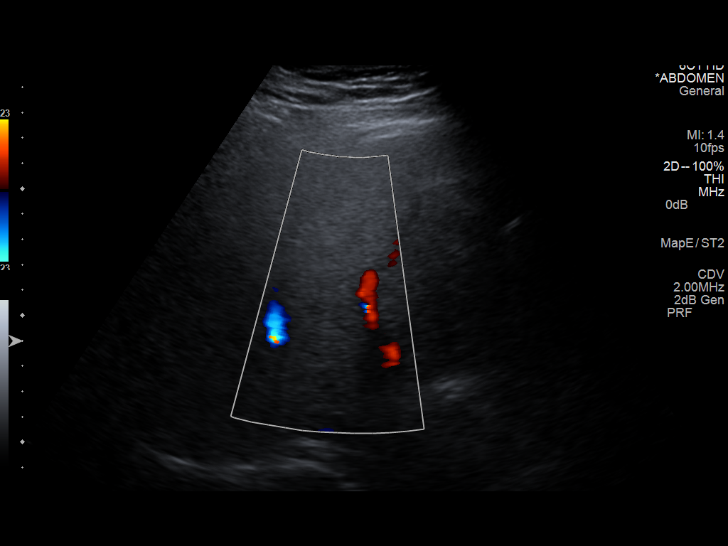

[14 of 25 positions shown; findings below may reference images not displayed]

FINDINGS: Gallbladder:

The gallbladder is contracted. There is suggestion of a small 0.5 cm
stone within the gallbladder. Mild gallbladder wall thickening at
the fundus of the gallbladder may simply reflect decompression. No
ultrasonographic Murphy's sign is elicited. No pericholecystic fluid
is seen.

Common bile duct:

Diameter: 0.6 cm, within normal limits in caliber.

Liver:

No focal lesion identified. Diffusely increased parenchymal
echogenicity and coarsened echotexture, likely reflecting fatty
infiltration.
IMPRESSION: 1. No acute abnormality seen at the right upper quadrant.
2. Gallbladder contracted, with a small stone in the gallbladder.
Mild fundal gallbladder wall thickening may simply reflect
decompression. No evidence for obstruction or cholecystitis.
3. Diffuse fatty infiltration within the liver.

## 2017-02-01 ENCOUNTER — Telehealth: Payer: Self-pay

## 2017-02-01 NOTE — Telephone Encounter (Signed)
FMLA paperwork ready and up at the front for pick-up by the patient

## 2017-02-04 NOTE — Telephone Encounter (Signed)
Patient already downloaded and submitted the FMLA paperwork through his mychart. Patient does not need the hard copy at the front office. Please discard per patient.

## 2017-02-07 NOTE — Telephone Encounter (Signed)
Noted thank you

## 2017-02-25 ENCOUNTER — Other Ambulatory Visit: Payer: Self-pay | Admitting: Internal Medicine

## 2017-02-25 DIAGNOSIS — R0781 Pleurodynia: Secondary | ICD-10-CM

## 2017-02-26 ENCOUNTER — Emergency Department (HOSPITAL_COMMUNITY): Payer: Federal, State, Local not specified - PPO

## 2017-02-26 ENCOUNTER — Encounter (HOSPITAL_COMMUNITY): Payer: Self-pay | Admitting: Emergency Medicine

## 2017-02-26 ENCOUNTER — Emergency Department (HOSPITAL_COMMUNITY)
Admission: EM | Admit: 2017-02-26 | Discharge: 2017-02-26 | Disposition: A | Discharge status: Home / Self Care | Payer: Federal, State, Local not specified - PPO | Attending: Emergency Medicine | Admitting: Emergency Medicine

## 2017-02-26 DIAGNOSIS — Z7984 Long term (current) use of oral hypoglycemic drugs: Secondary | ICD-10-CM | POA: Insufficient documentation

## 2017-02-26 DIAGNOSIS — Z79899 Other long term (current) drug therapy: Secondary | ICD-10-CM | POA: Diagnosis not present

## 2017-02-26 DIAGNOSIS — N281 Cyst of kidney, acquired: Secondary | ICD-10-CM | POA: Diagnosis not present

## 2017-02-26 DIAGNOSIS — E119 Type 2 diabetes mellitus without complications: Secondary | ICD-10-CM | POA: Diagnosis not present

## 2017-02-26 DIAGNOSIS — I10 Essential (primary) hypertension: Secondary | ICD-10-CM | POA: Insufficient documentation

## 2017-02-26 DIAGNOSIS — N200 Calculus of kidney: Secondary | ICD-10-CM | POA: Diagnosis not present

## 2017-02-26 DIAGNOSIS — M549 Dorsalgia, unspecified: Secondary | ICD-10-CM | POA: Diagnosis not present

## 2017-02-26 DIAGNOSIS — R109 Unspecified abdominal pain: Secondary | ICD-10-CM | POA: Insufficient documentation

## 2017-02-26 LAB — CBC
HCT: 41.6 % (ref 39.0–52.0)
Hemoglobin: 14.3 g/dL (ref 13.0–17.0)
MCH: 29.4 pg (ref 26.0–34.0)
MCHC: 34.4 g/dL (ref 30.0–36.0)
MCV: 85.4 fL (ref 78.0–100.0)
PLATELETS: 257 10*3/uL (ref 150–400)
RBC: 4.87 MIL/uL (ref 4.22–5.81)
RDW: 12.8 % (ref 11.5–15.5)
WBC: 5.6 10*3/uL (ref 4.0–10.5)

## 2017-02-26 LAB — BASIC METABOLIC PANEL
Anion gap: 7 (ref 5–15)
BUN: 10 mg/dL (ref 6–20)
CO2: 26 mmol/L (ref 22–32)
Calcium: 9 mg/dL (ref 8.9–10.3)
Chloride: 106 mmol/L (ref 101–111)
Creatinine, Ser: 0.71 mg/dL (ref 0.61–1.24)
GFR calc non Af Amer: >60 mL/min (ref >60)
Glucose, Bld: 96 mg/dL (ref 65–99)
Potassium: 4.1 mmol/L (ref 3.5–5.1)
Sodium: 139 mmol/L (ref 135–145)

## 2017-02-26 LAB — URINALYSIS, ROUTINE W REFLEX MICROSCOPIC
BILIRUBIN URINE: NEGATIVE
Glucose, UA: NEGATIVE mg/dL
Hgb urine dipstick: NEGATIVE
Ketones, ur: NEGATIVE mg/dL
Leukocytes, UA: NEGATIVE
NITRITE: NEGATIVE
Protein, ur: NEGATIVE mg/dL
Specific Gravity, Urine: 1.025 (ref 1.005–1.030)
pH: 6 (ref 5.0–8.0)

## 2017-02-26 MED ORDER — IBUPROFEN 400 MG PO TABS: 400.0000 mg | ORAL_TABLET | Freq: Three times a day (TID) | ORAL | 0 refills | Status: DC | PRN

## 2017-02-26 MED ORDER — MORPHINE SULFATE (PF) 4 MG/ML IV SOLN
4.0000 mg | Freq: Once | INTRAVENOUS | Status: AC
  Administered 2017-02-26: 4 mg via INTRAVENOUS
  Filled 2017-02-26: qty 1

## 2017-02-26 NOTE — ED Provider Notes (Signed)
Paul Flowers Provider Note   CSN: [id number] Arrival date & time: 02/26/17  0423     History   Chief Complaint Chief Complaint  Patient presents with  . Back Pain    HPI  Paul Flowers is a 46 y.o. Male with a history of DM2, HTN and HLD, presenting for evaluation of back pain. Patient reports pain began Thursday in the middle of the back on the left side. He describes the pain as a dull ache with intermittent stabbing pains. 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 He reports initially ibuprofen provided some relief, but no longer helps. Patient denies any lower extremity weakness or numbness, and no loss of bowel or bladder control. Patient denies personal history of kidney stones. Denies fever, chills, nausea, vomiting, dysuria or hematuria. No abdominal pain, chest pain, shortness of breath. Denies tobacco use.     Past Medical History:  Diagnosis Date  . Allergic rhinitis   . ALLERGIC RHINITIS 01/19/2007  . Diabetes ([place])    diet controlled  . GERD 01/19/2007  . GERD (gastroesophageal reflux disease)   . Hypertension    Slight  . Sleep apnea 01/29/2011    Patient Active Problem List   Diagnosis Date Noted  . IBS (irritable bowel syndrome) 06/15/2016  . High cholesterol 06/11/2015  . 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 Controlled type 2 diabetes mellitus without complication, without long-term current use of insulin ([employer]) 09/24/2014  . HTN (hypertension) 11/09/2013  . Routine health maintenance 02/12/2013  . OSA (obstructive sleep apnea) 03/19/2011  . Allergic rhinitis 01/19/2007  . GERD 01/19/2007    Past Surgical History:  Procedure Laterality Date  . LEFT HEART CATHETERIZATION WITH CORONARY ANGIOGRAM N/A 12/11/2013   Procedure: LEFT HEART CATHETERIZATION WITH CORONARY ANGIOGRAM;  Surgeon: Josue Hector, MD;  Location: [facility] CATH LAB;  Service: Cardiovascular;  Laterality: N/A;        Home Medications    Prior to Admission medications   Medication Sig Start Date End Date Taking? Authorizing Provider  atorvastatin (LIPITOR) 20 MG tablet Take 1 tablet (20 mg total) by mouth daily. 06/15/16   Bedsole, Amy E, MD  glucose blood (ONETOUCH VERIO) test strip Use to test blood sugar once daily 08/23/16   Elayne Snare, MD  ibuprofen (ADVIL,MOTRIN) 200 MG tablet Take 400 mg by mouth every 6 (six) hours as needed.    [provider]  JANUMET XR [phone] MG TB24 TAKE 1 TABLET BY MOUTH DAILY 12/19/16   Elayne Snare, MD  losartan (COZAAR) 25 MG tablet TAKE 1 TABLET (25 MG TOTAL) BY MOUTH DAILY. 12/17/16   Bedsole, Amy E, MD  mometasone (NASONEX) 50 MCG/ACT nasal spray PLACE 2 SPRAYS INTO THE NOSE DAILY. 09/07/16   Bedsole, Amy E, MD  montelukast (SINGULAIR) 10 MG tablet TAKE 1 TABLET (10 MG TOTAL) BY MOUTH AT BEDTIME. 10/19/16   Bedsole, Amy E, MD  Multiple Vitamin (MULTIVITAMIN WITH MINERALS) TABS tablet Take 1 tablet by mouth daily.    [provider]  ONE TOUCH LANCETS MISC Use to test blood sugar once daily 08/23/16   Elayne Snare, MD  pantoprazole (PROTONIX) 40 MG tablet TAKE 1 TABLET (40 MG TOTAL) BY MOUTH DAILY. 10/14/16   Jinny Sanders, MD    Family History Family History  Problem Relation Age of Onset  . Hypertension Mother   . Heart disease Father 1  . Hyperlipidemia Father   .  Colonic polyp Father   . Diabetes Father   . Diabetes Maternal Grandmother   . Heart disease Maternal Grandmother   . Stroke Maternal Grandfather   . Colon cancer Neg Hx     Social History Social History  Substance Use Topics  . Smoking status: Never Smoker  . Smokeless tobacco: Never Used  . Alcohol use 0.0 oz/week     Comment: occ     Allergies   Patient has no known allergies.   Review of Systems Review of Systems  Constitutional: Negative for appetite change, chills and fever.  HENT: Negative for congestion, ear pain, rhinorrhea and sore throat.   Eyes:  Negative for photophobia and visual disturbance.  Respiratory: Negative for cough, chest tightness and shortness of breath.   Cardiovascular: Negative for chest pain, palpitations and leg swelling.  Gastrointestinal: Negative for abdominal distention, abdominal pain, blood in stool, diarrhea, nausea and vomiting.  Genitourinary: Positive for flank pain. Negative for difficulty urinating, dysuria and hematuria.  Musculoskeletal: Positive for back pain. Negative for myalgias and neck pain.  Skin: Negative for rash and wound.  Neurological: Negative for dizziness, weakness, light-headedness, numbness and headaches.     Physical Exam Updated Vital Signs BP 136/77 (BP Location: Right Arm)   Pulse 70   Temp 97.9 F (36.6 C) (Oral)   Resp 17   Ht 5\' 10"  (1.778 m)   Wt 78.5 kg (173 lb)   SpO2 99%   BMI 24.82 kg/m   Physical Exam  Constitutional: He appears well-developed and well-nourished. No distress.  HENT:  Head: Normocephalic and atraumatic.  Eyes: Pupils are equal, round, and reactive to light. EOM are normal. Right eye exhibits no discharge. Left eye exhibits no discharge.  Neck: Neck supple.  c-spine NTTP  Cardiovascular: Normal rate, regular rhythm, normal heart sounds and intact distal pulses.   Pulmonary/Chest: Effort normal and breath sounds normal. No respiratory distress.  Abdominal: Soft. Bowel sounds are normal.  Mild suprapubic tenderness, NTTP in all other quadrants. No masses, guarding or rebound tenderness. No CVA tenderness  Musculoskeletal: He exhibits no edema or deformity.  NTTP at midline along entire spine, NTTP at left mid back where aptietn reports pain  Neurological: He is alert. Coordination normal.  Speech is clear, able to follow commands CN III-XII intact Normal strength in upper and lower extremities bilaterally including dorsiflexion and plantar flexion, strong and equal grip strength Sensation normal to light and sharp touch Moves extremities  without ataxia, coordination intact    Skin: Skin is warm and dry. Capillary refill takes less than 2 seconds. He is not diaphoretic.  Psychiatric: He has a normal mood and affect. His behavior is normal.  Nursing note and vitals reviewed.    ED Treatments / Results  Labs (all labs ordered are listed, but only abnormal results are displayed) Labs Reviewed  CBC  URINALYSIS, ROUTINE W REFLEX MICROSCOPIC  BASIC METABOLIC PANEL    EKG  EKG Interpretation None       Radiology Ct Renal Stone Study  Result Date: 02/26/2017 CLINICAL DATA:  Left-sided flank and back pain for 3 days. EXAM: CT ABDOMEN AND PELVIS WITHOUT CONTRAST TECHNIQUE: Multidetector CT imaging of the abdomen and pelvis was performed following the standard protocol without IV contrast. COMPARISON:  10/29/2014 FINDINGS: Lower chest: No acute findings. Hepatobiliary: No masses visualized on this unenhanced exam. Gallbladder is unremarkable. Pancreas: No mass or inflammatory process visualized on this unenhanced exam. Spleen:  Within normal limits in size. Adrenals/Urinary tract: Several  tiny 2-3 mm nonobstructing calculi are seen within the right kidney . No evidence of ureteral calculi or hydronephrosis. A few left renal cysts are again noted. Unremarkable unopacified urinary bladder. Stomach/Bowel: No evidence of obstruction, inflammatory process, or abnormal fluid collections. Normal appendix visualized. Vascular/Lymphatic: No pathologically enlarged lymph nodes identified. No evidence of abdominal aortic aneurysm. Reproductive:  No mass or other significant abnormality. Other:  None. Musculoskeletal:  No suspicious bone lesions identified. IMPRESSION: Nonobstructing right nephrolithiasis. No evidence of ureteral calculi, hydronephrosis, or other acute findings. Electronically Signed   By: Earle Gell M.D.   On: 02/26/2017 10:01    Procedures Procedures (including critical care time)  Medications Ordered in ED Medications   morphine 4 MG/ML injection 4 mg (4 mg Intravenous Given 02/26/17 0951)     Initial Impression / Assessment and Plan / ED Course  I have reviewed the triage vital signs and the nursing notes.  Pertinent labs & imaging results that were available during my care of the patient were reviewed by me and considered in my medical decision making (see chart for details).  Patient with 3 days left sided back pain without inciting injury and non-tender on palp, concerning for kidney stones. Will order CBC, BMP and UA as well as renal stone study. Patient afebrile, vitals normal. Pain management with morphine.  CT scan shows left renal cyst, which was present on previous image in 2016, as well as small nonobstructive right renal calculi. Labs unremarkable, kidney function good. Results discussed with patient and he expressed understanding. Patient reports pain is much improved. Plan to discharge home with follow-up with urology. Ibuprofen at home for pain management. Return precautions provided and patient in agreement with plan.  Final Clinical Impressions(s) / ED Diagnoses   Final diagnoses:  Flank pain  Renal cyst, left    New Prescriptions Discharge Medication List as of 02/26/2017 11:19 AM    START taking these medications   Details  !! ibuprofen (ADVIL,MOTRIN) 400 MG tablet Take 1 tablet (400 mg total) by mouth every 8 (eight) hours as needed., Starting Sat 02/26/2017, Print     !! - Potential duplicate medications found. Please discuss with provider.       Jacqlyn Larsen, PA-C 02/26/17 1635    Carmin Muskrat, MD 02/27/17 [phone]

## 2017-02-26 NOTE — Discharge Instructions (Signed)
Back pain is likely related to renal cyst and/or passed kidney stones. Take 400mg  Ibuprofen 3 times daily for pain management. Follow up with Urology in the next week. Return to ED if pain worsens, you develop blood in your urine or are not able to urinate. Follow up with your primary doctor as needed.

## 2017-02-26 NOTE — ED Triage Notes (Signed)
Pt reports onset of back approx. 3 days left arm pain x1 week . Ibuprophen  Now ineffective denies event or injury

## 2017-04-05 ENCOUNTER — Other Ambulatory Visit (INDEPENDENT_AMBULATORY_CARE_PROVIDER_SITE_OTHER): Payer: Federal, State, Local not specified - PPO

## 2017-04-05 DIAGNOSIS — E119 Type 2 diabetes mellitus without complications: Secondary | ICD-10-CM | POA: Diagnosis not present

## 2017-04-05 LAB — COMPREHENSIVE METABOLIC PANEL
ALK PHOS: 48 U/L (ref 39–117)
ALT: 16 U/L (ref 0–53)
AST: 15 U/L (ref 0–37)
Albumin: 4.3 g/dL (ref 3.5–5.2)
BILIRUBIN TOTAL: 1.4 mg/dL — AB (ref 0.2–1.2)
BUN: 11 mg/dL (ref 6–23)
CALCIUM: 9.6 mg/dL (ref 8.4–10.5)
CO2: 29 mEq/L (ref 19–32)
Chloride: 104 mEq/L (ref 96–112)
Creatinine, Ser: 0.8 mg/dL (ref 0.40–1.50)
GFR: 132.62 mL/min (ref >60.00)
GLUCOSE: 100 mg/dL — AB (ref 70–99)
Potassium: 4.4 mEq/L (ref 3.5–5.1)
Sodium: 141 mEq/L (ref 135–145)
TOTAL PROTEIN: 7 g/dL (ref 6.0–8.3)

## 2017-04-05 LAB — HEMOGLOBIN A1C: Hgb A1c MFr Bld: 5.8 % (ref 4.6–6.5)

## 2017-04-07 NOTE — Progress Notes (Signed)
Patient ID: Paul Flowers, male   DOB: 25-Apr-1971, 46 y.o.   MRN: [id number]            Reason for Appointment: Follow-up of diabetes  Referring physician: Diona Browner   History of Present Illness:          Date of diagnosis of type 2 diabetes mellitus: 12/17        Background history:   He had been told to have prediabetes about 2 years ago with highest A1c only 6.5 but no significant hyperglycemia He had not been following any particular diet or exercising and his A1c initially came down to 6.1 However in 12/17 his A1c was up to 6.8 In the third week of January 2018 he started having fatigue, severely increased thirst, frequent urination and was found to have marked hyperglycemia with blood sugar of 620 without Acidosis , had small ketones in the urine He was discharged on metformin  Recent history:   Non-insulin hypoglycemic drugs the patient is taking are: Janumet XR 100/1000 daily  He was switched from metformin to Janumet XR on his initial consultation in 08/2016 because of diarrhea with metformin  Most recent is 5.9, previously A1c 7.2% done in 3/18  Current management, blood sugar patterns and problems identified:  He works night shifts  Checking blood sugars mostly when he wakes up and probably before his evening meal mostly rather than after  No side effects with Janumet XR which he takes once a day  Overall feels fairly good  He does usually watches portions and avoiding high-fat high carbohydrate meals   Has been fairly active but not doing any programmed exercise        Side effects from medications have been: diarrhea from metformin  Glucose monitoring:  done 1-2 times a day         Glucometer:  One Touch Verio  Blood Glucose readings by time of day and averages from Home monitor :  RANGE 72-118, with median 94, most readings in the evenings before and after eating breakfast   Self-care: The diet that the patient has been following is: tries to limit   Carbohydrates, sweets and drinks with sugar.     Meal times are:  Breakfast is at  7 AM Lunch: at work during [employer]: 8 pm                Dietician visit, most recent: 10/2016               Exercise:  mostly doing walking at work   Weight history:  Wt Readings from Last 3 Encounters:  04/08/17 171 lb 12.8 oz (77.9 kg)  02/26/17 173 lb (78.5 kg)  01/07/17 173 lb (78.5 kg)    Glycemic control:   Lab Results  Component Value Date   HGBA1C 5.8 04/05/2017   HGBA1C 5.9 01/03/2017   HGBA1C 7.2 (H) 09/15/2016   Lab Results  Component Value Date   MICROALBUR 0.8 01/03/2017   LDLCALC 53 09/15/2016   CREATININE 0.80 04/05/2017   Lab Results  Component Value Date   MICRALBCREAT 0.4 01/03/2017    Lab Results  Component Value Date   FRUCTOSAMINE 277 09/15/2016     Allergies as of 04/08/2017   No Known Allergies     Medication List       Accurate as of 04/08/17  1:50 PM. Always use your most recent med list.          atorvastatin 20 MG  tablet Commonly known as:  LIPITOR Take 1 tablet (20 mg total) by mouth daily.   glucose blood test strip Commonly known as:  ONETOUCH VERIO Use to test blood sugar once daily   ibuprofen 200 MG tablet Commonly known as:  ADVIL,MOTRIN Take 400-800 mg by mouth every 6 (six) hours as needed for mild pain.   ibuprofen 400 MG tablet Commonly known as:  ADVIL,MOTRIN Take 1 tablet (400 mg total) by mouth every 8 (eight) hours as needed.   JANUMET XR [phone] MG Tb24 Generic drug:  SitaGLIPtin-MetFORMIN HCl TAKE 1 TABLET BY MOUTH DAILY   losartan 25 MG tablet Commonly known as:  COZAAR TAKE 1 TABLET (25 MG TOTAL) BY MOUTH DAILY.   mometasone 50 MCG/ACT nasal spray Commonly known as:  NASONEX PLACE 2 SPRAYS INTO THE NOSE DAILY.   montelukast 10 MG tablet Commonly known as:  SINGULAIR TAKE 1 TABLET (10 MG TOTAL) BY MOUTH AT BEDTIME.   multivitamin with minerals Tabs tablet Take 1 tablet by mouth daily.   ONE TOUCH  LANCETS Misc Use to test blood sugar once daily   pantoprazole 40 MG tablet Commonly known as:  PROTONIX TAKE 1 TABLET (40 MG TOTAL) BY MOUTH DAILY.       Allergies: No Known Allergies  Past Medical History:  Diagnosis Date  . Allergic rhinitis   . ALLERGIC RHINITIS 01/19/2007  . Diabetes ([place])    diet controlled  . GERD 01/19/2007  . GERD (gastroesophageal reflux disease)   . Hypertension    Slight  . Sleep apnea 01/29/2011    Past Surgical History:  Procedure Laterality Date  . LEFT HEART CATHETERIZATION WITH CORONARY ANGIOGRAM N/A 12/11/2013   Procedure: LEFT HEART CATHETERIZATION WITH CORONARY ANGIOGRAM;  Surgeon: Josue Hector, MD;  Location: [facility] CATH LAB;  Service: Cardiovascular;  Laterality: N/A;    Family History  Problem Relation Age of Onset  . Hypertension Mother   . Heart disease Father 47  . Hyperlipidemia Father   . Colonic polyp Father   . Diabetes Father   . Diabetes Maternal Grandmother   . Heart disease Maternal Grandmother   . Stroke Maternal Grandfather   . Colon cancer Neg Hx     Social History:  reports that he has never smoked. He has never used smokeless tobacco. He reports that he drinks alcohol. He reports that he does not use drugs.   Review of Systems   Lipid history: He has been  taking Lipitor 20 mg daily for baseline LDL of 141 and long-standing history of hypercholesterolemia This is followed by his PCP   Lab Results  Component Value Date   CHOL 107 09/15/2016   HDL 43.30 09/15/2016   LDLCALC 53 09/15/2016   LDLDIRECT 149.3 02/12/2013   TRIG 53.0 09/15/2016   CHOLHDL 2 09/15/2016           Hypertension:  treated with  25 mg losartan, Followed by PCP  Most recent eye exam was In 1/18  Most recent foot exam: 2/18   Physical Examination:  BP 128/80   Pulse 71   Ht 5\' 10"  (1.778 m)   Wt 171 lb 12.8 oz (77.9 kg)   SpO2 98%   BMI 24.65 kg/m       ASSESSMENT:  Diabetes type 2, Nonobese  See history of present  illness for detailed discussion of current diabetes management, blood sugar patterns and problems identified  Patient has had excellent blood sugars  A1c is again normal at 5.8 He  is only on Janumet XR 100/1000 Even though he had severe diabetes at the onset his blood sugars are very well controlled indicating near normal pancreatic function He is also fairly motivated to continue watching his diet and stay active  LIPIDS: He will discuss checking the lipid with his PCP at his upcoming physical   PLAN:    He will stay on the same regimen of Janumet XR.  He should try to check more readings after meals to verify that blood sugars are also controlled postprandially  Encouraged him to start walking program on his days off  Follow-up in 6 months     There are no Patient Instructions on file for this visit.    Tamyka Bezio 04/08/2017, 1:50 PM   Note: This office note was prepared with Estate agent. Any transcriptional errors that result from this process are unintentional.

## 2017-04-08 ENCOUNTER — Encounter: Payer: Self-pay | Admitting: Endocrinology

## 2017-04-08 ENCOUNTER — Ambulatory Visit (INDEPENDENT_AMBULATORY_CARE_PROVIDER_SITE_OTHER): Payer: Federal, State, Local not specified - PPO | Admitting: Endocrinology

## 2017-04-08 VITALS — BP 128/80 | HR 71 | Ht 70.0 in | Wt 171.8 lb

## 2017-04-08 DIAGNOSIS — E1165 Type 2 diabetes mellitus with hyperglycemia: Secondary | ICD-10-CM

## 2017-04-24 ENCOUNTER — Other Ambulatory Visit: Payer: Self-pay | Admitting: Endocrinology

## 2017-06-20 DIAGNOSIS — K080 Exfoliation of teeth due to systemic causes: Secondary | ICD-10-CM | POA: Diagnosis not present

## 2017-06-23 ENCOUNTER — Other Ambulatory Visit: Payer: Self-pay | Admitting: Family Medicine

## 2017-07-04 ENCOUNTER — Other Ambulatory Visit: Payer: Self-pay | Admitting: Family Medicine

## 2017-07-05 DIAGNOSIS — Z87442 Personal history of urinary calculi: Secondary | ICD-10-CM

## 2017-07-05 HISTORY — DX: Personal history of urinary calculi: Z87.442

## 2017-08-01 LAB — HM DIABETES EYE EXAM

## 2017-08-15 ENCOUNTER — Telehealth: Payer: Self-pay | Admitting: *Deleted

## 2017-08-15 NOTE — Telephone Encounter (Signed)
Received fax from [facility] stating PA is required if patient is going to be on this medication long term.  PA completed on CoverMyMeds and approved.  [facility] notified of approval via fax.

## 2017-08-28 ENCOUNTER — Other Ambulatory Visit: Payer: Self-pay | Admitting: Family Medicine

## 2017-09-01 ENCOUNTER — Encounter: Payer: Federal, State, Local not specified - PPO | Admitting: Family Medicine

## 2017-09-13 ENCOUNTER — Other Ambulatory Visit: Payer: Self-pay | Admitting: Family Medicine

## 2017-09-30 ENCOUNTER — Other Ambulatory Visit: Payer: Self-pay | Admitting: Family Medicine

## 2017-10-04 ENCOUNTER — Encounter: Payer: Self-pay | Admitting: Family Medicine

## 2017-10-07 ENCOUNTER — Other Ambulatory Visit (INDEPENDENT_AMBULATORY_CARE_PROVIDER_SITE_OTHER): Payer: Federal, State, Local not specified - PPO

## 2017-10-07 DIAGNOSIS — E1165 Type 2 diabetes mellitus with hyperglycemia: Secondary | ICD-10-CM | POA: Diagnosis not present

## 2017-10-07 LAB — COMPREHENSIVE METABOLIC PANEL
ALBUMIN: 4.3 g/dL (ref 3.5–5.2)
ALT: 23 U/L (ref 0–53)
AST: 21 U/L (ref 0–37)
Alkaline Phosphatase: 52 U/L (ref 39–117)
BILIRUBIN TOTAL: 1.6 mg/dL — AB (ref 0.2–1.2)
BUN: 11 mg/dL (ref 6–23)
CALCIUM: 9.3 mg/dL (ref 8.4–10.5)
CHLORIDE: 103 meq/L (ref 96–112)
CO2: 27 meq/L (ref 19–32)
CREATININE: 0.79 mg/dL (ref 0.40–1.50)
GFR: 134.27 mL/min (ref >60.00)
Glucose, Bld: 96 mg/dL (ref 70–99)
Potassium: 3.7 mEq/L (ref 3.5–5.1)
Sodium: 140 mEq/L (ref 135–145)
Total Protein: 7.1 g/dL (ref 6.0–8.3)

## 2017-10-07 LAB — LIPID PANEL
CHOL/HDL RATIO: 2
CHOLESTEROL: 119 mg/dL (ref 0–200)
HDL: 56.1 mg/dL (ref >39.00)
LDL CALC: 52 mg/dL (ref 0–99)
NonHDL: 62.83
TRIGLYCERIDES: 54 mg/dL (ref 0.0–149.0)
VLDL: 10.8 mg/dL (ref 0.0–40.0)

## 2017-10-07 LAB — HEMOGLOBIN A1C: Hgb A1c MFr Bld: 5.8 % (ref 4.6–6.5)

## 2017-10-10 NOTE — Progress Notes (Signed)
Patient ID: Paul Flowers, male   DOB: [birth year], 47 y.o.   MRN: [id number]            Reason for Appointment: Follow-up of diabetes  Referring physician: Diona Browner   History of Present Illness:          Date of diagnosis of type 2 diabetes mellitus: 12/17        Background history:   He had been told to have prediabetes about 2 years ago with highest A1c only 6.5 but no significant hyperglycemia He had not been following any particular diet or exercising and his A1c initially came down to 6.1 However in 12/17 his A1c was up to 6.8 In the third week of January 2018 he started having fatigue, severely increased thirst, frequent urination and was found to have marked hyperglycemia with blood sugar of 620 without Acidosis , had small ketones in the urine He was discharged on metformin  Recent history:   Non-insulin hypoglycemic drugs the patient is taking are: Janumet XR 100/1000 daily  He was switched from metformin to Janumet XR on his initial consultation in 08/2016 because of diarrhea with metformin  Most recent is 5.8 again    Current management, blood sugar patterns and problems identified:  He is still doing very well with only taking Janumet XR  Blood sugars at home are averaging just over 100 although most of the readings probably are when he wakes up in the evening  He has however gained weight since his last visit and he is not always controlling portions  He is not motivated to do any exercise since he does some walking at work        Side effects from medications have been: diarrhea from metformin  Glucose monitoring:  done 0.5 times a day         Glucometer:  One Touch Verio  Blood Glucose readings by time of day and averages from download of the monitor :  RANGE 66-138, median 104, most readings done in the evenings around 8 PM   Self-care: The diet that the patient has been following is: tries to limit  Carbohydrates, sweets and drinks with sugar.     Meal  times are:  Breakfast is at  7 AM Lunch: at work during [employer]: 8 pm                Dietician visit, most recent: 10/2016               Exercise:  doing walking at work, no formal exercise  Weight history:  Wt Readings from Last 3 Encounters:  10/11/17 176 lb (79.8 kg)  04/08/17 171 lb 12.8 oz (77.9 kg)  02/26/17 173 lb (78.5 kg)    Glycemic control:   Lab Results  Component Value Date   HGBA1C 5.8 10/07/2017   HGBA1C 5.8 04/05/2017   HGBA1C 5.9 01/03/2017   Lab Results  Component Value Date   MICROALBUR 0.8 01/03/2017   LDLCALC 52 10/07/2017   CREATININE 0.79 10/07/2017   Lab Results  Component Value Date   MICRALBCREAT 0.4 01/03/2017    Lab Results  Component Value Date   FRUCTOSAMINE 277 09/15/2016     Allergies as of 10/11/2017   No Known Allergies     Medication List        Accurate as of 10/11/17  9:04 PM. Always use your most recent med list.          atorvastatin 20  MG tablet Commonly known as:  LIPITOR TAKE 1 TABLET (20 MG TOTAL) BY MOUTH DAILY.   glucose blood test strip Commonly known as:  ONETOUCH VERIO Use to test blood sugar once daily   ibuprofen 200 MG tablet Commonly known as:  ADVIL,MOTRIN Take 400-800 mg by mouth every 6 (six) hours as needed for mild pain.   JANUMET XR [phone] MG Tb24 Generic drug:  SitaGLIPtin-MetFORMIN HCl TAKE 1 TABLET BY MOUTH DAILY   losartan 25 MG tablet Commonly known as:  COZAAR TAKE 1 TABLET (25 MG TOTAL) BY MOUTH DAILY.   mometasone 50 MCG/ACT nasal spray Commonly known as:  NASONEX PLACE 2 SPRAYS INTO THE NOSE DAILY.   montelukast 10 MG tablet Commonly known as:  SINGULAIR TAKE 1 TABLET (10 MG TOTAL) BY MOUTH AT BEDTIME.   multivitamin with minerals Tabs tablet Take 1 tablet by mouth daily.   ONE TOUCH LANCETS Misc Use to test blood sugar once daily   pantoprazole 40 MG tablet Commonly known as:  PROTONIX TAKE 1 TABLET (40 MG TOTAL) BY MOUTH DAILY.       Allergies: No  Known Allergies  Past Medical History:  Diagnosis Date  . Allergic rhinitis   . ALLERGIC RHINITIS 01/19/2007  . Diabetes ([place])    diet controlled  . GERD 01/19/2007  . GERD (gastroesophageal reflux disease)   . Hypertension    Slight  . Sleep apnea 01/29/2011    Past Surgical History:  Procedure Laterality Date  . LEFT HEART CATHETERIZATION WITH CORONARY ANGIOGRAM N/A 12/11/2013   Procedure: LEFT HEART CATHETERIZATION WITH CORONARY ANGIOGRAM;  Surgeon: Josue Hector, MD;  Location: [facility] CATH LAB;  Service: Cardiovascular;  Laterality: N/A;    Family History  Problem Relation Age of Onset  . Hypertension Mother   . Heart disease Father 42  . Hyperlipidemia Father   . Colonic polyp Father   . Diabetes Father   . Diabetes Maternal Grandmother   . Heart disease Maternal Grandmother   . Stroke Maternal Grandfather   . Colon cancer Neg Hx     Social History:  reports that he has never smoked. He has never used smokeless tobacco. He reports that he drinks alcohol. He reports that he does not use drugs.   Review of Systems   Lipid history: He has been  taking Lipitor 20 mg daily for baseline LDL of 141 and long-standing history of hypercholesterolemia  Recent results are excellent:   Lab Results  Component Value Date   CHOL 119 10/07/2017   HDL 56.10 10/07/2017   LDLCALC 52 10/07/2017   LDLDIRECT 149.3 02/12/2013   TRIG 54.0 10/07/2017   CHOLHDL 2 10/07/2017           Hypertension:  treated with  25 mg losartan, Followed by PCP Blood pressure is high normal today, better on second measurement  BP Readings from Last 3 Encounters:  10/11/17 122/82  04/08/17 128/80  02/26/17 125/79     Most recent eye exam was In 1/18  Most recent foot exam: 10/2017   Physical Examination:  BP 122/82   Pulse 88   Ht 5\' 10"  (1.778 m)   Wt 176 lb (79.8 kg)   SpO2 99%   BMI 25.25 kg/m   Diabetic Foot Exam - Simple   Simple Foot Form Diabetic Foot exam was performed with the  following findings:  Yes 10/11/2017  9:55 AM  Visual Inspection No deformities, no ulcerations, no other skin breakdown bilaterally:  Yes Sensation Testing Intact  to touch and monofilament testing bilaterally:  Yes Pulse Check Posterior Tibialis and Dorsalis pulse intact bilaterally:  Yes Comments       ASSESSMENT:  Diabetes type 2, Nonobese  See history of present illness for detailed discussion of current diabetes management, blood sugar patterns and problems identified   A1c is again normal at 5.8 He is only on Janumet XR 100/1000 1 tablet daily He has fairly good blood sugars at home although not always taking after meals He may have gained some weight with not being as active and inconsistent control of portions Discussed importance of keeping his weight the same or slightly lower and doing formal exercise  Even though he had severe diabetes at the onset his blood sugars are very well controlled indicating near normal pancreatic function He is also fairly motivated to continue watching his diet and stay active  LIPIDS: He will continue Lipitor as his levels are excellent now  Mild hypertension: He will follow-up with PCP for recheck   PLAN:    He will stay on the same dose of Janumet XR.  He will start walking at least on his days off and when he is not is active otherwise  More readings after meals  Consistent diet with control of portions and carbohydrates  Again follow-up in 6 months      Patient Instructions  Walk daily     Elayne Snare 10/11/2017, 9:04 PM   Note: This office note was prepared with Dragon voice recognition system technology. Any transcriptional errors that result from this process are unintentional.

## 2017-10-11 ENCOUNTER — Ambulatory Visit: Payer: Federal, State, Local not specified - PPO | Admitting: Endocrinology

## 2017-10-11 ENCOUNTER — Encounter: Payer: Self-pay | Admitting: Endocrinology

## 2017-10-11 VITALS — BP 122/82 | HR 88 | Ht 70.0 in | Wt 176.0 lb

## 2017-10-11 DIAGNOSIS — E119 Type 2 diabetes mellitus without complications: Secondary | ICD-10-CM

## 2017-10-11 NOTE — Patient Instructions (Signed)
Walk daily 

## 2017-11-09 ENCOUNTER — Other Ambulatory Visit: Payer: Self-pay | Admitting: Family Medicine

## 2017-11-09 NOTE — Telephone Encounter (Signed)
Last office visit 09/29/2016.  Lipid checked by Dr. Dwyane Dee on 10/07/2017.  Ok to refill?

## 2017-11-12 ENCOUNTER — Other Ambulatory Visit: Payer: Self-pay | Admitting: Endocrinology

## 2017-12-11 ENCOUNTER — Other Ambulatory Visit: Payer: Self-pay | Admitting: Family Medicine

## 2017-12-12 ENCOUNTER — Other Ambulatory Visit: Payer: Self-pay | Admitting: Family Medicine

## 2017-12-13 ENCOUNTER — Other Ambulatory Visit: Payer: Self-pay | Admitting: Family Medicine

## 2017-12-15 ENCOUNTER — Other Ambulatory Visit: Payer: Self-pay | Admitting: Family Medicine

## 2017-12-16 ENCOUNTER — Other Ambulatory Visit: Payer: Self-pay | Admitting: *Deleted

## 2017-12-16 MED ORDER — MONTELUKAST SODIUM 10 MG PO TABS: ORAL_TABLET | ORAL | 0 refills | Status: DC

## 2017-12-16 MED ORDER — PANTOPRAZOLE SODIUM 40 MG PO TBEC: 40.0000 mg | DELAYED_RELEASE_TABLET | Freq: Every day | ORAL | 0 refills | Status: DC

## 2017-12-20 DIAGNOSIS — K080 Exfoliation of teeth due to systemic causes: Secondary | ICD-10-CM | POA: Diagnosis not present

## 2017-12-21 ENCOUNTER — Other Ambulatory Visit: Payer: Self-pay | Admitting: Family Medicine

## 2018-01-06 ENCOUNTER — Telehealth: Payer: Self-pay | Admitting: Family Medicine

## 2018-01-06 ENCOUNTER — Other Ambulatory Visit: Payer: Self-pay | Admitting: Family Medicine

## 2018-01-06 ENCOUNTER — Other Ambulatory Visit (INDEPENDENT_AMBULATORY_CARE_PROVIDER_SITE_OTHER): Payer: Federal, State, Local not specified - PPO

## 2018-01-06 DIAGNOSIS — E7800 Pure hypercholesterolemia, unspecified: Secondary | ICD-10-CM

## 2018-01-06 DIAGNOSIS — E119 Type 2 diabetes mellitus without complications: Secondary | ICD-10-CM

## 2018-01-06 LAB — COMPREHENSIVE METABOLIC PANEL
ALT: 21 U/L (ref 0–53)
AST: 18 U/L (ref 0–37)
Albumin: 4.3 g/dL (ref 3.5–5.2)
Alkaline Phosphatase: 61 U/L (ref 39–117)
BILIRUBIN TOTAL: 1 mg/dL (ref 0.2–1.2)
BUN: 8 mg/dL (ref 6–23)
CALCIUM: 9.4 mg/dL (ref 8.4–10.5)
CO2: 30 mEq/L (ref 19–32)
Chloride: 105 mEq/L (ref 96–112)
Creatinine, Ser: 0.83 mg/dL (ref 0.40–1.50)
GFR: 126.7 mL/min (ref >60.00)
Glucose, Bld: 101 mg/dL — ABNORMAL HIGH (ref 70–99)
POTASSIUM: 3.9 meq/L (ref 3.5–5.1)
Sodium: 142 mEq/L (ref 135–145)
Total Protein: 6.8 g/dL (ref 6.0–8.3)

## 2018-01-06 LAB — LIPID PANEL
CHOL/HDL RATIO: 2
Cholesterol: 132 mg/dL (ref 0–200)
HDL: 57.3 mg/dL (ref >39.00)
LDL Cholesterol: 43 mg/dL (ref 0–99)
NonHDL: 74.65
TRIGLYCERIDES: 157 mg/dL — AB (ref 0.0–149.0)
VLDL: 31.4 mg/dL (ref 0.0–40.0)

## 2018-01-06 LAB — HEMOGLOBIN A1C: Hgb A1c MFr Bld: 6.1 % (ref 4.6–6.5)

## 2018-01-06 NOTE — Telephone Encounter (Signed)
-----   Message from Lendon Collar, RT sent at 12/26/2017 11:47 AM EDT ----- Regarding: Lab orders for Friday 01/06/18 Please enter CPE lab orders for 01/06/18. Thanks-Lauren

## 2018-01-11 ENCOUNTER — Other Ambulatory Visit: Payer: Self-pay | Admitting: Family Medicine

## 2018-01-13 ENCOUNTER — Encounter: Payer: Self-pay | Admitting: Family Medicine

## 2018-01-13 ENCOUNTER — Other Ambulatory Visit: Payer: Self-pay

## 2018-01-13 ENCOUNTER — Ambulatory Visit (INDEPENDENT_AMBULATORY_CARE_PROVIDER_SITE_OTHER): Payer: Federal, State, Local not specified - PPO | Admitting: Family Medicine

## 2018-01-13 VITALS — BP 110/70 | HR 80 | Temp 98.5°F | Ht 69.5 in | Wt 183.2 lb

## 2018-01-13 DIAGNOSIS — Z0000 Encounter for general adult medical examination without abnormal findings: Secondary | ICD-10-CM | POA: Diagnosis not present

## 2018-01-13 DIAGNOSIS — E119 Type 2 diabetes mellitus without complications: Secondary | ICD-10-CM | POA: Diagnosis not present

## 2018-01-13 DIAGNOSIS — Z125 Encounter for screening for malignant neoplasm of prostate: Secondary | ICD-10-CM

## 2018-01-13 DIAGNOSIS — E7800 Pure hypercholesterolemia, unspecified: Secondary | ICD-10-CM | POA: Diagnosis not present

## 2018-01-13 DIAGNOSIS — M79671 Pain in right foot: Secondary | ICD-10-CM | POA: Insufficient documentation

## 2018-01-13 DIAGNOSIS — M79672 Pain in left foot: Secondary | ICD-10-CM

## 2018-01-13 DIAGNOSIS — I10 Essential (primary) hypertension: Secondary | ICD-10-CM

## 2018-01-13 DIAGNOSIS — K219 Gastro-esophageal reflux disease without esophagitis: Secondary | ICD-10-CM

## 2018-01-13 LAB — HM DIABETES FOOT EXAM

## 2018-01-13 LAB — PSA: PSA: 0.35 ng/mL (ref 0.10–4.00)

## 2018-01-13 NOTE — Assessment & Plan Note (Signed)
Well controlled. Continue current medication.  

## 2018-01-13 NOTE — Assessment & Plan Note (Signed)
Excellent control. Consider over the next 6-9 month decreasing dose of Janumet.

## 2018-01-13 NOTE — Assessment & Plan Note (Signed)
Not clearly neuropathy.. More metatarsal pain bilaterally.

## 2018-01-13 NOTE — Assessment & Plan Note (Signed)
Try to wean off pantoprazole as able.

## 2018-01-13 NOTE — Patient Instructions (Addendum)
Can decrease dose  The pantoprazole to every other day.. If no reflux..  Call for lower dose of pantoprazole 20 mg.  Please stop at the lab to have labs drawn.

## 2018-01-13 NOTE — Progress Notes (Signed)
Subjective:    Patient ID: Paul Flowers, male    DOB: [birth year], 47 y.o.   MRN: [id number]  HPI   The patient is here for annual wellness exam and preventative care.    Hypertension:    At goal on losartan BP Readings from Last 3 Encounters:  01/13/18 110/70  10/11/17 122/82  04/08/17 128/80  Using medication without problems or lightheadedness:  Chest pain with exertion: Edema: Short of breath: Average home BPs: Other issues:  Diabetes:   Excellent control now on Janumet XR  Last OV with Dr. Dwyane Dee 10/2017 reviewed Lab Results  Component Value Date   HGBA1C 6.1 01/06/2018  Using medications without difficulties: Hypoglycemic episodes:none Hyperglycemic episodes:none Feet problems: no ulcer Blood Sugars averaging: FBS weekly  95-101 eye exam within last year:  Elevated Cholesterol:  Good control on atorvastatin. Lab Results  Component Value Date   CHOL 132 01/06/2018   HDL 57.30 01/06/2018   LDLCALC 43 01/06/2018   LDLDIRECT 149.3 02/12/2013   TRIG 157.0 (H) 01/06/2018   CHOLHDL 2 01/06/2018  Using medications without problems:none Muscle aches: none Diet compliance: good Exercise: trying to be more active. Other complaints:  GERD...he has noted some metallic taste in mouth. Has not tried to wean off.   Social History /Family History/Past Medical History reviewed in detail and updated in EMR if needed.,Blood pressure 110/70, pulse 80, temperature 98.5 F (36.9 C), temperature source Oral, height 5' 9.5" (1.765 m), weight 183 lb 4 oz (83.1 kg).  Review of Systems  Constitutional: Negative for fatigue and fever.  HENT: Negative for ear pain.   Eyes: Negative for pain.  Respiratory: Negative for cough and shortness of breath.   Cardiovascular: Negative for chest pain, palpitations and leg swelling.  Gastrointestinal: Negative for abdominal pain.  Genitourinary: Negative for dysuria.  Musculoskeletal: Negative for arthralgias.  Neurological: Negative for  syncope, light-headedness and headaches.  Psychiatric/Behavioral: Negative for dysphoric mood.       Objective:   Physical Exam  Constitutional: He appears well-developed and well-nourished.  Non-toxic appearance. He does not appear ill. No distress.  HENT:  Head: Normocephalic and atraumatic.  Right Ear: Hearing, tympanic membrane, external ear and ear canal normal.  Left Ear: Hearing, tympanic membrane, external ear and ear canal normal.  Nose: Nose normal.  Mouth/Throat: Uvula is midline, oropharynx is clear and moist and mucous membranes are normal.  Eyes: Pupils are equal, round, and reactive to light. Conjunctivae, EOM and lids are normal. Lids are everted and swept, no foreign bodies found.  Neck: Trachea normal, normal range of motion and phonation normal. Neck supple. Carotid bruit is not present. No thyroid mass and no thyromegaly present.  Cardiovascular: Normal rate, regular rhythm, S1 normal, S2 normal, intact distal pulses and normal pulses. Exam reveals no gallop.  No murmur heard. Pulmonary/Chest: Breath sounds normal. He has no wheezes. He has no rhonchi. He has no rales.  Abdominal: Soft. Normal appearance and bowel sounds are normal. There is no hepatosplenomegaly. There is no tenderness. There is no rebound, no guarding and no CVA tenderness. No hernia.  Lymphadenopathy:    He has no cervical adenopathy.  Neurological: He is alert. He has normal strength and normal reflexes. No cranial nerve deficit or sensory deficit. Gait normal.  Skin: Skin is warm, dry and intact. No rash noted.  Psychiatric: He has a normal mood and affect. His speech is normal and behavior is normal. Judgment normal.         Diabetic  foot exam: Normal inspection.. Sore over bilateral metatarsals. No skin breakdown No calluses  Normal DP pulses Normal sensation to light touch and monofilament Nails normal  Assessment & Plan:  The patient's preventative maintenance and recommended  screening tests for an annual wellness exam were reviewed in full today. Brought up to date unless services declined.  Counselled on the importance of diet, exercise, and its role in overall health and mortality. The patient's FH and SH was reviewed, including their home life, tobacco status, and drug and alcohol status.    Colon cancer: none HIV screen:  Refused Nonsmoker  Vaccines: Uptodate except decline Pneumovax  PSA started age 33 given AA.. Will draw today. Lab Results  Component Value Date   PSA 0.37 06/08/2016   PSA 0.30 06/04/2015   PSA 0.34 02/12/2013

## 2018-01-20 ENCOUNTER — Other Ambulatory Visit: Payer: Self-pay | Admitting: Family Medicine

## 2018-01-20 NOTE — Telephone Encounter (Signed)
Rx called to Adonis Huguenin at the pharmacy.

## 2018-01-31 ENCOUNTER — Encounter (HOSPITAL_COMMUNITY): Payer: Self-pay

## 2018-01-31 ENCOUNTER — Emergency Department (HOSPITAL_COMMUNITY): Payer: Federal, State, Local not specified - PPO

## 2018-01-31 ENCOUNTER — Emergency Department (HOSPITAL_COMMUNITY)
Admission: EM | Admit: 2018-01-31 | Discharge: 2018-01-31 | Disposition: A | Discharge status: Home / Self Care | Payer: Federal, State, Local not specified - PPO | Attending: Emergency Medicine | Admitting: Emergency Medicine

## 2018-01-31 ENCOUNTER — Other Ambulatory Visit: Payer: Self-pay

## 2018-01-31 DIAGNOSIS — R109 Unspecified abdominal pain: Secondary | ICD-10-CM | POA: Diagnosis not present

## 2018-01-31 DIAGNOSIS — Z79899 Other long term (current) drug therapy: Secondary | ICD-10-CM | POA: Insufficient documentation

## 2018-01-31 DIAGNOSIS — E119 Type 2 diabetes mellitus without complications: Secondary | ICD-10-CM | POA: Diagnosis not present

## 2018-01-31 DIAGNOSIS — Z7984 Long term (current) use of oral hypoglycemic drugs: Secondary | ICD-10-CM | POA: Insufficient documentation

## 2018-01-31 DIAGNOSIS — I10 Essential (primary) hypertension: Secondary | ICD-10-CM | POA: Insufficient documentation

## 2018-01-31 DIAGNOSIS — R1031 Right lower quadrant pain: Secondary | ICD-10-CM | POA: Diagnosis not present

## 2018-01-31 DIAGNOSIS — R319 Hematuria, unspecified: Secondary | ICD-10-CM | POA: Insufficient documentation

## 2018-01-31 DIAGNOSIS — M5489 Other dorsalgia: Secondary | ICD-10-CM | POA: Diagnosis not present

## 2018-01-31 DIAGNOSIS — M545 Low back pain: Secondary | ICD-10-CM | POA: Diagnosis not present

## 2018-01-31 DIAGNOSIS — R0902 Hypoxemia: Secondary | ICD-10-CM | POA: Diagnosis not present

## 2018-01-31 LAB — URINALYSIS, MICROSCOPIC (REFLEX): RBC / HPF: >50 RBC/hpf (ref 0–5)

## 2018-01-31 LAB — CBC WITH DIFFERENTIAL/PLATELET
ABS IMMATURE GRANULOCYTES: 0 10*3/uL (ref 0.0–0.1)
BASOS PCT: 0 %
Basophils Absolute: 0 10*3/uL (ref 0.0–0.1)
EOS ABS: 0.2 10*3/uL (ref 0.0–0.7)
Eosinophils Relative: 3 %
HEMATOCRIT: 41.8 % (ref 39.0–52.0)
Hemoglobin: 14 g/dL (ref 13.0–17.0)
IMMATURE GRANULOCYTES: 1 %
LYMPHS ABS: 1.9 10*3/uL (ref 0.7–4.0)
Lymphocytes Relative: 28 %
MCH: 28.9 pg (ref 26.0–34.0)
MCHC: 33.5 g/dL (ref 30.0–36.0)
MCV: 86.4 fL (ref 78.0–100.0)
Monocytes Absolute: 0.8 10*3/uL (ref 0.1–1.0)
Monocytes Relative: 12 %
NEUTROS ABS: 3.9 10*3/uL (ref 1.7–7.7)
NEUTROS PCT: 56 %
PLATELETS: 255 10*3/uL (ref 150–400)
RBC: 4.84 MIL/uL (ref 4.22–5.81)
RDW: 11.9 % (ref 11.5–15.5)
WBC: 7 10*3/uL (ref 4.0–10.5)

## 2018-01-31 LAB — URINALYSIS, ROUTINE W REFLEX MICROSCOPIC
Bilirubin Urine: NEGATIVE
Glucose, UA: NEGATIVE mg/dL
KETONES UR: 15 mg/dL — AB
LEUKOCYTES UA: NEGATIVE
NITRITE: NEGATIVE
PH: 6 (ref 5.0–8.0)
Protein, ur: 30 mg/dL — AB
SPECIFIC GRAVITY, URINE: 1.015 (ref 1.005–1.030)

## 2018-01-31 LAB — COMPREHENSIVE METABOLIC PANEL
ALBUMIN: 3.9 g/dL (ref 3.5–5.0)
ALT: 29 U/L (ref 0–44)
AST: 25 U/L (ref 15–41)
Alkaline Phosphatase: 55 U/L (ref 38–126)
Anion gap: 12 (ref 5–15)
BUN: 5 mg/dL — AB (ref 6–20)
CHLORIDE: 106 mmol/L (ref 98–111)
CO2: 25 mmol/L (ref 22–32)
Calcium: 9.2 mg/dL (ref 8.9–10.3)
Creatinine, Ser: 0.75 mg/dL (ref 0.61–1.24)
GFR calc Af Amer: >60 mL/min (ref >60)
GFR calc non Af Amer: >60 mL/min (ref >60)
GLUCOSE: 91 mg/dL (ref 70–99)
POTASSIUM: 3.6 mmol/L (ref 3.5–5.1)
Sodium: 143 mmol/L (ref 135–145)
Total Bilirubin: 1.6 mg/dL — ABNORMAL HIGH (ref 0.3–1.2)
Total Protein: 7.2 g/dL (ref 6.5–8.1)

## 2018-01-31 MED ORDER — DIAZEPAM 2 MG PO TABS
2.0000 mg | ORAL_TABLET | Freq: Once | ORAL | Status: AC
  Administered 2018-01-31: 2 mg via ORAL
  Filled 2018-01-31: qty 1

## 2018-01-31 MED ORDER — MORPHINE SULFATE (PF) 4 MG/ML IV SOLN
4.0000 mg | Freq: Once | INTRAVENOUS | Status: AC
  Administered 2018-01-31: 4 mg via INTRAVENOUS
  Filled 2018-01-31: qty 1

## 2018-01-31 MED ORDER — KETOROLAC TROMETHAMINE 30 MG/ML IJ SOLN
30.0000 mg | Freq: Once | INTRAMUSCULAR | Status: AC
  Administered 2018-01-31: 30 mg via INTRAVENOUS
  Filled 2018-01-31: qty 1

## 2018-01-31 MED ORDER — DIAZEPAM 2 MG PO TABS: 2.0000 mg | ORAL_TABLET | Freq: Two times a day (BID) | ORAL | 0 refills | Status: DC

## 2018-01-31 NOTE — ED Provider Notes (Signed)
Patient placed in Quick Look pathway, seen and evaluated   Chief Complaint: R flank pain  HPI:   At 1 PM pt had acute onset R flank pain. Constat and sharp with spasms, no radiation. No change in urination (hematuria, worsening with urination, or dysuria). No n/v. No abd pain. H/o kidney stones, feels similar. H/o htn, dm.  ROS: R flank pain  Physical Exam:   Gen: No distress  Neuro: Awake and Alert  Skin: Warm    Focused Exam: TTP of R flank. No ttp of abd. Soft without rigidity, guarding or distention.   Will order labs, ua, and ct renal. Morphine for further pain control.   Initiation of care has begun. The patient has been counseled on the process, plan, and necessity for staying for the completion/evaluation, and the remainder of the medical screening examination    Franchot Heidelberg, PA-C 01/31/18 1433    Tegeler, Gwenyth Allegra, MD 01/31/18 [phone]

## 2018-01-31 NOTE — ED Triage Notes (Signed)
[facility]- pt coming from home with complaints of right flank pain into his back. Hx of kidney stone with similar pain. 174mcg of fentanyl. 20g L hand.

## 2018-01-31 NOTE — ED Provider Notes (Signed)
[facility] EMERGENCY DEPARTMENT Provider Note   CSN: [id number] Arrival date & time: 01/31/18  1413     History   Chief Complaint No chief complaint on file.   HPI Paul Flowers is a 47 y.o. male.  HPI   47 year old male presents today with complaints of right-sided flank pain. Patient notes approximately 1 PM today several hours prior to arrival he developed pain in his right flank. He notes this is crampy in nature coming and going. He notes that he is unable to get comfortable, pain is worse with palpation of his right mid back. Patient denies any abdominal pain nausea vomiting fever, denies any urinary symptoms. No trauma to his back.  Past Medical History:  Diagnosis Date  . Allergic rhinitis   . ALLERGIC RHINITIS 01/19/2007  . Diabetes ([place])    diet controlled  . GERD 01/19/2007  . GERD (gastroesophageal reflux disease)   . Hypertension    Slight  . Sleep apnea 01/29/2011    Patient Active Problem List   Diagnosis Date Noted  . Bilateral foot pain 01/13/2018  . IBS (irritable bowel syndrome) 06/15/2016  . High cholesterol 06/11/2015  . 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 Controlled type 2 diabetes mellitus without complication, without long-term current use of insulin ([place]) 09/24/2014  . HTN (hypertension) 11/09/2013  . Routine health maintenance 02/12/2013  . OSA (obstructive sleep apnea) 03/19/2011  . Allergic rhinitis 01/19/2007  . GERD 01/19/2007    Past Surgical History:  Procedure Laterality Date  . LEFT HEART CATHETERIZATION WITH CORONARY ANGIOGRAM N/A 12/11/2013   Procedure: LEFT HEART CATHETERIZATION WITH CORONARY ANGIOGRAM;  Surgeon: Josue Hector, MD;  Location: [facility] CATH LAB;  Service: Cardiovascular;  Laterality: N/A;        Home Medications    Prior to Admission medications   Medication Sig Start Date End Date Taking? Authorizing Provider  atorvastatin (LIPITOR) 20 MG tablet TAKE 1 TABLET BY MOUTH EVERY DAY 11/10/17    Bedsole, Amy E, MD  diazepam (VALIUM) 2 MG tablet Take 1 tablet (2 mg total) by mouth 2 (two) times daily. 01/31/18   Luisantonio Adinolfi, Dellis Filbert, PA-C  glucose blood (ONETOUCH VERIO) test strip Use to test blood sugar once daily 08/23/16   Elayne Snare, MD  JANUMET XR [phone] MG TB24 TAKE 1 TABLET BY MOUTH DAILY 11/12/17   Elayne Snare, MD  losartan (COZAAR) 25 MG tablet TAKE 1 TABLET BY MOUTH EVERY DAY 01/20/18   Bedsole, Amy E, MD  mometasone (NASONEX) 50 MCG/ACT nasal spray PLACE 2 SPRAYS INTO THE NOSE DAILY. 12/21/17   Bedsole, Amy E, MD  montelukast (SINGULAIR) 10 MG tablet TAKE 1 TABLET BY MOUTH EVERYDAY AT BEDTIME 01/11/18   Bedsole, Amy E, MD  Multiple Vitamin (MULTIVITAMIN WITH MINERALS) TABS tablet Take 1 tablet by mouth daily.    [provider]  ONE TOUCH LANCETS MISC Use to test blood sugar once daily 08/23/16   Elayne Snare, MD  pantoprazole (PROTONIX) 40 MG tablet TAKE 1 TABLET BY MOUTH EVERY DAY 01/11/18   Jinny Sanders, MD    Family History Family History  Problem Relation Age of Onset  . Hypertension Mother   . Heart disease Father 89  . Hyperlipidemia Father   . Colonic polyp Father   . Diabetes Father   . Diabetes Maternal Grandmother   . Heart disease Maternal Grandmother   . Stroke Maternal Grandfather   . Colon cancer Neg Hx     Social History  Social History   Tobacco Use  . Smoking status: Never Smoker  . Smokeless tobacco: Never Used  Substance Use Topics  . Alcohol use: Yes    Alcohol/week: 0.0 oz    Comment: occ  . Drug use: No     Allergies   Patient has no known allergies.   Review of Systems Review of Systems  All other systems reviewed and are negative.   Physical Exam Updated Vital Signs BP 128/84 (BP Location: Right Arm)   Pulse 78   Temp 98 F (36.7 C) (Oral)   Resp 18   SpO2 96%   Physical Exam  Constitutional: He is oriented to person, place, and time. He appears well-developed and well-nourished.  HENT:  Head: Normocephalic and  atraumatic.  Eyes: Pupils are equal, round, and reactive to light. Conjunctivae are normal. Right eye exhibits no discharge. Left eye exhibits no discharge. No scleral icterus.  Neck: Normal range of motion. No JVD present. No tracheal deviation present.  Pulmonary/Chest: Effort normal. No stridor.  Abdominal: Soft. He exhibits no distension and no mass. There is no tenderness. There is no rebound and no guarding. No hernia.  Musculoskeletal:  Tenderness palpation of right upper lumbar musculature  Neurological: He is alert and oriented to person, place, and time. Coordination normal.  Psychiatric: He has a normal mood and affect. His behavior is normal. Judgment and thought content normal.  Nursing note and vitals reviewed.    ED Treatments / Results  Labs (all labs ordered are listed, but only abnormal results are displayed) Labs Reviewed  COMPREHENSIVE METABOLIC PANEL - Abnormal; Notable for the following components:      Result Value   BUN 5 (*)    Total Bilirubin 1.6 (*)    All other components within normal limits  URINALYSIS, ROUTINE W REFLEX MICROSCOPIC - Abnormal; Notable for the following components:   APPearance HAZY (*)    Hgb urine dipstick LARGE (*)    Ketones, ur 15 (*)    Protein, ur 30 (*)    All other components within normal limits  URINALYSIS, MICROSCOPIC (REFLEX) - Abnormal; Notable for the following components:   Bacteria, UA RARE (*)    All other components within normal limits  URINE CULTURE  CBC WITH DIFFERENTIAL/PLATELET    EKG None  Radiology Ct Renal Stone Study  Result Date: 01/31/2018 CLINICAL DATA:  Right flank pain.  History of stone disease. EXAM: CT ABDOMEN AND PELVIS WITHOUT CONTRAST TECHNIQUE: Multidetector CT imaging of the abdomen and pelvis was performed following the standard protocol without IV contrast. COMPARISON:  02/26/2017 FINDINGS: Lower chest: Normal Hepatobiliary: Normal Pancreas: Normal Spleen: Normal Adrenals/Urinary Tract:  Adrenal glands are normal. Nonobstructing small stones in the right kidney, 4 in number, none larger than 4 mm. No passing stone or hydroureteronephrosis. No stone in the bladder. Left kidney again shows a 5 cm cyst in the lateral midportion with a smaller cyst above that. No evidence of stone disease or hydronephrosis on the left. Stomach/Bowel: Normal.  Normal appendix.  No acute bowel finding. Vascular/Lymphatic: Normal Reproductive: Normal Other: No free fluid or air. Musculoskeletal: Normal IMPRESSION: No acute finding to explain the clinical presentation. The patient does again show 4 small nonobstructing stones in the right kidney, the largest 4 mm in diameter. No evidence of passing stone. Electronically Signed   By: Nelson Chimes M.D.   On: 01/31/2018 15:08    Procedures Procedures (including critical care time)  Medications Ordered in ED Medications  diazepam (  VALIUM) tablet 2 mg (has no administration in time range)  morphine 4 MG/ML injection 4 mg (4 mg Intravenous Given 01/31/18 1525)  ketorolac (TORADOL) 30 MG/ML injection 30 mg (30 mg Intravenous Given 01/31/18 1602)     Initial Impression / Assessment and Plan / ED Course  I have reviewed the triage vital signs and the nursing notes.  Pertinent labs & imaging results that were available during my care of the patient were reviewed by me and considered in my medical decision making (see chart for details).     Labs: urinalysis, urine culture, cbc, cmp  Imaging: ct renal   Consults:  Therapeutics:  Discharge Meds:   Assessment/Plan: 47 year old male presents today with right-sided flank pain. Patient does have tenderness along the back, high suspicion for muscular etiology. CT reassuring, labs reassuring. No obvious acute intra-abdominal pathology, although patient does have large hemoglobin in his urine which could indicate recently passed stone. I have very low suspicion for acute renal infarct.Patient was given Valium  which nearly completely resolved his symptoms. Patient will follow-up with his primary care for reevaluation of hematuria, if any new or worsening signs or symptoms present patient will return for repeat evaluation. Patient verbalized understanding and agreement to today's plan had no further questions or concerns.    Final Clinical Impressions(s) / ED Diagnoses   Final diagnoses:  Flank pain  Hematuria, unspecified type    ED Discharge Orders        Ordered    diazepam (VALIUM) 2 MG tablet  2 times daily     01/31/18 1734       Okey Regal, PA-C 01/31/18 1735    Charlesetta Shanks, MD 02/06/18 1445

## 2018-01-31 NOTE — Discharge Instructions (Addendum)
Please read attached information. If you experience any new or worsening signs or symptoms please return to the emergency room for evaluation. Please follow-up with your primary care provider or specialist as discussed. Please use medication prescribed only as directed and discontinue taking if you have any concerning signs or symptoms.   °

## 2018-02-01 ENCOUNTER — Emergency Department (HOSPITAL_COMMUNITY)
Admission: EM | Admit: 2018-02-01 | Discharge: 2018-02-01 | Disposition: A | Discharge status: Home / Self Care | Payer: Federal, State, Local not specified - PPO | Attending: Emergency Medicine | Admitting: Emergency Medicine

## 2018-02-01 ENCOUNTER — Emergency Department (HOSPITAL_COMMUNITY): Payer: Federal, State, Local not specified - PPO

## 2018-02-01 ENCOUNTER — Ambulatory Visit: Payer: Self-pay | Admitting: Family Medicine

## 2018-02-01 ENCOUNTER — Other Ambulatory Visit: Payer: Self-pay

## 2018-02-01 ENCOUNTER — Encounter (HOSPITAL_COMMUNITY): Payer: Self-pay

## 2018-02-01 ENCOUNTER — Telehealth: Payer: Self-pay | Admitting: Family Medicine

## 2018-02-01 DIAGNOSIS — R1084 Generalized abdominal pain: Secondary | ICD-10-CM | POA: Insufficient documentation

## 2018-02-01 DIAGNOSIS — Z79899 Other long term (current) drug therapy: Secondary | ICD-10-CM | POA: Insufficient documentation

## 2018-02-01 DIAGNOSIS — N201 Calculus of ureter: Secondary | ICD-10-CM

## 2018-02-01 DIAGNOSIS — N1330 Unspecified hydronephrosis: Secondary | ICD-10-CM

## 2018-02-01 DIAGNOSIS — Z7984 Long term (current) use of oral hypoglycemic drugs: Secondary | ICD-10-CM | POA: Insufficient documentation

## 2018-02-01 DIAGNOSIS — I10 Essential (primary) hypertension: Secondary | ICD-10-CM | POA: Insufficient documentation

## 2018-02-01 DIAGNOSIS — N132 Hydronephrosis with renal and ureteral calculous obstruction: Secondary | ICD-10-CM | POA: Diagnosis not present

## 2018-02-01 DIAGNOSIS — N200 Calculus of kidney: Secondary | ICD-10-CM | POA: Diagnosis not present

## 2018-02-01 DIAGNOSIS — E119 Type 2 diabetes mellitus without complications: Secondary | ICD-10-CM | POA: Diagnosis not present

## 2018-02-01 DIAGNOSIS — R109 Unspecified abdominal pain: Secondary | ICD-10-CM

## 2018-02-01 LAB — COMPREHENSIVE METABOLIC PANEL
ALT: 24 U/L (ref 0–44)
ANION GAP: 9 (ref 5–15)
AST: 31 U/L (ref 15–41)
Albumin: 3.8 g/dL (ref 3.5–5.0)
Alkaline Phosphatase: 59 U/L (ref 38–126)
BUN: 12 mg/dL (ref 6–20)
CHLORIDE: 105 mmol/L (ref 98–111)
CO2: 26 mmol/L (ref 22–32)
Calcium: 8.9 mg/dL (ref 8.9–10.3)
Creatinine, Ser: 1.38 mg/dL — ABNORMAL HIGH (ref 0.61–1.24)
GFR calc Af Amer: >60 mL/min (ref >60)
GFR, EST NON AFRICAN AMERICAN: 59 mL/min — AB (ref >60)
Glucose, Bld: 127 mg/dL — ABNORMAL HIGH (ref 70–99)
POTASSIUM: 4 mmol/L (ref 3.5–5.1)
Sodium: 140 mmol/L (ref 135–145)
TOTAL PROTEIN: 6.8 g/dL (ref 6.5–8.1)
Total Bilirubin: 1.8 mg/dL — ABNORMAL HIGH (ref 0.3–1.2)

## 2018-02-01 LAB — URINALYSIS, ROUTINE W REFLEX MICROSCOPIC
BILIRUBIN URINE: NEGATIVE
Glucose, UA: NEGATIVE mg/dL
HGB URINE DIPSTICK: NEGATIVE
KETONES UR: NEGATIVE mg/dL
Leukocytes, UA: NEGATIVE
NITRITE: NEGATIVE
Protein, ur: NEGATIVE mg/dL
SPECIFIC GRAVITY, URINE: 1.019 (ref 1.005–1.030)
pH: 6 (ref 5.0–8.0)

## 2018-02-01 LAB — I-STAT CHEM 8, ED
BUN: 12 mg/dL (ref 6–20)
CALCIUM ION: 1.16 mmol/L (ref 1.15–1.40)
CHLORIDE: 102 mmol/L (ref 98–111)
Creatinine, Ser: 1.5 mg/dL — ABNORMAL HIGH (ref 0.61–1.24)
GLUCOSE: 121 mg/dL — AB (ref 70–99)
HCT: 42 % (ref 39.0–52.0)
Hemoglobin: 14.3 g/dL (ref 13.0–17.0)
Potassium: 3.8 mmol/L (ref 3.5–5.1)
Sodium: 142 mmol/L (ref 135–145)
TCO2: 25 mmol/L (ref 22–32)

## 2018-02-01 LAB — CBC WITH DIFFERENTIAL/PLATELET
Abs Immature Granulocytes: 0 10*3/uL (ref 0.0–0.1)
BASOS PCT: 0 %
Basophils Absolute: 0 10*3/uL (ref 0.0–0.1)
EOS ABS: 0.3 10*3/uL (ref 0.0–0.7)
EOS PCT: 2 %
HEMATOCRIT: 39.5 % (ref 39.0–52.0)
Hemoglobin: 12.9 g/dL — ABNORMAL LOW (ref 13.0–17.0)
Immature Granulocytes: 0 %
LYMPHS ABS: 2.3 10*3/uL (ref 0.7–4.0)
Lymphocytes Relative: 21 %
MCH: 28.9 pg (ref 26.0–34.0)
MCHC: 32.7 g/dL (ref 30.0–36.0)
MCV: 88.6 fL (ref 78.0–100.0)
Monocytes Absolute: 1.4 10*3/uL — ABNORMAL HIGH (ref 0.1–1.0)
Monocytes Relative: 13 %
NEUTROS PCT: 64 %
Neutro Abs: 6.8 10*3/uL (ref 1.7–7.7)
PLATELETS: 236 10*3/uL (ref 150–400)
RBC: 4.46 MIL/uL (ref 4.22–5.81)
RDW: 12.2 % (ref 11.5–15.5)
WBC: 10.8 10*3/uL — ABNORMAL HIGH (ref 4.0–10.5)

## 2018-02-01 LAB — URINE CULTURE: CULTURE: NO GROWTH

## 2018-02-01 LAB — LIPASE, BLOOD: LIPASE: 40 U/L (ref 11–51)

## 2018-02-01 MED ORDER — ONDANSETRON HCL 4 MG/2ML IJ SOLN
4.0000 mg | Freq: Once | INTRAMUSCULAR | Status: AC
  Administered 2018-02-01: 4 mg via INTRAVENOUS
  Filled 2018-02-01: qty 2

## 2018-02-01 MED ORDER — KETOROLAC TROMETHAMINE 30 MG/ML IJ SOLN: 30.0000 mg | Freq: Once | INTRAMUSCULAR | Status: DC

## 2018-02-01 MED ORDER — HYDROMORPHONE HCL 1 MG/ML IJ SOLN
1.0000 mg | Freq: Once | INTRAMUSCULAR | Status: AC
  Administered 2018-02-01: 1 mg via INTRAVENOUS
  Filled 2018-02-01: qty 1

## 2018-02-01 MED ORDER — TAMSULOSIN HCL 0.4 MG PO CAPS: 0.4000 mg | ORAL_CAPSULE | Freq: Every day | ORAL | 0 refills | Status: DC

## 2018-02-01 MED ORDER — HYDROMORPHONE HCL 1 MG/ML IJ SOLN
0.5000 mg | Freq: Once | INTRAMUSCULAR | Status: AC
  Administered 2018-02-01: 0.5 mg via INTRAVENOUS
  Filled 2018-02-01: qty 1

## 2018-02-01 MED ORDER — OXYCODONE-ACETAMINOPHEN 5-325 MG PO TABS
1.0000 | ORAL_TABLET | Freq: Once | ORAL | Status: AC
  Administered 2018-02-01: 1 via ORAL
  Filled 2018-02-01: qty 1

## 2018-02-01 MED ORDER — KETOROLAC TROMETHAMINE 30 MG/ML IJ SOLN
30.0000 mg | Freq: Once | INTRAMUSCULAR | Status: AC
Start: 2018-02-01 — End: 2018-02-01
  Administered 2018-02-01: 30 mg via INTRAMUSCULAR
  Filled 2018-02-01: qty 1

## 2018-02-01 MED ORDER — ONDANSETRON 4 MG PO TBDP: ORAL_TABLET | ORAL | 0 refills | Status: DC

## 2018-02-01 MED ORDER — IOHEXOL 300 MG/ML  SOLN
100.0000 mL | Freq: Once | INTRAMUSCULAR | Status: AC | PRN
  Administered 2018-02-01: 100 mL via INTRAVENOUS

## 2018-02-01 MED ORDER — TAMSULOSIN HCL 0.4 MG PO CAPS
0.4000 mg | ORAL_CAPSULE | Freq: Once | ORAL | Status: AC
  Administered 2018-02-01: 0.4 mg via ORAL
  Filled 2018-02-01: qty 1

## 2018-02-01 MED ORDER — OXYCODONE-ACETAMINOPHEN 5-325 MG PO TABS: 1.0000 | ORAL_TABLET | ORAL | 0 refills | Status: DC | PRN

## 2018-02-01 MED ORDER — SODIUM CHLORIDE 0.9 % IV BOLUS
1000.0000 mL | Freq: Once | INTRAVENOUS | Status: AC
  Administered 2018-02-01: 1000 mL via INTRAVENOUS

## 2018-02-01 NOTE — Telephone Encounter (Signed)
Can I schedule pt with dr copland tomorrow in one of same days  Copied from [place] [phone]. Topic: Appointment Scheduling - Scheduling Inquiry for Clinic >> Feb 01, 2018  8:45 AM Scherrie Gerlach wrote: Reason for CRM:  pt went to the ED yesterday a for back spasms. Pt requesting to see a dr asap.  Dr Diona Browner is out until next week.  No appts except same day appt. Please advise if ok to schedule.

## 2018-02-01 NOTE — Discharge Instructions (Addendum)
Your CT scan shows a kidney stone causing obstruction on the right side this can easily explain your pain.  Please use 1-2 Percocet every 4-6 hours as needed for pain, you may use Zofran for nausea, you may use ibuprofen in addition to these medications.  You can also use Flomax daily to help ease the passage of the stone.  Please strain your urine to you can collect any passed stones.  You will need to follow-up with urology call and schedule an appointment.  Return to the emergency department for significantly worsening pain, fevers, if you are unable to urinate or any other new or concerning symptoms.

## 2018-02-01 NOTE — ED Provider Notes (Signed)
Care assumed from [place] at shift change, please see his note for full details, but in brief SEICHI KAUFHOLD is a 47 y.o. male who presents for persistent right flank pain, was seen yesterday and had work-up for kidney stone, and had CT renal stone study which showed 4 small intrarenal stones in the right kidney but no evidence of obstructive uropathy or hydronephrosis.  Patient was sent home with ibuprofen and Valium, but reports pain has persisted and gotten worse despite these medications.  Pain is a constant dull ache with intermittent sharp pain.  He reports 2 episodes of emesis, and some nausea.  Denies fevers or chills, no urinary symptoms today.  Initially attempted pain management with Toradol and Percocet, but remained very uncomfortable on reevaluation  Care signed out to me pending i-STAT Chem-8 to recheck kidney function and urinalysis, if normal and pain well controlled patient can go home with continued symptomatic management.  Labs Reviewed  CBC WITH DIFFERENTIAL/PLATELET - Abnormal; Notable for the following components:      Result Value   WBC 10.8 (*)    Hemoglobin 12.9 (*)    Monocytes Absolute 1.4 (*)    All other components within normal limits  COMPREHENSIVE METABOLIC PANEL - Abnormal; Notable for the following components:   Glucose, Bld 127 (*)    Creatinine, Ser 1.38 (*)    Total Bilirubin 1.8 (*)    GFR calc non Af Amer 59 (*)    All other components within normal limits  I-STAT CHEM 8, ED - Abnormal; Notable for the following components:   Creatinine, Ser 1.50 (*)    Glucose, Bld 121 (*)    All other components within normal limits  URINALYSIS, ROUTINE W REFLEX MICROSCOPIC  LIPASE, BLOOD   Creatinine is 1.50 today, was 0.75 yesterday, this acute increase in kidney function is concerning given no evidence of obstructive uropathy or hydronephrosis yesterday.  Awaiting urinalysis will also get repeat CBC, CMP and lipase to look for any new leukocytosis or any  other change in abdominal labs.  Will get CT abdomen pelvis with contrast to look for any evidence of infection or infarction in the right kidney.  IV fluids and continue pain management.  Ct Abdomen Pelvis W Contrast  Result Date: 02/01/2018 CLINICAL DATA:  Worsening flank pain. EXAM: CT ABDOMEN AND PELVIS WITH CONTRAST TECHNIQUE: Multidetector CT imaging of the abdomen and pelvis was performed using the standard protocol following bolus administration of intravenous contrast. CONTRAST:  134mL OMNIPAQUE IOHEXOL 300 MG/ML  SOLN COMPARISON:  01/31/2018. FINDINGS: Lower chest: Lower lobe atelectasis bilaterally. Hepatobiliary: No focal abnormality within the liver parenchyma. There is no evidence for gallstones, gallbladder wall thickening, or pericholecystic fluid. No intrahepatic or extrahepatic biliary dilation. Pancreas: No focal mass lesion. No dilatation of the main duct. No intraparenchymal cyst. No peripancreatic edema. Spleen: No splenomegaly. No focal mass lesion. Adrenals/Urinary Tract: No adrenal nodule or mass. Tiny nonobstructing stones again noted right kidney with mild fullness of the right intrarenal collecting system and right ureter, new in the interval. Mild periureteric edema is also new. The secondary changes in the right kidney and ureter are due to the presence of a 3 mm stone in the distal right ureter near the UVJ. There is mildly decreased perfusion to the right kidney compatible with obstructive uropathy. Similar appearance of cysts in the left kidney measuring up to 5 cm. No evidence for left renal or ureteral stones. No evidence for stones in the bladder lumen. Stomach/Bowel:  Stomach is nondistended. No gastric wall thickening. No evidence of outlet obstruction. Duodenum is normally positioned as is the ligament of Treitz. No small bowel wall thickening. No small bowel dilatation. The terminal ileum is normal. The appendix is normal. No gross colonic mass. No colonic wall thickening.  No substantial diverticular change. Vascular/Lymphatic: No abdominal aortic aneurysm. There is no gastrohepatic or hepatoduodenal ligament lymphadenopathy. No intraperitoneal or retroperitoneal lymphadenopathy. Upper normal to mildly enlarged mesenteric lymph nodes are stable since prior study compatible with benign etiology. No pelvic sidewall lymphadenopathy. Reproductive: The prostate gland and seminal vesicles have normal imaging features. Other: No intraperitoneal free fluid. Musculoskeletal: No worrisome lytic or sclerotic osseous abnormality. IMPRESSION: 1. Since yesterday's study, the patient has developed right mild hydroureteronephrosis with decreased perfusion of the right kidney consistent with obstructive uropathy. These changes are due to the presence of a 3 mm distal right ureteral stone visible near the UVJ. 2. Additional nonobstructing right renal stones. 3. Left renal cysts. Electronically Signed   By: Misty Stanley M.D.   On: 02/01/2018 20:03     CT shows 3 mm stone lodged at the right UVJ with mild hydronephrosis.  There are additional nonobstructing stones noted in the right kidney and left renal cysts as seen before.  This most certainly explains patient's pain, discussed these results with him labs are otherwise unremarkable, mild leukocytosis of 10.8, no signs of infection.  Creatinine on CMP is 1.38.  Patient pain has been managed well here in the ED with IV pain medication.  Will send patient home with urine strainer, Percocet, Flomax and Zofran and patient will follow-up with urology.  Return precautions discussed.  Patient is in agreement with this plan.   Janet Berlin 02/01/18 2055    Merrily Pew, MD 02/02/18 [phone]

## 2018-02-01 NOTE — ED Notes (Signed)
Pt given discharge instructions with prescriptions. Pt informed about taking percocet with additional tylenol. Pt verbalized understanding. Pt ambulated to lobby without complication.

## 2018-02-01 NOTE — ED Triage Notes (Signed)
Pt states that he was seen here for kidney stones yesterday. States that he has been taking valium and ibuprofen at home today with increasing pain.

## 2018-02-01 NOTE — ED Provider Notes (Signed)
[facility] EMERGENCY DEPARTMENT Provider Note   CSN: [id number] Arrival date & time: 02/01/18  1552     History   Chief Complaint Chief Complaint  Patient presents with  . Flank Pain    HPI Paul Flowers is a 47 y.o. male.  Patient with h/o DM known intrarenal stones presents with continued intermittent waxing and waning right flank pain.  Pain is better in certain positions but is not reproduced with palpation.  Patient had a renal CT yesterday which showed 4 intrarenal stones.  He did have a UA demonstrating area.  Patient states that he did have some improvement with Valium and ibuprofen prescribed yesterday however at approximately 2:30 PM today, patient became severe again and caused an episode of vomiting.  He denies fever, dysuria.  Pain does not radiate.  No abdominal pain reported.     Past Medical History:  Diagnosis Date  . Allergic rhinitis   . ALLERGIC RHINITIS 01/19/2007  . Diabetes ([place])    diet controlled  . GERD 01/19/2007  . GERD (gastroesophageal reflux disease)   . Hypertension    Slight  . Sleep apnea 01/29/2011    Patient Active Problem List   Diagnosis Date Noted  . Bilateral foot pain 01/13/2018  . IBS (irritable bowel syndrome) 06/15/2016  . High cholesterol 06/11/2015  . 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 Controlled type 2 diabetes mellitus without complication, without long-term current use of insulin ([place]) 09/24/2014  . HTN (hypertension) 11/09/2013  . Routine health maintenance 02/12/2013  . OSA (obstructive sleep apnea) 03/19/2011  . Allergic rhinitis 01/19/2007  . GERD 01/19/2007    Past Surgical History:  Procedure Laterality Date  . LEFT HEART CATHETERIZATION WITH CORONARY ANGIOGRAM N/A 12/11/2013   Procedure: LEFT HEART CATHETERIZATION WITH CORONARY ANGIOGRAM;  Surgeon: Josue Hector, MD;  Location: [facility] CATH LAB;  Service: Cardiovascular;  Laterality: N/A;        Home Medications    Prior to  Admission medications   Medication Sig Start Date End Date Taking? Authorizing Provider  atorvastatin (LIPITOR) 20 MG tablet TAKE 1 TABLET BY MOUTH EVERY DAY 11/10/17   Bedsole, Amy E, MD  diazepam (VALIUM) 2 MG tablet Take 1 tablet (2 mg total) by mouth 2 (two) times daily. 01/31/18   Hedges, Dellis Filbert, PA-C  glucose blood (ONETOUCH VERIO) test strip Use to test blood sugar once daily 08/23/16   Elayne Snare, MD  JANUMET XR [id number] MG TB24 TAKE 1 TABLET BY MOUTH DAILY 11/12/17   Elayne Snare, MD  losartan (COZAAR) 25 MG tablet TAKE 1 TABLET BY MOUTH EVERY DAY 01/20/18   Bedsole, Amy E, MD  mometasone (NASONEX) 50 MCG/ACT nasal spray PLACE 2 SPRAYS INTO THE NOSE DAILY. 12/21/17   Bedsole, Amy E, MD  montelukast (SINGULAIR) 10 MG tablet TAKE 1 TABLET BY MOUTH EVERYDAY AT BEDTIME 01/11/18   Bedsole, Amy E, MD  Multiple Vitamin (MULTIVITAMIN WITH MINERALS) TABS tablet Take 1 tablet by mouth daily.    [provider]  ONE TOUCH LANCETS MISC Use to test blood sugar once daily 08/23/16   Elayne Snare, MD  pantoprazole (PROTONIX) 40 MG tablet TAKE 1 TABLET BY MOUTH EVERY DAY 01/11/18   Jinny Sanders, MD    Family History Family History  Problem Relation Age of Onset  . Hypertension Mother   . Heart disease Father 67  . Hyperlipidemia Father   . Colonic polyp Father   . Diabetes Father   . Diabetes  Maternal Grandmother   . Heart disease Maternal Grandmother   . Stroke Maternal Grandfather   . Colon cancer Neg Hx     Social History Social History   Tobacco Use  . Smoking status: Never Smoker  . Smokeless tobacco: Never Used  Substance Use Topics  . Alcohol use: Yes    Alcohol/week: 0.0 oz    Comment: occ  . Drug use: No     Allergies   Patient has no known allergies.   Review of Systems Review of Systems  Constitutional: Negative for fever.  HENT: Negative for rhinorrhea and sore throat.   Eyes: Negative for redness.  Respiratory: Negative for cough.   Cardiovascular: Negative  for chest pain.  Gastrointestinal: Negative for abdominal pain, diarrhea, nausea and vomiting.  Genitourinary: Positive for flank pain and hematuria. Negative for dysuria.  Musculoskeletal: Negative for myalgias.  Skin: Negative for rash.  Neurological: Negative for headaches.     Physical Exam Updated Vital Signs BP (!) 153/84 (BP Location: Right Arm)   Pulse 96   Temp 98.8 F (37.1 C) (Oral)   Resp 17   Ht 5\' 9"  (1.753 m)   Wt 83 kg (183 lb)   SpO2 98%   BMI 27.02 kg/m   Physical Exam  Constitutional: He appears well-developed and well-nourished.  HENT:  Head: Normocephalic and atraumatic.  Mouth/Throat: Oropharynx is clear and moist.  Eyes: Conjunctivae are normal. Right eye exhibits no discharge. Left eye exhibits no discharge.  Neck: Normal range of motion. Neck supple.  Cardiovascular: Normal rate, regular rhythm and normal heart sounds.  Pulmonary/Chest: Effort normal and breath sounds normal. No respiratory distress. He has no wheezes. He has no rales.  Abdominal: Soft. There is no tenderness. There is no rebound and no guarding.  Musculoskeletal:       Cervical back: He exhibits normal range of motion, no tenderness and no bony tenderness.       Thoracic back: He exhibits normal range of motion, no tenderness and no bony tenderness.       Lumbar back: He exhibits no tenderness and no bony tenderness.  Neurological: He is alert.  Skin: Skin is warm and dry.  Psychiatric: He has a normal mood and affect.  Nursing note and vitals reviewed.    ED Treatments / Results  Labs (all labs ordered are listed, but only abnormal results are displayed) Labs Reviewed  URINALYSIS, ROUTINE W REFLEX MICROSCOPIC  I-STAT CHEM 8, ED    EKG None  Radiology Ct Renal Stone Study  Result Date: 01/31/2018 CLINICAL DATA:  Right flank pain.  History of stone disease. EXAM: CT ABDOMEN AND PELVIS WITHOUT CONTRAST TECHNIQUE: Multidetector CT imaging of the abdomen and pelvis was  performed following the standard protocol without IV contrast. COMPARISON:  02/26/2017 FINDINGS: Lower chest: Normal Hepatobiliary: Normal Pancreas: Normal Spleen: Normal Adrenals/Urinary Tract: Adrenal glands are normal. Nonobstructing small stones in the right kidney, 4 in number, none larger than 4 mm. No passing stone or hydroureteronephrosis. No stone in the bladder. Left kidney again shows a 5 cm cyst in the lateral midportion with a smaller cyst above that. No evidence of stone disease or hydronephrosis on the left. Stomach/Bowel: Normal.  Normal appendix.  No acute bowel finding. Vascular/Lymphatic: Normal Reproductive: Normal Other: No free fluid or air. Musculoskeletal: Normal IMPRESSION: No acute finding to explain the clinical presentation. The patient does again show 4 small nonobstructing stones in the right kidney, the largest 4 mm in diameter. No evidence of  passing stone. Electronically Signed   By: Nelson Chimes M.D.   On: 01/31/2018 15:08    Procedures Procedures (including critical care time)  Medications Ordered in ED Medications  HYDROmorphone (DILAUDID) injection 0.5 mg (has no administration in time range)  ondansetron (ZOFRAN) injection 4 mg (has no administration in time range)  oxyCODONE-acetaminophen (PERCOCET/ROXICET) 5-325 MG per tablet 1 tablet (1 tablet Oral Given 02/01/18 1631)  ketorolac (TORADOL) 30 MG/ML injection 30 mg (30 mg Intramuscular Given 02/01/18 1631)     Initial Impression / Assessment and Plan / ED Course  I have reviewed the triage vital signs and the nursing notes.  Pertinent labs & imaging results that were available during my care of the patient were reviewed by me and considered in my medical decision making (see chart for details).     Patient seen and examined. Medications ordered. Reviewed work-up from yesterday. Pain pattern and quality suspicious for ureteral colic. Yesterday's study was without contrast and patient appears comfortable  enough today that I would not pursue contrasted imaging to look for renal infarct unless pain is more constant or uncontrolled. He will be given toradol, PO Percocet. Discussed not to use Valium with opioid. Will also be given a strainer and urology follow-up.   Vital signs reviewed and are as follows: BP (!) 153/84 (BP Location: Right Arm)   Pulse 96   Temp 98.8 F (37.1 C) (Oral)   Resp 17   Ht 5\' 9"  (1.753 m)   Wt 83 kg (183 lb)   SpO2 98%   BMI 27.02 kg/m   5:03 PM Handoff of Ford PA-C at shift change.   Pain has worsened.  Patient appears very uncomfortable, pacing in the room.  IV pain medication ordered.  Will recheck UA and creatinine.    Final Clinical Impressions(s) / ED Diagnoses   Final diagnoses:  Flank pain    ED Discharge Orders    None       Carlisle Cater, PA-C 02/01/18 1704    Mesner, Corene Cornea, MD 02/02/18 0009

## 2018-02-01 NOTE — Telephone Encounter (Signed)
I think that will be fine.

## 2018-02-01 NOTE — Telephone Encounter (Signed)
Appointment 8/1 Pt aware

## 2018-02-01 NOTE — Telephone Encounter (Signed)
Mother, Paul Flowers called in for son c/o him having very severe pain in the right kidney area in the back. He had this same pain yesterday and went to the ED at [facility].   He was told he has 4 kidney stones.   Was given pain medicine at the hospital that helped.   She was asking if he could be seen by Dr. Diona Browner in the office.   I let her know why she needed to take him to the ED.   She was agreeable to take him back to [facility] ED now so he could get some pain relief.   I explained that if he was passing a kidney stone that it is a very painful thing and he needs pain medication that we don't have in the office.  He already has a f/u appt 02/02/18 with Dr. Lorelei Pont at 12:00 for hospital f/u from ED visit yesterday.  (7/30)    I routed a note to Dr. Rometta Emery office making her aware.   Reason for Disposition . [1] SEVERE pain (e.g., excruciating, scale 8-10) AND [2] present > 1 hour    Was seen in the ED yesterday for same pain and was told he has 4 kidney stones.  Answer Assessment - Initial Assessment Questions 1. LOCATION: "Where does it hurt?" (e.g., left, right)     Right kidney area in the back 2. ONSET: "When did the pain start?"     Severe pain started yesterday 1:00PM.   Went to ED.   Gave him Fentanyl which helped the pain.   He has a kidney stone. Has 4 stones. 3. SEVERITY: "How bad is the pain?" (e.g., Scale 1-10; mild, moderate, or severe)   - MILD (1-3): doesn't interfere with normal activities    - MODERATE (4-7): interferes with normal activities or awakens from sleep    - SEVERE (8-10): excruciating pain and patient unable to do normal activities (stays in bed)       10 on pain scale.  4. PATTERN: "Does the pain come and go, or is it constant?"      Off and on severe pain in right kidney area. 5. CAUSE: "What do you think is causing the pain?"     Questionable passing a kidney stone. 6. OTHER SYMPTOMS:  "Do you have any other symptoms?" (e.g., fever, abdominal pain, vomiting, leg  weakness, burning with urination, blood in urine)     Gave Valium for the spasms.    They gave him Toradol.    7. PREGNANCY:  "Is there any chance you are pregnant?" "When was your last menstrual period?"     N/A  Protocols used: FLANK PAIN-A-AH

## 2018-02-01 NOTE — ED Notes (Signed)
Pt returned from CT °

## 2018-02-02 ENCOUNTER — Ambulatory Visit (HOSPITAL_COMMUNITY): Payer: Federal, State, Local not specified - PPO | Admitting: Certified Registered Nurse Anesthetist

## 2018-02-02 ENCOUNTER — Ambulatory Visit (HOSPITAL_COMMUNITY): Payer: Federal, State, Local not specified - PPO

## 2018-02-02 ENCOUNTER — Ambulatory Visit: Payer: Federal, State, Local not specified - PPO | Admitting: Family Medicine

## 2018-02-02 ENCOUNTER — Telehealth: Payer: Self-pay | Admitting: Family Medicine

## 2018-02-02 ENCOUNTER — Other Ambulatory Visit: Payer: Self-pay | Admitting: Family Medicine

## 2018-02-02 ENCOUNTER — Other Ambulatory Visit: Payer: Self-pay | Admitting: Urology

## 2018-02-02 ENCOUNTER — Encounter (HOSPITAL_COMMUNITY): Payer: Self-pay | Admitting: *Deleted

## 2018-02-02 ENCOUNTER — Ambulatory Visit (HOSPITAL_COMMUNITY)
Admission: AD | Admit: 2018-02-02 | Discharge: 2018-02-02 | Disposition: A | Discharge status: Home / Self Care | Payer: Federal, State, Local not specified - PPO | Source: Ambulatory Visit | Attending: Urology | Admitting: Urology

## 2018-02-02 ENCOUNTER — Encounter (HOSPITAL_COMMUNITY)
Admission: AD | Disposition: A | Discharge status: Home / Self Care | Payer: Self-pay | Source: Ambulatory Visit | Attending: Urology

## 2018-02-02 DIAGNOSIS — E119 Type 2 diabetes mellitus without complications: Secondary | ICD-10-CM | POA: Diagnosis not present

## 2018-02-02 DIAGNOSIS — Z87442 Personal history of urinary calculi: Secondary | ICD-10-CM | POA: Insufficient documentation

## 2018-02-02 DIAGNOSIS — Z8371 Family history of colonic polyps: Secondary | ICD-10-CM | POA: Insufficient documentation

## 2018-02-02 DIAGNOSIS — Z8249 Family history of ischemic heart disease and other diseases of the circulatory system: Secondary | ICD-10-CM | POA: Diagnosis not present

## 2018-02-02 DIAGNOSIS — G4733 Obstructive sleep apnea (adult) (pediatric): Secondary | ICD-10-CM | POA: Diagnosis not present

## 2018-02-02 DIAGNOSIS — Z9889 Other specified postprocedural states: Secondary | ICD-10-CM | POA: Diagnosis not present

## 2018-02-02 DIAGNOSIS — N132 Hydronephrosis with renal and ureteral calculous obstruction: Secondary | ICD-10-CM | POA: Insufficient documentation

## 2018-02-02 DIAGNOSIS — N201 Calculus of ureter: Secondary | ICD-10-CM | POA: Diagnosis not present

## 2018-02-02 DIAGNOSIS — I10 Essential (primary) hypertension: Secondary | ICD-10-CM | POA: Insufficient documentation

## 2018-02-02 DIAGNOSIS — K219 Gastro-esophageal reflux disease without esophagitis: Secondary | ICD-10-CM | POA: Diagnosis not present

## 2018-02-02 HISTORY — PX: CYSTOSCOPY/URETEROSCOPY/HOLMIUM LASER/STENT PLACEMENT: SHX6546

## 2018-02-02 LAB — GLUCOSE, CAPILLARY
Glucose-Capillary: 128 mg/dL — ABNORMAL HIGH (ref 70–99)
Glucose-Capillary: 107 mg/dL — ABNORMAL HIGH (ref 70–99)

## 2018-02-02 SURGERY — CYSTOSCOPY/URETEROSCOPY/HOLMIUM LASER/STENT PLACEMENT
Anesthesia: General | Site: Ureter | Laterality: Right

## 2018-02-02 MED ORDER — ONDANSETRON HCL 4 MG/2ML IJ SOLN: 4.0000 mg | Freq: Four times a day (QID) | INTRAMUSCULAR | Status: DC | PRN

## 2018-02-02 MED ORDER — ONDANSETRON HCL 4 MG/2ML IJ SOLN
INTRAMUSCULAR | Status: AC
  Filled 2018-02-02: qty 2

## 2018-02-02 MED ORDER — PROPOFOL 10 MG/ML IV BOLUS
INTRAVENOUS | Status: AC
  Filled 2018-02-02: qty 20

## 2018-02-02 MED ORDER — LACTATED RINGERS IV SOLN
INTRAVENOUS | Status: DC
  Administered 2018-02-02: 17:00:00 via INTRAVENOUS

## 2018-02-02 MED ORDER — LIDOCAINE 2% (20 MG/ML) 5 ML SYRINGE
INTRAMUSCULAR | Status: AC
  Filled 2018-02-02: qty 5

## 2018-02-02 MED ORDER — MIDAZOLAM HCL 2 MG/2ML IJ SOLN
INTRAMUSCULAR | Status: AC
  Filled 2018-02-02: qty 2

## 2018-02-02 MED ORDER — CEFAZOLIN SODIUM-DEXTROSE 2-4 GM/100ML-% IV SOLN
2.0000 g | INTRAVENOUS | Status: AC
Start: 2018-02-02 — End: 2018-02-02
  Administered 2018-02-02: 2 g via INTRAVENOUS

## 2018-02-02 MED ORDER — PROPOFOL 10 MG/ML IV BOLUS
INTRAVENOUS | Status: DC | PRN
  Administered 2018-02-02: 180 mg via INTRAVENOUS
  Administered 2018-02-02: 60 mg via INTRAVENOUS

## 2018-02-02 MED ORDER — ONDANSETRON HCL 4 MG/2ML IJ SOLN
INTRAMUSCULAR | Status: DC | PRN
  Administered 2018-02-02: 4 mg via INTRAVENOUS

## 2018-02-02 MED ORDER — SODIUM CHLORIDE 0.9 % IR SOLN
Status: DC | PRN
  Administered 2018-02-02: 6000 mL

## 2018-02-02 MED ORDER — MONTELUKAST SODIUM 10 MG PO TABS: ORAL_TABLET | ORAL | 3 refills | Status: DC

## 2018-02-02 MED ORDER — OXYCODONE HCL 5 MG PO TABS: 5.0000 mg | ORAL_TABLET | Freq: Once | ORAL | Status: DC | PRN

## 2018-02-02 MED ORDER — OXYCODONE HCL 5 MG/5ML PO SOLN
5.0000 mg | Freq: Once | ORAL | Status: DC | PRN
  Filled 2018-02-02: qty 5

## 2018-02-02 MED ORDER — HYDROMORPHONE HCL 1 MG/ML IJ SOLN: 0.2500 mg | INTRAMUSCULAR | Status: DC | PRN

## 2018-02-02 MED ORDER — TAMSULOSIN HCL 0.4 MG PO CAPS: 0.4000 mg | ORAL_CAPSULE | Freq: Every day | ORAL | 0 refills | Status: DC

## 2018-02-02 MED ORDER — FENTANYL CITRATE (PF) 100 MCG/2ML IJ SOLN
INTRAMUSCULAR | Status: AC
  Filled 2018-02-02: qty 2

## 2018-02-02 MED ORDER — PANTOPRAZOLE SODIUM 40 MG PO TBEC: 40.0000 mg | DELAYED_RELEASE_TABLET | Freq: Every day | ORAL | 3 refills | Status: DC

## 2018-02-02 MED ORDER — HYDROMORPHONE HCL 1 MG/ML IJ SOLN
INTRAMUSCULAR | Status: AC
  Administered 2018-02-02: 1 mg via INTRAVENOUS
  Filled 2018-02-02: qty 1

## 2018-02-02 MED ORDER — MIDAZOLAM HCL 5 MG/5ML IJ SOLN
INTRAMUSCULAR | Status: DC | PRN
  Administered 2018-02-02: 2 mg via INTRAVENOUS

## 2018-02-02 MED ORDER — FENTANYL CITRATE (PF) 100 MCG/2ML IJ SOLN
INTRAMUSCULAR | Status: DC | PRN
  Administered 2018-02-02: 25 ug via INTRAVENOUS
  Administered 2018-02-02: 50 ug via INTRAVENOUS

## 2018-02-02 MED ORDER — OXYCODONE-ACETAMINOPHEN 5-325 MG PO TABS: 1.0000 | ORAL_TABLET | ORAL | 0 refills | Status: DC | PRN

## 2018-02-02 MED ORDER — SODIUM CHLORIDE 0.9 % IV SOLN
INTRAVENOUS | Status: DC | PRN
  Administered 2018-02-02: 5 mL

## 2018-02-02 MED ORDER — HYDROMORPHONE HCL 1 MG/ML IJ SOLN
1.0000 mg | INTRAMUSCULAR | Status: AC
  Administered 2018-02-02: 1 mg via INTRAVENOUS

## 2018-02-02 MED ORDER — CEFAZOLIN SODIUM-DEXTROSE 2-4 GM/100ML-% IV SOLN
INTRAVENOUS | Status: AC
  Filled 2018-02-02: qty 100

## 2018-02-02 MED ORDER — STERILE WATER FOR IRRIGATION IR SOLN
Status: DC | PRN
  Administered 2018-02-02: 500 mL

## 2018-02-02 MED ORDER — LIDOCAINE 2% (20 MG/ML) 5 ML SYRINGE
INTRAMUSCULAR | Status: DC | PRN
  Administered 2018-02-02: 60 mg via INTRAVENOUS

## 2018-02-02 SURGICAL SUPPLY — 20 items
BAG URO CATCHER STRL LF (MISCELLANEOUS) ×2 IMPLANT
CATH INTERMIT  6FR 70CM (CATHETERS) ×2 IMPLANT
CLOTH BEACON ORANGE TIMEOUT ST (SAFETY) ×2 IMPLANT
EXTRACTOR STONE NITINOL NGAGE (UROLOGICAL SUPPLIES) ×1 IMPLANT
FIBER LASER TRAC TIP (UROLOGICAL SUPPLIES) IMPLANT
GLOVE BIO SURGEON STRL SZ8 (GLOVE) ×2 IMPLANT
GLOVE BIOGEL PI IND STRL 7.5 (GLOVE) IMPLANT
GLOVE BIOGEL PI INDICATOR 7.5 (GLOVE) ×1
GLOVE SURG SS PI 7.5 STRL IVOR (GLOVE) ×1 IMPLANT
GOWN STRL REUS W/TWL 2XL LVL3 (GOWN DISPOSABLE) ×1 IMPLANT
GOWN STRL REUS W/TWL XL LVL3 (GOWN DISPOSABLE) ×2 IMPLANT
GUIDEWIRE ANG ZIPWIRE 038X150 (WIRE) ×2 IMPLANT
GUIDEWIRE STR DUAL SENSOR (WIRE) IMPLANT
MANIFOLD NEPTUNE II (INSTRUMENTS) ×2 IMPLANT
PACK CYSTO (CUSTOM PROCEDURE TRAY) ×2 IMPLANT
SHEATH URETERAL 12FRX35CM (MISCELLANEOUS) IMPLANT
STENT CONTOUR 6FRX26X.038 (STENTS) IMPLANT
STENT URET 6FRX26 CONTOUR (STENTS) ×1 IMPLANT
TUBE FEEDING 8FR 16IN STR KANG (MISCELLANEOUS) ×1 IMPLANT
TUBING CONNECTING 10 (TUBING) ×2 IMPLANT

## 2018-02-02 NOTE — Op Note (Signed)
Preoperative diagnosis: Right ureteral stone  Postoperative diagnosis: Same  Procedure: 1 cystoscopy 2 right retrograde pyelography 3.  Intraoperative fluoroscopy, under one hour, with interpretation 4.  Right ureteroscopic stone manipulation with basket extraction 5.  Right 6 x 26 JJ stent placement  Attending: Rosie Fate  Anesthesia: General  Estimated blood loss: None  Drains: Right 6 x 26 JJ ureteral stent without tether  Specimens: stone for analysis  Antibiotics: ancef  Findings: right 10mm distal ureteral calculus. Mild hydronephrosis.  Indications: Patient is a 47 year old male with a history of a right ureteral stone and who has failed medical expulsive therapy.  After discussing treatment options, he decided proceed with right ureteroscopic stone manipulation.  Procedure her in detail: The patient was brought to the operating room and a brief timeout was done to ensure correct patient, correct procedure, correct site.  General anesthesia was administered patient was placed in dorsal lithotomy position.  Her genitalia was then prepped and draped in usual sterile fashion.  A rigid 78 French cystoscope was passed in the urethra and the bladder.  Bladder was inspected free masses or lesions.  the right ureteral orifices were in the normal orthotopic locations.  a 6 french ureteral catheter was then instilled into the right ureter orifice.  a gentle retrograde was obtained and findings noted above.  we then placed a zip wire through the ureteral catheter and advanced up to the renal pelvis.  we then removed the cystoscope and cannulated the right ureteral orifice with a semirigid ureteroscope.  we then encountered the stone in the mid ureter.  using an Ngage basket the stone was removed. we then placed a 6 x 26 double-j ureteral stent over the original zip wire. We then removed the wire and good coil was noted in the the renal pelvis under fluoroscopy and the bladder under direct  vision.     the bladder was then drained and this concluded the procedure which was well tolerated by patient.  Complications: None  Condition: Stable, extubated, transferred to PACU  Plan: Patient is to be discharged home as to follow-up in one week for stent removal.

## 2018-02-02 NOTE — Telephone Encounter (Signed)
Copied from [place] [phone]. Topic: Quick Communication - See Telephone Encounter >> Feb 02, 2018  9:12 AM Mylinda Latina, NT wrote: CRM for notification. See Telephone encounter for: 02/02/18. Patient mother called and states the patient was seen in the ED and he is taking oxyCODONE-acetaminophen (PERCOCET) 5-325 MG tablet  tamsulosin (FLOMAX) 0.4 MG CAPS capsule she is wondering can he take Ibuprofen and if he can do he have to wait 4 hrs after he take the oxycodone to take it  Please call CB# [phone]

## 2018-02-02 NOTE — Transfer of Care (Signed)
Immediate Anesthesia Transfer of Care Note  Patient: Paul Flowers  Procedure(s) Performed: CYSTOSCOPY/URETEROSCOPY//STENT PLACEMENT (Right Ureter)  Patient Location: PACU  Anesthesia Type:General  Level of Consciousness: sedated and patient cooperative  Airway & Oxygen Therapy: Patient Spontanous Breathing and Patient connected to face mask oxygen  Post-op Assessment: Report given to RN and Post -op Vital signs reviewed and stable  Post vital signs: Reviewed and stable  Last Vitals:  Vitals Value Taken Time  BP 136/80 02/02/2018  7:37 PM  Temp    Pulse 108 02/02/2018  7:39 PM  Resp 16 02/02/2018  7:39 PM  SpO2 99 % 02/02/2018  7:39 PM  Vitals shown include unvalidated device data.  Last Pain:  Vitals:   02/02/18 1717  TempSrc:   PainSc: Asleep      Patients Stated Pain Goal: 4 (35/68/61 6837)  Complications: No apparent anesthesia complications

## 2018-02-02 NOTE — Discharge Instructions (Signed)

## 2018-02-02 NOTE — Addendum Note (Signed)
Addended by: Carter Kitten on: 02/02/2018 09:54 AM   Modules accepted: Orders

## 2018-02-02 NOTE — Telephone Encounter (Signed)
Pt's mother, Blanch Media called, wanting advice on pt taking motrin in addition to previously prescribed pain medication; see CRM [phone] and telephone encounter dated 02/02/18@ 0912; the pt stated that he took ibuprofen at 1030;  explained to pt and his mother, that he should not take ibuprofen now because he had a dose at 1; his mother also states that he took his percocet at 45 and he is still in pain ;she wants to know if he can take the percocet even though he had the ibuprofen further explained that the prescription reads 1-2 tablets every 4 hours as needed; she verbalizes understanding; the pt states that he has an appointment with urology at 1415 today.

## 2018-02-02 NOTE — Anesthesia Postprocedure Evaluation (Signed)
Anesthesia Post Note  Patient: Paul Flowers  Procedure(s) Performed: CYSTOSCOPY/URETEROSCOPY//STENT PLACEMENT (Right Ureter)     Patient location during evaluation: PACU Anesthesia Type: General Level of consciousness: awake and alert Pain management: pain level controlled Vital Signs Assessment: post-procedure vital signs reviewed and stable Respiratory status: spontaneous breathing, nonlabored ventilation, respiratory function stable and patient connected to nasal cannula oxygen Cardiovascular status: blood pressure returned to baseline and stable Postop Assessment: no apparent nausea or vomiting Anesthetic complications: no    Last Vitals:  Vitals:   02/02/18 1941 02/02/18 1945  BP: (!) 145/84 135/75  Pulse: (!) 108 (!) 107  Resp: 17 18  Temp:  37.3 C  SpO2: 99% 98%    Last Pain:  Vitals:   02/02/18 1945  TempSrc:   PainSc: Rote S

## 2018-02-02 NOTE — Anesthesia Procedure Notes (Signed)
Procedure Name: LMA Insertion Date/Time: 02/02/2018 6:40 PM Performed by: Victoriano Lain, CRNA Pre-anesthesia Checklist: Patient identified, Emergency Drugs available, Suction available, Patient being monitored and Timeout performed Patient Re-evaluated:Patient Re-evaluated prior to induction Oxygen Delivery Method: Circle system utilized Preoxygenation: Pre-oxygenation with 100% oxygen Induction Type: IV induction Ventilation: Mask ventilation without difficulty LMA: LMA with gastric port inserted LMA Size: 4.0 Number of attempts: 1 Placement Confirmation: positive ETCO2 and breath sounds checked- equal and bilateral Tube secured with: Tape Dental Injury: Teeth and Oropharynx as per pre-operative assessment

## 2018-02-02 NOTE — H&P (Signed)
Urology Admission H&P  Chief Complaint: right flank pain  History of Present Illness: Mr Paul Flowers is a 47yo with a hx of DMII, HTN, GERD< OSA who presented to my office with severe, sharp, constant, nonradiating right flank pain that has been present for 3 days. He has been to the ER on 7/30 and 7/31 with intractable right flank pain. He underwent CT which showed 48mm right distal ureteral calculus. No fevers/chill/sweats.   Past Medical History:  Diagnosis Date  . Allergic rhinitis   . ALLERGIC RHINITIS 01/19/2007  . Diabetes ([place])    diet controlled  . GERD 01/19/2007  . GERD (gastroesophageal reflux disease)   . Hypertension    Slight  . Sleep apnea 01/29/2011   Past Surgical History:  Procedure Laterality Date  . LEFT HEART CATHETERIZATION WITH CORONARY ANGIOGRAM N/A 12/11/2013   Procedure: LEFT HEART CATHETERIZATION WITH CORONARY ANGIOGRAM;  Surgeon: Josue Hector, MD;  Location: [facility] CATH LAB;  Service: Cardiovascular;  Laterality: N/A;    Home Medications:  Current Facility-Administered Medications  Medication Dose Route Frequency Provider Last Rate Last Dose  . ceFAZolin (ANCEF) 2-4 GM/100ML-% IVPB           . ceFAZolin (ANCEF) IVPB 2g/100 mL premix  2 g Intravenous 30 min Pre-Op Cleon Gustin, MD      . lactated ringers infusion   Intravenous Continuous Albertha Ghee, MD 75 mL/hr at 02/02/18 1656     Allergies: No Known Allergies  Family History  Problem Relation Age of Onset  . Hypertension Mother   . Heart disease Father 72  . Hyperlipidemia Father   . Colonic polyp Father   . Diabetes Father   . Diabetes Maternal Grandmother   . Heart disease Maternal Grandmother   . Stroke Maternal Grandfather   . Colon cancer Neg Hx    Social History:  reports that he has never smoked. He has never used smokeless tobacco. He reports that he drinks alcohol. He reports that he does not use drugs.  Review of Systems  Gastrointestinal: Positive for nausea and vomiting.   Genitourinary: Positive for flank pain.  All other systems reviewed and are negative.   Physical Exam:  Vital signs in last 24 hours: Temp:  [98.7 F (37.1 C)-100 F (37.8 C)] 100 F (37.8 C) (08/01 1642) Pulse Rate:  [80-93] 85 (08/01 1718) BP: (139-158)/(81-90) 142/83 (08/01 1717) SpO2:  [95 %-100 %] 96 % (08/01 1718) Weight:  [84.8 kg (187 lb)] 84.8 kg (187 lb) (08/01 1624) Physical Exam  Constitutional: He is oriented to person, place, and time. He appears well-developed and well-nourished.  HENT:  Head: Normocephalic and atraumatic.  Eyes: Pupils are equal, round, and reactive to light. EOM are normal.  Neck: Normal range of motion. No thyromegaly present.  Cardiovascular: Normal rate and regular rhythm.  Respiratory: Effort normal. No respiratory distress.  GI: Soft. He exhibits no distension.  Musculoskeletal: Normal range of motion. He exhibits no edema.  Neurological: He is alert and oriented to person, place, and time.  Skin: Skin is warm and dry.  Psychiatric: He has a normal mood and affect. His behavior is normal. Judgment and thought content normal.    Laboratory Data:  Results for orders placed or performed during the hospital encounter of 02/02/18 (from the past 24 hour(s))  Glucose, capillary     Status: Abnormal   Collection Time: 02/02/18  4:40 PM  Result Value Ref Range   Glucose-Capillary 128 (H) 70 - 99 mg/dL  Recent Results (from the past 240 hour(s))  Urine culture     Status: None   Collection Time: 01/31/18  3:18 PM  Result Value Ref Range Status   Specimen Description URINE, CLEAN CATCH  Final   Special Requests NONE  Final   Culture   Final    NO GROWTH Performed at [facility], [address], [place], [place] [address]    Report Status 02/01/2018 FINAL  Final   Creatinine: Recent Labs    01/31/18 1502 02/01/18 1725 02/01/18 1858  CREATININE 0.75 1.50* 1.38*   Baseline Creatinine: 0.75  Impression/Assessment:  46yo with  right ureteral calculus, intractable pain  Plan:  The risks/benefits/alternatives to right ureteroscopic stone extraction was explained to the patient and he understands and wishes to proceed with surgery.  Nicolette Bang 02/02/2018, 5:26 PM

## 2018-02-02 NOTE — Anesthesia Preprocedure Evaluation (Signed)
Anesthesia Evaluation  Patient identified by MRN, date of birth, ID band Patient awake    Reviewed: Allergy & Precautions, H&P , NPO status , Patient's Chart, lab work & pertinent test results  Airway Mallampati: II   Neck ROM: full    Dental   Pulmonary sleep apnea ,    breath sounds clear to auscultation       Cardiovascular hypertension,  Rhythm:regular Rate:Normal     Neuro/Psych    GI/Hepatic GERD  ,  Endo/Other  diabetes, Type 2  Renal/GU Renal diseasestones     Musculoskeletal   Abdominal   Peds  Hematology   Anesthesia Other Findings   Reproductive/Obstetrics                             Anesthesia Physical Anesthesia Plan  ASA: II  Anesthesia Plan: General   Post-op Pain Management:    Induction: Intravenous  PONV Risk Score and Plan: 2 and Ondansetron, Midazolam and Treatment may vary due to age or medical condition  Airway Management Planned: LMA  Additional Equipment:   Intra-op Plan:   Post-operative Plan:   Informed Consent: I have reviewed the patients History and Physical, chart, labs and discussed the procedure including the risks, benefits and alternatives for the proposed anesthesia with the patient or authorized representative who has indicated his/her understanding and acceptance.     Plan Discussed with: CRNA, Anesthesiologist and Surgeon  Anesthesia Plan Comments:         Anesthesia Quick Evaluation

## 2018-02-02 NOTE — Telephone Encounter (Signed)
Attempted to return call to pt.@ [phone]; left vm to return call to office to discuss questions.

## 2018-02-03 ENCOUNTER — Encounter (HOSPITAL_COMMUNITY): Payer: Self-pay | Admitting: Urology

## 2018-02-09 DIAGNOSIS — N201 Calculus of ureter: Secondary | ICD-10-CM | POA: Diagnosis not present

## 2018-02-13 DIAGNOSIS — N201 Calculus of ureter: Secondary | ICD-10-CM | POA: Diagnosis not present

## 2018-03-29 DIAGNOSIS — N281 Cyst of kidney, acquired: Secondary | ICD-10-CM | POA: Diagnosis not present

## 2018-03-29 DIAGNOSIS — N200 Calculus of kidney: Secondary | ICD-10-CM | POA: Diagnosis not present

## 2018-04-07 ENCOUNTER — Other Ambulatory Visit (INDEPENDENT_AMBULATORY_CARE_PROVIDER_SITE_OTHER): Payer: Federal, State, Local not specified - PPO

## 2018-04-07 DIAGNOSIS — E119 Type 2 diabetes mellitus without complications: Secondary | ICD-10-CM

## 2018-04-07 LAB — COMPREHENSIVE METABOLIC PANEL
ALBUMIN: 4.4 g/dL (ref 3.5–5.2)
ALT: 22 U/L (ref 0–53)
AST: 20 U/L (ref 0–37)
Alkaline Phosphatase: 58 U/L (ref 39–117)
BUN: 10 mg/dL (ref 6–23)
CALCIUM: 9.6 mg/dL (ref 8.4–10.5)
CHLORIDE: 103 meq/L (ref 96–112)
CO2: 29 mEq/L (ref 19–32)
Creatinine, Ser: 0.85 mg/dL (ref 0.40–1.50)
GFR: 123.14 mL/min (ref >60.00)
Glucose, Bld: 95 mg/dL (ref 70–99)
POTASSIUM: 4.3 meq/L (ref 3.5–5.1)
Sodium: 141 mEq/L (ref 135–145)
Total Bilirubin: 1.2 mg/dL (ref 0.2–1.2)
Total Protein: 7.5 g/dL (ref 6.0–8.3)

## 2018-04-07 LAB — MICROALBUMIN / CREATININE URINE RATIO
Creatinine,U: 49.4 mg/dL
Microalb Creat Ratio: 1.4 mg/g (ref 0.0–30.0)

## 2018-04-07 LAB — HEMOGLOBIN A1C: HEMOGLOBIN A1C: 5.9 % (ref 4.6–6.5)

## 2018-04-12 ENCOUNTER — Encounter: Payer: Self-pay | Admitting: Endocrinology

## 2018-04-12 ENCOUNTER — Ambulatory Visit (INDEPENDENT_AMBULATORY_CARE_PROVIDER_SITE_OTHER): Payer: Federal, State, Local not specified - PPO | Admitting: Endocrinology

## 2018-04-12 VITALS — BP 136/72 | HR 101 | Ht 70.0 in | Wt 183.0 lb

## 2018-04-12 DIAGNOSIS — Z23 Encounter for immunization: Secondary | ICD-10-CM | POA: Diagnosis not present

## 2018-04-12 DIAGNOSIS — E119 Type 2 diabetes mellitus without complications: Secondary | ICD-10-CM | POA: Diagnosis not present

## 2018-04-12 MED ORDER — FREESTYLE LIBRE 14 DAY READER DEVI: 1.0000 | Freq: Once | 0 refills | Status: AC

## 2018-04-12 MED ORDER — FREESTYLE LIBRE 14 DAY SENSOR MISC: 1.0000 [IU] | 4 refills | Status: DC

## 2018-04-12 NOTE — Progress Notes (Signed)
Patient ID: Paul Flowers, male   DOB: 04-Jun-1971, 47 y.o.   MRN: [id number]            Reason for Appointment: Follow-up of diabetes  Referring physician: Diona Browner   History of Present Illness:          Date of diagnosis of type 2 diabetes mellitus: 12/17        Background history:   He had been told to have prediabetes about 2 years ago with highest A1c only 6.5 but no significant hyperglycemia He had not been following any particular diet or exercising and his A1c initially came down to 6.1 However in 12/17 his A1c was up to 6.8 In the third week of January 2018 he started having fatigue, severely increased thirst, frequent urination and was found to have marked hyperglycemia with blood sugar of 620 without Acidosis , had small ketones in the urine He was discharged on metformin  Recent history:   Non-insulin hypoglycemic drugs the patient is taking are: Janumet XR 100/1000 daily  He was switched from metformin to Janumet XR on his initial consultation in 08/2016 because of diarrhea with metformin  Most recent is normal again at 5.9    Current management, blood sugar patterns and problems identified:  He has checked his blood sugars at different times of the day especially after lunch  Highest reading is only 150  Overall he is still fairly active and he thinks he is walking up to 10,000 steps a day with various activities  Is still conscientious about his diet with carbohydrates and portions and his weight is slightly better compared to August  No side effects from Janumet        Side effects from medications have been: diarrhea from metformin  Glucose monitoring:  done 0.5 times a day         Glucometer:  One Touch Verio  Blood Glucose readings by time of day and averages from download of the monitor :  AVERAGE 106  Blood sugar range 73-150 with morning readings from 89 up to 119   Self-care: The diet that the patient has been following is: tries to limit   Carbohydrates, sweets and drinks with sugar.     Meal times are:  Breakfast is at  7 AM Lunch: at work during [employer]: 8 pm                Dietician visit, most recent: 10/2016               Exercise:  doing walking at work, no formal exercise  Weight history:  Wt Readings from Last 3 Encounters:  04/12/18 183 lb (83 kg)  02/02/18 187 lb (84.8 kg)  02/01/18 183 lb (83 kg)    Glycemic control:   Lab Results  Component Value Date   HGBA1C 5.9 04/07/2018   HGBA1C 6.1 01/06/2018   HGBA1C 5.8 10/07/2017   Lab Results  Component Value Date   MICROALBUR <0.7 04/07/2018   LDLCALC 43 01/06/2018   CREATININE 0.85 04/07/2018   Lab Results  Component Value Date   MICRALBCREAT 1.4 04/07/2018    Lab Results  Component Value Date   FRUCTOSAMINE 277 09/15/2016     Allergies as of 04/12/2018   No Known Allergies     Medication List        Accurate as of 04/12/18  9:36 AM. Always use your most recent med list.  atorvastatin 20 MG tablet Commonly known as:  LIPITOR TAKE 1 TABLET BY MOUTH EVERY DAY   diazepam 2 MG tablet Commonly known as:  VALIUM Take 1 tablet (2 mg total) by mouth 2 (two) times daily.   FREESTYLE LIBRE 14 DAY READER Devi 1 Device by Does not apply route once for 1 dose.   FREESTYLE LIBRE 14 DAY SENSOR Misc 1 Units by Does not apply route every 14 (fourteen) days.   glucose blood test strip Use to test blood sugar once daily   JANUMET XR [phone] MG Tb24 Generic drug:  SitaGLIPtin-MetFORMIN HCl TAKE 1 TABLET BY MOUTH DAILY   losartan 25 MG tablet Commonly known as:  COZAAR TAKE 1 TABLET BY MOUTH EVERY DAY   mometasone 50 MCG/ACT nasal spray Commonly known as:  NASONEX PLACE 2 SPRAYS INTO THE NOSE DAILY.   montelukast 10 MG tablet Commonly known as:  SINGULAIR TAKE 1 TABLET BY MOUTH EVERYDAY AT BEDTIME   multivitamin with minerals Tabs tablet Take 1 tablet by mouth daily.   ONE TOUCH LANCETS Misc Use to test blood  sugar once daily   pantoprazole 40 MG tablet Commonly known as:  PROTONIX Take 1 tablet (40 mg total) by mouth daily.       Allergies: No Known Allergies  Past Medical History:  Diagnosis Date  . Allergic rhinitis   . ALLERGIC RHINITIS 01/19/2007  . Diabetes ([place])    diet controlled  . GERD 01/19/2007  . GERD (gastroesophageal reflux disease)   . Hypertension    Slight  . Sleep apnea 01/29/2011    Past Surgical History:  Procedure Laterality Date  . CYSTOSCOPY/URETEROSCOPY/HOLMIUM LASER/STENT PLACEMENT Right 02/02/2018   Procedure: CYSTOSCOPY/URETEROSCOPY//STENT PLACEMENT;  Surgeon: Cleon Gustin, MD;  Location: [facility];  Service: Urology;  Laterality: Right;  . LEFT HEART CATHETERIZATION WITH CORONARY ANGIOGRAM N/A 12/11/2013   Procedure: LEFT HEART CATHETERIZATION WITH CORONARY ANGIOGRAM;  Surgeon: Josue Hector, MD;  Location: [facility] CATH LAB;  Service: Cardiovascular;  Laterality: N/A;    Family History  Problem Relation Age of Onset  . Hypertension Mother   . Heart disease Father 14  . Hyperlipidemia Father   . Colonic polyp Father   . Diabetes Father   . Diabetes Maternal Grandmother   . Heart disease Maternal Grandmother   . Stroke Maternal Grandfather   . Colon cancer Neg Hx     Social History:  reports that he has never smoked. He has never used smokeless tobacco. He reports that he drinks alcohol. He reports that he does not use drugs.   Review of Systems   Lipid history: He has been  taking Lipitor 20 mg daily for baseline LDL of 141 and long-standing history of hypercholesterolemia  Last results are excellent:   Lab Results  Component Value Date   CHOL 132 01/06/2018   HDL 57.30 01/06/2018   LDLCALC 43 01/06/2018   LDLDIRECT 149.3 02/12/2013   TRIG 157.0 (H) 01/06/2018   CHOLHDL 2 01/06/2018           Hypertension:  treated with  25 mg losartan, Followed by PCP   BP Readings from Last 3 Encounters:  04/12/18 136/72  02/02/18 (!) 157/90    02/01/18 140/81     Most recent eye exam was In 1/18  Most recent foot exam: 10/2017   Physical Examination:  BP 136/72   Pulse (!) 101   Ht 5\' 10"  (1.778 m)   Wt 183 lb (83 kg)   SpO2 98%  BMI 26.26 kg/m      ASSESSMENT:  Diabetes type 2, Nonobese  See history of present illness for detailed discussion of current diabetes management, blood sugar patterns and problems identified   A1c is again normal at 5.8 He is only on Janumet XR 100/1000 1 tablet daily He has fairly good blood sugars at home although not always taking after meals He may have gained some weight with not being as active and inconsistent control of portions Discussed importance of keeping his weight the same or slightly lower and doing formal exercise  Even though he had severe diabetes at the onset his blood sugars are very well controlled indicating near normal pancreatic function He is also fairly motivated to continue watching his diet and stay active    Mild hypertension: Controlled   PLAN:    He will continue Janumet XR.  Discussed blood sugar targets at various times and he can continue to check readings by rotation at different times  Encouraged him to stay active  Again follow-up in 6 months    Influenza vaccine given  There are no Patient Instructions on file for this visit.    Elayne Snare 04/12/2018, 9:36 AM   Note: This office note was prepared with Dragon voice recognition system technology. Any transcriptional errors that result from this process are unintentional.

## 2018-04-26 ENCOUNTER — Telehealth: Payer: Self-pay | Admitting: Endocrinology

## 2018-04-26 NOTE — Telephone Encounter (Signed)
LM for pt letting him know his results have been printed and are at the front to be picked up

## 2018-04-26 NOTE — Telephone Encounter (Signed)
Patient is asking if they can have the last 3 a1c print outs for [employer], make sure it has company letter head. Please Advise. Will pick up in office. Ph # [phone]

## 2018-04-28 ENCOUNTER — Telehealth: Payer: Self-pay | Admitting: Endocrinology

## 2018-04-28 NOTE — Telephone Encounter (Signed)
Letter printed and placed up front for pick up also called pt and lft vm with this info

## 2018-04-28 NOTE — Telephone Encounter (Signed)
Pt need a copy of his A1C for April 5th and Oct 4th visit on our letter head. Please call pt to come and pick up when ready.

## 2018-05-30 ENCOUNTER — Other Ambulatory Visit: Payer: Self-pay | Admitting: Endocrinology

## 2018-06-23 DIAGNOSIS — K080 Exfoliation of teeth due to systemic causes: Secondary | ICD-10-CM | POA: Diagnosis not present

## 2018-07-26 ENCOUNTER — Other Ambulatory Visit: Payer: Self-pay | Admitting: Family Medicine

## 2018-08-17 LAB — HM DIABETES EYE EXAM

## 2018-08-21 ENCOUNTER — Encounter: Payer: Self-pay | Admitting: Family Medicine

## 2018-09-28 ENCOUNTER — Telehealth: Payer: Self-pay | Admitting: Family Medicine

## 2018-09-28 MED ORDER — RABEPRAZOLE SODIUM 20 MG PO TBEC: 20.0000 mg | DELAYED_RELEASE_TABLET | Freq: Every day | ORAL | 1 refills | Status: DC

## 2018-09-28 NOTE — Telephone Encounter (Signed)
Paul Flowers notified as instructed by telephone.  He is agreeable to trying a different GERD medication.  The only thing he has tried in the past , other than pantoprazole, is Prevacid.

## 2018-09-28 NOTE — Telephone Encounter (Signed)
I sent in aciphex to local pharmacy for him to try but it looks like this as well as other options will still require a PA though.  Does he know if any other PPI covered?  If symptoms not improving in 2 weeks on new med... needs to call for further eval.

## 2018-09-28 NOTE — Telephone Encounter (Signed)
Pt need refill on his medication because he said some days he had to take more. Please advise pt  Pantoprazole 40 mg   [facility]

## 2018-09-28 NOTE — Telephone Encounter (Signed)
Insurance will not pay for more than 1 time daily unless erosive esophagitis etc. I do not see that he has had endoscopy to look into this. Prior to send him to GI given the current need to avoid OV unless absolutely necessary.. I would instead suggest trying a differnet medication for GERd.. what has he tried other than pantoprazole? Aciphex, nexium, dexilant, etc?  Is he dogin the following trigger avoidance also?  * elimination of dietary triggers: caffeine, chocolate, spicy foods, food with high fat content, carbonated beverages, and peppermint, alcohol, citris, tomato

## 2018-09-28 NOTE — Telephone Encounter (Signed)
Received fax from [facility] stating insurance will only pay for 90 tablets in 365 days without a prior authorization.  I can do a PA but wanted to wait to see if you needed to change directions/dosage since patient is taking it more than prescribed.  Please advise.

## 2018-09-28 NOTE — Addendum Note (Signed)
Addended by: Eliezer Lofts E on: 09/28/2018 04:43 PM   Modules accepted: Orders

## 2018-09-29 NOTE — Telephone Encounter (Signed)
Paul Flowers notified as instructed by telephone.  Patient states understanding.

## 2018-10-06 ENCOUNTER — Other Ambulatory Visit: Payer: Self-pay

## 2018-10-06 ENCOUNTER — Telehealth: Payer: Self-pay | Admitting: *Deleted

## 2018-10-06 ENCOUNTER — Other Ambulatory Visit (INDEPENDENT_AMBULATORY_CARE_PROVIDER_SITE_OTHER): Payer: Federal, State, Local not specified - PPO

## 2018-10-06 DIAGNOSIS — E119 Type 2 diabetes mellitus without complications: Secondary | ICD-10-CM | POA: Diagnosis not present

## 2018-10-06 LAB — LIPID PANEL
Cholesterol: 135 mg/dL (ref 0–200)
HDL: 51.9 mg/dL (ref >39.00)
LDL Cholesterol: 69 mg/dL (ref 0–99)
NonHDL: 82.63
Total CHOL/HDL Ratio: 3
Triglycerides: 68 mg/dL (ref 0.0–149.0)
VLDL: 13.6 mg/dL (ref 0.0–40.0)

## 2018-10-06 LAB — COMPREHENSIVE METABOLIC PANEL
ALT: 29 U/L (ref 0–53)
AST: 24 U/L (ref 0–37)
Albumin: 4.4 g/dL (ref 3.5–5.2)
Alkaline Phosphatase: 71 U/L (ref 39–117)
BUN: 10 mg/dL (ref 6–23)
CO2: 28 mEq/L (ref 19–32)
Calcium: 9.8 mg/dL (ref 8.4–10.5)
Chloride: 103 mEq/L (ref 96–112)
Creatinine, Ser: 0.87 mg/dL (ref 0.40–1.50)
GFR: 112.56 mL/min (ref >60.00)
Glucose, Bld: 108 mg/dL — ABNORMAL HIGH (ref 70–99)
Potassium: 4.5 mEq/L (ref 3.5–5.1)
Sodium: 139 mEq/L (ref 135–145)
Total Bilirubin: 1.1 mg/dL (ref 0.2–1.2)
Total Protein: 7.4 g/dL (ref 6.0–8.3)

## 2018-10-06 LAB — HEMOGLOBIN A1C: Hgb A1c MFr Bld: 6.8 % — ABNORMAL HIGH (ref 4.6–6.5)

## 2018-10-06 NOTE — Telephone Encounter (Signed)
Received fax from [facility] stating insurance will only cover 90 tabs of a PPI in 365 days without prior authorization.  PA completed on CoverMyMeds.  Sent for review.  Can take up to 72 hours for a decision.

## 2018-10-10 NOTE — Telephone Encounter (Signed)
PA approved through 10/06/2019.  [facility] notified of approve via fax.

## 2018-10-12 ENCOUNTER — Ambulatory Visit: Payer: Federal, State, Local not specified - PPO | Admitting: Endocrinology

## 2018-10-16 ENCOUNTER — Ambulatory Visit (INDEPENDENT_AMBULATORY_CARE_PROVIDER_SITE_OTHER): Payer: Federal, State, Local not specified - PPO | Admitting: Endocrinology

## 2018-10-16 ENCOUNTER — Other Ambulatory Visit: Payer: Self-pay

## 2018-10-16 ENCOUNTER — Encounter: Payer: Self-pay | Admitting: Endocrinology

## 2018-10-16 DIAGNOSIS — E1165 Type 2 diabetes mellitus with hyperglycemia: Secondary | ICD-10-CM

## 2018-10-16 NOTE — Progress Notes (Signed)
Patient ID: Paul Flowers, male   DOB: 19-Mar-1971, 48 y.o.   MRN: [id number]            Reason for Appointment: Follow-up of diabetes  Referring physician: Diona Browner   Today's office visit was provided via telemedicine using video technique . Consent for the patient has been obtained . Location of the patient: Home . Location of the provider: Office Only the patient and myself were participating in the encounter   History of Present Illness:          Date of diagnosis of type 2 diabetes mellitus: 12/17        Background history:   He had been told to have prediabetes about 2 years ago with highest A1c only 6.5 but no significant hyperglycemia He had not been following any particular diet or exercising and his A1c initially came down to 6.1 However in 12/17 his A1c was up to 6.8 In the third week of January 2018 he started having fatigue, severely increased thirst, frequent urination and was found to have marked hyperglycemia with blood sugar of 620 without Acidosis , had small ketones in the urine He was discharged on metformin  Recent history:   Non-insulin hypoglycemic drugs the patient is taking are: Janumet XR 100/1000 daily  He was switched from metformin to Janumet XR on his initial consultation in 08/2016 because of diarrhea with metformin  Most recent is higher at 6.8, previously stable at 5.9    Current management, blood sugar patterns and problems identified:  He has had erratic blood sugars  Most likely he says that his sugars are higher because of going off his diet, eating sweets as well as eating out more  Not clear if he has gained weight  He does check readings mostly before breakfast and dinnertime and not after meals  He says that highest blood sugar in the morning was 252 has checked his blood sugars at different times of the day especially after lunch  He says he walks between 7000-10,000 steps a day with his work but cannot exercise in the evening  because of coming back late His lab glucose was 108 fasting       Side effects from medications have been: diarrhea from metformin  Glucose monitoring:  done 0.5 times a day         Glucometer:  One Touch Verio  Blood Glucose readings by time of day by recall, patient did not have his meter at the time of the virtual visit  AVERAGE about 120 Range 70-252   Self-care: The diet that the patient has been following is: tries to limit  Carbohydrates, sweets and drinks with sugar.     Meal times are:  Breakfast is at  7 AM Lunch: at work during [employer]: 8 pm                Dietician visit, most recent: 10/2016               Exercise:  doing walking at work, no formal exercise  Weight history:  Wt Readings from Last 3 Encounters:  04/12/18 183 lb (83 kg)  02/02/18 187 lb (84.8 kg)  02/01/18 183 lb (83 kg)    Glycemic control:   Lab Results  Component Value Date   HGBA1C 6.8 (H) 10/06/2018   HGBA1C 5.9 04/07/2018   HGBA1C 6.1 01/06/2018   Lab Results  Component Value Date   MICROALBUR <0.7 04/07/2018   [place] 69  10/06/2018   CREATININE 0.87 10/06/2018   Lab Results  Component Value Date   MICRALBCREAT 1.4 04/07/2018    Lab Results  Component Value Date   FRUCTOSAMINE 277 09/15/2016     Allergies as of 10/16/2018   No Known Allergies     Medication List       Accurate as of October 16, 2018  8:42 AM. Always use your most recent med list.        atorvastatin 20 MG tablet Commonly known as:  LIPITOR TAKE 1 TABLET BY MOUTH EVERY DAY   diazepam 2 MG tablet Commonly known as:  VALIUM Take 1 tablet (2 mg total) by mouth 2 (two) times daily.   FreeStyle Libre 14 Day Sensor Misc 1 Units by Does not apply route every 14 (fourteen) days.   glucose blood test strip Commonly known as:  OneTouch Verio Use to test blood sugar once daily   Janumet XR [phone] MG Tb24 Generic drug:  SitaGLIPtin-MetFORMIN HCl TAKE 1 TABLET BY MOUTH DAILY   losartan 25 MG  tablet Commonly known as:  COZAAR TAKE 1 TABLET BY MOUTH EVERY DAY   mometasone 50 MCG/ACT nasal spray Commonly known as:  NASONEX PLACE 2 SPRAYS INTO THE NOSE DAILY.   montelukast 10 MG tablet Commonly known as:  SINGULAIR TAKE 1 TABLET BY MOUTH EVERYDAY AT BEDTIME   multivitamin with minerals Tabs tablet Take 1 tablet by mouth daily.   ONE TOUCH LANCETS Misc Use to test blood sugar once daily   pantoprazole 40 MG tablet Commonly known as:  PROTONIX Take 1 tablet (40 mg total) by mouth daily.   RABEprazole 20 MG tablet Commonly known as:  ACIPHEX Take 1 tablet (20 mg total) by mouth daily. Failure of pantoprazole, prevacid       Allergies: No Known Allergies  Past Medical History:  Diagnosis Date  . Allergic rhinitis   . ALLERGIC RHINITIS 01/19/2007  . Diabetes ([place])    diet controlled  . GERD 01/19/2007  . GERD (gastroesophageal reflux disease)   . Hypertension    Slight  . Sleep apnea 01/29/2011    Past Surgical History:  Procedure Laterality Date  . CYSTOSCOPY/URETEROSCOPY/HOLMIUM LASER/STENT PLACEMENT Right 02/02/2018   Procedure: CYSTOSCOPY/URETEROSCOPY//STENT PLACEMENT;  Surgeon: Cleon Gustin, MD;  Location: [facility];  Service: Urology;  Laterality: Right;  . LEFT HEART CATHETERIZATION WITH CORONARY ANGIOGRAM N/A 12/11/2013   Procedure: LEFT HEART CATHETERIZATION WITH CORONARY ANGIOGRAM;  Surgeon: Josue Hector, MD;  Location: [facility] CATH LAB;  Service: Cardiovascular;  Laterality: N/A;    Family History  Problem Relation Age of Onset  . Hypertension Mother   . Heart disease Father 45  . Hyperlipidemia Father   . Colonic polyp Father   . Diabetes Father   . Diabetes Maternal Grandmother   . Heart disease Maternal Grandmother   . Stroke Maternal Grandfather   . Colon cancer Neg Hx     Social History:  reports that he has never smoked. He has never used smokeless tobacco. He reports current alcohol use. He reports that he does not use drugs.    Review of Systems   Lipid history: He has been  taking Lipitor 20 mg daily for baseline LDL of 141 and long-standing history of hypercholesterolemia  Last results are excellent:   Lab Results  Component Value Date   CHOL 135 10/06/2018   HDL 51.90 10/06/2018   LDLCALC 69 10/06/2018   LDLDIRECT 149.3 02/12/2013   TRIG 68.0 10/06/2018  CHOLHDL 3 10/06/2018           Hypertension:  treated with  25 mg losartan, prescribed by PCP   BP Readings from Last 3 Encounters:  04/12/18 136/72  02/02/18 (!) 157/90  02/01/18 140/81     Most recent eye exam was In 08/2018, negative  Most recent foot exam: 10/2017   Physical Examination:  There were no vitals taken for this visit.     ASSESSMENT:  Diabetes type 2, Nonobese  See history of present illness for detailed discussion of current diabetes management, blood sugar patterns and problems identified   His A1c is significantly higher at 6.8 compared to five-point  He is only on Janumet XR 100/1000 1 tablet daily As discussed above he feels that his compliance with diet has been poor and he has not been motivated to watch his diet He is relatively active at work but not doing any formal exercise Not He has fairly good blood sugars at home although not always taking after meals He may have gained some weight with not being as active and inconsistent control of portions Discussed importance of keeping his weight the same or slightly lower and doing formal exercise  Even though he had severe diabetes at the onset his blood sugars are very well controlled indicating near normal pancreatic function He is also fairly motivated to continue watching his diet and stay active   LIPIDS: LDL is below 70  PLAN:    He will continue Janumet XR for now. Discussed need to consistently improve diet More regular exercise at least on his days off He can start checking blood sugars about 2 hours after meals especially when not eating at  home If his blood sugars are consistently over 200 after meals he will need to call and we can consider adding Invokana Follow-up in 3 months   There are no Patient Instructions on file for this visit.    Elayne Snare 10/16/2018, 8:42 AM   Note: This office note was prepared with Dragon voice recognition system technology. Any transcriptional errors that result from this process are unintentional.

## 2018-11-13 ENCOUNTER — Other Ambulatory Visit: Payer: Self-pay | Admitting: *Deleted

## 2018-11-13 ENCOUNTER — Other Ambulatory Visit: Payer: Self-pay | Admitting: Family Medicine

## 2018-12-10 ENCOUNTER — Other Ambulatory Visit: Payer: Self-pay | Admitting: Family Medicine

## 2018-12-14 ENCOUNTER — Other Ambulatory Visit: Payer: Self-pay | Admitting: Endocrinology

## 2019-01-10 ENCOUNTER — Telehealth: Payer: Self-pay

## 2019-01-10 NOTE — Telephone Encounter (Signed)
Left detailed VM w COVID screen and back door lab info   

## 2019-01-11 ENCOUNTER — Telehealth: Payer: Self-pay | Admitting: Family Medicine

## 2019-01-11 DIAGNOSIS — Z125 Encounter for screening for malignant neoplasm of prostate: Secondary | ICD-10-CM

## 2019-01-11 DIAGNOSIS — E119 Type 2 diabetes mellitus without complications: Secondary | ICD-10-CM

## 2019-01-11 DIAGNOSIS — E7800 Pure hypercholesterolemia, unspecified: Secondary | ICD-10-CM

## 2019-01-11 NOTE — Telephone Encounter (Signed)
-----   Message from Ellamae Sia sent at 01/08/2019  3:42 PM EDT ----- Regarding: Lab orders for Friday, 7.10.20 Patient is scheduled for CPX labs, please order future labs, Thanks , Karna Christmas

## 2019-01-12 ENCOUNTER — Other Ambulatory Visit (INDEPENDENT_AMBULATORY_CARE_PROVIDER_SITE_OTHER): Payer: Federal, State, Local not specified - PPO

## 2019-01-12 ENCOUNTER — Other Ambulatory Visit: Payer: Self-pay

## 2019-01-12 DIAGNOSIS — E119 Type 2 diabetes mellitus without complications: Secondary | ICD-10-CM

## 2019-01-12 DIAGNOSIS — Z125 Encounter for screening for malignant neoplasm of prostate: Secondary | ICD-10-CM | POA: Diagnosis not present

## 2019-01-12 LAB — HEMOGLOBIN A1C: Hgb A1c MFr Bld: 6.3 % (ref 4.6–6.5)

## 2019-01-12 LAB — PSA: PSA: 0.32 ng/mL (ref 0.10–4.00)

## 2019-01-19 ENCOUNTER — Other Ambulatory Visit: Payer: Self-pay

## 2019-01-19 ENCOUNTER — Encounter: Payer: Self-pay | Admitting: Family Medicine

## 2019-01-19 ENCOUNTER — Ambulatory Visit (INDEPENDENT_AMBULATORY_CARE_PROVIDER_SITE_OTHER): Payer: Federal, State, Local not specified - PPO | Admitting: Family Medicine

## 2019-01-19 VITALS — BP 128/88 | HR 102 | Temp 98.6°F | Ht 69.75 in | Wt 186.6 lb

## 2019-01-19 DIAGNOSIS — Z0000 Encounter for general adult medical examination without abnormal findings: Secondary | ICD-10-CM

## 2019-01-19 DIAGNOSIS — E119 Type 2 diabetes mellitus without complications: Secondary | ICD-10-CM | POA: Diagnosis not present

## 2019-01-19 DIAGNOSIS — E7800 Pure hypercholesterolemia, unspecified: Secondary | ICD-10-CM | POA: Diagnosis not present

## 2019-01-19 DIAGNOSIS — Z23 Encounter for immunization: Secondary | ICD-10-CM

## 2019-01-19 DIAGNOSIS — I10 Essential (primary) hypertension: Secondary | ICD-10-CM

## 2019-01-19 DIAGNOSIS — Z1211 Encounter for screening for malignant neoplasm of colon: Secondary | ICD-10-CM | POA: Diagnosis not present

## 2019-01-19 LAB — HM DIABETES FOOT EXAM

## 2019-01-19 MED ORDER — MONTELUKAST SODIUM 10 MG PO TABS: ORAL_TABLET | ORAL | 3 refills | Status: DC

## 2019-01-19 NOTE — Addendum Note (Signed)
Addended by: Marrion Coy on: 01/19/2019 09:59 AM   Modules accepted: Orders

## 2019-01-19 NOTE — Patient Instructions (Addendum)
Get back on track with healthy eating and regular exercise. We will call with colonoscopy referral.

## 2019-01-19 NOTE — Progress Notes (Signed)
Chief Complaint  Patient presents with  . Annual Exam    Patient is here today for a CPE. Fasting labs were completed on 7.17.20, Pt is compliant with medications.    History of Present Illness: HPI  The patient is here for annual wellness exam and preventative care.    Hypertension:    At goal on losartan. BP Readings from Last 3 Encounters:  01/19/19 128/88  04/12/18 136/72  02/02/18 (!) 157/90  Using medication without problems or lightheadedness:  none Chest pain with exertion:none Edema:none Short of breath:none Average home BPs: Other issues:   DM: followed by D.[facility] END[facility]. Lab Results  Component Value Date   HGBA1C 6.3 01/12/2019   Diet:  moderate Exercise: walking some   Some ache in feet in morning.  Elevated Cholesterol:  LDL at goal  On atorvastatin  Lab Results  Component Value Date   CHOL 135 10/06/2018   HDL 51.90 10/06/2018   LDLCALC 69 10/06/2018   LDLDIRECT 149.3 02/12/2013   TRIG 68.0 10/06/2018   CHOLHDL 3 10/06/2018    Urologist Dr. Terance Hart.Marland Kitchen treated ureteral stone 02/2018   COVID 19 screen No recent travel or known exposure to [place] The patient denies respiratory symptoms of COVID 19 at this time.  The importance of social distancing was discussed today.   Review of Systems  Constitutional: Negative for chills and fever.  HENT: Negative for congestion and ear pain.   Eyes: Negative for pain and redness.  Respiratory: Negative for cough and shortness of breath.   Cardiovascular: Negative for chest pain, palpitations and leg swelling.  Gastrointestinal: Negative for abdominal pain, blood in stool, constipation, diarrhea, nausea and vomiting.  Genitourinary: Negative for dysuria.  Musculoskeletal: Negative for falls and myalgias.  Skin: Negative for rash.  Neurological: Negative for dizziness.  Psychiatric/Behavioral: Negative for depression. The patient is not nervous/anxious.       Past Medical History:  Diagnosis Date  .  Allergic rhinitis   . ALLERGIC RHINITIS 01/19/2007  . Diabetes ([place])    diet controlled  . GERD 01/19/2007  . GERD (gastroesophageal reflux disease)   . Hypertension    Slight  . Sleep apnea 01/29/2011    reports that he has never smoked. He has never used smokeless tobacco. He reports current alcohol use. He reports that he does not use drugs.   Current Outpatient Medications:  .  atorvastatin (LIPITOR) 20 MG tablet, TAKE 1 TABLET BY MOUTH EVERY DAY, Disp: 90 tablet, Rfl: 3 .  glucose blood (ONETOUCH VERIO) test strip, Use to test blood sugar once daily, Disp: 100 each, Rfl: 5 .  JANUMET XR [phone] MG TB24, TAKE 1 TABLET BY MOUTH DAILY, Disp: 30 tablet, Rfl: 5 .  losartan (COZAAR) 25 MG tablet, TAKE 1 TABLET BY MOUTH EVERY DAY, Disp: 90 tablet, Rfl: 1 .  mometasone (NASONEX) 50 MCG/ACT nasal spray, Place 2 sprays into the nose daily as needed (congestion)., Disp: 17 g, Rfl: 2 .  montelukast (SINGULAIR) 10 MG tablet, TAKE 1 TABLET BY MOUTH EVERYDAY AT BEDTIME (Patient taking differently: Take 10 mg by mouth at bedtime as needed (allergies). ), Disp: 90 tablet, Rfl: 3 .  Multiple Vitamin (MULTIVITAMIN WITH MINERALS) TABS tablet, Take 1 tablet by mouth daily., Disp: , Rfl:  .  ONE TOUCH LANCETS MISC, Use to test blood sugar once daily, Disp: 200 each, Rfl: 2 .  RABEprazole (ACIPHEX) 20 MG tablet, TAKE 1 TABLET (20 MG TOTAL) BY MOUTH DAILY. FAILURE OF PANTOPRAZOLE, PREVACID, Disp: 90  tablet, Rfl: 0   Observations/Objective: Blood pressure 128/88, pulse (!) 102, temperature 98.6 F (37 C), temperature source Oral, height 5' 9.75" (1.772 m), weight 186 lb 9.6 oz (84.6 kg), SpO2 97 %.  Physical Exam Constitutional:      General: He is not in acute distress.    Appearance: Normal appearance. He is well-developed. He is not ill-appearing or toxic-appearing.  HENT:     Head: Normocephalic and atraumatic.     Right Ear: Hearing, tympanic membrane, ear canal and external ear normal.     Left  Ear: Hearing, tympanic membrane, ear canal and external ear normal.     Nose: Nose normal.     Mouth/Throat:     Pharynx: Uvula midline.  Eyes:     General: Lids are normal. Lids are everted, no foreign bodies appreciated.     Conjunctiva/sclera: Conjunctivae normal.     Pupils: Pupils are equal, round, and reactive to light.  Neck:     Musculoskeletal: Normal range of motion and neck supple.     Thyroid: No thyroid mass or thyromegaly.     Vascular: No carotid bruit.     Trachea: Trachea and phonation normal.  Cardiovascular:     Rate and Rhythm: Normal rate and regular rhythm.     Pulses: Normal pulses.     Heart sounds: S1 normal and S2 normal. No murmur. No gallop.   Pulmonary:     Breath sounds: Normal breath sounds. No wheezing, rhonchi or rales.  Abdominal:     General: Bowel sounds are normal.     Palpations: Abdomen is soft.     Tenderness: There is no abdominal tenderness. There is no guarding or rebound.     Hernia: No hernia is present. There is no hernia in the left inguinal area.  Genitourinary:    Penis: Normal. No paraphimosis or tenderness.      Scrotum/Testes: Normal.        Right: Mass or tenderness not present.        Left: Mass or tenderness not present.     Prostate: Normal. Not enlarged and not tender.     Rectum: Guaiac result negative. No mass, tenderness, anal fissure, external hemorrhoid or internal hemorrhoid. Normal anal tone.  Lymphadenopathy:     Cervical: No cervical adenopathy.  Skin:    General: Skin is warm and dry.     Findings: No rash.  Neurological:     Mental Status: He is alert.     Cranial Nerves: No cranial nerve deficit.     Sensory: No sensory deficit.     Gait: Gait normal.     Deep Tendon Reflexes: Reflexes are normal and symmetric.  Psychiatric:        Speech: Speech normal.        Behavior: Behavior normal.        Judgment: Judgment normal.      Diabetic foot exam: Normal inspection No skin breakdown No calluses   Normal DP pulses Normal sensation to light touch and monofilament Nails normal  Assessment and Plan The patient's preventative maintenance and recommended screening tests for an annual wellness exam were reviewed in full today. Brought up to date unless services declined.  Counselled on the importance of diet, exercise, and its role in overall health and mortality. The patient's FH and SH was reviewed, including their home life, tobacco status, and drug and alcohol status.    Colon cancer: none, no family history... plan colonocsopy HIV screen:Refused Nonsmoker  Vaccines: Uptodate except decline Pneumovax  PSA started age 74 given AA..  Lab Results  Component Value Date   PSA 0.32 01/12/2019   PSA 0.35 01/13/2018   PSA 0.37 06/08/2016      Eliezer Lofts, MD

## 2019-02-09 ENCOUNTER — Other Ambulatory Visit: Payer: Self-pay | Admitting: Family Medicine

## 2019-02-16 ENCOUNTER — Other Ambulatory Visit: Payer: Self-pay | Admitting: Family Medicine

## 2019-03-05 DIAGNOSIS — N200 Calculus of kidney: Secondary | ICD-10-CM | POA: Diagnosis not present

## 2019-03-21 ENCOUNTER — Telehealth: Payer: Self-pay | Admitting: Gastroenterology

## 2019-03-21 NOTE — Telephone Encounter (Signed)
Patient returning call to schedule a colonoscopy. °

## 2019-03-26 ENCOUNTER — Telehealth: Payer: Self-pay

## 2019-03-26 NOTE — Telephone Encounter (Signed)
Returned patients call to schedule his colonoscopy.  LVM for him to call me back to schedule.  Thanks [employer]

## 2019-03-27 ENCOUNTER — Ambulatory Visit: Payer: Federal, State, Local not specified - PPO | Admitting: Family Medicine

## 2019-03-27 ENCOUNTER — Encounter: Payer: Self-pay | Admitting: Family Medicine

## 2019-03-27 ENCOUNTER — Other Ambulatory Visit: Payer: Self-pay

## 2019-03-27 VITALS — BP 120/80 | HR 97 | Temp 98.1°F | Ht 69.75 in | Wt 188.2 lb

## 2019-03-27 DIAGNOSIS — F321 Major depressive disorder, single episode, moderate: Secondary | ICD-10-CM | POA: Diagnosis not present

## 2019-03-27 DIAGNOSIS — F411 Generalized anxiety disorder: Secondary | ICD-10-CM | POA: Diagnosis not present

## 2019-03-27 DIAGNOSIS — Z23 Encounter for immunization: Secondary | ICD-10-CM | POA: Diagnosis not present

## 2019-03-27 MED ORDER — SERTRALINE HCL 50 MG PO TABS: 50.0000 mg | ORAL_TABLET | Freq: Every day | ORAL | 3 refills | Status: DC

## 2019-03-27 NOTE — Progress Notes (Signed)
Chief Complaint  Patient presents with  . Anxiety  . Stress    History of Present Illness: HPI  48 year old male patient presents with  New onset mood issues.  He reports he started new job in 09/2018 in post [employer].  Increased stress. 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 he became overwhelmed. Stressul and overwhelming work Paramedic.  He has been more irritable, tearful, more emotional lately. Decreased motivation.    No issues sleeping at night but job interfering with hours he is able to sleep... only getting 3 houra sleep a night. trouble concentration, excessive worrying, increase anxiety, cannot relax. Daily headache, neck tightness.   No SI.. but " whats the use of taking my meds' no suicidal plan.   GAd7: 17  PHQ9  19  COVID 19 screen No recent travel or known exposure to COVID19 The patient denies respiratory symptoms of COVID 19 at this time.  The importance of social distancing was discussed today.   Review of Systems  Constitutional: Negative for chills and fever.  HENT: Negative for congestion and ear pain.   Eyes: Negative for pain and redness.  Respiratory: Negative for cough and shortness of breath.   Cardiovascular: Negative for chest pain, palpitations and leg swelling.  Gastrointestinal: Negative for abdominal pain, blood in stool, constipation, diarrhea, nausea and vomiting.  Genitourinary: Negative for dysuria.  Musculoskeletal: Negative for falls and myalgias.  Skin: Negative for rash.  Neurological: Negative for dizziness.  Psychiatric/Behavioral: Positive for depression. Negative for hallucinations, substance abuse and suicidal ideas. The patient is nervous/anxious.       Past Medical History:  Diagnosis Date  . Allergic rhinitis   . ALLERGIC RHINITIS 01/19/2007  . Diabetes ([place])    diet controlled  . GERD 01/19/2007  .  GERD (gastroesophageal reflux disease)   . Hypertension    Slight  . Sleep apnea 01/29/2011    reports that he has never smoked. He has never used smokeless tobacco. He reports current alcohol use. He reports that he does not use drugs.   Current Outpatient Medications:  .  atorvastatin (LIPITOR) 20 MG tablet, TAKE 1 TABLET BY MOUTH EVERY DAY, Disp: 90 tablet, Rfl: 3 .  glucose blood (ONETOUCH VERIO) test strip, Use to test blood sugar once daily, Disp: 100 each, Rfl: 5 .  JANUMET XR [phone] MG TB24, TAKE 1 TABLET BY MOUTH DAILY, Disp: 30 tablet, Rfl: 5 .  losartan (COZAAR) 25 MG tablet, TAKE 1 TABLET BY MOUTH EVERY DAY, Disp: 90 tablet, Rfl: 1 .  mometasone (NASONEX) 50 MCG/ACT nasal spray, Place 2 sprays into the nose daily as needed (congestion)., Disp: 17 g, Rfl: 2 .  montelukast (SINGULAIR) 10 MG tablet, TAKE 1 TABLET BY MOUTH EVERYDAY AT BEDTIME, Disp: 90 tablet, Rfl: 3 .  Multiple Vitamin (MULTIVITAMIN WITH MINERALS) TABS tablet, Take 1 tablet by mouth daily., Disp: , Rfl:  .  ONE TOUCH LANCETS MISC, Use to test blood sugar once daily, Disp: 200 each, Rfl: 2 .  RABEprazole (ACIPHEX) 20 MG tablet, TAKE 1 TABLET (20 MG TOTAL) BY MOUTH DAILY. FAILURE OF PANTOPRAZOLE, PREVACID, Disp: 90 tablet, Rfl: 3   Observations/Objective: Blood pressure 120/80, pulse 97, temperature 98.1 F (36.7 C), temperature source Temporal, height 5' 9.75" (1.772 m), weight 188 lb 4 oz (85.4 kg), SpO2 98 %.  Physical Exam Constitutional:      Appearance:  He is well-developed.  HENT:     Head: Normocephalic.     Right Ear: Hearing normal.     Left Ear: Hearing normal.     Nose: Nose normal.  Neck:     Thyroid: No thyroid mass or thyromegaly.     Vascular: No carotid bruit.     Trachea: Trachea normal.  Cardiovascular:     Rate and Rhythm: Normal rate and regular rhythm.     Pulses: Normal pulses.     Heart sounds: Heart sounds not distant. No murmur. No friction rub. No gallop.      Comments: No  peripheral edema Pulmonary:     Effort: Pulmonary effort is normal. No respiratory distress.     Breath sounds: Normal breath sounds.  Musculoskeletal:     Cervical back: He exhibits tenderness. He exhibits normal range of motion and no bony tenderness.  Skin:    General: Skin is warm and dry.     Findings: No rash.  Psychiatric:        Speech: Speech normal.        Behavior: Behavior normal.        Thought Content: Thought content normal.      Assessment and Plan  Current moderate episode of major depressive disorder without prior episode [facility]) He will contact employee assistance to given feedback about work situation and to try to set up counseling through work.  Will start on antidepressant at bedtime... sertraline.. follow up in 4 weeks. Stress reduction, relaxation, provide adequate hours for sleep at night, start exercise. Contracts for safety.  GAD (generalized anxiety disorder)  SRRi as above, close follow up.    Eliezer Lofts, MD

## 2019-03-27 NOTE — Patient Instructions (Addendum)
Work on regular exercise, try to get 6-8 hours of sleep at night.  Work on gentle neck stretching. Aleve is okay to use for headache and neck ache. Contact [employer] at [employer].  Start sertrlaine at bedtime .

## 2019-03-28 DIAGNOSIS — F411 Generalized anxiety disorder: Secondary | ICD-10-CM | POA: Insufficient documentation

## 2019-03-28 DIAGNOSIS — F321 Major depressive disorder, single episode, moderate: Secondary | ICD-10-CM | POA: Insufficient documentation

## 2019-03-28 NOTE — Assessment & Plan Note (Signed)
He will contact employee assistance to given feedback about work situation and to try to set up counseling through work.  Will start on antidepressant at bedtime... sertraline.. follow up in 4 weeks. Stress reduction, relaxation, provide adequate hours for sleep at night, start exercise. Contracts for safety.

## 2019-03-28 NOTE — Assessment & Plan Note (Signed)
SRRi as above, close follow up.

## 2019-03-30 ENCOUNTER — Encounter: Payer: Self-pay | Admitting: *Deleted

## 2019-04-18 ENCOUNTER — Other Ambulatory Visit: Payer: Self-pay | Admitting: Family Medicine

## 2019-04-19 ENCOUNTER — Other Ambulatory Visit: Payer: Self-pay | Admitting: Family Medicine

## 2019-04-24 ENCOUNTER — Telehealth: Payer: Self-pay | Admitting: Family Medicine

## 2019-04-24 NOTE — Telephone Encounter (Signed)
Yes pt needs to be tested and quarantine until result back.

## 2019-04-24 NOTE — Telephone Encounter (Signed)
Paul Flowers notified as instructed by telephone.  Testing sight information given.  Patient states understanding.

## 2019-04-24 NOTE — Telephone Encounter (Signed)
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   Last time He was around her 4pm yesterday. In the office they did not wear mask.  Pt is not having any symptoms  Pt wanted to know if he needed to get tested if so when?  Per Rollene Fare L pt should quarantine please advise

## 2019-04-24 NOTE — Telephone Encounter (Signed)
Left message for Paul Flowers to return my call.

## 2019-04-25 ENCOUNTER — Other Ambulatory Visit: Payer: Self-pay

## 2019-04-25 DIAGNOSIS — Z20822 Contact with and (suspected) exposure to covid-19: Secondary | ICD-10-CM

## 2019-04-25 DIAGNOSIS — Z20828 Contact with and (suspected) exposure to other viral communicable diseases: Secondary | ICD-10-CM | POA: Diagnosis not present

## 2019-04-27 LAB — NOVEL CORONAVIRUS, NAA: SARS-CoV-2, NAA: NOT DETECTED

## 2019-05-25 IMAGING — CT CT RENAL STONE PROTOCOL
2 of 3 series · 16 of 46 positions shown, 18 images · non-contrast
Comparison: 10/29/2014

CLINICAL DATA: Left-sided flank and back pain for 3 days.

EXAM:
CT ABDOMEN AND PELVIS WITHOUT CONTRAST
TECHNIQUE: Multidetector CT imaging of the abdomen and pelvis was performed
following the standard protocol without IV contrast.

[Series 3: lung · axial · 0.71mm/px · z∈[+1393,+1497]mm · 13 of 60 slices shown, 15 images]
[im 4/60  soft-tissue]
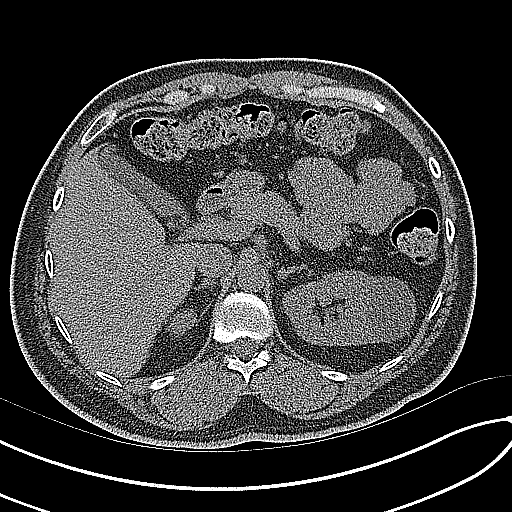
[im 4/60  bone]
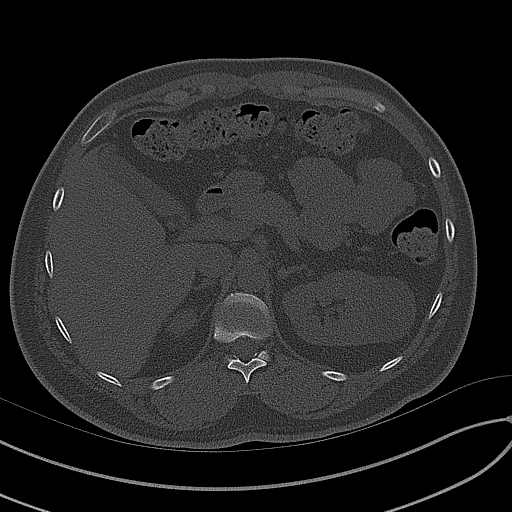
[im 8/60  soft-tissue]
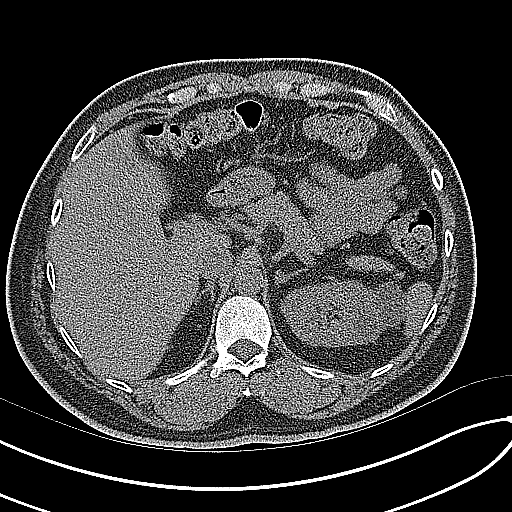
[im 12/60  soft-tissue]
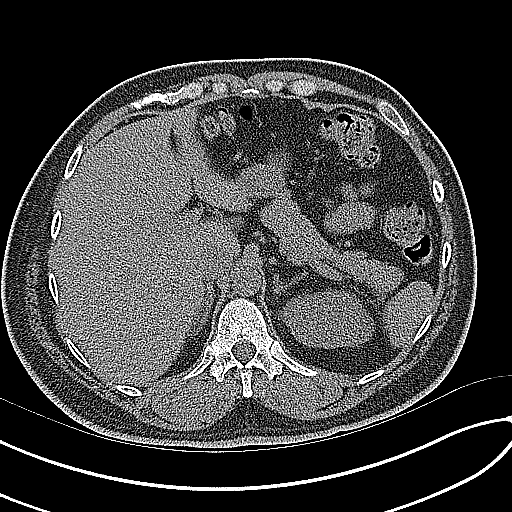
[im 18/60  soft-tissue]
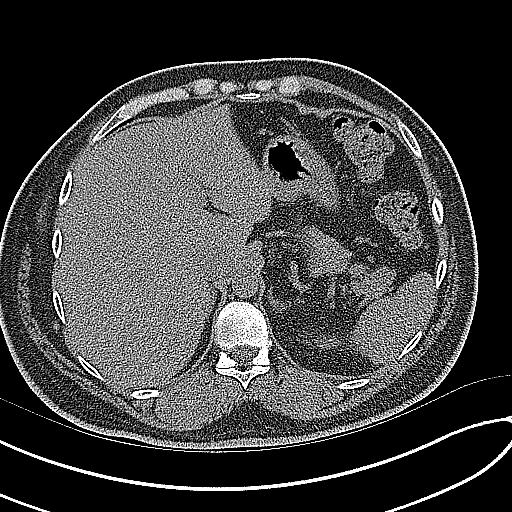
[im 21/60  soft-tissue]
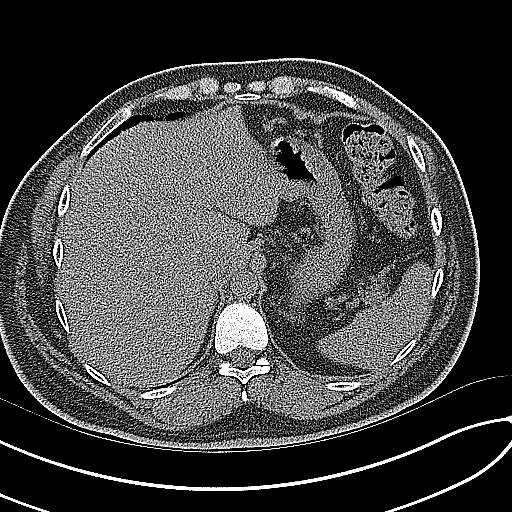
[im 25/60  soft-tissue]
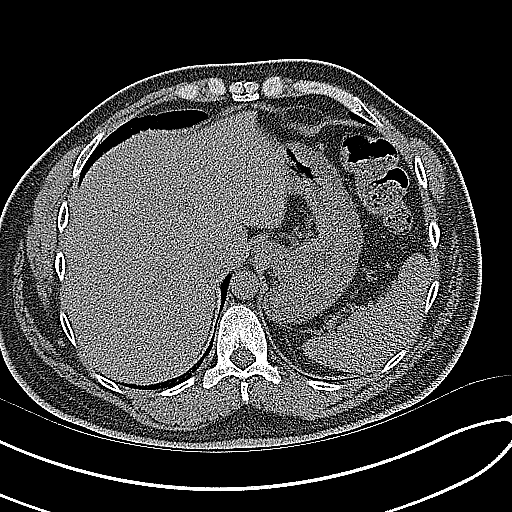
[im 31/60  soft-tissue]
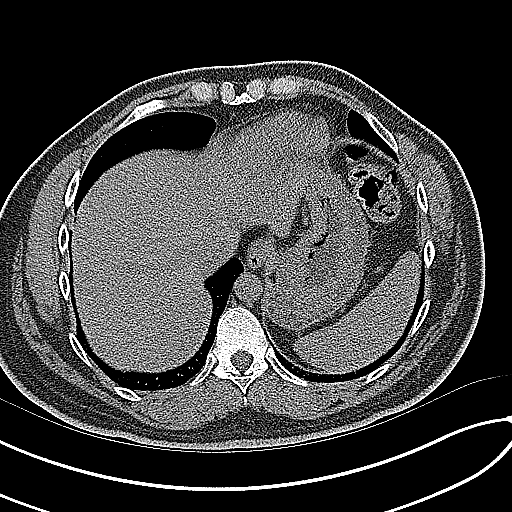
[im 35/60  soft-tissue]
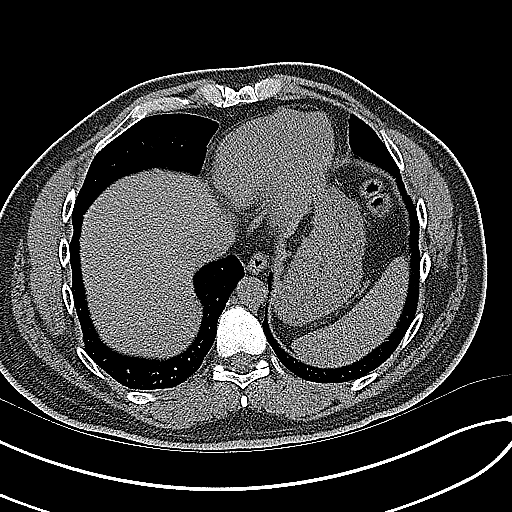
[im 39/60  soft-tissue]
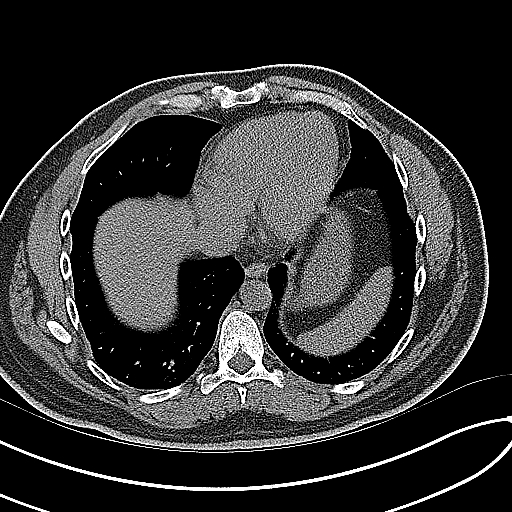
[im 39/60  bone]
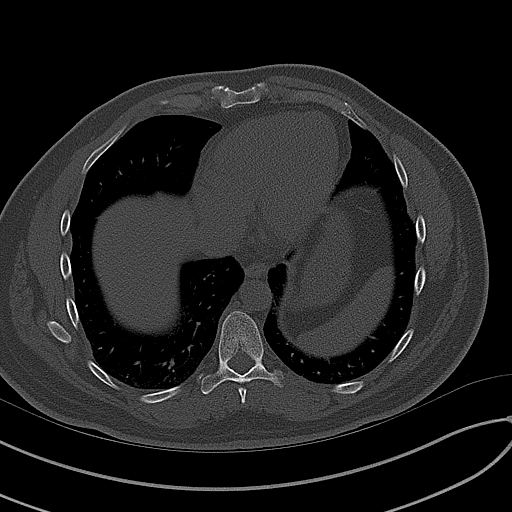
[im 42/60  soft-tissue]
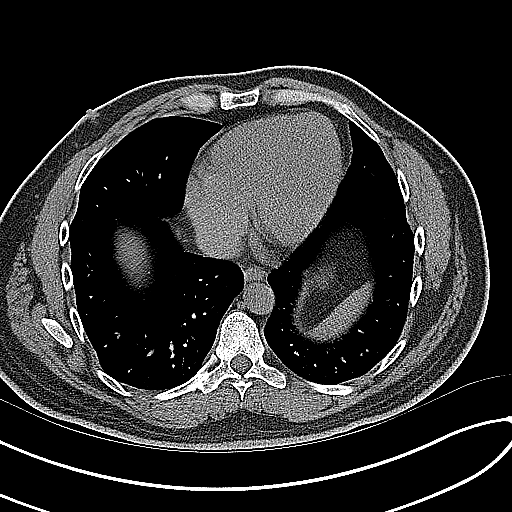
[im 48/60  soft-tissue]
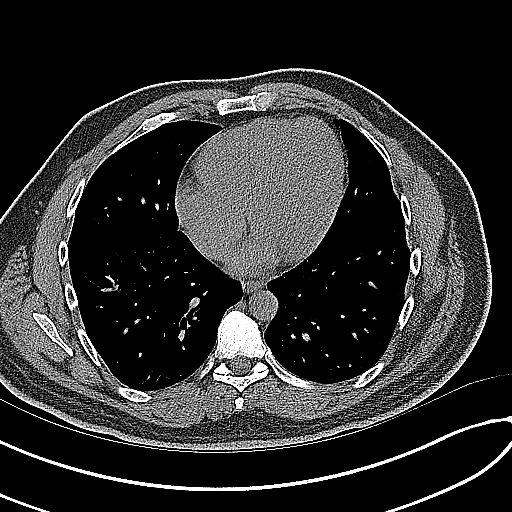
[im 52/60  soft-tissue]
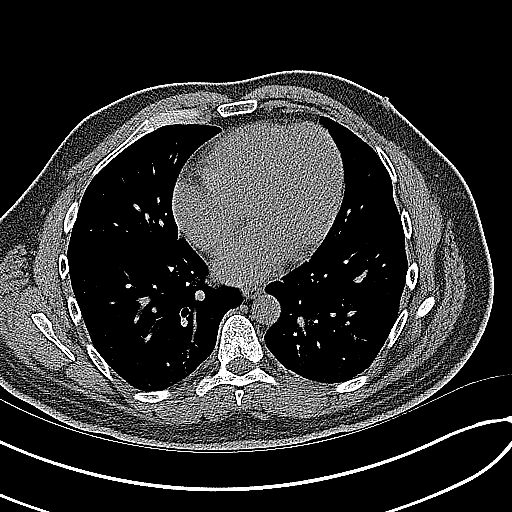
[im 56/60  soft-tissue]
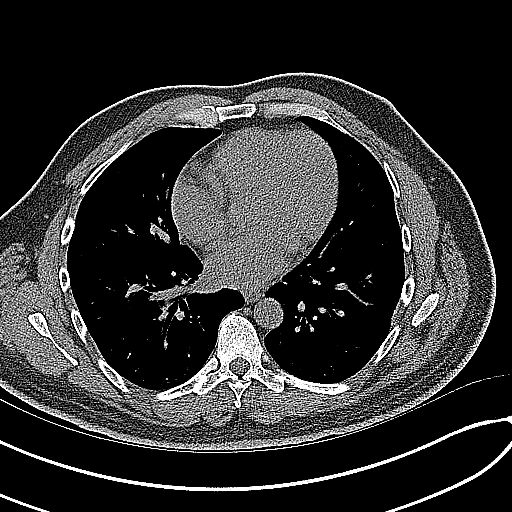

[Series 4: coronal · coronal · 0.79mm/px · 3 of 173 slices shown]
[im 58/173  soft-tissue]
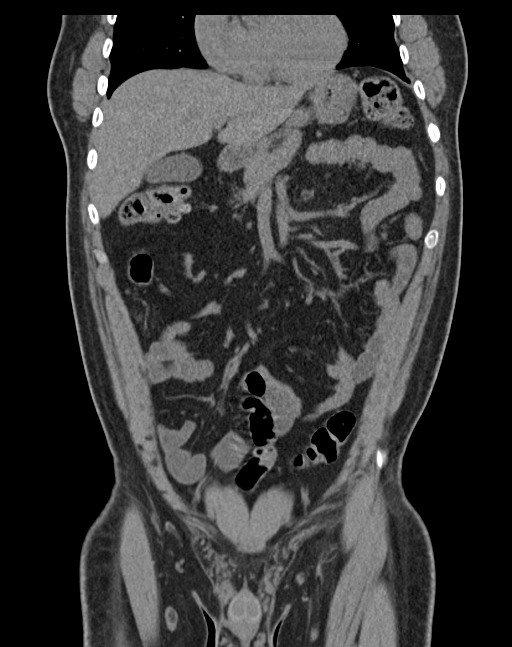
[im 77/173  soft-tissue]
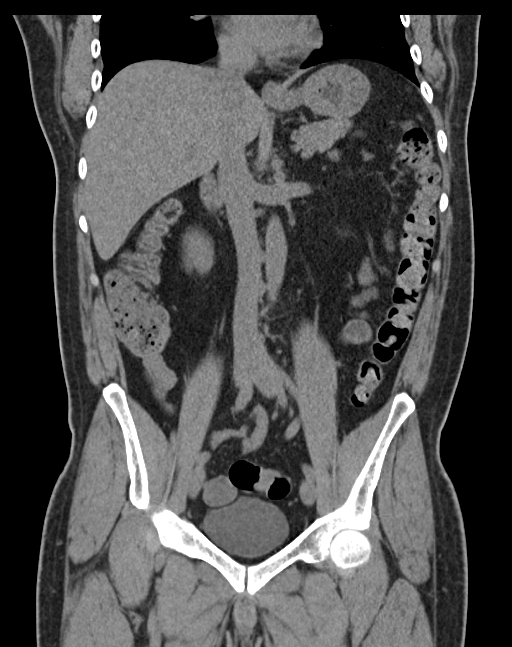
[im 96/173  soft-tissue]
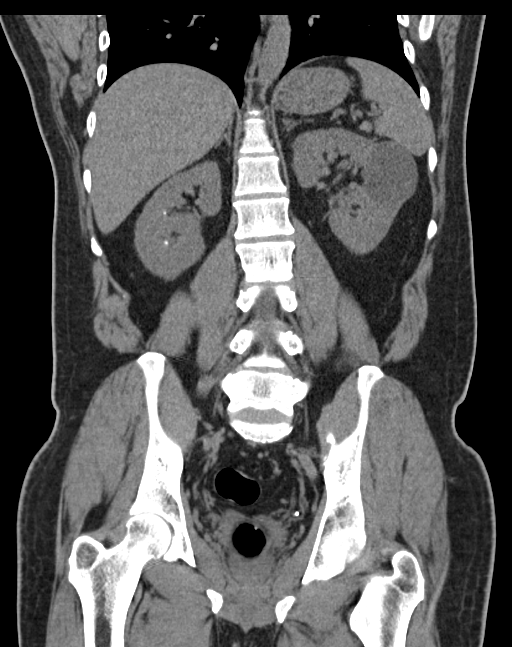

[16 of 46 positions shown; findings below may reference images not displayed]

FINDINGS: Lower chest: No acute findings.

Hepatobiliary: No masses visualized on this unenhanced exam.
Gallbladder is unremarkable.

Pancreas: No mass or inflammatory process visualized on this
unenhanced exam.

Spleen:  Within normal limits in size.

Adrenals/Urinary tract: Several tiny 2-3 mm nonobstructing calculi
are seen within the right kidney . No evidence of ureteral calculi
or hydronephrosis. A few left renal cysts are again noted.
Unremarkable unopacified urinary bladder.

Stomach/Bowel: No evidence of obstruction, inflammatory process, or
abnormal fluid collections. Normal appendix visualized.

Vascular/Lymphatic: No pathologically enlarged lymph nodes
identified. No evidence of abdominal aortic aneurysm.

Reproductive:  No mass or other significant abnormality.

Other:  None.

Musculoskeletal:  No suspicious bone lesions identified.
IMPRESSION: Nonobstructing right nephrolithiasis. No evidence of ureteral
calculi, hydronephrosis, or other acute findings.

## 2019-07-27 ENCOUNTER — Other Ambulatory Visit: Payer: Self-pay | Admitting: Endocrinology

## 2019-07-31 ENCOUNTER — Other Ambulatory Visit (INDEPENDENT_AMBULATORY_CARE_PROVIDER_SITE_OTHER): Payer: Federal, State, Local not specified - PPO

## 2019-07-31 ENCOUNTER — Other Ambulatory Visit: Payer: Self-pay

## 2019-07-31 DIAGNOSIS — E1165 Type 2 diabetes mellitus with hyperglycemia: Secondary | ICD-10-CM | POA: Diagnosis not present

## 2019-07-31 LAB — BASIC METABOLIC PANEL
BUN: 8 mg/dL (ref 6–23)
CO2: 29 mEq/L (ref 19–32)
Calcium: 9.4 mg/dL (ref 8.4–10.5)
Chloride: 102 mEq/L (ref 96–112)
Creatinine, Ser: 0.8 mg/dL (ref 0.40–1.50)
GFR: 123.59 mL/min (ref >60.00)
Glucose, Bld: 96 mg/dL (ref 70–99)
Potassium: 4.1 mEq/L (ref 3.5–5.1)
Sodium: 138 mEq/L (ref 135–145)

## 2019-07-31 LAB — HEMOGLOBIN A1C: Hgb A1c MFr Bld: 6.2 % (ref 4.6–6.5)

## 2019-08-03 ENCOUNTER — Encounter: Payer: Self-pay | Admitting: Endocrinology

## 2019-08-03 ENCOUNTER — Ambulatory Visit: Payer: Federal, State, Local not specified - PPO | Admitting: Endocrinology

## 2019-08-03 ENCOUNTER — Other Ambulatory Visit: Payer: Self-pay

## 2019-08-03 VITALS — BP 116/78 | HR 97 | Ht 69.75 in | Wt 187.0 lb

## 2019-08-03 DIAGNOSIS — E782 Mixed hyperlipidemia: Secondary | ICD-10-CM

## 2019-08-03 DIAGNOSIS — E119 Type 2 diabetes mellitus without complications: Secondary | ICD-10-CM | POA: Diagnosis not present

## 2019-08-03 DIAGNOSIS — E1165 Type 2 diabetes mellitus with hyperglycemia: Secondary | ICD-10-CM | POA: Diagnosis not present

## 2019-08-03 LAB — GLUCOSE, POCT (MANUAL RESULT ENTRY): POC Glucose: 125 mg/dl — AB (ref 70–99)

## 2019-08-03 NOTE — Progress Notes (Addendum)
Patient ID: Paul Flowers, male   DOB: 11/22/1970, 49 y.o.   MRN: [id number]            Reason for Appointment: Follow-up of diabetes  Referring physician: Diona Browner    History of Present Illness:          Date of diagnosis of type 2 diabetes mellitus: 12/17        Background history:   He had been told to have prediabetes about 2 years ago with highest A1c only 6.5 but no significant hyperglycemia He had not been following any particular diet or exercising and his A1c initially came down to 6.1 However in 12/17 his A1c was up to 6.8 In the third week of January 2018 he started having fatigue, severely increased thirst, frequent urination and was found to have marked hyperglycemia with blood sugar of 620 without Acidosis , had small ketones in the urine  He was switched from metformin to Janumet XR on his initial consultation in 08/2016 because of diarrhea with metformin  Recent history:   Non-insulin hypoglycemic drugs the patient is taking are: Janumet XR 100/1000 daily   His A1c is 6.2 compared to 6.3 last year    Current management, blood sugar patterns and problems identified:  He has not been seen in follow-up since 4/20  He did not bring his monitor for download  On his last visit he has been going off his diet and blood sugars are higher  His diet is generally fairly good  He says he walks between 7000-10,000 steps a day with his work as before, usually working long hours  No side effects with Janumet and has not needed a change in his medications since 2018 His lab glucose was 96 fasting and today 125       Side effects from medications have been: diarrhea from metformin  Glucose monitoring:  done 0.5 times a day         Glucometer:  One Touch Verio  Blood Glucose readings by time of day by recall, patient did not have his meter  Fasting readings by recall are usually in the 90s and usually not over 140 after meals at home   Self-care: The diet that the  patient has been following is: tries to limit  Carbohydrates, sweets and drinks with sugar.     Meal times are:  Breakfast is at  7 AM Lunch: at work during [employer]: 8 pm                Dietician visit, most recent: 10/2016               Exercise:  doing walking at work, no formal exercise  Weight history:  Wt Readings from Last 3 Encounters:  08/03/19 187 lb (84.8 kg)  03/27/19 188 lb 4 oz (85.4 kg)  01/19/19 186 lb 9.6 oz (84.6 kg)    Glycemic control:   Lab Results  Component Value Date   HGBA1C 6.2 07/31/2019   HGBA1C 6.3 01/12/2019   HGBA1C 6.8 (H) 10/06/2018   Lab Results  Component Value Date   MICROALBUR <0.7 04/07/2018   LDLCALC 69 10/06/2018   CREATININE 0.80 07/31/2019   Lab Results  Component Value Date   MICRALBCREAT 1.4 04/07/2018    Lab Results  Component Value Date   FRUCTOSAMINE 277 09/15/2016     Allergies as of 08/03/2019   No Known Allergies     Medication List  Accurate as of August 03, 2019 11:05 AM. If you have any questions, ask your nurse or doctor.        atorvastatin 20 MG tablet Commonly known as: LIPITOR TAKE 1 TABLET BY MOUTH EVERY DAY   glucose blood test strip Commonly known as: OneTouch Verio Use to test blood sugar once daily   Janumet XR [phone] MG Tb24 Generic drug: SitaGLIPtin-MetFORMIN HCl TAKE 1 TABLET BY MOUTH DAILY   losartan 25 MG tablet Commonly known as: COZAAR TAKE 1 TABLET BY MOUTH EVERY DAY   mometasone 50 MCG/ACT nasal spray Commonly known as: NASONEX Place 2 sprays into the nose daily as needed (congestion).   montelukast 10 MG tablet Commonly known as: SINGULAIR TAKE 1 TABLET BY MOUTH EVERYDAY AT BEDTIME   multivitamin with minerals Tabs tablet Take 1 tablet by mouth daily.   ONE TOUCH LANCETS Misc Use to test blood sugar once daily   RABEprazole 20 MG tablet Commonly known as: ACIPHEX TAKE 1 TABLET (20 MG TOTAL) BY MOUTH DAILY. FAILURE OF PANTOPRAZOLE, PREVACID     sertraline 50 MG tablet Commonly known as: ZOLOFT TAKE 1 TABLET BY MOUTH EVERY DAY       Allergies: No Known Allergies  Past Medical History:  Diagnosis Date  . Allergic rhinitis   . ALLERGIC RHINITIS 01/19/2007  . Diabetes ([place])    diet controlled  . GERD 01/19/2007  . GERD (gastroesophageal reflux disease)   . Hypertension    Slight  . Sleep apnea 01/29/2011    Past Surgical History:  Procedure Laterality Date  . CYSTOSCOPY/URETEROSCOPY/HOLMIUM LASER/STENT PLACEMENT Right 02/02/2018   Procedure: CYSTOSCOPY/URETEROSCOPY//STENT PLACEMENT;  Surgeon: Cleon Gustin, MD;  Location: [facility];  Service: Urology;  Laterality: Right;  . LEFT HEART CATHETERIZATION WITH CORONARY ANGIOGRAM N/A 12/11/2013   Procedure: LEFT HEART CATHETERIZATION WITH CORONARY ANGIOGRAM;  Surgeon: Josue Hector, MD;  Location: [facility] CATH LAB;  Service: Cardiovascular;  Laterality: N/A;    Family History  Problem Relation Age of Onset  . Hypertension Mother   . Heart disease Father 63  . Hyperlipidemia Father   . Colonic polyp Father   . Diabetes Father   . Diabetes Maternal Grandmother   . Heart disease Maternal Grandmother   . Stroke Maternal Grandfather   . Colon cancer Neg Hx     Social History:  reports that he has never smoked. He has never used smokeless tobacco. He reports current alcohol use. He reports that he does not use drugs.   Review of Systems   Lipid history: He has been  taking Lipitor 20 mg daily for baseline LDL of 141 and long-standing history of hypercholesterolemia  Last results are as follows:   Lab Results  Component Value Date   CHOL 135 10/06/2018   HDL 51.90 10/06/2018   LDLCALC 69 10/06/2018   LDLDIRECT 149.3 02/12/2013   TRIG 68.0 10/06/2018   CHOLHDL 3 10/06/2018           Hypertension:  treated with  25 mg losartan, prescribed by PCP   BP Readings from Last 3 Encounters:  08/03/19 116/78  03/27/19 120/80  01/19/19 128/88     Most recent eye exam  was In 08/2018, negative    Physical Examination:  BP 116/78 (BP Location: Left Arm, Patient Position: Sitting, Cuff Size: Normal)   Pulse 97   Ht 5' 9.75" (1.772 m)   Wt 187 lb (84.8 kg)   SpO2 98%   BMI 27.02 kg/m  ASSESSMENT:  Diabetes type 2, Nonobese  See history of present illness for detailed discussion of current diabetes management, blood sugar patterns and problems identified   His A1c is excellent at 6.2, on his last visit in 9/20 was higher at 6.8  He is continuing on Janumet XR 100/1000 1 tablet daily for 3 years Usually when he is consistent with his diet his blood sugars are controlled Also usually walking a lot at work although not doing much formal exercise Weight is stable Probably not taking enough readings after meals and did not bring his monitor for download  LIPIDS: LDL needs to be rechecked again  Mild hypertension: Well-controlled  PLAN:    He will continue Janumet XR 100/1000 which is being well-tolerated Discussed blood sugar targets both fasting and after meals To call if blood sugars are usually high  Follow-up in 6 months   There are no Patient Instructions on file for this visit.    Elayne Snare 08/03/2019, 11:05 AM   Note: This office note was prepared with Dragon voice recognition system technology. Any transcriptional errors that result from this process are unintentional.

## 2019-08-03 NOTE — Patient Instructions (Signed)
Check blood sugars on waking up 1-2 days a week  Also check blood sugars about 2 hours after meals and do this after different meals by rotation  Recommended blood sugar levels on waking up are 80-130 and about 2 hours after meal is 130-160  Please bring your blood sugar monitor to each visit, thank you  [facility] at [facility]

## 2019-08-17 ENCOUNTER — Other Ambulatory Visit: Payer: Self-pay | Admitting: Family Medicine

## 2019-08-25 ENCOUNTER — Other Ambulatory Visit: Payer: Self-pay | Admitting: Endocrinology

## 2019-09-15 ENCOUNTER — Other Ambulatory Visit: Payer: Self-pay

## 2019-09-15 ENCOUNTER — Ambulatory Visit: Payer: Federal, State, Local not specified - PPO | Attending: Internal Medicine

## 2019-09-15 DIAGNOSIS — Z23 Encounter for immunization: Secondary | ICD-10-CM

## 2019-09-15 NOTE — Progress Notes (Signed)
   Covid-19 Vaccination Clinic  Name:  Paul Flowers    MRN: [id number] DOB: 03-31-1971  09/15/2019  Paul Flowers was observed post Covid-19 immunization for 15 minutes without incident. He was provided with Vaccine Information Sheet and instruction to access the V-Safe system.   Paul Flowers was instructed to call 911 with any severe reactions post vaccine: Marland Kitchen Difficulty breathing  . Swelling of face and throat  . A fast heartbeat  . A bad rash all over body  . Dizziness and weakness   Immunizations Administered    Name Date Dose VIS Date Route   Pfizer COVID-19 Vaccine 09/15/2019 10:13 AM 0.3 mL 06/15/2019 Intramuscular   Manufacturer: [place]   Lot: [id number]   [place]: [id number]

## 2019-09-26 LAB — HM DIABETES EYE EXAM

## 2019-09-28 ENCOUNTER — Encounter: Payer: Self-pay | Admitting: Family Medicine

## 2019-10-10 ENCOUNTER — Ambulatory Visit: Payer: Federal, State, Local not specified - PPO | Attending: Internal Medicine

## 2019-10-10 DIAGNOSIS — Z23 Encounter for immunization: Secondary | ICD-10-CM

## 2019-10-10 NOTE — Progress Notes (Signed)
   Covid-19 Vaccination Clinic  Name:  TELVIS GORSUCH    MRN: [id number] DOB: 07/13/1970  10/10/2019  Mr. Barbie was observed post Covid-19 immunization for 15 minutes without incident. He was provided with Vaccine Information Sheet and instruction to access the V-Safe system.   Mr. Schantz was instructed to call 911 with any severe reactions post vaccine: Marland Kitchen Difficulty breathing  . Swelling of face and throat  . A fast heartbeat  . A bad rash all over body  . Dizziness and weakness   Immunizations Administered    Name Date Dose VIS Date Route   Pfizer COVID-19 Vaccine 10/10/2019  4:22 PM 0.3 mL 06/15/2019 Intramuscular   Manufacturer: [place]   Lot: [id number]   [place]: [phone]

## 2019-12-15 ENCOUNTER — Other Ambulatory Visit: Payer: Self-pay | Admitting: Family Medicine

## 2019-12-16 ENCOUNTER — Other Ambulatory Visit: Payer: Self-pay | Admitting: Family Medicine

## 2020-01-17 ENCOUNTER — Telehealth: Payer: Self-pay | Admitting: Family Medicine

## 2020-01-17 DIAGNOSIS — Z125 Encounter for screening for malignant neoplasm of prostate: Secondary | ICD-10-CM

## 2020-01-17 DIAGNOSIS — E119 Type 2 diabetes mellitus without complications: Secondary | ICD-10-CM

## 2020-01-17 NOTE — Telephone Encounter (Signed)
-----   Message from Ellamae Sia sent at 01/01/2020  3:31 PM EDT ----- Regarding: Lab orders for Friday, 7.16.21 Patient is scheduled for CPX labs, please order future labs, Thanks , Karna Christmas

## 2020-01-17 NOTE — Telephone Encounter (Signed)
error 

## 2020-01-18 ENCOUNTER — Other Ambulatory Visit (INDEPENDENT_AMBULATORY_CARE_PROVIDER_SITE_OTHER): Payer: Federal, State, Local not specified - PPO

## 2020-01-18 ENCOUNTER — Other Ambulatory Visit: Payer: Self-pay

## 2020-01-18 DIAGNOSIS — E119 Type 2 diabetes mellitus without complications: Secondary | ICD-10-CM

## 2020-01-18 DIAGNOSIS — Z125 Encounter for screening for malignant neoplasm of prostate: Secondary | ICD-10-CM | POA: Diagnosis not present

## 2020-01-18 LAB — LIPID PANEL
Cholesterol: 123 mg/dL (ref 0–200)
HDL: 55.8 mg/dL (ref >39.00)
LDL Cholesterol: 56 mg/dL (ref 0–99)
NonHDL: 67.47
Total CHOL/HDL Ratio: 2
Triglycerides: 55 mg/dL (ref 0.0–149.0)
VLDL: 11 mg/dL (ref 0.0–40.0)

## 2020-01-18 LAB — COMPREHENSIVE METABOLIC PANEL
ALT: 17 U/L (ref 0–53)
AST: 15 U/L (ref 0–37)
Albumin: 4.4 g/dL (ref 3.5–5.2)
Alkaline Phosphatase: 68 U/L (ref 39–117)
BUN: 8 mg/dL (ref 6–23)
CO2: 27 mEq/L (ref 19–32)
Calcium: 9.5 mg/dL (ref 8.4–10.5)
Chloride: 103 mEq/L (ref 96–112)
Creatinine, Ser: 0.79 mg/dL (ref 0.40–1.50)
GFR: 125.16 mL/min (ref >60.00)
Glucose, Bld: 70 mg/dL (ref 70–99)
Potassium: 3.7 mEq/L (ref 3.5–5.1)
Sodium: 139 mEq/L (ref 135–145)
Total Bilirubin: 1 mg/dL (ref 0.2–1.2)
Total Protein: 7 g/dL (ref 6.0–8.3)

## 2020-01-18 LAB — HEMOGLOBIN A1C: Hgb A1c MFr Bld: 6.3 % (ref 4.6–6.5)

## 2020-01-18 LAB — PSA: PSA: 0.32 ng/mL (ref 0.10–4.00)

## 2020-01-18 NOTE — Progress Notes (Signed)
No critical labs need to be addressed urgently. We will discuss labs in detail at upcoming office visit.   

## 2020-01-21 DIAGNOSIS — G4733 Obstructive sleep apnea (adult) (pediatric): Secondary | ICD-10-CM | POA: Diagnosis not present

## 2020-01-22 ENCOUNTER — Ambulatory Visit (INDEPENDENT_AMBULATORY_CARE_PROVIDER_SITE_OTHER): Payer: Federal, State, Local not specified - PPO | Admitting: Family Medicine

## 2020-01-22 ENCOUNTER — Other Ambulatory Visit: Payer: Self-pay

## 2020-01-22 ENCOUNTER — Encounter: Payer: Self-pay | Admitting: Family Medicine

## 2020-01-22 VITALS — BP 112/70 | HR 93 | Temp 98.2°F | Ht 69.5 in | Wt 187.0 lb

## 2020-01-22 DIAGNOSIS — E1159 Type 2 diabetes mellitus with other circulatory complications: Secondary | ICD-10-CM

## 2020-01-22 DIAGNOSIS — Z1211 Encounter for screening for malignant neoplasm of colon: Secondary | ICD-10-CM

## 2020-01-22 DIAGNOSIS — E785 Hyperlipidemia, unspecified: Secondary | ICD-10-CM

## 2020-01-22 DIAGNOSIS — E119 Type 2 diabetes mellitus without complications: Secondary | ICD-10-CM | POA: Diagnosis not present

## 2020-01-22 DIAGNOSIS — G4733 Obstructive sleep apnea (adult) (pediatric): Secondary | ICD-10-CM

## 2020-01-22 DIAGNOSIS — Z0000 Encounter for general adult medical examination without abnormal findings: Secondary | ICD-10-CM | POA: Diagnosis not present

## 2020-01-22 DIAGNOSIS — E1169 Type 2 diabetes mellitus with other specified complication: Secondary | ICD-10-CM | POA: Diagnosis not present

## 2020-01-22 DIAGNOSIS — I10 Essential (primary) hypertension: Secondary | ICD-10-CM

## 2020-01-22 DIAGNOSIS — I152 Hypertension secondary to endocrine disorders: Secondary | ICD-10-CM

## 2020-01-22 MED ORDER — SITAGLIP PHOS-METFORMIN HCL ER 50-1000 MG PO TB24: ORAL_TABLET | ORAL | 11 refills | Status: DC

## 2020-01-22 NOTE — Assessment & Plan Note (Signed)
Well controlled. Continue current medication.  

## 2020-01-22 NOTE — Patient Instructions (Addendum)
Decrease Janumet.. follow blood sugars at home daily until we establish control .  Start home treatment for plantar fasciitis... stretching, ice massage.      Plantar Fasciitis  Plantar fasciitis is a painful foot condition that affects the heel. It occurs when the band of tissue that connects the toes to the heel bone (plantar fascia) becomes irritated. This can happen as the result of exercising too much or doing other repetitive activities (overuse injury). The pain from plantar fasciitis can range from mild irritation to severe pain that makes it difficult to walk or move. The pain is usually worse in the morning after sleeping, or after sitting or lying down for a while. Pain may also be worse after long periods of walking or standing. What are the causes? This condition may be caused by:  Standing for long periods of time.  Wearing shoes that do not have good arch support.  Doing activities that put stress on joints (high-impact activities), including running, aerobics, and ballet.  Being overweight.  An abnormal way of walking (gait).  Tight muscles in the back of your lower leg (calf).  High arches in your feet.  Starting a new athletic activity. What are the signs or symptoms? The main symptom of this condition is heel pain. Pain may:  Be worse with first steps after a time of rest, especially in the morning after sleeping or after you have been sitting or lying down for a while.  Be worse after long periods of standing still.  Decrease after 30-45 minutes of activity, such as gentle walking. How is this diagnosed? This condition may be diagnosed based on your medical history and your symptoms. Your health care provider may ask questions about your activity level. Your health care provider will do a physical exam to check for:  A tender area on the bottom of your foot.  A high arch in your foot.  Pain when you move your foot.  Difficulty moving your foot. You  may have imaging tests to confirm the diagnosis, such as:  X-rays.  Ultrasound.  MRI. How is this treated? Treatment for plantar fasciitis depends on how severe your condition is. Treatment may include:  Rest, ice, applying pressure (compression), and raising the affected foot (elevation). This may be called RICE therapy. Your health care provider may recommend RICE therapy along with over-the-counter pain medicines to manage your pain.  Exercises to stretch your calves and your plantar fascia.  A splint that holds your foot in a stretched, upward position while you sleep (night splint).  Physical therapy to relieve symptoms and prevent problems in the future.  Injections of steroid medicine (cortisone) to relieve pain and inflammation.  Stimulating your plantar fascia with electrical impulses (extracorporeal shock wave therapy). This is usually the last treatment option before surgery.  Surgery, if other treatments have not worked after 12 months. Follow these instructions at home:  Managing pain, stiffness, and swelling  If directed, put ice on the painful area: ? Put ice in a plastic bag, or use a frozen bottle of water. ? Place a towel between your skin and the bag or bottle. ? Roll the bottom of your foot over the bag or bottle. ? Do this for 20 minutes, 2-3 times a day.  Wear athletic shoes that have air-sole or gel-sole cushions, or try wearing soft shoe inserts that are designed for plantar fasciitis.  Raise (elevate) your foot above the level of your heart while you are sitting or lying  down. Activity  Avoid activities that cause pain. Ask your health care provider what activities are safe for you.  Do physical therapy exercises and stretches as told by your health care provider.  Try activities and forms of exercise that are easier on your joints (low-impact). Examples include swimming, water aerobics, and biking. General instructions  Take over-the-counter and  prescription medicines only as told by your health care provider.  Wear a night splint while sleeping, if told by your health care provider. Loosen the splint if your toes tingle, become numb, or turn cold and blue.  Maintain a healthy weight, or work with your health care provider to lose weight as needed.  Keep all follow-up visits as told by your health care provider. This is important. Contact a health care provider if you:  Have symptoms that do not go away after caring for yourself at home.  Have pain that gets worse.  Have pain that affects your ability to move or do your daily activities. Summary  Plantar fasciitis is a painful foot condition that affects the heel. It occurs when the band of tissue that connects the toes to the heel bone (plantar fascia) becomes irritated.  The main symptom of this condition is heel pain that may be worse after exercising too much or standing still for a long time.  Treatment varies, but it usually starts with rest, ice, compression, and elevation (RICE therapy) and over-the-counter medicines to manage pain. This information is not intended to replace advice given to you by your health care provider. Make sure you discuss any questions you have with your health care provider. Document Revised: 06/03/2017 Document Reviewed: 04/18/2017 Elsevier Patient Education  2020 [employer].

## 2020-01-22 NOTE — Assessment & Plan Note (Signed)
Pending sleep eval.

## 2020-01-22 NOTE — Progress Notes (Signed)
Chief Complaint  Patient presents with  . Annual Exam    History of Present Illness: HPI  The patient is here for annual wellness exam and preventative care.    Diabetes:  At goal on Janumet Lab Results  Component Value Date   HGBA1C 6.3 01/18/2020  Using medications without difficulties: Hypoglycemic episodes:occ.. every couple eweeks Hyperglycemic episodes: none Feet problems: ache in morning, no burning no numbness Blood Sugars averaging: FBS 98 eye exam within last year: yes  Elevated Cholesterol: LDL at goal < 100 on atorvastatin. Lab Results  Component Value Date   CHOL 123 01/18/2020   HDL 55.80 01/18/2020   LDLCALC 56 01/18/2020   LDLDIRECT 149.3 02/12/2013   TRIG 55.0 01/18/2020   CHOLHDL 2 01/18/2020  Using medications without problems: Muscle aches:  Diet compliance: Exercise: Other complaints:  Hypertension:   Well controlled on ARB BP Readings from Last 3 Encounters:  01/22/20 112/70  08/03/19 116/78  03/27/19 120/80  Using medication without problems or lightheadedness:  None Chest pain with exertion:none Edema:none Short of breath:none Average home BPs:  OSA: trying  CPAP again through ENT.Marland Kitchen planning repeat sleep study.  MDD : No longer on sertraline.. he was only able to take 3 days.. SE.  He has delegated better at work. Managing stress better in general.  PHQ9 today 1.  PHQ9 SCORE ONLY 03/27/2019 01/19/2019 01/13/2018  PHQ-9 Total Score 19 0 0      This visit occurred during the SARS-CoV-2 public health emergency.  Safety protocols were in place, including screening questions prior to the visit, additional usage of staff PPE, and extensive cleaning of exam room while observing appropriate contact time as indicated for disinfecting solutions.   COVID 19 screen:  No recent travel or known exposure to COVID19 The patient denies respiratory symptoms of COVID 19 at this time. The importance of social distancing was discussed today.      Review of Systems  Constitutional: Negative for chills and fever.  HENT: Negative for congestion and ear pain.   Eyes: Negative for pain and redness.  Respiratory: Negative for cough and shortness of breath.   Cardiovascular: Negative for chest pain, palpitations and leg swelling.  Gastrointestinal: Negative for abdominal pain, blood in stool, constipation, diarrhea, nausea and vomiting.  Genitourinary: Negative for dysuria.  Musculoskeletal: Negative for falls and myalgias.  Skin: Negative for rash.  Neurological: Negative for dizziness.  Psychiatric/Behavioral: Negative for depression. The patient is not nervous/anxious.       Past Medical History:  Diagnosis Date  . Allergic rhinitis   . ALLERGIC RHINITIS 01/19/2007  . Diabetes ([place])    diet controlled  . GERD 01/19/2007  . GERD (gastroesophageal reflux disease)   . Hypertension    Slight  . Sleep apnea 01/29/2011    reports that he has never smoked. He has never used smokeless tobacco. He reports current alcohol use. He reports that he does not use drugs.   Current Outpatient Medications:  .  atorvastatin (LIPITOR) 20 MG tablet, TAKE 1 TABLET BY MOUTH EVERY DAY, Disp: 90 tablet, Rfl: 0 .  glucose blood (ONETOUCH VERIO) test strip, Use to test blood sugar once daily, Disp: 100 each, Rfl: 5 .  JANUMET XR [phone] MG TB24, TAKE 1 TABLET BY MOUTH EVERY DAY, Disp: 90 tablet, Rfl: 1 .  losartan (COZAAR) 25 MG tablet, TAKE 1 TABLET BY MOUTH EVERY DAY, Disp: 90 tablet, Rfl: 0 .  mometasone (NASONEX) 50 MCG/ACT nasal spray, Place 2 sprays into the nose  daily as needed (congestion)., Disp: 17 g, Rfl: 2 .  montelukast (SINGULAIR) 10 MG tablet, TAKE 1 TABLET BY MOUTH EVERYDAY AT BEDTIME, Disp: 90 tablet, Rfl: 3 .  Multiple Vitamin (MULTIVITAMIN WITH MINERALS) TABS tablet, Take 1 tablet by mouth daily., Disp: , Rfl:  .  ONE TOUCH LANCETS MISC, Use to test blood sugar once daily, Disp: 200 each, Rfl: 2 .  RABEprazole (ACIPHEX) 20 MG  tablet, TAKE 1 TABLET (20 MG TOTAL) BY MOUTH DAILY. FAILURE OF PANTOPRAZOLE, PREVACID, Disp: 90 tablet, Rfl: 3   Observations/Objective: Blood pressure 112/70, pulse 93, temperature 98.2 F (36.8 C), temperature source Temporal, height 5' 9.5" (1.765 m), weight 187 lb (84.8 kg), SpO2 96 %.  Physical Exam Constitutional:      Appearance: He is well-developed.  HENT:     Head: Normocephalic.     Right Ear: Hearing normal.     Left Ear: Hearing normal.     Nose: Nose normal.  Neck:     Thyroid: No thyroid mass or thyromegaly.     Vascular: No carotid bruit.     Trachea: Trachea normal.  Cardiovascular:     Rate and Rhythm: Normal rate and regular rhythm.     Pulses: Normal pulses.     Heart sounds: Heart sounds not distant. No murmur heard.  No friction rub. No gallop.      Comments: No peripheral edema Pulmonary:     Effort: Pulmonary effort is normal. No respiratory distress.     Breath sounds: Normal breath sounds.  Skin:    General: Skin is warm and dry.     Findings: No rash.  Psychiatric:        Speech: Speech normal.        Behavior: Behavior normal.        Thought Content: Thought content normal.     Diabetic foot exam: Normal inspection No skin breakdown No calluses  Normal DP pulses Normal sensation to light touch and monofilament Nails normal  Assessment and Plan   The patient's preventative maintenance and recommended screening tests for an annual wellness exam were reviewed in full today. Brought up to date unless services declined.  Counselled on the importance of diet, exercise, and its role in overall health and mortality. The patient's FH and SH was reviewed, including their home life, tobacco status, and drug and alcohol status.   Colon cancer: none, no family history... plan colonoscopy HIV screen:Refused Nonsmoker Vaccines: Uptodate, s/P [id number] PSA started age 27 given AA..  Lab Results  Component Value Date   PSA 0.32 01/18/2020   PSA  0.32 01/12/2019   PSA 0.35 01/13/2018   Hyperlipidemia associated with type 2 diabetes mellitus ([place]) Well controlled. Continue current medication.   Controlled type 2 diabetes mellitus without complication, without long-term current use of insulin ([place]) Well controlled. Continue current medication.   OSA (obstructive sleep apnea) Pending sleep eval.  Hypertension associated with diabetes ([place]) Well controlled. Continue current medication.        Eliezer Lofts, MD

## 2020-01-24 ENCOUNTER — Telehealth (INDEPENDENT_AMBULATORY_CARE_PROVIDER_SITE_OTHER): Payer: Self-pay | Admitting: Gastroenterology

## 2020-01-24 ENCOUNTER — Other Ambulatory Visit: Payer: Self-pay

## 2020-01-24 DIAGNOSIS — Z1211 Encounter for screening for malignant neoplasm of colon: Secondary | ICD-10-CM

## 2020-01-24 MED ORDER — PEG 3350-KCL-NA BICARB-NACL 420 G PO SOLR
4000.0000 mL | Freq: Once | ORAL | 0 refills | Status: AC
Start: 2020-01-24 — End: 2020-01-24

## 2020-01-24 NOTE — Progress Notes (Signed)
Gastroenterology Pre-Procedure Review  Request Date: Monday 02/25/20 Requesting Physician: Dr. Vicente Males  PATIENT REVIEW QUESTIONS: The patient responded to the following health history questions as indicated:    1. Are you having any GI issues? no 2. Do you have a personal history of Polyps? no 3. Do you have a family history of Colon Cancer or Polyps? yes (father had colon polyps) 4. Diabetes Mellitus? yes (type 2 takes oral meds) 5. Joint replacements in the past 12 months?no 6. Major health problems in the past 3 months?no 7. Any artificial heart valves, MVP, or defibrillator?no    MEDICATIONS & ALLERGIES:    Patient reports the following regarding taking any anticoagulation/antiplatelet therapy:   Plavix, Coumadin, Eliquis, Xarelto, Lovenox, Pradaxa, Brilinta, or Effient? no Aspirin? no  Patient confirms/reports the following medications:  Current Outpatient Medications  Medication Sig Dispense Refill  . atorvastatin (LIPITOR) 20 MG tablet TAKE 1 TABLET BY MOUTH EVERY DAY 90 tablet 0  . glucose blood (ONETOUCH VERIO) test strip Use to test blood sugar once daily 100 each 5  . losartan (COZAAR) 25 MG tablet TAKE 1 TABLET BY MOUTH EVERY DAY 90 tablet 0  . mometasone (NASONEX) 50 MCG/ACT nasal spray Place 2 sprays into the nose daily as needed (congestion). 17 g 2  . montelukast (SINGULAIR) 10 MG tablet TAKE 1 TABLET BY MOUTH EVERYDAY AT BEDTIME 90 tablet 3  . Multiple Vitamin (MULTIVITAMIN WITH MINERALS) TABS tablet Take 1 tablet by mouth daily.    . ONE TOUCH LANCETS MISC Use to test blood sugar once daily 200 each 2  . RABEprazole (ACIPHEX) 20 MG tablet TAKE 1 TABLET (20 MG TOTAL) BY MOUTH DAILY. FAILURE OF PANTOPRAZOLE, PREVACID 90 tablet 3  . SitaGLIPtin-MetFORMIN HCl 50-1000 MG TB24 1 tab po daily 30 tablet 11  . polyethylene glycol-electrolytes (NULYTELY) 420 g solution Take 4,000 mLs by mouth once for 1 dose. 4000 mL 0   No current facility-administered medications for this  visit.    Patient confirms/reports the following allergies:  No Known Allergies  Orders Placed This Encounter  Procedures  . Procedural/ Surgical Case Request: COLONOSCOPY WITH PROPOFOL    Standing Status:   Standing    Number of Occurrences:   1    Order Specific Question:   Pre-op diagnosis    Answer:   screening colonoscopy    Order Specific Question:   CPT Code    Answer:   62703    AUTHORIZATION INFORMATION Primary Insurance: 1D#: Group #:  Secondary Insurance: 1D#: Group #:  SCHEDULE INFORMATION: Date: Monday 02/25/20 Time: Location:[facility]

## 2020-01-26 ENCOUNTER — Other Ambulatory Visit: Payer: Self-pay | Admitting: Family Medicine

## 2020-01-29 ENCOUNTER — Other Ambulatory Visit (INDEPENDENT_AMBULATORY_CARE_PROVIDER_SITE_OTHER): Payer: Federal, State, Local not specified - PPO

## 2020-01-29 ENCOUNTER — Other Ambulatory Visit: Payer: Self-pay

## 2020-01-29 DIAGNOSIS — E119 Type 2 diabetes mellitus without complications: Secondary | ICD-10-CM | POA: Diagnosis not present

## 2020-01-29 DIAGNOSIS — E782 Mixed hyperlipidemia: Secondary | ICD-10-CM

## 2020-01-29 LAB — COMPREHENSIVE METABOLIC PANEL
ALT: 19 U/L (ref 0–53)
AST: 17 U/L (ref 0–37)
Albumin: 4.2 g/dL (ref 3.5–5.2)
Alkaline Phosphatase: 64 U/L (ref 39–117)
BUN: 8 mg/dL (ref 6–23)
CO2: 27 mEq/L (ref 19–32)
Calcium: 9.5 mg/dL (ref 8.4–10.5)
Chloride: 103 mEq/L (ref 96–112)
Creatinine, Ser: 0.86 mg/dL (ref 0.40–1.50)
GFR: 113.46 mL/min (ref >60.00)
Glucose, Bld: 89 mg/dL (ref 70–99)
Potassium: 4 mEq/L (ref 3.5–5.1)
Sodium: 138 mEq/L (ref 135–145)
Total Bilirubin: 1.1 mg/dL (ref 0.2–1.2)
Total Protein: 7.3 g/dL (ref 6.0–8.3)

## 2020-01-29 LAB — LIPID PANEL
Cholesterol: 136 mg/dL (ref 0–200)
HDL: 59.7 mg/dL (ref >39.00)
LDL Cholesterol: 65 mg/dL (ref 0–99)
NonHDL: 76.22
Total CHOL/HDL Ratio: 2
Triglycerides: 56 mg/dL (ref 0.0–149.0)
VLDL: 11.2 mg/dL (ref 0.0–40.0)

## 2020-01-29 LAB — MICROALBUMIN / CREATININE URINE RATIO
Creatinine,U: 27.9 mg/dL
Microalb Creat Ratio: 2.5 mg/g (ref 0.0–30.0)
Microalb, Ur: <0.7 mg/dL (ref 0.0–1.9)

## 2020-01-29 LAB — HEMOGLOBIN A1C: Hgb A1c MFr Bld: 6.3 % (ref 4.6–6.5)

## 2020-02-01 ENCOUNTER — Other Ambulatory Visit: Payer: Self-pay

## 2020-02-01 ENCOUNTER — Ambulatory Visit: Payer: Federal, State, Local not specified - PPO | Admitting: Endocrinology

## 2020-02-01 VITALS — BP 120/80 | HR 90 | Ht 69.5 in | Wt 187.6 lb

## 2020-02-01 DIAGNOSIS — E782 Mixed hyperlipidemia: Secondary | ICD-10-CM

## 2020-02-01 DIAGNOSIS — E119 Type 2 diabetes mellitus without complications: Secondary | ICD-10-CM

## 2020-02-01 NOTE — Patient Instructions (Signed)
Check  check blood sugars about 2 hours after meals and do this after different meals by rotation  Recommended blood sugar levels on waking up are 80-130 and about 2 hours after meal is 130-150  Please bring your blood sugar monitor to each visit, thank you

## 2020-02-01 NOTE — Progress Notes (Signed)
Patient ID: Paul Flowers, male   DOB: 1970/08/29, 49 y.o.   MRN: [id number]            Reason for Appointment: Follow-up of diabetes  Referring physician: Diona Browner    History of Present Illness:          Date of diagnosis of type 2 diabetes mellitus: 12/17        Background history:   He had been told to have prediabetes about 2 years ago with highest A1c only 6.5 but no significant hyperglycemia He had not been following any particular diet or exercising and his A1c initially came down to 6.1 However in 12/17 his A1c was up to 6.8 In the third week of January 2018 he started having fatigue, severely increased thirst, frequent urination and was found to have marked hyperglycemia with blood sugar of 620 without Acidosis , had small ketones in the urine  He was switched from metformin to Janumet XR on his initial consultation in 08/2016 because of diarrhea with metformin  Recent history:   Non-insulin hypoglycemic drugs the patient is taking are: Janumet XR 100/1000 daily   His A1c is 6.2 compared to 6.3 last year    Current management, blood sugar patterns and problems identified:  He has not been seen in follow-up since 1/21   He did bring his monitor for download but he is using the insurance provided meter and has only three readings in the last 90 days  Mostly checks blood sugars before dinnertime and recent range 72-139  Blood sugars are near normal before dinnertime at home although he thinks he may have had a reading in the sixties once when he was when he was feeling a little anxious  Lab fasting glucose 89  He has started doing a lot of biking on the weekend for exercise  Overall he says that he is not as active at work and less walking to be done  Please try to cut down on portions for any sweets or other carbohydrates that he may have  No side effects with Janumet which he has taken since 2018  Recent weight is the same        Side effects from  medications have been: diarrhea from metformin  Glucose monitoring:  done 0.5 times a day         Glucometer:  One Touch Verio  Blood Glucose readings as above   Self-care: The diet that the patient has been following is: tries to limit  Carbohydrates, sweets and drinks with sugar.     Meal times are:  Breakfast is at  7 AM Lunch: at work during [employer]: 8 pm                Dietician visit, most recent: 10/2016               Exercise:  doing walking at work, no formal exercise  Weight history:  Wt Readings from Last 3 Encounters:  02/01/20 187 lb 9.6 oz (85.1 kg)  01/22/20 187 lb (84.8 kg)  08/03/19 187 lb (84.8 kg)    Glycemic control:   Lab Results  Component Value Date   HGBA1C 6.3 01/29/2020   HGBA1C 6.3 01/18/2020   HGBA1C 6.2 07/31/2019   Lab Results  Component Value Date   MICROALBUR <0.7 01/29/2020   LDLCALC 65 01/29/2020   CREATININE 0.86 01/29/2020   Lab Results  Component Value Date   MICRALBCREAT 2.5 01/29/2020  Lab Results  Component Value Date   FRUCTOSAMINE 277 09/15/2016     Allergies as of 02/01/2020   No Known Allergies     Medication List       Accurate as of February 01, 2020 10:43 AM. If you have any questions, ask your nurse or doctor.        atorvastatin 20 MG tablet Commonly known as: LIPITOR TAKE 1 TABLET BY MOUTH EVERY DAY   glucose blood test strip Commonly known as: OneTouch Verio Use to test blood sugar once daily   losartan 25 MG tablet Commonly known as: COZAAR TAKE 1 TABLET BY MOUTH EVERY DAY   mometasone 50 MCG/ACT nasal spray Commonly known as: NASONEX Place 2 sprays into the nose daily as needed (congestion).   montelukast 10 MG tablet Commonly known as: SINGULAIR TAKE 1 TABLET BY MOUTH EVERYDAY AT BEDTIME   multivitamin with minerals Tabs tablet Take 1 tablet by mouth daily.   ONE TOUCH LANCETS Misc Use to test blood sugar once daily   RABEprazole 20 MG tablet Commonly known as: ACIPHEX TAKE  1 TABLET (20 MG TOTAL) BY MOUTH DAILY. FAILURE OF PANTOPRAZOLE, PREVACID   SitaGLIPtin-MetFORMIN HCl 50-1000 MG Tb24 1 tab po daily       Allergies: No Known Allergies  Past Medical History:  Diagnosis Date  . Allergic rhinitis   . ALLERGIC RHINITIS 01/19/2007  . Diabetes ([place])    diet controlled  . GERD 01/19/2007  . GERD (gastroesophageal reflux disease)   . Hypertension    Slight  . Sleep apnea 01/29/2011    Past Surgical History:  Procedure Laterality Date  . CYSTOSCOPY/URETEROSCOPY/HOLMIUM LASER/STENT PLACEMENT Right 02/02/2018   Procedure: CYSTOSCOPY/URETEROSCOPY//STENT PLACEMENT;  Surgeon: Cleon Gustin, MD;  Location: [facility];  Service: Urology;  Laterality: Right;  . LEFT HEART CATHETERIZATION WITH CORONARY ANGIOGRAM N/A 12/11/2013   Procedure: LEFT HEART CATHETERIZATION WITH CORONARY ANGIOGRAM;  Surgeon: Josue Hector, MD;  [facility] LAB;  Service: Cardiovascular;  Laterality: N/A;    Family History  Problem Relation Age of Onset  . Hypertension Mother   . Heart disease Father 40  . Hyperlipidemia Father   . Colonic polyp Father   . Diabetes Father   . Diabetes Maternal Grandmother   . Heart disease Maternal Grandmother   . Stroke Maternal Grandfather   . Colon cancer Neg Hx     Social History:  reports that he has never smoked. He has never used smokeless tobacco. He reports current alcohol use. He reports that he does not use drugs.   Review of Systems   Lipid history: He has been continuing Lipitor 20 mg daily for baseline LDL of 141  Last results are as follows:   Lab Results  Component Value Date   CHOL 136 01/29/2020   HDL 59.70 01/29/2020   LDLCALC 65 01/29/2020   LDLDIRECT 149.3 02/12/2013   TRIG 56.0 01/29/2020   CHOLHDL 2 01/29/2020           Hypertension:  treated with  25 mg losartan, prescribed by PCP   BP Readings from Last 3 Encounters:  02/01/20 120/80  01/22/20 112/70  08/03/19 116/78     Most recent eye exam  was In 08/2018, negative  Recently had physical with PCP including foot exam  Physical Examination:  BP 120/80 (BP Location: Left Arm, Patient Position: Sitting, Cuff Size: Normal)   Pulse 90   Ht 5' 9.5" (1.765 m)   Wt 187 lb 9.6 oz (85.1  kg)   SpO2 98%   BMI 27.31 kg/m      ASSESSMENT:  Diabetes type 2, Nonobese  See history of present illness for detailed discussion of current diabetes management, blood sugar patterns and problems identified   His A1c is excellent at 6.3 again  He is showing good control on Janumet XR 100/1000 1 tablet daily This is without any significant home glucose monitoring He has done fairly well on his diet he thinks Not doing a lot of formal exercise and not walking as much as work as before Currently not showing any progression of his diabetes which appears to be mild  LIPIDS: LDL below 100 on Lipitor  Mild hypertension: Well-controlled  PLAN:    He will continue Janumet XR 100/1000 1 tablet daily  Check blood sugars regularly after meals at least once a week  Continue watching portions and avoiding high-fat and high glycemic index foods  To call if blood sugars are usually high  Follow-up in 6 months   There are no Patient Instructions on file for this visit.    Elayne Snare 02/01/2020, 10:43 AM   Note: This office note was prepared with Dragon voice recognition system technology. Any transcriptional errors that result from this process are unintentional.

## 2020-02-19 ENCOUNTER — Telehealth: Payer: Self-pay | Admitting: *Deleted

## 2020-02-19 ENCOUNTER — Ambulatory Visit: Payer: Federal, State, Local not specified - PPO | Attending: Otolaryngology

## 2020-02-19 DIAGNOSIS — G4733 Obstructive sleep apnea (adult) (pediatric): Secondary | ICD-10-CM | POA: Insufficient documentation

## 2020-02-19 NOTE — Telephone Encounter (Signed)
Unfortunately mask does not really protect well from [place].Marland Kitchen more to protect others. I am glad he is vaccinated.  I would wait 5 days from last exposure and have at least rapid COVID test done. Remain isolated until then.

## 2020-02-19 NOTE — Telephone Encounter (Signed)
Patient left a voicemail stating that he was in [place] last week for a convention. Patient stated that he just got word that someone at the convention tested positive for covid.. Patient stated that he was wearing a mask at the convention but a lot of the attendees were not.  Patient stated that he came home Thursday from the convention. Patient stated that he is not having any symptoms and has been vaccinated. Patient wants to know if he should be tested or what Dr. Diona Browner recommends.

## 2020-02-20 ENCOUNTER — Other Ambulatory Visit: Payer: Self-pay

## 2020-02-20 NOTE — Telephone Encounter (Signed)
Patient notified as instructed by telephone. Patient was given information on several locations that do covid testing. Patient stated that he has not been in isolation. Patient denies any symptoms at this time and will go to be tested hopefully today. Patient was given ER precautions should he develop any symptoms.

## 2020-02-20 NOTE — Telephone Encounter (Signed)
Left message on voicemail for patient to call back. 

## 2020-02-21 ENCOUNTER — Other Ambulatory Visit: Payer: Federal, State, Local not specified - PPO | Attending: Gastroenterology

## 2020-02-22 ENCOUNTER — Other Ambulatory Visit
Admission: RE | Admit: 2020-02-22 | Discharge: 2020-02-22 | Disposition: A | Discharge status: Home / Self Care | Payer: Federal, State, Local not specified - PPO | Source: Ambulatory Visit | Attending: Gastroenterology | Admitting: Gastroenterology

## 2020-02-22 ENCOUNTER — Other Ambulatory Visit: Payer: Self-pay

## 2020-02-22 DIAGNOSIS — Z01812 Encounter for preprocedural laboratory examination: Secondary | ICD-10-CM | POA: Insufficient documentation

## 2020-02-22 DIAGNOSIS — Z20822 Contact with and (suspected) exposure to covid-19: Secondary | ICD-10-CM | POA: Insufficient documentation

## 2020-02-22 LAB — SARS CORONAVIRUS 2 (TAT 6-24 HRS): SARS Coronavirus 2: NEGATIVE

## 2020-02-25 ENCOUNTER — Encounter: Payer: Self-pay | Admitting: Gastroenterology

## 2020-02-25 ENCOUNTER — Ambulatory Visit
Admission: RE | Admit: 2020-02-25 | Discharge: 2020-02-25 | Disposition: A | Discharge status: Home / Self Care | Payer: Federal, State, Local not specified - PPO | Attending: Gastroenterology | Admitting: Gastroenterology

## 2020-02-25 ENCOUNTER — Other Ambulatory Visit: Payer: Self-pay

## 2020-02-25 ENCOUNTER — Ambulatory Visit: Payer: Federal, State, Local not specified - PPO | Admitting: Anesthesiology

## 2020-02-25 ENCOUNTER — Encounter
Admission: RE | Disposition: A | Discharge status: Home / Self Care | Payer: Self-pay | Source: Home / Self Care | Attending: Gastroenterology

## 2020-02-25 DIAGNOSIS — G4730 Sleep apnea, unspecified: Secondary | ICD-10-CM | POA: Diagnosis not present

## 2020-02-25 DIAGNOSIS — D120 Benign neoplasm of cecum: Secondary | ICD-10-CM | POA: Diagnosis not present

## 2020-02-25 DIAGNOSIS — K219 Gastro-esophageal reflux disease without esophagitis: Secondary | ICD-10-CM | POA: Insufficient documentation

## 2020-02-25 DIAGNOSIS — Z8371 Family history of colonic polyps: Secondary | ICD-10-CM | POA: Diagnosis not present

## 2020-02-25 DIAGNOSIS — Z1211 Encounter for screening for malignant neoplasm of colon: Secondary | ICD-10-CM

## 2020-02-25 DIAGNOSIS — E119 Type 2 diabetes mellitus without complications: Secondary | ICD-10-CM | POA: Diagnosis not present

## 2020-02-25 DIAGNOSIS — I10 Essential (primary) hypertension: Secondary | ICD-10-CM | POA: Insufficient documentation

## 2020-02-25 DIAGNOSIS — K635 Polyp of colon: Secondary | ICD-10-CM | POA: Diagnosis not present

## 2020-02-25 DIAGNOSIS — Z7984 Long term (current) use of oral hypoglycemic drugs: Secondary | ICD-10-CM | POA: Insufficient documentation

## 2020-02-25 DIAGNOSIS — Z79899 Other long term (current) drug therapy: Secondary | ICD-10-CM | POA: Diagnosis not present

## 2020-02-25 HISTORY — PX: COLONOSCOPY WITH PROPOFOL: SHX5780

## 2020-02-25 LAB — GLUCOSE, CAPILLARY: Glucose-Capillary: 102 mg/dL — ABNORMAL HIGH (ref 70–99)

## 2020-02-25 SURGERY — COLONOSCOPY WITH PROPOFOL
Anesthesia: General

## 2020-02-25 MED ORDER — PHENYLEPHRINE HCL (PRESSORS) 10 MG/ML IV SOLN
INTRAVENOUS | Status: DC | PRN
  Administered 2020-02-25: 100 ug via INTRAVENOUS

## 2020-02-25 MED ORDER — PROPOFOL 10 MG/ML IV BOLUS
INTRAVENOUS | Status: DC | PRN
  Administered 2020-02-25: 60 mg via INTRAVENOUS
  Administered 2020-02-25: 20 mg via INTRAVENOUS
  Administered 2020-02-25: 10 mg via INTRAVENOUS

## 2020-02-25 MED ORDER — LIDOCAINE HCL (CARDIAC) PF 100 MG/5ML IV SOSY: PREFILLED_SYRINGE | INTRAVENOUS | Status: DC | PRN

## 2020-02-25 MED ORDER — LIDOCAINE HCL (CARDIAC) PF 100 MG/5ML IV SOSY
PREFILLED_SYRINGE | INTRAVENOUS | Status: DC | PRN
  Administered 2020-02-25: 100 mg via INTRAVENOUS

## 2020-02-25 MED ORDER — PROPOFOL 500 MG/50ML IV EMUL
INTRAVENOUS | Status: DC | PRN
  Administered 2020-02-25: 165 ug/kg/min via INTRAVENOUS

## 2020-02-25 MED ORDER — MIDAZOLAM HCL 2 MG/2ML IJ SOLN
INTRAMUSCULAR | Status: AC
  Filled 2020-02-25: qty 2

## 2020-02-25 MED ORDER — GLYCOPYRROLATE 0.2 MG/ML IJ SOLN
INTRAMUSCULAR | Status: DC | PRN
  Administered 2020-02-25: .2 mg via INTRAVENOUS

## 2020-02-25 MED ORDER — SODIUM CHLORIDE 0.9 % IV SOLN: INTRAVENOUS | Status: DC

## 2020-02-25 NOTE — Op Note (Signed)
[facility] Gastroenterology Patient Name: Paul Flowers Procedure Date: 02/25/2020 10:10 AM MRN: [id number] Account #: [id number] Date of Birth: 12/31/1970 Admit Type: Outpatient Age: 49 Room: [facility] ENDO ROOM 4 Gender: Male Note Status: Finalized Procedure:             Colonoscopy Indications:           Colon cancer screening in patient at increased risk:                         Family history of 1st-degree relative with colon polyps Providers:             Jonathon Bellows MD, MD Referring MD:          Jinny Sanders MD, MD (Referring MD) Medicines:             Monitored Anesthesia Care Complications:         No immediate complications. Procedure:             Pre-Anesthesia Assessment:                        - Prior to the procedure, a History and Physical was                         performed, and patient medications, allergies and                         sensitivities were reviewed. The patient's tolerance                         of previous anesthesia was reviewed.                        - The risks and benefits of the procedure and the                         sedation options and risks were discussed with the                         patient. All questions were answered and informed                         consent was obtained.                        - ASA Grade Assessment: II - A patient with mild                         systemic disease.                        After obtaining informed consent, the colonoscope was                         passed under direct vision. Throughout the procedure,                         the patient's blood pressure, pulse, and oxygen  saturations were monitored continuously. The                         Colonoscope was introduced through the anus and                         advanced to the the cecum, identified by the                         appendiceal orifice. The colonoscopy was performed                          with ease. The patient tolerated the procedure well.                         The quality of the bowel preparation was excellent. Findings:      The perianal and digital rectal examinations were normal.      Two sessile polyps were found in the cecum. The polyps were 6 to 12 mm       in size. These polyps were removed with a cold snare. Resection and       retrieval were complete.      The exam was otherwise without abnormality on direct and retroflexion       views. Impression:            - Two 6 to 12 mm polyps in the cecum, removed with a                         cold snare. Resected and retrieved.                        - The examination was otherwise normal on direct and                         retroflexion views. Recommendation:        - Discharge patient to home (with escort).                        - Resume previous diet.                        - Continue present medications.                        - Await pathology results.                        - Repeat colonoscopy for surveillance and for                         surveillance based on pathology results. Procedure Code(s):     --- Professional ---                        [phone], Colonoscopy, flexible; with removal of                         tumor(s), polyp(s), or other lesion(s) by snare  technique Diagnosis Code(s):     --- Professional ---                        Z83.71, Family history of colonic polyps                        K63.5, Polyp of colon CPT copyright 2019 American Medical Association. All rights reserved. The codes documented in this report are preliminary and upon coder review may  be revised to meet current compliance requirements. Jonathon Bellows, MD Jonathon Bellows MD, MD 02/25/2020 10:33:41 AM This report has been signed electronically. Number of Addenda: 0 Note Initiated On: 02/25/2020 10:10 AM Scope Withdrawal Time: 0 hours 8 minutes 21 seconds  Total Procedure Duration: 0 hours 9 minutes 52  seconds  Estimated Blood Loss:  Estimated blood loss: none.      [facility]

## 2020-02-25 NOTE — Anesthesia Postprocedure Evaluation (Signed)
Anesthesia Post Note  Patient: Paul Flowers  Procedure(s) Performed: COLONOSCOPY WITH PROPOFOL (N/A )  Patient location during evaluation: PACU Anesthesia Type: General Level of consciousness: awake and alert Pain management: pain level controlled Vital Signs Assessment: post-procedure vital signs reviewed and stable Respiratory status: spontaneous breathing, nonlabored ventilation, respiratory function stable and patient connected to nasal cannula oxygen Cardiovascular status: blood pressure returned to baseline and stable Postop Assessment: no apparent nausea or vomiting Anesthetic complications: no   No complications documented.   Last Vitals:  Vitals:   02/25/20 1034 02/25/20 1044  BP: (!) 94/56 (!) 88/53  Pulse:  91  Resp:  (!) 30  Temp:    SpO2:  100%    Last Pain:  Vitals:   02/25/20 1034  TempSrc:   PainSc: [place] Marqueta Pulley

## 2020-02-25 NOTE — Transfer of Care (Signed)
Immediate Anesthesia Transfer of Care Note  Patient: Paul Flowers  Procedure(s) Performed: COLONOSCOPY WITH PROPOFOL (N/A )  Patient Location: Endoscopy Unit  Anesthesia Type:General  Level of Consciousness: drowsy and patient cooperative  Airway & Oxygen Therapy: Patient Spontanous Breathing and Patient connected to face mask oxygen  Post-op Assessment: Report given to RN and Post -op Vital signs reviewed and stable  Post vital signs: Reviewed and stable  Last Vitals:  Vitals Value Taken Time  BP 94/56 02/25/20 1034  Temp    Pulse 102 02/25/20 1036  Resp 27 02/25/20 1036  SpO2 100 % 02/25/20 1036  Vitals shown include unvalidated device data.  Last Pain:  Vitals:   02/25/20 1034  TempSrc:   PainSc: Asleep         Complications: No complications documented.

## 2020-02-25 NOTE — Anesthesia Procedure Notes (Signed)
Procedure Name: General with mask airway Performed by: Fletcher-Harrison, Kasper Mudrick, CRNA Pre-anesthesia Checklist: Patient identified, Emergency Drugs available, Suction available and Patient being monitored Patient Re-evaluated:Patient Re-evaluated prior to induction Oxygen Delivery Method: Simple face mask Induction Type: IV induction Placement Confirmation: positive ETCO2 and CO2 detector Dental Injury: Teeth and Oropharynx as per pre-operative assessment        

## 2020-02-25 NOTE — H&P (Signed)
Jonathon Bellows, MD [address] [place], [place], [place], [address] [address], [place], [place], [place], [address] Phone: [phone]  Fax: [phone]  Primary Care Physician:  Jinny Sanders, MD   Pre-Procedure History & Physical: HPI:  Paul Flowers is a 49 y.o. male is here for an colonoscopy.   Past Medical History:  Diagnosis Date  . Allergic rhinitis   . ALLERGIC RHINITIS 01/19/2007  . Diabetes ([place])    diet controlled  . GERD 01/19/2007  . GERD (gastroesophageal reflux disease)   . Hypertension    Slight  . Sleep apnea 01/29/2011    Past Surgical History:  Procedure Laterality Date  . CORONARY ANGIOPLASTY     no blockage  . CYSTOSCOPY/URETEROSCOPY/HOLMIUM LASER/STENT PLACEMENT Right 02/02/2018   Procedure: CYSTOSCOPY/URETEROSCOPY//STENT PLACEMENT;  Surgeon: Cleon Gustin, MD;  Location: [facility];  Service: Urology;  Laterality: Right;  . LEFT HEART CATHETERIZATION WITH CORONARY ANGIOGRAM N/A 12/11/2013   Procedure: LEFT HEART CATHETERIZATION WITH CORONARY ANGIOGRAM;  Surgeon: Josue Hector, MD;  Location: [facility] C[facility] LAB;  Service: Cardiovascular;  Laterality: N/A;    Prior to Admission medications   Medication Sig Start Date End Date Taking? Authorizing Provider  atorvastatin (LIPITOR) 20 MG tablet TAKE 1 TABLET BY MOUTH EVERY DAY 12/17/19  Yes Bedsole, Amy E, MD  losartan (COZAAR) 25 MG tablet TAKE 1 TABLET BY MOUTH EVERY DAY 12/17/19  Yes Bedsole, Amy E, MD  mometasone (NASONEX) 50 MCG/ACT nasal spray Place 2 sprays into the nose daily as needed (congestion). 12/11/18  Yes Bedsole, Amy E, MD  montelukast (SINGULAIR) 10 MG tablet TAKE 1 TABLET BY MOUTH EVERYDAY AT BEDTIME 01/28/20  Yes Bedsole, Amy E, MD  Multiple Vitamin (MULTIVITAMIN WITH MINERALS) TABS tablet Take 1 tablet by mouth daily.   Yes [provider]  SitaGLIPtin-MetFORMIN HCl 50-1000 MG TB24 1 tab po daily 01/22/20  Yes Bedsole, Amy E, MD  glucose blood (ONETOUCH VERIO) test strip Use to  test blood sugar once daily 08/23/16   Elayne Snare, MD  ONE [facility] LANCETS MISC Use to test blood sugar once daily 08/23/16   Elayne Snare, MD  RABEprazole (ACIPHEX) 20 MG tablet TAKE 1 TABLET (20 MG TOTAL) BY MOUTH DAILY. FAILURE OF PANTOPRAZOLE, PREVACID 02/09/19   Jinny Sanders, MD    Allergies as of 01/24/2020  . (No Known Allergies)    Family History  Problem Relation Age of Onset  . Hypertension Mother   . Heart disease Father 44  . Hyperlipidemia Father   . Colonic polyp Father   . Diabetes Father   . Diabetes Maternal Grandmother   . Heart disease Maternal Grandmother   . Stroke Maternal Grandfather   . Colon cancer Neg Hx     Social History   Socioeconomic History  . Marital status: Divorced    Spouse name: Not on file  . Number of children: 2  . Years of education: Not on file  . Highest education level: Not on file  Occupational History  . Occupation: Buyer, retail: [employer]  Tobacco Use  . Smoking status: Never Smoker  . Smokeless tobacco: Never Used  Vaping Use  . Vaping Use: Never used  Substance and Sexual Activity  . Alcohol use: Yes    Alcohol/week: 0.0 standard drinks    Comment: occ  . Drug use: No  . Sexual activity: Not on file  Other Topics Concern  . Not on file  Social  History Narrative   Marital Status: Divorced '96, remarried '98   Children: son , dtr    Occupation: superviser   Hobbies: bowls 2 x a week/ floor exercise   Never smoked    Alcohol- 2 drinks per day      Social Determinants of Radio broadcast assistant Strain:   . Difficulty of Paying Living Expenses: Not on file  Food Insecurity:   . Worried About Charity fundraiser in the Last Year: Not on file  . Ran Out of Food in the Last Year: Not on file  Transportation Needs:   . Lack of Transportation (Medical): Not on file  . Lack of Transportation (Non-Medical): Not on file  Physical Activity:   . Days of Exercise per Week: Not on file  . Minutes of  Exercise per Session: Not on file  Stress:   . Feeling of Stress : Not on file  Social Connections:   . Frequency of Communication with Friends and Family: Not on file  . Frequency of Social Gatherings with Friends and Family: Not on file  . Attends Religious Services: Not on file  . Active Member of Clubs or Organizations: Not on file  . Attends Archivist Meetings: Not on file  . Marital Status: Not on file  Intimate Partner Violence:   . Fear of Current or Ex-Partner: Not on file  . Emotionally Abused: Not on file  . Physically Abused: Not on file  . Sexually Abused: Not on file    Review of Systems: See HPI, otherwise negative ROS  Physical Exam: BP (!) 127/100   Pulse 93   Temp (!) 96.9 F (36.1 C) (Temporal)   Resp 20   Ht 5\' 10"  (1.778 m)   Wt 79.4 kg   SpO2 98%   BMI 25.11 kg/m  General:   Alert,  pleasant and cooperative in NAD Head:  Normocephalic and atraumatic. Neck:  Supple; no masses or thyromegaly. Lungs:  Clear throughout to auscultation, normal respiratory effort.    Heart:  +S1, +S2, Regular rate and rhythm, No edema. Abdomen:  Soft, nontender and nondistended. Normal bowel sounds, without guarding, and without rebound.   Neurologic:  Alert and  oriented x4;  grossly normal neurologically.  Impression/Plan: Paul Flowers is here for an colonoscopy to be performed for Screening colonoscopy , father had colon polyps Risks, benefits, limitations, and alternatives regarding  colonoscopy have been reviewed with the patient.  Questions have been answered.  All parties agreeable.   Jonathon Bellows, MD  02/25/2020, 10:17 AM

## 2020-02-25 NOTE — Anesthesia Preprocedure Evaluation (Signed)
Anesthesia Evaluation  Patient identified by MRN, date of birth, ID band Patient awake    Reviewed: Allergy & Precautions, H&P , NPO status , Patient's Chart, lab work & pertinent test results, reviewed documented beta blocker date and time   Airway Mallampati: II   Neck ROM: full    Dental  (+) Poor Dentition   Pulmonary sleep apnea ,    Pulmonary exam normal        Cardiovascular Exercise Tolerance: Good hypertension, On Medications negative cardio ROS Normal cardiovascular exam Rhythm:regular Rate:Normal     Neuro/Psych PSYCHIATRIC DISORDERS Depression negative neurological ROS     GI/Hepatic Neg liver ROS, GERD  Medicated,  Endo/Other  negative endocrine ROSdiabetes  Renal/GU Renal disease  negative genitourinary   Musculoskeletal   Abdominal   Peds  Hematology negative hematology ROS (+)   Anesthesia Other Findings Past Medical History: No date: Allergic rhinitis 01/19/2007: ALLERGIC RHINITIS No date: Diabetes ([place])     Comment:  diet controlled 01/19/2007: GERD No date: GERD (gastroesophageal reflux disease) No date: Hypertension     Comment:  Slight 01/29/2011: Sleep apnea Past Surgical History: No date: CORONARY ANGIOPLASTY     Comment:  no blockage 02/02/2018: CYSTOSCOPY/URETEROSCOPY/HOLMIUM LASER/STENT PLACEMENT; Right     Comment:  Procedure: CYSTOSCOPY/URETEROSCOPY//STENT PLACEMENT;                Surgeon: Cleon Gustin, MD;  Location: [facility];                Service: Urology;  Laterality: Right; 12/11/2013: LEFT HEART CATHETERIZATION WITH CORONARY ANGIOGRAM; N/A     Comment:  Procedure: LEFT HEART CATHETERIZATION WITH CORONARY               ANGIOGRAM;  Surgeon: Josue Hector, MD;  Location: [facility]               CATH LAB;  Service: Cardiovascular;  Laterality: N/A;   Reproductive/Obstetrics negative OB ROS                             Anesthesia Physical Anesthesia  Plan  ASA: III  Anesthesia Plan: General   Post-op Pain Management:    Induction:   PONV Risk Score and Plan:   Airway Management Planned:   Additional Equipment:   Intra-op Plan:   Post-operative Plan:   Informed Consent: I have reviewed the patients History and Physical, chart, labs and discussed the procedure including the risks, benefits and alternatives for the proposed anesthesia with the patient or authorized representative who has indicated his/her understanding and acceptance.     Dental Advisory Given  Plan Discussed with: CRNA  Anesthesia Plan Comments:         Anesthesia Quick Evaluation

## 2020-02-26 ENCOUNTER — Encounter: Payer: Self-pay | Admitting: Gastroenterology

## 2020-02-26 LAB — SURGICAL PATHOLOGY

## 2020-02-27 ENCOUNTER — Encounter: Payer: Self-pay | Admitting: Gastroenterology

## 2020-03-01 ENCOUNTER — Other Ambulatory Visit: Payer: Self-pay | Admitting: Family Medicine

## 2020-03-08 ENCOUNTER — Other Ambulatory Visit: Payer: Self-pay | Admitting: Family Medicine

## 2020-03-11 ENCOUNTER — Telehealth: Payer: Self-pay

## 2020-03-11 NOTE — Telephone Encounter (Signed)
Patient contacted the office and states that he is needing Dr. Diona Browner or Butch Penny to call [facility] on [address] and give an override on his Rabeprazole rx. He states they will not fill this rx until this is completed.

## 2020-03-11 NOTE — Telephone Encounter (Addendum)
Spoke with Cristie Hem at [facility].  He states patient's insurance will only cover #90 per year so an override will need to be done.   PA completed on CoverMyMeds and sent for review.  Can take up to 1 to 5 business days for a decision. [name] notified of this by telephone.

## 2020-03-14 NOTE — Telephone Encounter (Signed)
PA approved valid from 02/12/2020 through 03/13/2021.  Paul Flowers notified by telephone.

## 2020-03-28 ENCOUNTER — Encounter (HOSPITAL_COMMUNITY): Payer: Self-pay

## 2020-03-28 ENCOUNTER — Emergency Department (HOSPITAL_COMMUNITY): Payer: Federal, State, Local not specified - PPO

## 2020-03-28 ENCOUNTER — Other Ambulatory Visit: Payer: Self-pay

## 2020-03-28 ENCOUNTER — Emergency Department (HOSPITAL_COMMUNITY)
Admission: EM | Admit: 2020-03-28 | Discharge: 2020-03-28 | Disposition: A | Discharge status: Home / Self Care | Payer: Federal, State, Local not specified - PPO | Attending: Emergency Medicine | Admitting: Emergency Medicine

## 2020-03-28 DIAGNOSIS — E119 Type 2 diabetes mellitus without complications: Secondary | ICD-10-CM | POA: Insufficient documentation

## 2020-03-28 DIAGNOSIS — R109 Unspecified abdominal pain: Secondary | ICD-10-CM | POA: Diagnosis not present

## 2020-03-28 DIAGNOSIS — R609 Edema, unspecified: Secondary | ICD-10-CM | POA: Diagnosis not present

## 2020-03-28 DIAGNOSIS — I10 Essential (primary) hypertension: Secondary | ICD-10-CM | POA: Insufficient documentation

## 2020-03-28 DIAGNOSIS — R1032 Left lower quadrant pain: Secondary | ICD-10-CM | POA: Diagnosis not present

## 2020-03-28 DIAGNOSIS — K219 Gastro-esophageal reflux disease without esophagitis: Secondary | ICD-10-CM | POA: Diagnosis not present

## 2020-03-28 DIAGNOSIS — N200 Calculus of kidney: Secondary | ICD-10-CM | POA: Diagnosis not present

## 2020-03-28 DIAGNOSIS — R319 Hematuria, unspecified: Secondary | ICD-10-CM | POA: Diagnosis not present

## 2020-03-28 DIAGNOSIS — N281 Cyst of kidney, acquired: Secondary | ICD-10-CM | POA: Diagnosis not present

## 2020-03-28 DIAGNOSIS — Z79899 Other long term (current) drug therapy: Secondary | ICD-10-CM | POA: Diagnosis not present

## 2020-03-28 DIAGNOSIS — K8020 Calculus of gallbladder without cholecystitis without obstruction: Secondary | ICD-10-CM | POA: Diagnosis not present

## 2020-03-28 HISTORY — DX: Disorder of kidney and ureter, unspecified: N28.9

## 2020-03-28 LAB — CBC WITH DIFFERENTIAL/PLATELET
Abs Immature Granulocytes: 0.03 10*3/uL (ref 0.00–0.07)
Basophils Absolute: 0 10*3/uL (ref 0.0–0.1)
Basophils Relative: 0 %
Eosinophils Absolute: 0.1 10*3/uL (ref 0.0–0.5)
Eosinophils Relative: 2 %
HCT: 44.9 % (ref 39.0–52.0)
Hemoglobin: 15.2 g/dL (ref 13.0–17.0)
Immature Granulocytes: 0 %
Lymphocytes Relative: 17 %
Lymphs Abs: 1.5 10*3/uL (ref 0.7–4.0)
MCH: 29.2 pg (ref 26.0–34.0)
MCHC: 33.9 g/dL (ref 30.0–36.0)
MCV: 86.3 fL (ref 80.0–100.0)
Monocytes Absolute: 0.8 10*3/uL (ref 0.1–1.0)
Monocytes Relative: 9 %
Neutro Abs: 6.5 10*3/uL (ref 1.7–7.7)
Neutrophils Relative %: 72 %
Platelets: 308 10*3/uL (ref 150–400)
RBC: 5.2 MIL/uL (ref 4.22–5.81)
RDW: 12.3 % (ref 11.5–15.5)
WBC: 9.1 10*3/uL (ref 4.0–10.5)
nRBC: 0 % (ref 0.0–0.2)

## 2020-03-28 LAB — URINALYSIS, ROUTINE W REFLEX MICROSCOPIC
Bacteria, UA: NONE SEEN
Bilirubin Urine: NEGATIVE
Glucose, UA: NEGATIVE mg/dL
Ketones, ur: NEGATIVE mg/dL
Leukocytes,Ua: NEGATIVE
Nitrite: NEGATIVE
Protein, ur: NEGATIVE mg/dL
RBC / HPF: >50 RBC/hpf — ABNORMAL HIGH (ref 0–5)
Specific Gravity, Urine: 1.017 (ref 1.005–1.030)
pH: 6 (ref 5.0–8.0)

## 2020-03-28 LAB — BASIC METABOLIC PANEL
Anion gap: 9 (ref 5–15)
BUN: 9 mg/dL (ref 6–20)
CO2: 25 mmol/L (ref 22–32)
Calcium: 9.2 mg/dL (ref 8.9–10.3)
Chloride: 105 mmol/L (ref 98–111)
Creatinine, Ser: 0.77 mg/dL (ref 0.61–1.24)
GFR calc Af Amer: >60 mL/min (ref >60)
GFR calc non Af Amer: >60 mL/min (ref >60)
Glucose, Bld: 127 mg/dL — ABNORMAL HIGH (ref 70–99)
Potassium: 3.9 mmol/L (ref 3.5–5.1)
Sodium: 139 mmol/L (ref 135–145)

## 2020-03-28 MED ORDER — MORPHINE SULFATE (PF) 4 MG/ML IV SOLN
4.0000 mg | Freq: Once | INTRAVENOUS | Status: AC
  Administered 2020-03-28: 4 mg via INTRAVENOUS
  Filled 2020-03-28: qty 1

## 2020-03-28 MED ORDER — OXYCODONE-ACETAMINOPHEN 5-325 MG PO TABS: 1.0000 | ORAL_TABLET | Freq: Four times a day (QID) | ORAL | 0 refills | Status: DC | PRN

## 2020-03-28 MED ORDER — ONDANSETRON HCL 4 MG/2ML IJ SOLN
4.0000 mg | Freq: Once | INTRAMUSCULAR | Status: AC
  Administered 2020-03-28: 4 mg via INTRAVENOUS
  Filled 2020-03-28: qty 2

## 2020-03-28 MED ORDER — IBUPROFEN 800 MG PO TABS: 800.0000 mg | ORAL_TABLET | Freq: Three times a day (TID) | ORAL | 0 refills | Status: DC

## 2020-03-28 MED ORDER — KETOROLAC TROMETHAMINE 30 MG/ML IJ SOLN
30.0000 mg | Freq: Once | INTRAMUSCULAR | Status: AC
  Administered 2020-03-28: 30 mg via INTRAVENOUS
  Filled 2020-03-28: qty 1

## 2020-03-28 NOTE — Discharge Instructions (Addendum)
I suspect you passed a kidney stone this morning, you can continue to have pain after passing a stone for 24-48 hours and then pain should improve.  We are sending you home with medications to help with pain:   -Ibuprofen 800 mg-this is a medication that will help with pain as well as passing the stone.  Please take this every 8 hours.  Take this with food as it can cause stomach upset and at worst stomach bleeding.  Do not take other NSAIDs such as Motrin, Aleve, Advil, Mobic, or Naproxen with this medicine as they are similar and would propagate any potential side effects.   -Percocet- this is a narcotic/controlled substance medication that has potential addicting qualities.  We recommend that you take 1 tablet every 6 hours as needed for severe pain.  Do not drive or operate heavy machinery when taking this medicine as it can be sedating. Do not drink alcohol or take other sedating medications when taking this medicine for safety reasons.  Keep this out of reach of small children.  Please be aware this medicine has Tylenol in it (325 mg/tab) do not exceed the maximum dose of Tylenol in a day per over the counter recommendations should you decide to supplement with Tylenol over the counter.    We have prescribed you new medication(s) today. Discuss the medications prescribed today with your pharmacist as they can have adverse effects and interactions with your other medicines including over the counter and prescribed medications. Seek medical evaluation if you start to experience new or abnormal symptoms after taking one of these medicines, seek care immediately if you start to experience difficulty breathing, feeling of your throat closing, facial swelling, or rash as these could be indications of a more serious allergic reaction  Please follow-up with your PCP or the urology group provided in your discharge instructions within 3 to 5 days.  Return to the ER for new or worsening symptoms including but  not limited to worsening pain not controlled by these medicines, inability to keep fluids down, fever, or any other concerns that you may have.

## 2020-03-28 NOTE — ED Triage Notes (Signed)
Patient c/o left flank pain x 2 hours ago. Patient has a history of kidney stones. Patient states when the pain started he was unable to urinate at that time. Patient denies pain at this time.

## 2020-03-28 NOTE — ED Provider Notes (Signed)
[facility] DEPT Provider Note   CSN: [id number] Arrival date & time: 03/28/20  1349     History Chief Complaint  Patient presents with  . Flank Pain    Paul Flowers is a 49 y.o. male.  Paul Flowers is a 49 y.o. male with a history of hypertension, GERD, kidney stones, who presents to the emergency department for evaluation of left flank pain that started around 10 or 11 this morning.  He describes it as a sharp stabbing pain that comes intermittently and radiates towards his groin.  He states this morning when the pain first started he felt like he was having difficulty urinating but then was able to pass urine.  He did not note any blood in his urine.  Since then he has had periods where the pain eases off and then it comes back and continues to be a sharp stabbing pain.  He denies any focal anterior abdominal pain.  No nausea or vomiting.  He does not have any pain with urination, pain or swelling of the testicles.  He reports that this does feel similar to previous kidney stones, the last one he had was 2 years ago.  He states they did have to do an intervention once for a kidney stone. No fevers, nausea, vomiting, chest pain, shortness of breath.        Past Medical History:  Diagnosis Date  . Allergic rhinitis   . ALLERGIC RHINITIS 01/19/2007  . Diabetes ([place])    diet controlled  . GERD 01/19/2007  . GERD (gastroesophageal reflux disease)   . Hypertension    Slight  . Renal disorder   . Sleep apnea 01/29/2011    Patient Active Problem List   Diagnosis Date Noted  . Current moderate episode of major depressive disorder without prior episode ([place]) 03/28/2019  . Bilateral foot pain 01/13/2018  . Hyperlipidemia associated with type 2 diabetes mellitus ([place]) 06/11/2015  . 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 Controlled type 2 diabetes mellitus without complication, without long-term current use of insulin ([place]) 09/24/2014  .  Hypertension associated with diabetes ([place]) 11/09/2013  . Routine health maintenance 02/12/2013  . OSA (obstructive sleep apnea) 03/19/2011  . Allergic rhinitis 01/19/2007  . GERD 01/19/2007    Past Surgical History:  Procedure Laterality Date  . COLONOSCOPY WITH PROPOFOL N/A 02/25/2020   Procedure: COLONOSCOPY WITH PROPOFOL;  Surgeon: Jonathon Bellows, MD;  Location: [facility] ENDOSCOPY;  Service: Gastroenterology;  Laterality: N/A;  . CORONARY ANGIOPLASTY     no blockage  . CYSTOSCOPY/URETEROSCOPY/HOLMIUM LASER/STENT PLACEMENT Right 02/02/2018   Procedure: CYSTOSCOPY/URETEROSCOPY//STENT PLACEMENT;  Surgeon: Cleon Gustin, MD;  Location: [facility] ORS;  Service: Urology;  Laterality: Right;  . LEFT HEART CATHETERIZATION WITH CORONARY ANGIOGRAM N/A 12/11/2013   Procedure: LEFT HEART CATHETERIZATION WITH CORONARY ANGIOGRAM;  Surgeon: Josue Hector, MD;  Location: [facility] CATH LAB;  Service: Cardiovascular;  Laterality: N/A;       Family History  Problem Relation Age of Onset  . Hypertension Mother   . Heart disease Father 56  . Hyperlipidemia Father   . Colonic polyp Father   . Diabetes Father   . Diabetes Maternal Grandmother   . Heart disease Maternal Grandmother   . Stroke Maternal Grandfather   . Colon cancer Neg Hx     Social History   Tobacco Use  . Smoking status: Never Smoker  . Smokeless tobacco: Never Used  Vaping Use  . Vaping Use: Never used  Substance Use Topics  . Alcohol use: Yes    Alcohol/week: 0.0 standard drinks    Comment: occ  . Drug use: No    Home Medications Prior to Admission medications   Medication Sig Start Date End Date Taking? Authorizing Provider  atorvastatin (LIPITOR) 20 MG tablet TAKE 1 TABLET BY MOUTH EVERY DAY Patient taking differently: Take 20 mg by mouth daily.  03/11/20  Yes Bedsole, Amy E, MD  losartan (COZAAR) 25 MG tablet TAKE 1 TABLET BY MOUTH EVERY DAY Patient taking differently: Take 25 mg by mouth daily.  03/11/20  Yes Bedsole, Amy E, MD    mometasone (NASONEX) 50 MCG/ACT nasal spray Place 2 sprays into the nose daily as needed (congestion). 12/11/18  Yes Bedsole, Amy E, MD  montelukast (SINGULAIR) 10 MG tablet TAKE 1 TABLET BY MOUTH EVERYDAY AT BEDTIME Patient taking differently: Take 10 mg by mouth at bedtime.  01/28/20  Yes Bedsole, Amy E, MD  Multiple Vitamin (MULTIVITAMIN WITH MINERALS) TABS tablet Take 1 tablet by mouth daily.   Yes [provider]  RABEprazole (ACIPHEX) 20 MG tablet TAKE 1 TABLET (20 MG TOTAL) BY MOUTH DAILY. FAILURE OF PANTOPRAZOLE, PREVACID Patient taking differently: Take 20 mg by mouth daily.  03/03/20  Yes Bedsole, Amy E, MD  glucose blood (ONETOUCH VERIO) test strip Use to test blood sugar once daily 08/23/16   Elayne Snare, MD  ibuprofen (ADVIL) 800 MG tablet Take 1 tablet (800 mg total) by mouth 3 (three) times daily. 03/28/20   Jacqlyn Larsen, PA-C  ONE TOUCH LANCETS MISC Use to test blood sugar once daily 08/23/16   Elayne Snare, MD  oxyCODONE-acetaminophen (PERCOCET) 5-325 MG tablet Take 1 tablet by mouth every 6 (six) hours as needed. 03/28/20   Jacqlyn Larsen, PA-C  SitaGLIPtin-MetFORMIN HCl 50-1000 MG TB24 1 tab po daily Patient taking differently: Take 1 tablet by mouth daily.  01/22/20   Jinny Sanders, MD    Allergies    Patient has no known allergies.  Review of Systems   Review of Systems  Constitutional: Negative for chills and fever.  Respiratory: Negative for cough and shortness of breath.   Cardiovascular: Negative for chest pain.  Gastrointestinal: Negative for abdominal pain, diarrhea, nausea and vomiting.  Genitourinary: Positive for flank pain and hematuria. Negative for dysuria, frequency, scrotal swelling and testicular pain.  Musculoskeletal: Negative for arthralgias and myalgias.  Skin: Negative for color change and rash.  All other systems reviewed and are negative.   Physical Exam Updated Vital Signs BP (!) 157/96 (BP Location: Left Arm)   Pulse 79   Temp 98.2 F  (36.8 C) (Oral)   Resp 16   Ht 5\' 10"  (1.778 m)   Wt 84.8 kg   SpO2 98%   BMI 26.83 kg/m   Physical Exam Vitals and nursing note reviewed.  Constitutional:      General: He is not in acute distress.    Appearance: Normal appearance. He is well-developed. He is not ill-appearing or diaphoretic.     Comments: Well-appearing and in no distress  HENT:     Head: Normocephalic and atraumatic.     Mouth/Throat:     Mouth: Mucous membranes are moist.     Pharynx: Oropharynx is clear.  Eyes:     General:        Right eye: No discharge.        Left eye: No discharge.  Cardiovascular:     Rate and Rhythm: Normal rate and  regular rhythm.     Heart sounds: Normal heart sounds. No murmur heard.  No friction rub. No gallop.   Pulmonary:     Effort: Pulmonary effort is normal. No respiratory distress.     Breath sounds: Normal breath sounds. No wheezing or rales.     Comments: Respirations equal and unlabored, patient able to speak in full sentences, lungs clear to auscultation bilaterally Abdominal:     General: Bowel sounds are normal. There is no distension.     Palpations: Abdomen is soft. There is no mass.     Tenderness: There is no abdominal tenderness. There is left CVA tenderness. There is no guarding.  Musculoskeletal:        General: No deformity.     Cervical back: Neck supple.  Skin:    General: Skin is warm and dry.     Capillary Refill: Capillary refill takes less than 2 seconds.  Neurological:     Mental Status: He is alert.     Coordination: Coordination normal.     Comments: Speech is clear, able to follow commands Moves extremities without ataxia, coordination intact  Psychiatric:        Mood and Affect: Mood normal.        Behavior: Behavior normal.     ED Results / Procedures / Treatments   Labs (all labs ordered are listed, but only abnormal results are displayed) Labs Reviewed  URINALYSIS, ROUTINE W REFLEX MICROSCOPIC - Abnormal; Notable for the  following components:      Result Value   Hgb urine dipstick MODERATE (*)    RBC / HPF >50 (*)    All other components within normal limits  BASIC METABOLIC PANEL - Abnormal; Notable for the following components:   Glucose, Bld 127 (*)    All other components within normal limits  CBC WITH DIFFERENTIAL/PLATELET    EKG None  Radiology CT Renal Stone Study  Result Date: 03/28/2020 CLINICAL DATA:  Left flank pain for 2 hours. History of kidney stones. EXAM: CT ABDOMEN AND PELVIS WITHOUT CONTRAST TECHNIQUE: Multidetector CT imaging of the abdomen and pelvis was performed following the standard protocol without IV contrast. COMPARISON:  02/01/2018 FINDINGS: Lower chest: Streaky bibasilar atelectasis and dependent edema. No pleural effusions or worrisome pulmonary lesions. Calcified granuloma noted at the left lung base. The heart is normal in size. No pericardial effusion. Hepatobiliary: No hepatic lesions are identified without contrast. No intrahepatic biliary dilatation. There is a calculus noted at the fundal region of the gallbladder. No findings for acute cholecystitis. No common bile duct dilatation. Pancreas: No mass, inflammation or ductal dilatation. Spleen: Normal size.  No focal lesions. Adrenals/Urinary Tract: The adrenal glands are normal. Stable 4.5 cm cyst associated with the left kidney. Stable small right renal calculi. No obstructing ureteral calculi or bladder calculi. Stomach/Bowel: The stomach, duodenum, small bowel and colon are grossly normal without oral contrast. No acute inflammatory process, mass lesions or obstructive findings. The terminal ileum is normal. The appendix is normal. Vascular/Lymphatic: The aorta is normal in caliber. No atheroscerlotic calcifications. No mesenteric of retroperitoneal mass or adenopathy. Small scattered lymph nodes are noted. Reproductive: The prostate gland and seminal vesicles are unremarkable. Other: No pelvic mass or adenopathy. No free  pelvic fluid collections. No inguinal mass or adenopathy. No abdominal wall hernia or subcutaneous lesions. Musculoskeletal: No significant bony findings. IMPRESSION: 1. Stable small right renal calculi but no obstructing ureteral calculi or bladder calculi. 2. No acute abdominal/pelvic findings, mass lesions or  adenopathy. 3. Cholelithiasis. Electronically Signed   By: Marijo Sanes M.D.   On: 03/28/2020 18:58    Procedures Procedures (including critical care time)  Medications Ordered in ED Medications  morphine 4 MG/ML injection 4 mg (4 mg Intravenous Given 03/28/20 1707)  ondansetron (ZOFRAN) injection 4 mg (4 mg Intravenous Given 03/28/20 1705)  ketorolac (TORADOL) 30 MG/ML injection 30 mg (30 mg Intravenous Given 03/28/20 1925)    ED Course  I have reviewed the triage vital signs and the nursing notes.  Pertinent labs & imaging results that were available during my care of the patient were reviewed by me and considered in my medical decision making (see chart for details).    MDM Rules/Calculators/A&P                          Patient presents to the ED with complaints of flank/abdominal pain that feels similar to previous kidney stones, present on left. Patient nontoxic appearing, vitals without significant abnormalities. On exam patient has mild left CVA tenderness, abdomen otherwise NTTP, no peritoneal signs. DDX: nephrolithiasis, pyelonephritis/UTI, cholecystitis, pancreatitis, bowel obstruction/perforation, appendicitis, dissection, feel nephrolithiasis is most likely at this time will evaluate with labs and CT renal study, analgesics, anti-emetics, and fluids ordered.   No leukocytosis, anemia, or significant electrolyte derangements.  CT renal study with some right intrarenal calculi but no obstructing stones, suspect recently passed left renal stone. No other acute findings. Renal function preserved. Urinalysis without appearance of superimposed infection, hematuria noted. Patient  tolerating PO in the ER with pain well controlled.  Discussed with patient that he can continue to have some ureteral spasm for 1 to 2 days after passing the stone.  Will discharge home with  NSAID, and Percocet with PCP/urology follow up. [place] Controlled Substance reporting System queried. I discussed results, treatment plan, need for urology follow-up, and return precautions with the patient. Provided opportunity for questions, patient confirmed understanding and is in agreement with plan.   Final Clinical Impression(s) / ED Diagnoses Final diagnoses:  Left flank pain  Hematuria, unspecified type    Rx / DC Orders ED Discharge Orders         Ordered    ibuprofen (ADVIL) 800 MG tablet  3 times daily        03/28/20 1926    oxyCODONE-acetaminophen (PERCOCET) 5-325 MG tablet  Every 6 hours PRN        03/28/20 1926           Jacqlyn Larsen, PA-C 03/29/20 1854    Lajean Saver, MD 03/31/20 1452

## 2020-04-19 ENCOUNTER — Other Ambulatory Visit: Payer: Self-pay | Admitting: Family Medicine

## 2020-05-08 ENCOUNTER — Ambulatory Visit: Payer: Federal, State, Local not specified - PPO | Admitting: Family Medicine

## 2020-05-08 ENCOUNTER — Encounter: Payer: Self-pay | Admitting: Family Medicine

## 2020-05-08 ENCOUNTER — Other Ambulatory Visit: Payer: Self-pay

## 2020-05-08 VITALS — BP 120/88 | HR 91 | Temp 97.3°F | Ht 69.5 in | Wt 189.5 lb

## 2020-05-08 DIAGNOSIS — K148 Other diseases of tongue: Secondary | ICD-10-CM

## 2020-05-08 DIAGNOSIS — Z23 Encounter for immunization: Secondary | ICD-10-CM | POA: Diagnosis not present

## 2020-05-08 NOTE — Progress Notes (Signed)
Amberlynn Tempesta T. Sean Macwilliams, MD, [place] at [facility] [place], [address]  Phone: [phone]  FAX: [place] - 49 y.o. male  MRN [id number]  Date of Birth: 1970/07/24  Date: 05/08/2020  PCP: Jinny Sanders, MD  Referral: Jinny Sanders, MD  Chief Complaint  Patient presents with  . Lump on Tongue    This visit occurred during the SARS-CoV-2 public health emergency.  Safety protocols were in place, including screening questions prior to the visit, additional usage of staff PPE, and extensive cleaning of exam room while observing appropriate contact time as indicated for disinfecting solutions.   Subjective:   Paul Flowers is a 49 y.o. very pleasant male patient with Body mass index is 27.58 kg/m. who presents with the following:  For about 2 weeks the patient has had a lesion he can feel on the posterior at this time.  Does not hurt it.  He does not have any known trauma.  He cannot visualize it.  He has never been a smoker, he is not chewed tobacco.  Does drink alcohol every day but this is generally  Alcohol about one daily.   Posterior deep of tongue.    Patient Active Problem List   Diagnosis Date Noted  . Current moderate episode of major depressive disorder without prior episode ([place]) 03/28/2019  . Bilateral foot pain 01/13/2018  . Hyperlipidemia associated with type 2 diabetes mellitus ([place]) 06/11/2015  . 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 Controlled type 2 diabetes mellitus without complication, without long-term current use of insulin ([place]) 09/24/2014  . Hypertension associated with diabetes ([place]) 11/09/2013  . Routine health maintenance 02/12/2013  . OSA (obstructive sleep apnea) 03/19/2011  . Allergic rhinitis 01/19/2007  . GERD 01/19/2007    Past Medical History:  Diagnosis Date  . Allergic rhinitis   . ALLERGIC RHINITIS 01/19/2007  .  Diabetes ([place])    diet controlled  . GERD 01/19/2007  . GERD (gastroesophageal reflux disease)   . Hypertension    Slight  . Renal disorder   . Sleep apnea 01/29/2011    Past Surgical History:  Procedure Laterality Date  . COLONOSCOPY WITH PROPOFOL N/A 02/25/2020   Procedure: COLONOSCOPY WITH PROPOFOL;  Surgeon: Jonathon Bellows, MD;  Location: [facility] ENDOSCOPY;  Service: Gastroenterology;  Laterality: N/A;  . CORONARY ANGIOPLASTY     no blockage  . CYSTOSCOPY/URETEROSCOPY/HOLMIUM LASER/STENT PLACEMENT Right 02/02/2018   Procedure: CYSTOSCOPY/URETEROSCOPY//STENT PLACEMENT;  Surgeon: Cleon Gustin, MD;  Location: [facility] ORS;  Service: Urology;  Laterality: Right;  . LEFT HEART CATHETERIZATION WITH CORONARY ANGIOGRAM N/A 12/11/2013   Procedure: LEFT HEART CATHETERIZATION WITH CORONARY ANGIOGRAM;  Surgeon: Josue Hector, MD;  Location: [facility] CATH LAB;  Service: Cardiovascular;  Laterality: N/A;    Family History  Problem Relation Age of Onset  . Hypertension Mother   . Heart disease Father 58  . Hyperlipidemia Father   . Colonic polyp Father   . Diabetes Father   . Diabetes Maternal Grandmother   . Heart disease Maternal Grandmother   . Stroke Maternal Grandfather   . Colon cancer Neg Hx      Review of Systems is noted in the HPI, as appropriate  Objective:   BP 120/88   Pulse 91   Temp (!) 97.3 F (36.3 C) (Temporal)   Ht 5' 9.5" (1.765 m)   Wt 189 lb 8 oz (  86 kg)   SpO2 96%   BMI 27.58 kg/m   GEN: No acute distress; alert,appropriate. PULM: Breathing comfortably in no respiratory distress PSYCH: Normally interactive.   ENT: I do not appreciate anything on the patient is enlarged.  No LAD  Laboratory and Imaging Data:  Assessment and Plan:     ICD-10-CM   1. Tongue lump  K14.8 Ambulatory referral to ENT  2. Need for influenza vaccination  Z23 Flu Vaccine QUAD 6+ mos PF IM (Fluarix Quad PF)   I do not visualize anything myself.  Get a set him an ENT appointment for 1  month out.  If his symptoms go away, then I do not think he really needs to keep this.  He understands what I am talking about.  No orders of the defined types were placed in this encounter.  There are no discontinued medications. Orders Placed This Encounter  Procedures  . Flu Vaccine QUAD 6+ mos PF IM (Fluarix Quad PF)  . Ambulatory referral to ENT    Follow-up: No follow-ups on file.  Signed,  Maud Deed. Talis Iwan, MD   Outpatient Encounter Medications as of 05/08/2020  Medication Sig  . atorvastatin (LIPITOR) 20 MG tablet TAKE 1 TABLET BY MOUTH EVERY DAY  . glucose blood (ONETOUCH VERIO) test strip Use to test blood sugar once daily  . ibuprofen (ADVIL) 800 MG tablet Take 1 tablet (800 mg total) by mouth 3 (three) times daily.  Marland Kitchen JANUMET XR 50-1000 MG TB24 TAKE 1 TABLET BY MOUTH EVERY DAY  . losartan (COZAAR) 25 MG tablet TAKE 1 TABLET BY MOUTH EVERY DAY  . mometasone (NASONEX) 50 MCG/ACT nasal spray Place 2 sprays into the nose daily as needed (congestion).  . montelukast (SINGULAIR) 10 MG tablet TAKE 1 TABLET BY MOUTH EVERYDAY AT BEDTIME  . Multiple Vitamin (MULTIVITAMIN WITH MINERALS) TABS tablet Take 1 tablet by mouth daily.  . ONE TOUCH LANCETS MISC Use to test blood sugar once daily  . oxyCODONE-acetaminophen (PERCOCET) 5-325 MG tablet Take 1 tablet by mouth every 6 (six) hours as needed.  . RABEprazole (ACIPHEX) 20 MG tablet TAKE 1 TABLET (20 MG TOTAL) BY MOUTH DAILY. FAILURE OF PANTOPRAZOLE, PREVACID   No facility-administered encounter medications on file as of 05/08/2020.

## 2020-05-18 ENCOUNTER — Ambulatory Visit: Payer: Federal, State, Local not specified - PPO | Attending: Internal Medicine

## 2020-05-18 DIAGNOSIS — Z23 Encounter for immunization: Secondary | ICD-10-CM

## 2020-05-18 NOTE — Progress Notes (Signed)
   Covid-19 Vaccination Clinic  Name:  Paul Flowers    MRN: [id number] DOB: 10/14/1970  05/18/2020  [name] was observed post Covid-19 immunization for 15 minutes without incident. He was provided with Vaccine Information Sheet and instruction to access the V-Safe system.   [name] was instructed to call 911 with any severe reactions post vaccine: Marland Kitchen Difficulty breathing  . Swelling of face and throat  . A fast heartbeat  . A bad rash all over body  . Dizziness and weakness   Immunizations Administered    Name Date Dose VIS Date Route   Pfizer COVID-19 Vaccine 05/18/2020 12:10 PM 0.3 mL 04/23/2020 Intramuscular   Manufacturer: [place]   Lot: [id number]   [place]: [id number]

## 2020-05-19 DIAGNOSIS — J302 Other seasonal allergic rhinitis: Secondary | ICD-10-CM | POA: Diagnosis not present

## 2020-05-19 DIAGNOSIS — G4733 Obstructive sleep apnea (adult) (pediatric): Secondary | ICD-10-CM | POA: Diagnosis not present

## 2020-05-19 DIAGNOSIS — K219 Gastro-esophageal reflux disease without esophagitis: Secondary | ICD-10-CM | POA: Diagnosis not present

## 2020-07-14 ENCOUNTER — Telehealth: Payer: Self-pay | Admitting: Family Medicine

## 2020-07-14 DIAGNOSIS — E119 Type 2 diabetes mellitus without complications: Secondary | ICD-10-CM

## 2020-07-14 DIAGNOSIS — Z125 Encounter for screening for malignant neoplasm of prostate: Secondary | ICD-10-CM

## 2020-07-14 NOTE — Telephone Encounter (Signed)
-----   Message from Cloyd Stagers, RT sent at 06/30/2020  3:25 PM EST ----- Regarding: Lab Orders for Thursday 1.13.2022 Please place lab orders for Thursday 1.13.2022, office visit for 6 month f/u on Thursday 1.20.2022 Thank you, Dyke Maes RT(R)

## 2020-07-17 ENCOUNTER — Other Ambulatory Visit: Payer: Federal, State, Local not specified - PPO

## 2020-07-24 ENCOUNTER — Ambulatory Visit: Payer: Federal, State, Local not specified - PPO | Admitting: Family Medicine

## 2020-08-01 ENCOUNTER — Encounter: Payer: Self-pay | Admitting: Family Medicine

## 2020-08-01 ENCOUNTER — Ambulatory Visit: Payer: Federal, State, Local not specified - PPO | Admitting: Family Medicine

## 2020-08-01 ENCOUNTER — Other Ambulatory Visit: Payer: Self-pay

## 2020-08-01 VITALS — BP 110/90 | HR 88 | Temp 97.2°F | Ht 69.5 in | Wt 189.0 lb

## 2020-08-01 DIAGNOSIS — I152 Hypertension secondary to endocrine disorders: Secondary | ICD-10-CM | POA: Diagnosis not present

## 2020-08-01 DIAGNOSIS — E1159 Type 2 diabetes mellitus with other circulatory complications: Secondary | ICD-10-CM

## 2020-08-01 DIAGNOSIS — E785 Hyperlipidemia, unspecified: Secondary | ICD-10-CM

## 2020-08-01 DIAGNOSIS — E1169 Type 2 diabetes mellitus with other specified complication: Secondary | ICD-10-CM | POA: Diagnosis not present

## 2020-08-01 DIAGNOSIS — E119 Type 2 diabetes mellitus without complications: Secondary | ICD-10-CM

## 2020-08-01 LAB — POCT GLYCOSYLATED HEMOGLOBIN (HGB A1C): Hemoglobin A1C: 6.2 % — AB (ref 4.0–5.6)

## 2020-08-01 NOTE — Progress Notes (Signed)
Patient ID: Paul Flowers, male    DOB: 03-09-1971, 50 y.o.   MRN: [id number]  This visit was conducted in person.  BP 110/90   Pulse 88   Temp (!) 97.2 F (36.2 C) (Temporal)   Ht 5' 9.5" (1.765 m)   Wt 189 lb (85.7 kg)   SpO2 99%   BMI 27.51 kg/m    CC D follow up   Subjective:   HPI: Paul Flowers is a 50 y.o. male presenting on 08/01/2020 for DM follow up   Hypertension:  Borderline control but had caffeine prior to measurement.  On losartan 25 mg  daily   Using medication without problems or lightheadedness:  none Chest pain with exertion: none Edema: none Short of breath:none Average home BPs: not checking Other issues:     Diabetes:   Janumet  XR50/100 Lab Results  Component Value Date   HGBA1C 6.2 (A) 08/01/2020  Using medications without difficulties: Hypoglycemic episodes: none Hyperglycemic episodes:none Feet problems: no ulcers Blood Sugars averaging:  Not chekcing eye exam within last year:   Relevant past medical, surgical, family and social history reviewed and updated as indicated. Interim medical history since our last visit reviewed. Allergies and medications reviewed and updated. Outpatient Medications Prior to Visit  Medication Sig Dispense Refill  . atorvastatin (LIPITOR) 20 MG tablet TAKE 1 TABLET BY MOUTH EVERY DAY 90 tablet 3  . glucose blood (ONETOUCH VERIO) test strip Use to test blood sugar once daily 100 each 5  . JANUMET XR 50-1000 MG TB24 TAKE 1 TABLET BY MOUTH EVERY DAY 30 tablet 11  . losartan (COZAAR) 25 MG tablet TAKE 1 TABLET BY MOUTH EVERY DAY 90 tablet 3  . mometasone (NASONEX) 50 MCG/ACT nasal spray Place 2 sprays into the nose daily as needed (congestion). 17 g 2  . montelukast (SINGULAIR) 10 MG tablet TAKE 1 TABLET BY MOUTH EVERYDAY AT BEDTIME 90 tablet 3  . Multiple Vitamin (MULTIVITAMIN WITH MINERALS) TABS tablet Take 1 tablet by mouth daily.    . ONE TOUCH LANCETS MISC Use to test blood sugar once daily 200 each 2   . RABEprazole (ACIPHEX) 20 MG tablet TAKE 1 TABLET (20 MG TOTAL) BY MOUTH DAILY. FAILURE OF PANTOPRAZOLE, PREVACID 90 tablet 3  . ibuprofen (ADVIL) 800 MG tablet Take 1 tablet (800 mg total) by mouth 3 (three) times daily. 21 tablet 0  . oxyCODONE-acetaminophen (PERCOCET) 5-325 MG tablet Take 1 tablet by mouth every 6 (six) hours as needed. 6 tablet 0   No facility-administered medications prior to visit.     Per HPI unless specifically indicated in ROS section below Review of Systems  Constitutional: Negative for fatigue and fever.  HENT: Negative for ear pain.   Eyes: Negative for pain.  Respiratory: Negative for cough and shortness of breath.   Cardiovascular: Negative for chest pain, palpitations and leg swelling.  Gastrointestinal: Negative for abdominal pain.  Genitourinary: Negative for dysuria.  Musculoskeletal: Negative for arthralgias.  Neurological: Negative for syncope, light-headedness and headaches.  Psychiatric/Behavioral: Negative for dysphoric mood.   Objective:  BP 110/90   Pulse 88   Temp (!) 97.2 F (36.2 C) (Temporal)   Ht 5' 9.5" (1.765 m)   Wt 189 lb (85.7 kg)   SpO2 99%   BMI 27.51 kg/m   Wt Readings from Last 3 Encounters:  08/01/20 189 lb (85.7 kg)  05/08/20 189 lb 8 oz (86 kg)  03/28/20 187 lb (84.8 kg)  Physical Exam Constitutional:      General: Vital signs are normal.     Appearance: He is well-developed and well-nourished.  HENT:     Head: Normocephalic.     Right Ear: Hearing normal.     Left Ear: Hearing normal.     Nose: Nose normal.     Mouth/Throat:     Mouth: Oropharynx is clear and moist and mucous membranes are normal.  Neck:     Thyroid: No thyroid mass or thyromegaly.     Vascular: No carotid bruit.     Trachea: Trachea normal.  Cardiovascular:     Rate and Rhythm: Normal rate and regular rhythm.     Pulses: Normal pulses.     Heart sounds: Heart sounds not distant. No murmur heard. No friction rub. No gallop.       Comments: No peripheral edema Pulmonary:     Effort: Pulmonary effort is normal. No respiratory distress.     Breath sounds: Normal breath sounds.  Skin:    General: Skin is warm, dry and intact.     Findings: No rash.  Psychiatric:        Mood and Affect: Mood and affect normal.        Speech: Speech normal.        Behavior: Behavior normal.        Thought Content: Thought content normal.       Diabetic foot exam: Normal inspection No skin breakdown No calluses  Normal DP pulses Normal sensation to light touch and monofilament Nails normal  Results for orders placed or performed in visit on 08/01/20  POCT glycosylated hemoglobin (Hb A1C)  Result Value Ref Range   Hemoglobin A1C 6.2 (A) 4.0 - 5.6 %   HbA1c POC (<> result, manual entry)     HbA1c, POC (prediabetic range)     HbA1c, POC (controlled diabetic range)      This visit occurred during the SARS-CoV-2 public health emergency.  Safety protocols were in place, including screening questions prior to the visit, additional usage of staff PPE, and extensive cleaning of exam room while observing appropriate contact time as indicated for disinfecting solutions.   COVID 19 screen:  No recent travel or known exposure to COVID19 The patient denies respiratory symptoms of COVID 19 at this time. The importance of social distancing was discussed today.   Assessment and Plan    Problem List Items Addressed This Visit    Controlled type 2 diabetes mellitus without complication, without long-term current use of insulin (HCC) - Primary    Stable, chronic.  Continue current medication.  Janumet XR 50/1000 mg daily      Relevant Orders   POCT glycosylated hemoglobin (Hb A1C) (Completed)   Hyperlipidemia associated with type 2 diabetes mellitus (HCC)    Stable, chronic.  Continue current medication.   Atorvastatin 20 mg daily.      Hypertension associated with diabetes ([place])    Stable at home ( elevated in office due to  caffeine), chronic.  Continue current medication.   Losartan 25 mg daily.          Paul Lofts, MD

## 2020-08-04 ENCOUNTER — Other Ambulatory Visit (INDEPENDENT_AMBULATORY_CARE_PROVIDER_SITE_OTHER): Payer: Federal, State, Local not specified - PPO

## 2020-08-04 ENCOUNTER — Other Ambulatory Visit: Payer: Self-pay

## 2020-08-04 DIAGNOSIS — E119 Type 2 diabetes mellitus without complications: Secondary | ICD-10-CM | POA: Diagnosis not present

## 2020-08-04 LAB — BASIC METABOLIC PANEL
BUN: 9 mg/dL (ref 6–23)
CO2: 27 mEq/L (ref 19–32)
Calcium: 9.5 mg/dL (ref 8.4–10.5)
Chloride: 104 mEq/L (ref 96–112)
Creatinine, Ser: 0.75 mg/dL (ref 0.40–1.50)
GFR: 104.52 mL/min (ref >60.00)
Glucose, Bld: 116 mg/dL — ABNORMAL HIGH (ref 70–99)
Potassium: 4 mEq/L (ref 3.5–5.1)
Sodium: 138 mEq/L (ref 135–145)

## 2020-08-04 LAB — HEMOGLOBIN A1C: Hgb A1c MFr Bld: 6.4 % (ref 4.6–6.5)

## 2020-08-05 ENCOUNTER — Encounter: Payer: Self-pay | Admitting: Endocrinology

## 2020-08-05 ENCOUNTER — Ambulatory Visit: Payer: Federal, State, Local not specified - PPO | Admitting: Endocrinology

## 2020-08-05 VITALS — BP 128/72 | HR 99 | Ht 70.0 in | Wt 191.0 lb

## 2020-08-05 DIAGNOSIS — E119 Type 2 diabetes mellitus without complications: Secondary | ICD-10-CM

## 2020-08-05 DIAGNOSIS — E782 Mixed hyperlipidemia: Secondary | ICD-10-CM

## 2020-08-05 NOTE — Progress Notes (Signed)
Patient ID: Paul Flowers, male   DOB: 1970/12/18, 50 y.o.   MRN: [id number]            Reason for Appointment: Follow-up of diabetes  Referring physician: Diona Browner    History of Present Illness:          Date of diagnosis of type 2 diabetes mellitus: 12/17        Background history:   He had been told to have prediabetes about 2 years ago with highest A1c only 6.5 but no significant hyperglycemia He had not been following any particular diet or exercising and his A1c initially came down to 6.1 However in 12/17 his A1c was up to 6.8 In the third week of January 2018 he started having fatigue, severely increased thirst, frequent urination and was found to have marked hyperglycemia with blood sugar of 620 without Acidosis , had small ketones in the urine  He was switched from metformin to Janumet XR on his initial consultation in 08/2016 because of diarrhea with metformin  Recent history:   Non-insulin hypoglycemic drugs the patient is taking are: Janumet XR 100/1000 daily at dinnertime   His A1c is 6.4, previously 6.2    Current management, blood sugar patterns and problems identified:   He did bring his monitor for download but again he is using the insurance provided meter  As before he has checked his blood sugar very rarely and only twice in the last month  He had a fasting reading of 128 and another reading of 68 at lunchtime  Not clear if his test strips are expired  He said that he will occasionally feel a little shaky but only if he has cereal in the morning and may feel this before lunchtime.  This is only about once a month  He is not doing or does tolerated not very active  However his weight is almost the same  Blood sugars appear to be fairly good with lab glucose 116 fasting  He thinks he is generally trying to cut back on portions and carbohydrates         Side effects from medications have been: diarrhea from metformin  Glucose monitoring:  done  0.5 times a day         Glucometer:  [place]  Blood Glucose readings as above     Self-care: The diet that the patient has been following is: tries to limit  Carbohydrates, sweets and drinks with sugar.     Meal times are:  Breakfast is at  7 AM Lunch: at work during [employer]: 8 pm                Dietician visit, most recent: 10/2016  Weight history:  Wt Readings from Last 3 Encounters:  08/05/20 191 lb (86.6 kg)  08/01/20 189 lb (85.7 kg)  05/08/20 189 lb 8 oz (86 kg)    Glycemic control:   Lab Results  Component Value Date   HGBA1C 6.4 08/04/2020   HGBA1C 6.2 (A) 08/01/2020   HGBA1C 6.3 01/29/2020   Lab Results  Component Value Date   MICROALBUR <0.7 01/29/2020   LDLCALC 65 01/29/2020   CREATININE 0.75 08/04/2020   Lab Results  Component Value Date   MICRALBCREAT 2.5 01/29/2020    Lab Results  Component Value Date   FRUCTOSAMINE 277 09/15/2016     Allergies as of 08/05/2020   No Known Allergies     Medication List  Accurate as of August 05, 2020  9:31 PM. If you have any questions, ask your nurse or doctor.        atorvastatin 20 MG tablet Commonly known as: LIPITOR TAKE 1 TABLET BY MOUTH EVERY DAY   glucose blood test strip Commonly known as: OneTouch Verio Use to test blood sugar once daily   Janumet XR 50-1000 MG Tb24 Generic drug: SitaGLIPtin-MetFORMIN HCl TAKE 1 TABLET BY MOUTH EVERY DAY   losartan 25 MG tablet Commonly known as: COZAAR TAKE 1 TABLET BY MOUTH EVERY DAY   mometasone 50 MCG/ACT nasal spray Commonly known as: NASONEX Place 2 sprays into the nose daily as needed (congestion).   montelukast 10 MG tablet Commonly known as: SINGULAIR TAKE 1 TABLET BY MOUTH EVERYDAY AT BEDTIME   multivitamin with minerals Tabs tablet Take 1 tablet by mouth daily.   ONE TOUCH LANCETS Misc Use to test blood sugar once daily   RABEprazole 20 MG tablet Commonly known as: ACIPHEX TAKE 1 TABLET (20 MG TOTAL) BY MOUTH DAILY.  FAILURE OF PANTOPRAZOLE, PREVACID       Allergies: No Known Allergies  Past Medical History:  Diagnosis Date  . Allergic rhinitis   . ALLERGIC RHINITIS 01/19/2007  . Diabetes ([place])    diet controlled  . GERD 01/19/2007  . GERD (gastroesophageal reflux disease)   . Hypertension    Slight  . Renal disorder   . Sleep apnea 01/29/2011    Past Surgical History:  Procedure Laterality Date  . COLONOSCOPY WITH PROPOFOL N/A 02/25/2020   Procedure: COLONOSCOPY WITH PROPOFOL;  Surgeon: Jonathon Bellows, MD;  Location: [facility] ENDOSCOPY;  Service: Gastroenterology;  Laterality: N/A;  . CORONARY ANGIOPLASTY     no blockage  . CYSTOSCOPY/URETEROSCOPY/HOLMIUM LASER/STENT PLACEMENT Right 02/02/2018   Procedure: CYSTOSCOPY/URETEROSCOPY//STENT PLACEMENT;  Surgeon: Cleon Gustin, MD;  Location: [facility] ORS;  Service: Urology;  Laterality: Right;  . LEFT HEART CATHETERIZATION WITH CORONARY ANGIOGRAM N/A 12/11/2013   Procedure: LEFT HEART CATHETERIZATION WITH CORONARY ANGIOGRAM;  Surgeon: Josue Hector, MD;  Location[facility] CATH LAB;  Service: Cardiovascular;  Laterality: N/A;    Family History  Problem Relation Age of Onset  . Hypertension Mother   . Heart disease Father 63  . Hyperlipidemia Father   . Colonic polyp Father   . Diabetes Father   . Diabetes Maternal Grandmother   . Heart disease Maternal Grandmother   . Stroke Maternal Grandfather   . Colon cancer Neg Hx     Social History:  reports that he has never smoked. He has never used smokeless tobacco. He reports current alcohol use. He reports that he does not use drugs.   Review of Systems   Lipid history: He has been continuing Lipitor 20 mg daily for baseline LDL of 141  Last results are as follows:   Lab Results  Component Value Date   CHOL 136 01/29/2020   HDL 59.70 01/29/2020   LDLCALC 65 01/29/2020   LDLDIRECT 149.3 02/12/2013   TRIG 56.0 01/29/2020   CHOLHDL 2 01/29/2020           Hypertension:  treated with  25 mg  losartan, prescribed by PCP   BP Readings from Last 3 Encounters:  08/05/20 128/72  08/01/20 110/90  05/08/20 120/88     Most recent eye exam was In 3/21, negative  Last foot exam 2/22  Physical Examination:  BP 128/72   Pulse 99   Ht 5\' 10"  (1.778 m)   Wt 191 lb (86.6 kg)  SpO2 94%   BMI 27.41 kg/m   Diabetic Foot Exam - Simple   Simple Foot Form Diabetic Foot exam was performed with the following findings: Yes   Visual Inspection No deformities, no ulcerations, no other skin breakdown bilaterally: Yes Sensation Testing Intact to touch and monofilament testing bilaterally: Yes Pulse Check Posterior Tibialis and Dorsalis pulse intact bilaterally: Yes Comments        ASSESSMENT:  Diabetes type 2, Nonobese  See history of present illness for detailed discussion of current diabetes management, blood sugar patterns and problems identified   His A1c is excellent at 6.4 and again near normal   He has been on Janumet XR 100/1000 1 tablet daily as only treatment for nearly 4 years now He is not doing much exercise but he has maintained his weight and likely not going off his diet very often Not clear if his test strips are accurate as he had a fasting reading of 128 Otherwise not motivated to check his sugars  LIPIDS: LDL previously below 100 on Lipitor  Mild hypertension: Well-controlled  PLAN:    He will continue Janumet XR 100/1000 1 tablet at dinnertime  Check blood sugars regularly after meals at least once a week  Encouraged him to start a walking program for exercise  Continue follow-up with PCP and have lipids rechecked on the next visit  Follow-up in 6 months   Patient Instructions  Fax # [phone]  Check blood sugars on waking up 1-2 days a week  Also check blood sugars about 2 hours after meals and do this after different meals by rotation  Recommended blood sugar levels on waking up are 90-130 and about 2 hours after meal is  130-160  Please bring your blood sugar monitor to each visit, thank you  Walk daily     Elayne Snare 08/05/2020, 9:31 PM   Note: This office note was prepared with Dragon voice recognition system technology. Any transcriptional errors that result from this process are unintentional.

## 2020-08-05 NOTE — Patient Instructions (Signed)
Fax # [phone]  Check blood sugars on waking up 1-2 days a week  Also check blood sugars about 2 hours after meals and do this after different meals by rotation  Recommended blood sugar levels on waking up are 90-130 and about 2 hours after meal is 130-160  Please bring your blood sugar monitor to each visit, thank you  Walk daily

## 2020-08-07 ENCOUNTER — Ambulatory Visit: Payer: Federal, State, Local not specified - PPO | Admitting: Endocrinology

## 2020-08-30 ENCOUNTER — Other Ambulatory Visit: Payer: Self-pay | Admitting: Family Medicine

## 2020-09-08 NOTE — Assessment & Plan Note (Signed)
Stable, chronic.  Continue current medication. ? ? ?Atorvastatin 20 mg daily ?

## 2020-09-08 NOTE — Assessment & Plan Note (Signed)
Stable, chronic.  Continue current medication.  Janumet XR 50/1000 mg daily

## 2020-09-08 NOTE — Assessment & Plan Note (Addendum)
Stable at home ( elevated in office due to caffeine), chronic.  Continue current medication.   Losartan 25 mg daily.

## 2020-09-10 LAB — HM DIABETES EYE EXAM

## 2020-09-20 ENCOUNTER — Other Ambulatory Visit: Payer: Self-pay | Admitting: Family Medicine

## 2020-09-22 ENCOUNTER — Encounter: Payer: Self-pay | Admitting: Family Medicine

## 2020-09-22 NOTE — Telephone Encounter (Signed)
Left message for Paul Flowers to return my call.  Need to verify he does want to change from Aciphex to Omeprazole.

## 2020-09-22 NOTE — Telephone Encounter (Signed)
Spoke with Paul Flowers.  He would like to change to omeprazole if appropriate due to it being cheaper.

## 2020-09-27 DIAGNOSIS — G4733 Obstructive sleep apnea (adult) (pediatric): Secondary | ICD-10-CM | POA: Diagnosis not present

## 2020-10-16 DIAGNOSIS — G4733 Obstructive sleep apnea (adult) (pediatric): Secondary | ICD-10-CM | POA: Diagnosis not present

## 2020-10-28 DIAGNOSIS — G4733 Obstructive sleep apnea (adult) (pediatric): Secondary | ICD-10-CM | POA: Diagnosis not present

## 2020-11-03 ENCOUNTER — Encounter: Payer: Self-pay | Admitting: Endocrinology

## 2020-11-04 ENCOUNTER — Ambulatory Visit: Payer: Federal, State, Local not specified - PPO | Admitting: Family Medicine

## 2020-11-04 ENCOUNTER — Other Ambulatory Visit: Payer: Self-pay

## 2020-11-04 ENCOUNTER — Encounter: Payer: Self-pay | Admitting: Family Medicine

## 2020-11-04 VITALS — BP 130/76 | HR 95 | Temp 98.2°F | Ht 69.5 in | Wt 196.8 lb

## 2020-11-04 DIAGNOSIS — M5442 Lumbago with sciatica, left side: Secondary | ICD-10-CM | POA: Diagnosis not present

## 2020-11-04 DIAGNOSIS — M5441 Lumbago with sciatica, right side: Secondary | ICD-10-CM | POA: Diagnosis not present

## 2020-11-04 MED ORDER — CYCLOBENZAPRINE HCL 10 MG PO TABS: 10.0000 mg | ORAL_TABLET | Freq: Every evening | ORAL | 0 refills | Status: DC | PRN

## 2020-11-04 MED ORDER — PREDNISONE 20 MG PO TABS: ORAL_TABLET | ORAL | 0 refills | Status: DC

## 2020-11-04 NOTE — Patient Instructions (Addendum)
Start home physical therapy. Heat on low back.  Start prednisone taper and can use muscle relaxant at night.  Hold ibuprofen for now but can restart as needed after prednisone taper.  If not improving in 2-4 weeks follow up for re-eval. 

## 2020-11-04 NOTE — Assessment & Plan Note (Signed)
Start home physical therapy. Heat on low back.  Start prednisone taper and can use muscle relaxant at night.  Hold ibuprofen for now but can restart as needed after prednisone taper.  If not improving in 2-4 weeks follow up for re-eval.

## 2020-11-04 NOTE — Progress Notes (Signed)
Patient ID: Paul Flowers, male    DOB: 1970/08/09, 50 y.o.   MRN: [id number]  This visit was conducted in person.  BP 130/76   Pulse 95   Temp 98.2 F (36.8 C) (Temporal)   Ht 5' 9.5" (1.765 m)   Wt 196 lb 12 oz (89.2 kg)   SpO2 97%   BMI 28.64 kg/m    CC:  Chief Complaint  Patient presents with  . Back Pain    X 2 weeks    Subjective:   HPI: Paul Flowers is a 50 y.o. male presenting on 11/04/2020 for Back Pain (X 2 weeks)  He has had some off and on low back pain in last year.  Now in last 2 weeks after yard work/weeding.. triggered low back pain. Across low back.. more on right. Occ pain radiating  bilateral buttocks... no radation to legs. Pain increased with standing, walking, bending.   No numbness and weakness.   Has tried ibuprofen 800 mg .. helps temporarily. Has tried biofreeze. occ stretching.    No personal history of back issues  NO falls, no MVA.  Relevant past medical, surgical, family and social history reviewed and updated as indicated. Interim medical history since our last visit reviewed. Allergies and medications reviewed and updated. Outpatient Medications Prior to Visit  Medication Sig Dispense Refill  . atorvastatin (LIPITOR) 20 MG tablet TAKE 1 TABLET BY MOUTH EVERY DAY 90 tablet 3  . glucose blood (ONETOUCH VERIO) test strip Use to test blood sugar once daily 100 each 5  . JANUMET XR 50-1000 MG TB24 TAKE 1 TABLET BY MOUTH EVERY DAY 30 tablet 11  . losartan (COZAAR) 25 MG tablet TAKE 1 TABLET BY MOUTH EVERY DAY 90 tablet 3  . mometasone (NASONEX) 50 MCG/ACT nasal spray PLACE 2 SPRAYS INTO THE NOSE DAILY AS NEEDED (CONGESTION). 51 g 3  . montelukast (SINGULAIR) 10 MG tablet TAKE 1 TABLET BY MOUTH EVERYDAY AT BEDTIME 90 tablet 3  . Multiple Vitamin (MULTIVITAMIN WITH MINERALS) TABS tablet Take 1 tablet by mouth daily.    Marland Kitchen omeprazole (PRILOSEC) 20 MG capsule Take 1 capsule (20 mg total) by mouth daily. 90 capsule 3  . ONE TOUCH LANCETS  MISC Use to test blood sugar once daily 200 each 2   No facility-administered medications prior to visit.     Per HPI unless specifically indicated in ROS section below Review of Systems Objective:  BP 130/76   Pulse 95   Temp 98.2 F (36.8 C) (Temporal)   Ht 5' 9.5" (1.765 m)   Wt 196 lb 12 oz (89.2 kg)   SpO2 97%   BMI 28.64 kg/m   Wt Readings from Last 3 Encounters:  11/04/20 196 lb 12 oz (89.2 kg)  08/05/20 191 lb (86.6 kg)  08/01/20 189 lb (85.7 kg)      Physical Exam Constitutional:      Appearance: He is well-developed.  HENT:     Head: Normocephalic.     Right Ear: Hearing normal.     Left Ear: Hearing normal.     Nose: Nose normal.  Neck:     Thyroid: No thyroid mass or thyromegaly.     Vascular: No carotid bruit.     Trachea: Trachea normal.  Cardiovascular:     Rate and Rhythm: Normal rate and regular rhythm.     Pulses: Normal pulses.     Heart sounds: Heart sounds not distant. No murmur heard. No friction rub. No  gallop.      Comments: No peripheral edema Pulmonary:     Effort: Pulmonary effort is normal. No respiratory distress.     Breath sounds: Normal breath sounds.  Musculoskeletal:     Lumbar back: Spasms and tenderness present. No swelling or bony tenderness. Decreased range of motion. Negative right straight leg raise test and negative left straight leg raise test.  Skin:    General: Skin is warm and dry.     Findings: No rash.  Psychiatric:        Speech: Speech normal.        Behavior: Behavior normal.        Thought Content: Thought content normal.       Results for orders placed or performed in visit on 11/03/20  HM DIABETES EYE EXAM  Result Value Ref Range   HM Diabetic Eye Exam No Retinopathy No Retinopathy    This visit occurred during the SARS-CoV-2 public health emergency.  Safety protocols were in place, including screening questions prior to the visit, additional usage of staff PPE, and extensive cleaning of exam room while  observing appropriate contact time as indicated for disinfecting solutions.   COVID 19 screen:  No recent travel or known exposure to COVID19 The patient denies respiratory symptoms of COVID 19 at this time. The importance of social distancing was discussed today.   Assessment and Plan    Problem List Items Addressed This Visit    Acute bilateral low back pain with bilateral sciatica - Primary     Start home physical therapy. Heat on low back.  Start prednisone taper and can use muscle relaxant at night.  Hold ibuprofen for now but can restart as needed after prednisone taper.  If not improving in 2-4 weeks follow up for re-eval.      Relevant Medications   predniSONE (DELTASONE) 20 MG tablet   cyclobenzaprine (FLEXERIL) 10 MG tablet       Eliezer Lofts, MD

## 2020-11-18 ENCOUNTER — Ambulatory Visit: Payer: Federal, State, Local not specified - PPO | Admitting: Family Medicine

## 2020-11-18 ENCOUNTER — Other Ambulatory Visit: Payer: Self-pay

## 2020-11-18 ENCOUNTER — Encounter: Payer: Self-pay | Admitting: Family Medicine

## 2020-11-18 ENCOUNTER — Ambulatory Visit (INDEPENDENT_AMBULATORY_CARE_PROVIDER_SITE_OTHER)
Admission: RE | Admit: 2020-11-18 | Discharge: 2020-11-18 | Disposition: A | Discharge status: Home / Self Care | Payer: Federal, State, Local not specified - PPO | Source: Ambulatory Visit | Attending: Family Medicine | Admitting: Family Medicine

## 2020-11-18 VITALS — BP 122/78 | HR 107 | Temp 97.7°F | Ht 69.5 in | Wt 195.0 lb

## 2020-11-18 DIAGNOSIS — M5441 Lumbago with sciatica, right side: Secondary | ICD-10-CM

## 2020-11-18 DIAGNOSIS — M5442 Lumbago with sciatica, left side: Secondary | ICD-10-CM | POA: Diagnosis not present

## 2020-11-18 DIAGNOSIS — M545 Low back pain, unspecified: Secondary | ICD-10-CM | POA: Diagnosis not present

## 2020-11-18 MED ORDER — DICLOFENAC SODIUM 75 MG PO TBEC: 75.0000 mg | DELAYED_RELEASE_TABLET | Freq: Two times a day (BID) | ORAL | 0 refills | Status: DC

## 2020-11-18 NOTE — Progress Notes (Signed)
Patient ID: LAVERE STORK, male    DOB: [birth year], 50 y.o.   MRN: [id number]  This visit was conducted in person.  BP 122/78   Pulse (!) 107   Temp 97.7 F (36.5 C) (Temporal)   Ht 5' 9.5" (1.765 m)   Wt 195 lb (88.5 kg)   SpO2 97%   BMI 28.38 kg/m    CC:  Chief Complaint  Patient presents with  . Back Pain    Subjective:   HPI: Paul Flowers is a 50 y.o. male presenting on 11/18/2020 for Back Pain   Seen on 5/3 with new onset low back pain.. pain ongoing now for 4 week for current flare... intermittent pain for 1 year.  Dx with sciatica Treated with prednisone taper, muscle relaxant, gentle stretching and heat.    He reports minimal improvement in pain in low back pain. Also minimal improvement with ibuprofen and biofreeze.   Pain tightness and pressure  In right lumbar spine.  No further pain shooting into legs.  No numbness, no weakness in legs.  No new falls.  Using heating pad and bio-freeze at this point.     Relevant past medical, surgical, family and social history reviewed and updated as indicated. Interim medical history since our last visit reviewed. Allergies and medications reviewed and updated. Outpatient Medications Prior to Visit  Medication Sig Dispense Refill  . atorvastatin (LIPITOR) 20 MG tablet TAKE 1 TABLET BY MOUTH EVERY DAY 90 tablet 3  . cyclobenzaprine (FLEXERIL) 10 MG tablet Take 1 tablet (10 mg total) by mouth at bedtime as needed for muscle spasms. 15 tablet 0  . glucose blood (ONETOUCH VERIO) test strip Use to test blood sugar once daily 100 each 5  . JANUMET XR 50-1000 MG TB24 TAKE 1 TABLET BY MOUTH EVERY DAY 30 tablet 11  . losartan (COZAAR) 25 MG tablet TAKE 1 TABLET BY MOUTH EVERY DAY 90 tablet 3  . mometasone (NASONEX) 50 MCG/ACT nasal spray PLACE 2 SPRAYS INTO THE NOSE DAILY AS NEEDED (CONGESTION). 51 g 3  . montelukast (SINGULAIR) 10 MG tablet TAKE 1 TABLET BY MOUTH EVERYDAY AT BEDTIME 90 tablet 3  . Multiple Vitamin  (MULTIVITAMIN WITH MINERALS) TABS tablet Take 1 tablet by mouth daily.    Marland Kitchen omeprazole (PRILOSEC) 20 MG capsule Take 1 capsule (20 mg total) by mouth daily. 90 capsule 3  . ONE TOUCH LANCETS MISC Use to test blood sugar once daily 200 each 2  . predniSONE (DELTASONE) 20 MG tablet 3 tabs by mouth daily x 3 days, then 2 tabs by mouth daily x 2 days then 1 tab by mouth daily x 2 days 15 tablet 0   No facility-administered medications prior to visit.     Per HPI unless specifically indicated in ROS section below Review of Systems  Constitutional: Negative for fatigue and fever.  HENT: Negative for ear pain.   Eyes: Negative for pain.  Respiratory: Negative for cough and shortness of breath.   Cardiovascular: Negative for chest pain, palpitations and leg swelling.  Gastrointestinal: Negative for abdominal pain.  Genitourinary: Negative for dysuria.  Musculoskeletal: Negative for arthralgias.  Neurological: Negative for syncope, light-headedness and headaches.  Psychiatric/Behavioral: Negative for dysphoric mood.   Objective:  BP 122/78   Pulse (!) 107   Temp 97.7 F (36.5 C) (Temporal)   Ht 5' 9.5" (1.765 m)   Wt 195 lb (88.5 kg)   SpO2 97%   BMI 28.38 kg/m   Wt Readings  from Last 3 Encounters:  11/18/20 195 lb (88.5 kg)  11/04/20 196 lb 12 oz (89.2 kg)  08/05/20 191 lb (86.6 kg)      Physical Exam Constitutional:      Appearance: He is well-developed.  HENT:     Head: Normocephalic.     Right Ear: Hearing normal.     Left Ear: Hearing normal.     Nose: Nose normal.  Neck:     Thyroid: No thyroid mass or thyromegaly.     Vascular: No carotid bruit.     Trachea: Trachea normal.  Cardiovascular:     Rate and Rhythm: Normal rate and regular rhythm.     Pulses: Normal pulses.     Heart sounds: Heart sounds not distant. No murmur heard. No friction rub. No gallop.      Comments: No peripheral edema Pulmonary:     Effort: Pulmonary effort is normal. No respiratory  distress.     Breath sounds: Normal breath sounds.  Musculoskeletal:     Cervical back: Normal.     Thoracic back: Normal.     Lumbar back: Tenderness present. No bony tenderness. Decreased range of motion. Positive right straight leg raise test. Negative left straight leg raise test.  Skin:    General: Skin is warm and dry.     Findings: No rash.  Psychiatric:        Speech: Speech normal.        Behavior: Behavior normal.        Thought Content: Thought content normal.       Results for orders placed or performed in visit on 11/03/20  HM DIABETES EYE EXAM  Result Value Ref Range   HM Diabetic Eye Exam No Retinopathy No Retinopathy    This visit occurred during the SARS-CoV-2 public health emergency.  Safety protocols were in place, including screening questions prior to the visit, additional usage of staff PPE, and extensive cleaning of exam room while observing appropriate contact time as indicated for disinfecting solutions.   COVID 19 screen:  No recent travel or known exposure to COVID19 The patient denies respiratory symptoms of COVID 19 at this time. The importance of social distancing was discussed today.   Assessment and Plan Problem List Items Addressed This Visit    Acute bilateral low back pain with bilateral sciatica - Primary    Eval with x-ray given minimal improvement with heat, stretching, prednisone, Biofreeze, ibuprofen, muscle relaxant.  Start diclofenac twice daily, continue heat and home stretching but plan PT referral if not improving further.  If fails all these treatments consider MRI to eval... family history of spinal stenosis in father and sister.      Relevant Medications   diclofenac (VOLTAREN) 75 MG EC tablet   Other Relevant Orders   DG Lumbar Spine Complete     Meds ordered this encounter  Medications  . diclofenac (VOLTAREN) 75 MG EC tablet    Sig: Take 1 tablet (75 mg total) by mouth 2 (two) times daily.    Dispense:  30 tablet     Refill:  0       Eliezer Lofts, MD

## 2020-11-18 NOTE — Assessment & Plan Note (Signed)
Eval with x-ray given minimal improvement with heat, stretching, prednisone, Biofreeze, ibuprofen, muscle relaxant.  Start diclofenac twice daily, continue heat and home stretching but plan PT referral if not improving further.  If fails all these treatments consider MRI to eval... family history of spinal stenosis in father and sister.

## 2020-11-18 NOTE — Patient Instructions (Signed)
Call with X-ray results.  Continue heat and gentle stretching. Start diclofenac twice daily. If not improving we will move forward with physical therapy referral.

## 2020-11-24 DIAGNOSIS — G4733 Obstructive sleep apnea (adult) (pediatric): Secondary | ICD-10-CM | POA: Diagnosis not present

## 2020-11-27 DIAGNOSIS — G4733 Obstructive sleep apnea (adult) (pediatric): Secondary | ICD-10-CM | POA: Diagnosis not present

## 2020-12-03 ENCOUNTER — Emergency Department (HOSPITAL_COMMUNITY): Payer: Federal, State, Local not specified - PPO

## 2020-12-03 ENCOUNTER — Encounter (HOSPITAL_COMMUNITY): Payer: Self-pay | Admitting: *Deleted

## 2020-12-03 ENCOUNTER — Telehealth: Payer: Self-pay

## 2020-12-03 ENCOUNTER — Telehealth: Payer: Self-pay | Admitting: Endocrinology

## 2020-12-03 ENCOUNTER — Emergency Department (HOSPITAL_COMMUNITY)
Admission: EM | Admit: 2020-12-03 | Discharge: 2020-12-03 | Disposition: A | Discharge status: Home / Self Care | Payer: Federal, State, Local not specified - PPO | Attending: Emergency Medicine | Admitting: Emergency Medicine

## 2020-12-03 DIAGNOSIS — E1165 Type 2 diabetes mellitus with hyperglycemia: Secondary | ICD-10-CM | POA: Insufficient documentation

## 2020-12-03 DIAGNOSIS — R0789 Other chest pain: Secondary | ICD-10-CM | POA: Diagnosis not present

## 2020-12-03 DIAGNOSIS — R42 Dizziness and giddiness: Secondary | ICD-10-CM | POA: Insufficient documentation

## 2020-12-03 DIAGNOSIS — R519 Headache, unspecified: Secondary | ICD-10-CM | POA: Diagnosis not present

## 2020-12-03 DIAGNOSIS — R739 Hyperglycemia, unspecified: Secondary | ICD-10-CM | POA: Diagnosis not present

## 2020-12-03 DIAGNOSIS — I10 Essential (primary) hypertension: Secondary | ICD-10-CM | POA: Diagnosis not present

## 2020-12-03 DIAGNOSIS — R079 Chest pain, unspecified: Secondary | ICD-10-CM | POA: Diagnosis not present

## 2020-12-03 LAB — URINALYSIS, ROUTINE W REFLEX MICROSCOPIC
Bilirubin Urine: NEGATIVE
Glucose, UA: 150 mg/dL — AB
Hgb urine dipstick: NEGATIVE
Ketones, ur: NEGATIVE mg/dL
Leukocytes,Ua: NEGATIVE
Nitrite: NEGATIVE
Protein, ur: NEGATIVE mg/dL
Specific Gravity, Urine: 1.017 (ref 1.005–1.030)
pH: 6 (ref 5.0–8.0)

## 2020-12-03 LAB — BASIC METABOLIC PANEL
Anion gap: 8 (ref 5–15)
BUN: 11 mg/dL (ref 6–20)
CO2: 25 mmol/L (ref 22–32)
Calcium: 9.1 mg/dL (ref 8.9–10.3)
Chloride: 104 mmol/L (ref 98–111)
Creatinine, Ser: 0.6 mg/dL — ABNORMAL LOW (ref 0.61–1.24)
GFR, Estimated: >60 mL/min (ref >60)
Glucose, Bld: 260 mg/dL — ABNORMAL HIGH (ref 70–99)
Potassium: 3.7 mmol/L (ref 3.5–5.1)
Sodium: 137 mmol/L (ref 135–145)

## 2020-12-03 LAB — CBC
HCT: 40.6 % (ref 39.0–52.0)
Hemoglobin: 13.9 g/dL (ref 13.0–17.0)
MCH: 29.2 pg (ref 26.0–34.0)
MCHC: 34.2 g/dL (ref 30.0–36.0)
MCV: 85.3 fL (ref 80.0–100.0)
Platelets: 296 10*3/uL (ref 150–400)
RBC: 4.76 MIL/uL (ref 4.22–5.81)
RDW: 12.1 % (ref 11.5–15.5)
WBC: 4.8 10*3/uL (ref 4.0–10.5)
nRBC: 0 % (ref 0.0–0.2)

## 2020-12-03 LAB — CBG MONITORING, ED
Glucose-Capillary: 207 mg/dL — ABNORMAL HIGH (ref 70–99)
Glucose-Capillary: 120 mg/dL — ABNORMAL HIGH (ref 70–99)

## 2020-12-03 MED ORDER — KETOROLAC TROMETHAMINE 30 MG/ML IJ SOLN
30.0000 mg | Freq: Once | INTRAMUSCULAR | Status: AC
  Administered 2020-12-03: 30 mg via INTRAVENOUS
  Filled 2020-12-03: qty 1

## 2020-12-03 MED ORDER — SODIUM CHLORIDE 0.9 % IV BOLUS
1000.0000 mL | Freq: Once | INTRAVENOUS | Status: AC
  Administered 2020-12-03: 1000 mL via INTRAVENOUS

## 2020-12-03 NOTE — ED Triage Notes (Signed)
Pt reports he recently started checking his blood sugar again over the past 2 weeks and has had abnormal readings in the 200s. He reports he has been taking his diabetes medications. His blood sugar was 402 this morning, he reports headache and feeling lightheaded.

## 2020-12-03 NOTE — Telephone Encounter (Signed)
Patient called in and stated that for the past couple of weeks he has checked his blood sugars (mostly two hours after eating or sometimes before meals) and they range from 140-272. Patient stated that starting Monday he began experiencing lightheadedness, lethargy, headache and intermittent chest pain. Patient denied these symptoms at this time. Patient also denied missing his medication, infection, diet change, or any other factors that would cause an increase in blood glucose readings. Patient checked his blood sugar two hours after eating this morning and it was 402. Patient stated that he called Dr. Ronnie Derby office and has an appointment scheduled for Wednesday (6/8). Patient is very anxious and wanting to know what he should do. Advised patient that he should call and speak to nurse at Dr. Ronnie Derby office and if he experienced new or worsening symptoms he should go to ED. Patient verbalized understanding. Will call and F/U with patient this afternoon.

## 2020-12-03 NOTE — ED Provider Notes (Signed)
Emergency Medicine Provider Triage Evaluation Note  SHAFTER JUPIN , a 50 y.o. male  was evaluated in triage.  Pt complains of hypoglycemia.  States that his blood sugars have been running in the 200s.  This morning was in the 400s.  He does take diabetes medication but is not on any insulin.  Reports lightheadedness and dizziness but no vomiting or fever.  Review of Systems  Positive: Lightheaded, dizziness Negative: Vomiting, fever  Physical Exam  BP (!) 132/112 (BP Location: Right Arm)   Pulse 94   Temp 98.3 F (36.8 C) (Oral)   Resp 18   Ht 5\' 9"  (1.753 m)   Wt 88.5 kg   SpO2 100%   BMI 28.80 kg/m  Gen:   Awake, no distress   Resp:  Normal effort  MSK:   Moves extremities without difficulty  Other:  No facial asymmetry  Medical Decision Making  Medically screening exam initiated at 12:21 PM.  Appropriate orders placed.  Adalberto Cole was informed that the remainder of the evaluation will be completed by another provider, this initial triage assessment does not replace that evaluation, and the importance of remaining in the ED until their evaluation is complete.  Lab work ordered   Delia Heady, PA-C 12/03/20 1222    Malvin Johns, MD 12/03/20 1313

## 2020-12-03 NOTE — ED Provider Notes (Signed)
[facility] DEPT Provider Note   CSN: [id number] Arrival date & time: 12/03/20  1131     History Chief Complaint  Patient presents with  . Hyperglycemia    Paul Flowers is a 50 y.o. male.  He is here with complaint of elevated blood sugars for the last few weeks.  He has been diabetic for 4 years.  Recently he stopped checking his blood sugars and was not adhering to a diabetic diet.  He started checking his blood sugars a few weeks ago and found them to be elevated.  He called his endocrinologist and has an appointment next week but his blood sugar was greater than 400 this morning.  He felt little lightheaded and dizzy.  No recent illness symptoms.  He is also had some intermittent right-sided chest pain.  None today.  He is also had daily headaches on the left posterior that have been going on for a few months.  He is concerned it may be a stroke  The history is provided by the patient.  Hyperglycemia Blood sugar level PTA:  400 Severity:  Moderate Onset quality:  Gradual Timing:  Intermittent Progression:  Unchanged Chronicity:  New Diabetes status:  Controlled with oral medications Current diabetic therapy:  Janumet Context: noncompliance   Context: not change in medication   Relieved by:  Nothing Ineffective treatments:  Diet Associated symptoms: chest pain and dizziness   Associated symptoms: no abdominal pain, no blurred vision, no dysuria, no fever, no nausea, no shortness of breath, no syncope, no vomiting and no weakness        Past Medical History:  Diagnosis Date  . Allergic rhinitis   . ALLERGIC RHINITIS 01/19/2007  . Diabetes ([place])    diet controlled  . GERD 01/19/2007  . GERD (gastroesophageal reflux disease)   . Hypertension    Slight  . Renal disorder   . Sleep apnea 01/29/2011    Patient Active Problem List   Diagnosis Date Noted  . Acute bilateral low back pain with bilateral sciatica 11/04/2020  . Current moderate  episode of major depressive disorder without prior episode ([place]) 03/28/2019  . Bilateral foot pain 01/13/2018  . Hyperlipidemia associated with type 2 diabetes mellitus ([place]) 06/11/2015  . 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 Controlled type 2 diabetes mellitus without complication, without long-term current use of insulin ([place]) 09/24/2014  . Hypertension associated with diabetes ([place]) 11/09/2013  . Routine health maintenance 02/12/2013  . OSA (obstructive sleep apnea) 03/19/2011  . Allergic rhinitis 01/19/2007  . GERD 01/19/2007    Past Surgical History:  Procedure Laterality Date  . COLONOSCOPY WITH PROPOFOL N/A 02/25/2020   Procedure: COLONOSCOPY WITH PROPOFOL;  Surgeon: Jonathon Bellows, MD;  Location: [facility] ENDOSCOPY;  Service: Gastroenterology;  Laterality: N/A;  . CORONARY ANGIOPLASTY     no blockage  . CYSTOSCOPY/URETEROSCOPY/HOLMIUM LASER/STENT PLACEMENT Right 02/02/2018   Procedure: CYSTOSCOPY/URETEROSCOPY//STENT PLACEMENT;  Surgeon: Cleon Gustin, MD;  Location: [facility] ORS;  Service: Urology;  Laterality: Right;  . LEFT HEART CATHETERIZATION WITH CORONARY ANGIOGRAM N/A 12/11/2013   Procedure: LEFT HEART CATHETERIZATION WITH CORONARY ANGIOGRAM;  Surgeon: Josue Hector, MD;  Location: [facility] CATH LAB;  Service: Cardiovascular;  Laterality: N/A;       Family History  Problem Relation Age of Onset  . Hypertension Mother   . Heart disease Father 73  . Hyperlipidemia Father   . Colonic polyp Father   . Diabetes Father   . Diabetes Maternal Grandmother   .  Heart disease Maternal Grandmother   . Stroke Maternal Grandfather   . Colon cancer Neg Hx     Social History   Tobacco Use  . Smoking status: Never Smoker  . Smokeless tobacco: Never Used  Vaping Use  . Vaping Use: Never used  Substance Use Topics  . Alcohol use: Yes    Alcohol/week: 0.0 standard drinks    Comment: occ  . Drug use: No    Home Medications Prior to Admission medications   Medication Sig Start  Date End Date Taking? Authorizing Provider  atorvastatin (LIPITOR) 20 MG tablet TAKE 1 TABLET BY MOUTH EVERY DAY 03/11/20   Bedsole, Amy E, MD  cyclobenzaprine (FLEXERIL) 10 MG tablet Take 1 tablet (10 mg total) by mouth at bedtime as needed for muscle spasms. 11/04/20   Bedsole, Amy E, MD  diclofenac (VOLTAREN) 75 MG EC tablet Take 1 tablet (75 mg total) by mouth 2 (two) times daily. 11/18/20   Bedsole, Amy E, MD  glucose blood (ONETOUCH VERIO) test strip Use to test blood sugar once daily 08/23/16   Elayne Snare, MD  JANUMET XR 50-1000 MG TB24 TAKE 1 TABLET BY MOUTH EVERY DAY 04/19/20   Bedsole, Amy E, MD  losartan (COZAAR) 25 MG tablet TAKE 1 TABLET BY MOUTH EVERY DAY 03/11/20   Bedsole, Amy E, MD  mometasone (NASONEX) 50 MCG/ACT nasal spray PLACE 2 SPRAYS INTO THE NOSE DAILY AS NEEDED (CONGESTION). 09/01/20   Bedsole, Amy E, MD  montelukast (SINGULAIR) 10 MG tablet TAKE 1 TABLET BY MOUTH EVERYDAY AT BEDTIME 01/28/20   Bedsole, Amy E, MD  Multiple Vitamin (MULTIVITAMIN WITH MINERALS) TABS tablet Take 1 tablet by mouth daily.    [provider]  omeprazole (PRILOSEC) 20 MG capsule Take 1 capsule (20 mg total) by mouth daily. 09/23/20   Jinny Sanders, MD  ONE TOUCH LANCETS MISC Use to test blood sugar once daily 08/23/16   Elayne Snare, MD    Allergies    Patient has no known allergies.  Review of Systems   Review of Systems  Constitutional: Negative for fever.  HENT: Negative for sore throat.   Eyes: Negative for blurred vision and visual disturbance.  Respiratory: Negative for shortness of breath.   Cardiovascular: Positive for chest pain. Negative for syncope.  Gastrointestinal: Negative for abdominal pain, nausea and vomiting.  Genitourinary: Negative for dysuria.  Musculoskeletal: Negative for neck pain.  Skin: Negative for rash.  Neurological: Positive for dizziness. Negative for weakness.    Physical Exam Updated Vital Signs BP (!) 139/94   Pulse 92   Temp 98.3 F (36.8 C)  (Oral)   Resp 18   Ht 5\' 9"  (1.753 m)   Wt 88.5 kg   SpO2 98%   BMI 28.80 kg/m   Physical Exam Vitals and nursing note reviewed.  Constitutional:      Appearance: Normal appearance. He is well-developed.  HENT:     Head: Normocephalic and atraumatic.  Eyes:     Conjunctiva/sclera: Conjunctivae normal.  Cardiovascular:     Rate and Rhythm: Normal rate and regular rhythm.     Heart sounds: No murmur heard.   Pulmonary:     Effort: Pulmonary effort is normal. No respiratory distress.     Breath sounds: Normal breath sounds.  Abdominal:     Palpations: Abdomen is soft.     Tenderness: There is no abdominal tenderness.  Musculoskeletal:        General: No deformity or signs of injury. Normal  range of motion.     Cervical back: Neck supple.     Right lower leg: No edema.     Left lower leg: No edema.  Skin:    General: Skin is warm and dry.  Neurological:     General: No focal deficit present.     Mental Status: He is alert.     Cranial Nerves: No cranial nerve deficit.     Sensory: No sensory deficit.     Motor: No weakness.     ED Results / Procedures / Treatments   Labs (all labs ordered are listed, but only abnormal results are displayed) Labs Reviewed  BASIC METABOLIC PANEL - Abnormal; Notable for the following components:      Result Value   Glucose, Bld 260 (*)    Creatinine, Ser 0.60 (*)    All other components within normal limits  URINALYSIS, ROUTINE W REFLEX MICROSCOPIC - Abnormal; Notable for the following components:   APPearance HAZY (*)    Glucose, UA 150 (*)    All other components within normal limits  CBG MONITORING, ED - Abnormal; Notable for the following components:   Glucose-Capillary 207 (*)    All other components within normal limits  CBG MONITORING, ED - Abnormal; Notable for the following components:   Glucose-Capillary 120 (*)    All other components within normal limits  CBC  CBG MONITORING, ED    EKG EKG  Interpretation  Date/Time:  Wednesday December 03 2020 14:58:10 EDT Ventricular Rate:  91 PR Interval:  167 QRS Duration: 88 QT Interval:  353 QTC Calculation: 435 R Axis:   18 Text Interpretation: Sinus rhythm Abnormal R-wave progression, early transition No significant change since prior 6/15 Confirmed by Aletta Edouard [phone]) on 12/03/2020 3:03:54 PM   Radiology CT Head Wo Contrast  Result Date: 12/03/2020 CLINICAL DATA:  Headache. EXAM: CT HEAD WITHOUT CONTRAST TECHNIQUE: Contiguous axial images were obtained from the base of the skull through the vertex without intravenous contrast. COMPARISON:  None. FINDINGS: Brain: No evidence of acute infarction, hemorrhage, hydrocephalus, extra-axial collection or mass lesion/mass effect. Vascular: No hyperdense vessel or unexpected calcification. Skull: Normal. Negative for fracture or focal lesion. Sinuses/Orbits: No acute finding. Other: None. IMPRESSION: Normal head CT. Electronically Signed   By: Marijo Conception M.D.   On: 12/03/2020 15:05   DG Chest Port 1 View  Result Date: 12/03/2020 CLINICAL DATA:  Chest pain. Additional history provided: Hyperglycemia, lightheadedness, dizziness. EXAM: PORTABLE CHEST 1 VIEW COMPARISON:  Prior chest radiographs 11/06/2013 and earlier. FINDINGS: Heart size within normal limits. Aortic atherosclerosis. No appreciable airspace consolidation or pulmonary edema. No evidence of pleural effusion or pneumothorax. No acute bony abnormality identified. IMPRESSION: No evidence of acute cardiopulmonary abnormality. Aortic Atherosclerosis (ICD10-I70.0). Electronically Signed   By: Kellie Simmering DO   On: 12/03/2020 14:09    Procedures Procedures   Medications Ordered in ED Medications  sodium chloride 0.9 % bolus 1,000 mL (0 mLs Intravenous Stopped 12/03/20 1636)  ketorolac (TORADOL) 30 MG/ML injection 30 mg (30 mg Intravenous Given 12/03/20 1417)    ED Course  I have reviewed the triage vital signs and the nursing  notes.  Pertinent labs & imaging results that were available during my care of the patient were reviewed by me and considered in my medical decision making (see chart for details).  Clinical Course as of 12/04/20 0836  Wed Dec 03, 2020  1415 Chest x-ray does not show any acute infiltrates. [MB]  Clinical Course User Index [MB] Hayden Rasmussen, MD   MDM Rules/Calculators/A&P                         This patient complains of elevated blood sugars posterior headache x2 months; this involves an extensive number of treatment Options and is a complaint that carries with it a high risk of complications and Morbidity. The differential includes hyperglycemia, noncompliance, metabolic derangement, DKA, tumor, bleed, tension headache  I ordered, reviewed and interpreted labs, which included CBC with normal white count normal hemoglobin, chemistries normal other than elevated glucose, urinalysis unremarkable other than spilling some sugar I ordered medication IV fluids, IV Toradol I ordered imaging studies which included chest x-ray and CT head and I independently    visualized and interpreted imaging which showed no acute findings Previous records obtained and reviewed in epic, no recent admissions  After the interventions stated above, I reevaluated the patient and found patient's blood sugar to be mildly elevated.  No evidence of endorgan damage.  No indications for admission.  Reviewed work-up with patient and he is comfortable plan for outpatient follow-up with his endocrinologist.  Return instructions discussed   Final Clinical Impression(s) / ED Diagnoses Final diagnoses:  Hyperglycemia  Recurrent occipital headache    Rx / DC Orders ED Discharge Orders    None       Hayden Rasmussen, MD 12/04/20 0840

## 2020-12-03 NOTE — Telephone Encounter (Signed)
Patient is calling states that for the past couple of weeks he has checked his blood sugars and blood sugars have been above 200-250 - has experienced some lightheadedness, lethargy, headache - Patient just checked his blood sugar and it is 402. has an appointment scheduled for next Wednesday (6/8). Patient is very anxious and wanting to know what he should do...asking if he should go to ED  Ok to be seen 12/04/20 @ 945

## 2020-12-03 NOTE — Progress Notes (Signed)
Patient ID: Paul Flowers, male   DOB: 1971-01-09, 50 y.o.   MRN: [id number]            Reason for Appointment: Follow-up of diabetes  Referring physician: Diona Browner    History of Present Illness:          Date of diagnosis of type 2 diabetes mellitus: 12/17        Background history:   He had been told to have prediabetes about 2 years ago with highest A1c only 6.5 but no significant hyperglycemia He had not been following any particular diet or exercising and his A1c initially came down to 6.1 However in 12/17 his A1c was up to 6.8 In the third week of January 2018 he started having fatigue, severely increased thirst, frequent urination and was found to have marked hyperglycemia with blood sugar of 620 without Acidosis , had small ketones in the urine  He was switched from metformin to Janumet XR on his initial consultation in 08/2016 because of diarrhea with metformin  Recent history:   Non-insulin hypoglycemic drugs the patient is taking are: Janumet XR 100/1000 daily at dinnertime   His A1c is 7.7 compared to 6.4 in 1/22   Current management, blood sugar patterns and problems identified:   He is still using the Livongo meter and apparently did not check his blood sugars after his visit until recently  She also says that he went off his diet and was not being careful with his meal planning since his sugars were excellent  Still not motivated to do any exercise  His weight has gone up  He came in urgently today because the last 2 or 3 days he has been feeling little lethargic and lightheaded  He does not think he has had any excessive thirst or urination  Also his blood sugars have been as high as 402 after breakfast yesterday  He is checking his blood sugars mostly in the evenings around 7-9 PM but his blood sugars are variable  They appear to be relatively high fasting  For breakfast he will usually eat oatmeal but yesterday he had cereal also  He went to the  emergency room because he was afraid of his blood sugar going up but his blood sugar in the early afternoon was only about 260        Side effects from medications have been: diarrhea from metformin  Glucose monitoring:  done 0.5 times a day         Glucometer:  Livongo  Blood Glucose readings  RANGE 124-102 recently  AVERAGE 191 before meals and 186 after meals OVERALL average 196 for the last 30 days   Self-care: The diet that the patient has been following is: tries to limit  Carbohydrates, sweets and drinks with sugar.     Meal times are:  Breakfast is at  7 AM Lunch: at work during [employer]: 8 pm                Dietician visit, most recent: 10/2016  Weight history:  Wt Readings from Last 3 Encounters:  12/04/20 193 lb 6.4 oz (87.7 kg)  12/03/20 195 lb (88.5 kg)  11/18/20 195 lb (88.5 kg)    Glycemic control:   Lab Results  Component Value Date   HGBA1C 6.4 08/04/2020   HGBA1C 6.2 (A) 08/01/2020   HGBA1C 6.3 01/29/2020   Lab Results  Component Value Date   MICROALBUR <0.7 01/29/2020   [place] 65 01/29/2020  CREATININE 0.60 (L) 12/03/2020   Lab Results  Component Value Date   MICRALBCREAT 2.5 01/29/2020    Lab Results  Component Value Date   FRUCTOSAMINE 277 09/15/2016     Allergies as of 12/04/2020   No Known Allergies     Medication List       Accurate as of December 04, 2020 10:04 AM. If you have any questions, ask your nurse or doctor.        atorvastatin 20 MG tablet Commonly known as: LIPITOR TAKE 1 TABLET BY MOUTH EVERY DAY   cyclobenzaprine 10 MG tablet Commonly known as: FLEXERIL Take 1 tablet (10 mg total) by mouth at bedtime as needed for muscle spasms.   diclofenac 75 MG EC tablet Commonly known as: VOLTAREN Take 1 tablet (75 mg total) by mouth 2 (two) times daily.   glucose blood test strip Commonly known as: OneTouch Verio Use to test blood sugar once daily   Janumet XR 50-1000 MG Tb24 Generic drug:  SitaGLIPtin-MetFORMIN HCl TAKE 1 TABLET BY MOUTH EVERY DAY   losartan 25 MG tablet Commonly known as: COZAAR TAKE 1 TABLET BY MOUTH EVERY DAY   mometasone 50 MCG/ACT nasal spray Commonly known as: NASONEX PLACE 2 SPRAYS INTO THE NOSE DAILY AS NEEDED (CONGESTION).   montelukast 10 MG tablet Commonly known as: SINGULAIR TAKE 1 TABLET BY MOUTH EVERYDAY AT BEDTIME   multivitamin with minerals Tabs tablet Take 1 tablet by mouth daily.   omeprazole 20 MG capsule Commonly known as: PRILOSEC Take 1 capsule (20 mg total) by mouth daily.   ONE TOUCH LANCETS Misc Use to test blood sugar once daily       Allergies: No Known Allergies  Past Medical History:  Diagnosis Date  . Allergic rhinitis   . ALLERGIC RHINITIS 01/19/2007  . Diabetes ([place])    diet controlled  . GERD 01/19/2007  . GERD (gastroesophageal reflux disease)   . Hypertension    Slight  . Renal disorder   . Sleep apnea 01/29/2011    Past Surgical History:  Procedure Laterality Date  . COLONOSCOPY WITH PROPOFOL N/A 02/25/2020   Procedure: COLONOSCOPY WITH PROPOFOL;  Surgeon: Jonathon Bellows, MD;  Location: [facility] ENDOSCOPY;  Service: Gastroenterology;  Laterality: N/A;  . CORONARY ANGIOPLASTY     no blockage  . CYSTOSCOPY/URETEROSCOPY/HOLMIUM LASER/STENT PLACEMENT Right 02/02/2018   Procedure: CYSTOSCOPY/URETEROSCOPY//STENT PLACEMENT;  Surgeon: Cleon Gustin, MD;  Location: [facility] ORS;  Service: Urology;  Laterality: Right;  . LEFT HEART CATHETERIZATION WITH CORONARY ANGIOGRAM N/A 12/11/2013   Procedure: LEFT HEART CATHETERIZATION WITH CORONARY ANGIOGRAM;  Surgeon: Josue Hector, MD;  Location: [facility] CATH LAB;  Service: Cardiovascular;  Laterality: N/A;    Family History  Problem Relation Age of Onset  . Hypertension Mother   . Heart disease Father 38  . Hyperlipidemia Father   . Colonic polyp Father   . Diabetes Father   . Diabetes Maternal Grandmother   . Heart disease Maternal Grandmother   . Stroke Maternal  Grandfather   . Colon cancer Neg Hx     Social History:  reports that he has never smoked. He has never used smokeless tobacco. He reports current alcohol use. He reports that he does not use drugs.   Review of Systems   Lipid history: He has been continuing Lipitor 20 mg daily for baseline LDL of 141, followed by PCP  Last results are as follows:   Lab Results  Component Value Date   CHOL 136 01/29/2020  HDL 59.70 01/29/2020   LDLCALC 65 01/29/2020   LDLDIRECT 149.3 02/12/2013   TRIG 56.0 01/29/2020   CHOLHDL 2 01/29/2020           Hypertension:  treated with  25 mg losartan, prescribed by PCP   BP Readings from Last 3 Encounters:  12/04/20 (!) 142/80  12/03/20 (!) 139/100  11/18/20 122/78     Most recent eye exam was In 3/21, negative  Last foot exam 2/22  Physical Examination:  BP (!) 142/80 (BP Location: Left Arm, Patient Position: Sitting, Cuff Size: Normal)   Pulse 90   Ht 5\' 9"  (1.753 m)   Wt 193 lb 6.4 oz (87.7 kg)   SpO2 99%   BMI 28.56 kg/m       ASSESSMENT:  Diabetes type 2, Nonobese  See history of present illness for detailed discussion of current diabetes management, blood sugar patterns and problems identified   His A1c is 7.7, previously was at 6.4 and usually consistent  He has been on Janumet XR 100/1000 1 tablet daily as only treatment for several years However his blood sugars are consistently out of control and not clear if this is from progression of his diabetes or recently poor diet Not clear if he is symptomatic since his fatigue is only present for the last 2 days and he does not have polydipsia He has been checking his blood sugars only since about 8 days ago and not clear when his blood sugar went up  Mild hypertension: Well-controlled  PLAN:    He will continue to use the Livongo meter because of cost and check readings alternating fasting and 2 hours after meals  Discussed blood sugar targets fasting and after  meals  Stop JANUMET and start Xigduo 11/998 daily Discussed action of SGLT 2 drugs on lowering glucose by decreasing kidney absorption of glucose, benefits of weight loss and lower blood pressure, long-term cardiac and renal benefits possible side effects   He will increase his fluid intake with this  Also start RYBELSUS to help with postprandial readings along with cardiovascular benefits Discussed with the patient the nature of GLP-1 drugs, the actions on insulin secretion, slowing stomach emptying, reduction of appetite and reduced liver glucose production Explained that Rybelsus improves blood sugar control as well as produces weight loss and reduces cardiovascular events. Explained possible side effects especially nausea and vomiting that may occur in the first few days; usually side effects improve with time.  Patient to call if nausea or vomiting does not improve within 2 weeks Instructed to take the capsules on empty stomach 30 minutes before breakfast with 4 ounces of water daily.    Patient education material given and 30-day samples given of the 3 mg dose  Will consider increasing it to 7 mg on his follow-up in 1 month   Encouraged him to start a walking program for exercise  Total visit time including counseling = 30 minutes     There are no Patient Instructions on file for this visit.    Elayne Snare 12/04/2020, 10:04 AM   Note: This office note was prepared with Dragon voice recognition system technology. Any transcriptional errors that result from this process are unintentional.

## 2020-12-03 NOTE — Discharge Instructions (Signed)
You were seen in the emergency department for elevated blood sugars.  You had lab work EKG chest x-ray and a CAT scan of your head that did not show any significant abnormalities other than mild elevation of your sugar.  Please follow-up with your endocrinologist as scheduled tomorrow.  Continue your regular medications and record your blood sugars.  Return to the emergency department if any worsening or concerning symptoms

## 2020-12-04 ENCOUNTER — Encounter: Payer: Self-pay | Admitting: Endocrinology

## 2020-12-04 ENCOUNTER — Other Ambulatory Visit: Payer: Self-pay

## 2020-12-04 ENCOUNTER — Other Ambulatory Visit: Payer: Federal, State, Local not specified - PPO

## 2020-12-04 ENCOUNTER — Ambulatory Visit: Payer: Federal, State, Local not specified - PPO | Admitting: Endocrinology

## 2020-12-04 VITALS — BP 142/80 | HR 90 | Ht 69.0 in | Wt 193.4 lb

## 2020-12-04 DIAGNOSIS — E1165 Type 2 diabetes mellitus with hyperglycemia: Secondary | ICD-10-CM | POA: Diagnosis not present

## 2020-12-04 DIAGNOSIS — E119 Type 2 diabetes mellitus without complications: Secondary | ICD-10-CM | POA: Diagnosis not present

## 2020-12-04 DIAGNOSIS — E782 Mixed hyperlipidemia: Secondary | ICD-10-CM | POA: Diagnosis not present

## 2020-12-04 LAB — POCT GLYCOSYLATED HEMOGLOBIN (HGB A1C): Hemoglobin A1C: 7.7 % — AB (ref 4.0–5.6)

## 2020-12-04 LAB — POCT GLUCOSE (DEVICE FOR HOME USE): POC Glucose: 153 mg/dl — AB (ref 70–99)

## 2020-12-04 MED ORDER — XIGDUO XR 5-1000 MG PO TB24: 2.0000 | ORAL_TABLET | Freq: Every day | ORAL | 2 refills | Status: DC

## 2020-12-04 NOTE — Telephone Encounter (Signed)
Agreed.. should call Dr. Dwyane Dee.  He is on max of his current medicaiton. If not response from Dr. Dwyane Dee.. let me know and I can consdier additional meds. Also have pt increase water intake and lower carb intake.

## 2020-12-04 NOTE — Telephone Encounter (Signed)
Called and spoke with patient who stated that he went to the ED yesterday and they stated that everything was fine and that he should follow-up with Dr. Dwyane Dee. Patient saw Dr. Dwyane Dee this morning who changed his medications and wants him to follow-up in four weeks. Patient states that he is feeling much better today. Instructed patient to call if he has any further questions or concerns. Patient verbalized understanding.

## 2020-12-04 NOTE — Patient Instructions (Signed)
Rybelsus improves blood sugar control as well as can help with weight loss and reduces cardiovascular events. Need to take the capsules on empty stomach 30 minutes before breakfast with 4 ounces of water daily.  You may feel more fullness at mealtimes and try to keep portions at meals smaller Some people may have nausea or even vomiting that may occur in the first few days; usually the symptoms go away with time. Please call if nausea or vomiting does not improve within 2 weeks  Walk daily  Check blood sugars on waking up 2-3  days a week  Also check blood sugars about 2 hours after meals and do this after different meals by rotation  Recommended blood sugar levels on waking up are 90-130 and about 2 hours after meal is 130-160  Please bring your blood sugar monitor to each visit, thank you

## 2020-12-08 ENCOUNTER — Telehealth: Payer: Self-pay | Admitting: Endocrinology

## 2020-12-08 NOTE — Telephone Encounter (Signed)
Please advise 

## 2020-12-08 NOTE — Telephone Encounter (Signed)
I have already sent the Mychart message regarding this to Dr Dwyane Dee

## 2020-12-08 NOTE — Telephone Encounter (Signed)
Patient requests to be called at ph# [phone] re: Patient was prescribed 2 new medications: Rybelsus and Xigduo and states they are not working. Patient request to be advised.

## 2020-12-10 ENCOUNTER — Ambulatory Visit: Payer: Federal, State, Local not specified - PPO | Admitting: Endocrinology

## 2020-12-19 ENCOUNTER — Emergency Department (HOSPITAL_BASED_OUTPATIENT_CLINIC_OR_DEPARTMENT_OTHER)
Admission: EM | Admit: 2020-12-19 | Discharge: 2020-12-19 | Disposition: A | Discharge status: Home / Self Care | Payer: Federal, State, Local not specified - PPO | Attending: Emergency Medicine | Admitting: Emergency Medicine

## 2020-12-19 ENCOUNTER — Other Ambulatory Visit: Payer: Self-pay

## 2020-12-19 ENCOUNTER — Encounter (HOSPITAL_BASED_OUTPATIENT_CLINIC_OR_DEPARTMENT_OTHER): Payer: Self-pay | Admitting: *Deleted

## 2020-12-19 DIAGNOSIS — M79661 Pain in right lower leg: Secondary | ICD-10-CM | POA: Insufficient documentation

## 2020-12-19 DIAGNOSIS — E1169 Type 2 diabetes mellitus with other specified complication: Secondary | ICD-10-CM | POA: Insufficient documentation

## 2020-12-19 DIAGNOSIS — Z79899 Other long term (current) drug therapy: Secondary | ICD-10-CM | POA: Insufficient documentation

## 2020-12-19 DIAGNOSIS — S50819A Abrasion of unspecified forearm, initial encounter: Secondary | ICD-10-CM

## 2020-12-19 DIAGNOSIS — S59911A Unspecified injury of right forearm, initial encounter: Secondary | ICD-10-CM | POA: Diagnosis not present

## 2020-12-19 DIAGNOSIS — S50811A Abrasion of right forearm, initial encounter: Secondary | ICD-10-CM | POA: Diagnosis not present

## 2020-12-19 DIAGNOSIS — I10 Essential (primary) hypertension: Secondary | ICD-10-CM | POA: Diagnosis not present

## 2020-12-19 DIAGNOSIS — M791 Myalgia, unspecified site: Secondary | ICD-10-CM | POA: Diagnosis not present

## 2020-12-19 DIAGNOSIS — Y92410 Unspecified street and highway as the place of occurrence of the external cause: Secondary | ICD-10-CM | POA: Diagnosis not present

## 2020-12-19 DIAGNOSIS — E785 Hyperlipidemia, unspecified: Secondary | ICD-10-CM | POA: Diagnosis not present

## 2020-12-19 DIAGNOSIS — Z7984 Long term (current) use of oral hypoglycemic drugs: Secondary | ICD-10-CM | POA: Insufficient documentation

## 2020-12-19 DIAGNOSIS — S50812A Abrasion of left forearm, initial encounter: Secondary | ICD-10-CM | POA: Insufficient documentation

## 2020-12-19 DIAGNOSIS — M545 Low back pain, unspecified: Secondary | ICD-10-CM | POA: Diagnosis not present

## 2020-12-19 DIAGNOSIS — M7918 Myalgia, other site: Secondary | ICD-10-CM

## 2020-12-19 MED ORDER — CYCLOBENZAPRINE HCL 10 MG PO TABS: 10.0000 mg | ORAL_TABLET | Freq: Every evening | ORAL | 0 refills | Status: DC | PRN

## 2020-12-19 MED ORDER — LIDOCAINE 5 % EX PTCH: 1.0000 | MEDICATED_PATCH | CUTANEOUS | 0 refills | Status: DC

## 2020-12-19 MED ORDER — MELOXICAM 7.5 MG PO TABS: 7.5000 mg | ORAL_TABLET | Freq: Every day | ORAL | 0 refills | Status: DC

## 2020-12-19 MED ORDER — BACITRACIN ZINC 500 UNIT/GM EX OINT: 1.0000 "application " | TOPICAL_OINTMENT | Freq: Two times a day (BID) | CUTANEOUS | 0 refills | Status: DC
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
## 2020-12-19 NOTE — Discharge Instructions (Signed)
Apply bacitracin to abrasions on your forearms. Take Tylenol as needed as directed for pain.  He can also apply Lidoderm patches and take Flexeril as needed as prescribed. Take meloxicam for pain not controlled with above medications. Recheck with your primary care provider for pain lasting longer than 3 to 5 days.  Return to the ED for severe concerning symptoms.
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
## 2020-12-23 ENCOUNTER — Other Ambulatory Visit: Payer: Self-pay

## 2020-12-23 ENCOUNTER — Encounter: Payer: Self-pay | Admitting: Family Medicine

## 2020-12-23 ENCOUNTER — Ambulatory Visit: Payer: Federal, State, Local not specified - PPO | Admitting: Family Medicine

## 2020-12-23 DIAGNOSIS — T07XXXA Unspecified multiple injuries, initial encounter: Secondary | ICD-10-CM | POA: Insufficient documentation

## 2020-12-23 DIAGNOSIS — M542 Cervicalgia: Secondary | ICD-10-CM | POA: Insufficient documentation

## 2020-12-23 NOTE — Patient Instructions (Signed)
Watch out for worse neck pain or stiffness or headache  Keep the abrasions clean with soap and water  Use the bacitracin ointment until healed   Heat on neck/shoulder area is helpful for tight muscles - 10 minutes at a time
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
## 2020-12-26 ENCOUNTER — Other Ambulatory Visit (INDEPENDENT_AMBULATORY_CARE_PROVIDER_SITE_OTHER): Payer: Federal, State, Local not specified - PPO

## 2020-12-26 ENCOUNTER — Other Ambulatory Visit: Payer: Self-pay

## 2020-12-26 DIAGNOSIS — E119 Type 2 diabetes mellitus without complications: Secondary | ICD-10-CM

## 2020-12-26 DIAGNOSIS — E782 Mixed hyperlipidemia: Secondary | ICD-10-CM

## 2020-12-26 DIAGNOSIS — E1165 Type 2 diabetes mellitus with hyperglycemia: Secondary | ICD-10-CM

## 2020-12-26 LAB — MICROALBUMIN / CREATININE URINE RATIO
Creatinine,U: 150.9 mg/dL
Microalb Creat Ratio: 0.6 mg/g (ref 0.0–30.0)
Microalb, Ur: 0.9 mg/dL (ref 0.0–1.9)

## 2020-12-26 LAB — COMPREHENSIVE METABOLIC PANEL
ALT: 37 U/L (ref 0–53)
AST: 27 U/L (ref 0–37)
Albumin: 4.2 g/dL (ref 3.5–5.2)
Alkaline Phosphatase: 52 U/L (ref 39–117)
BUN: 11 mg/dL (ref 6–23)
CO2: 27 mEq/L (ref 19–32)
Calcium: 9.4 mg/dL (ref 8.4–10.5)
Chloride: 103 mEq/L (ref 96–112)
Creatinine, Ser: 0.78 mg/dL (ref 0.40–1.50)
GFR: 103 mL/min (ref >60.00)
Glucose, Bld: 94 mg/dL (ref 70–99)
Potassium: 3.9 mEq/L (ref 3.5–5.1)
Sodium: 140 mEq/L (ref 135–145)
Total Bilirubin: 1.3 mg/dL — ABNORMAL HIGH (ref 0.2–1.2)
Total Protein: 7.2 g/dL (ref 6.0–8.3)

## 2020-12-26 LAB — LIPID PANEL
Cholesterol: 103 mg/dL (ref 0–200)
HDL: 43.8 mg/dL (ref >39.00)
LDL Cholesterol: 48 mg/dL (ref 0–99)
NonHDL: 59.23
Total CHOL/HDL Ratio: 2
Triglycerides: 57 mg/dL (ref 0.0–149.0)
VLDL: 11.4 mg/dL (ref 0.0–40.0)

## 2020-12-28 DIAGNOSIS — G4733 Obstructive sleep apnea (adult) (pediatric): Secondary | ICD-10-CM | POA: Diagnosis not present

## 2020-12-31 ENCOUNTER — Other Ambulatory Visit: Payer: Self-pay

## 2020-12-31 ENCOUNTER — Encounter: Payer: Self-pay | Admitting: Endocrinology

## 2020-12-31 ENCOUNTER — Ambulatory Visit: Payer: Federal, State, Local not specified - PPO | Admitting: Endocrinology

## 2020-12-31 VITALS — BP 132/80 | HR 102 | Ht 69.0 in | Wt 187.2 lb

## 2020-12-31 DIAGNOSIS — E1165 Type 2 diabetes mellitus with hyperglycemia: Secondary | ICD-10-CM | POA: Diagnosis not present

## 2020-12-31 DIAGNOSIS — I10 Essential (primary) hypertension: Secondary | ICD-10-CM

## 2020-12-31 DIAGNOSIS — E782 Mixed hyperlipidemia: Secondary | ICD-10-CM

## 2020-12-31 NOTE — Progress Notes (Signed)
Patient ID: Paul Flowers, male   DOB: 1971-02-07, 50 y.o.   MRN: [id number]            Reason for Appointment: Follow-up of diabetes  Referring physician: Diona Browner    History of Present Illness:          Date of diagnosis of type 2 diabetes mellitus: 12/17        Background history:   He had been told to have prediabetes about 2 years ago with highest A1c only 6.5 but no significant hyperglycemia He had not been following any particular diet or exercising and his A1c initially came down to 6.1 However in 12/17 his A1c was up to 6.8 In the third week of January 2018 he started having fatigue, severely increased thirst, frequent urination and was found to have marked hyperglycemia with blood sugar of 620 without Acidosis , had small ketones in the urine  He was switched from metformin to Janumet XR on his initial consultation in 08/2016 because of diarrhea with metformin  Recent history:   Non-insulin hypoglycemic drugs the patient is taking are: Xigduo XR 11/998 daily    His A1c recently was 7.7 compared to 6.4 in 1/22   Current management, blood sugar patterns and problems identified:  He is now on Xigduo, previously had significantly high readings with Janumet with mild symptoms of hyperglycemia Still using the Liv[place] meter Most of his monitoring is before dinner and only sporadically after meals He was advised to start Rybelsus and Xigduo about since he had significant hypoglycemia However he says he had nausea and stopped the Rybelsus after 2 days Although he was supposed to take 2 tablets of Xigduo 11/998 he thinks it was causing nausea and is taking only 1 tablet for at least the last week His weight has gone up slightly, previously had lost weight compared to February Lab fasting glucose was 99 For breakfast he will eat toast and scrambled eggs as well as some bacon Otherwise he is trying to eat healthy meals, cut back on carbohydrates and sweets Except for some  housework he has not done any formal exercise        Side effects from medications have been: diarrhea from metformin  Glucose monitoring:  done 0.5 times a day         Glucometer:  [place]  Blood Glucose readings from meter for the last 30 days   PRE-MEAL Fasting Lunch Dinner Bedtime Overall  Glucose range: ? 83, 166 93-133    Mean/median:     130   POST-MEAL PC Breakfast PC Lunch PC Dinner  Glucose range: 96, 119    Mean/median:       PREVIOUSLY  AVERAGE 191 before meals and 186 after meals OVERALL average 196 for the last 30 days   Self-care: The diet that the patient has been following is: tries to limit  Carbohydrates, sweets and drinks with sugar.     Meal times are:  Breakfast is at  7 AM Lunch: at work during [employer]: 8 pm                Dietician visit, most recent: 10/2016  Weight history:  Wt Readings from Last 3 Encounters:  12/31/20 187 lb 3.2 oz (84.9 kg)  12/23/20 188 lb (85.3 kg)  12/19/20 184 lb (83.5 kg)    Glycemic control:   Lab Results  Component Value Date   HGBA1C 7.7 (A) 12/04/2020   HGBA1C 6.4 08/04/2020  HGBA1C 6.2 (A) 08/01/2020   Lab Results  Component Value Date   MICROALBUR 0.9 12/26/2020   LDLCALC 48 12/26/2020   CREATININE 0.78 12/26/2020   Lab Results  Component Value Date   MICRALBCREAT 0.6 12/26/2020    Lab Results  Component Value Date   FRUCTOSAMINE 277 09/15/2016     Allergies as of 12/31/2020   No Known Allergies      Medication List        Accurate as of December 31, 2020  8:32 AM. If you have any questions, ask your nurse or doctor.          atorvastatin 20 MG tablet Commonly known as: LIPITOR TAKE 1 TABLET BY MOUTH EVERY DAY   bacitracin ointment Apply 1 application topically 2 (two) times daily.   cyclobenzaprine 10 MG tablet Commonly known as: FLEXERIL Take 1 tablet (10 mg total) by mouth at bedtime as needed for muscle spasms.   glucose blood test strip Commonly known as: OneTouch  Verio Use to test blood sugar once daily   lidocaine 5 % Commonly known as: Lidoderm Place 1 patch onto the skin daily. Remove & Discard patch within 12 hours or as directed by MD   losartan 25 MG tablet Commonly known as: COZAAR TAKE 1 TABLET BY MOUTH EVERY DAY   meloxicam 7.5 MG tablet Commonly known as: Mobic Take 1 tablet (7.5 mg total) by mouth daily.   mometasone 50 MCG/ACT nasal spray Commonly known as: NASONEX PLACE 2 SPRAYS INTO THE NOSE DAILY AS NEEDED (CONGESTION).   montelukast 10 MG tablet Commonly known as: SINGULAIR TAKE 1 TABLET BY MOUTH EVERYDAY AT BEDTIME   multivitamin with minerals Tabs tablet Take 1 tablet by mouth daily.   omeprazole 20 MG capsule Commonly known as: PRILOSEC Take 1 capsule (20 mg total) by mouth daily.   ONE TOUCH LANCETS Misc Use to test blood sugar once daily   Xigduo XR 11-998 MG Tb24 Generic drug: Dapagliflozin-metFORMIN HCl ER Take 2 tablets by mouth daily.        Allergies: No Known Allergies  Past Medical History:  Diagnosis Date   Allergic rhinitis    ALLERGIC RHINITIS 01/19/2007   Diabetes ([place])    diet controlled   GERD 01/19/2007   GERD (gastroesophageal reflux disease)    Hypertension    Slight   Renal disorder    Sleep apnea 01/29/2011    Past Surgical History:  Procedure Laterality Date   COLONOSCOPY WITH PROPOFOL N/A 02/25/2020   Procedure: COLONOSCOPY WITH PROPOFOL;  Surgeon: Jonathon Bellows, MD;  Location: [facility] ENDOSCOPY;  Service: Gastroenterology;  Laterality: N/A;   CORONARY ANGIOPLASTY     no blockage   CYSTOSCOPY/URETEROSCOPY/HOLMIUM LASER/STENT PLACEMENT Right 02/02/2018   Procedure: CYSTOSCOPY/URETEROSCOPY//STENT PLACEMENT;  Surgeon: Cleon Gustin, MD;  Location: [facility] ORS;  Service: Urology;  Laterality: Right;   LEFT HEART CATHETERIZATION WITH CORONARY ANGIOGRAM N/A 12/11/2013   Procedure: LEFT HEART CATHETERIZATION WITH CORONARY ANGIOGRAM;  Surgeon: Josue Hector, MD;  Location: [facility] CATH LAB;   Service: Cardiovascular;  Laterality: N/A;    Family History  Problem Relation Age of Onset   Hypertension Mother    Heart disease Father 14   Hyperlipidemia Father    Colonic polyp Father    Diabetes Father    Diabetes Maternal Grandmother    Heart disease Maternal Grandmother    Stroke Maternal Grandfather    Colon cancer Neg Hx     Social History:  reports that he has never smoked.  He has never used smokeless tobacco. He reports current alcohol use. He reports that he does not use drugs.   Review of Systems   Lipid history: He has been continuing Lipitor 20 mg daily for baseline LDL of 141, followed by PCP Recently has been doing better with his diet  Last results are as follows:   Lab Results  Component Value Date   CHOL 103 12/26/2020   HDL 43.80 12/26/2020   LDLCALC 48 12/26/2020   LDLDIRECT 149.3 02/12/2013   TRIG 57.0 12/26/2020   CHOLHDL 2 12/26/2020           Hypertension:  treated with  25 mg losartan, prescribed by PCP   BP Readings from Last 3 Encounters:  12/31/20 132/80  12/23/20 138/74  12/19/20 138/89     Most recent eye exam was In 3/21, negative  Last foot exam 2/22  Physical Examination:  BP 132/80   Pulse (!) 102   Ht 5\' 9"  (1.753 m)   Wt 187 lb 3.2 oz (84.9 kg)   SpO2 98%   BMI 27.64 kg/m       ASSESSMENT:  Diabetes type 2, Nonobese  See history of present illness for detailed discussion of current diabetes management, blood sugar patterns and problems identified   His A1c is last 7.7, previously was at 6.4  He likely had some progression of his diabetes since he was starting to have significant hyperglycemia Also had not been consistent with diet Only on 11/998 Xigduo and apparently could not tolerate Rybelsus 3 mg sample  Surprisingly his blood sugars are excellent now and near normal although not checked enough after meals Also can do better with a regular exercise program  Mild hypertension: Well-controlled with  only 25 mg losartan Urine microalbumin consistently normal  lipids: Excellent control and likely better now with improved diet, will defer any adjustment of Lipitor to PCP  PLAN:   He will continue to use the Xigduo 1 tablet daily as blood sugars are fairly well controlled However needs to try and check more readings after meals and occasionally fasting Continue modifying diet with lower calorie content and carbohydrate along with some protein at each meal He will call if he has any unusually high readings, may need to increase Xigduo at that point  Encouraged him to start a walking program for exercise       There are no Patient Instructions on file for this visit.    Elayne Snare 12/31/2020, 8:32 AM   Note: This office note was prepared with Dragon voice recognition system technology. Any transcriptional errors that result from this process are unintentional.

## 2020-12-31 NOTE — Patient Instructions (Addendum)
Check blood sugars on waking up days a week  Also check blood sugars about 2 hours after meals and do this after different meals by rotation  Recommended blood sugar levels on waking up are 90-120 and about 2 hours after meal is 130-160  Please bring your blood sugar monitor to each visit, thank you  

## 2021-01-01 ENCOUNTER — Other Ambulatory Visit: Payer: Self-pay | Admitting: Endocrinology

## 2021-01-15 ENCOUNTER — Other Ambulatory Visit: Payer: Self-pay | Admitting: Family Medicine

## 2021-01-15 NOTE — Telephone Encounter (Signed)
Please schedule CPE with fasting labs prior for Dr. Bedsole. 

## 2021-01-28 NOTE — Telephone Encounter (Signed)
Called pt to schedule appt. Pt did not answer and VM Is full

## 2021-01-30 ENCOUNTER — Other Ambulatory Visit: Payer: Federal, State, Local not specified - PPO

## 2021-02-02 ENCOUNTER — Ambulatory Visit: Payer: Federal, State, Local not specified - PPO | Admitting: Endocrinology

## 2021-02-27 ENCOUNTER — Other Ambulatory Visit (INDEPENDENT_AMBULATORY_CARE_PROVIDER_SITE_OTHER): Payer: Federal, State, Local not specified - PPO

## 2021-02-27 DIAGNOSIS — E1165 Type 2 diabetes mellitus with hyperglycemia: Secondary | ICD-10-CM

## 2021-02-27 LAB — BASIC METABOLIC PANEL
BUN: 11 mg/dL (ref 6–23)
CO2: 26 mEq/L (ref 19–32)
Calcium: 9.5 mg/dL (ref 8.4–10.5)
Chloride: 103 mEq/L (ref 96–112)
Creatinine, Ser: 0.75 mg/dL (ref 0.40–1.50)
GFR: 104.1 mL/min (ref >60.00)
Glucose, Bld: 111 mg/dL — ABNORMAL HIGH (ref 70–99)
Potassium: 3.6 mEq/L (ref 3.5–5.1)
Sodium: 138 mEq/L (ref 135–145)

## 2021-02-27 LAB — HEMOGLOBIN A1C: Hgb A1c MFr Bld: 6 % (ref 4.6–6.5)

## 2021-03-02 DIAGNOSIS — G4733 Obstructive sleep apnea (adult) (pediatric): Secondary | ICD-10-CM | POA: Diagnosis not present

## 2021-03-04 ENCOUNTER — Ambulatory Visit: Payer: Federal, State, Local not specified - PPO | Admitting: Endocrinology

## 2021-03-06 ENCOUNTER — Ambulatory Visit: Payer: Federal, State, Local not specified - PPO | Admitting: Endocrinology

## 2021-03-06 ENCOUNTER — Other Ambulatory Visit: Payer: Self-pay

## 2021-03-06 ENCOUNTER — Encounter: Payer: Self-pay | Admitting: Endocrinology

## 2021-03-06 VITALS — BP 140/84 | HR 84 | Ht 70.0 in | Wt 184.2 lb

## 2021-03-06 DIAGNOSIS — E119 Type 2 diabetes mellitus without complications: Secondary | ICD-10-CM | POA: Diagnosis not present

## 2021-03-06 NOTE — Patient Instructions (Signed)
Check blood sugars on waking up 3 days a week  Also check blood sugars about 2 hours after meals and do this after different meals by rotation  Recommended blood sugar levels on waking up are 80-120 and about 2 hours after meal is 130-160  Please bring your blood sugar monitor to each visit, thank you

## 2021-03-06 NOTE — Progress Notes (Signed)
Patient ID: Paul Flowers, male   DOB: [birth year], 50 y.o.   MRN: [id number]            Reason for Appointment: Follow-up of diabetes  Referring physician: Diona Browner    History of Present Illness:          Date of diagnosis of type 2 diabetes mellitus: 12/17        Background history:   He had been told to have prediabetes about 2 years ago with highest A1c only 6.5 but no significant hyperglycemia He had not been following any particular diet or exercising and his A1c initially came down to 6.1 However in 12/17 his A1c was up to 6.8 In the third week of January 2018 he started having fatigue, severely increased thirst, frequent urination and was found to have marked hyperglycemia with blood sugar of 620 without Acidosis , had small ketones in the urine  He was switched from metformin to Janumet XR on his initial consultation in 08/2016 because of diarrhea with metformin  Recent history:   Non-insulin hypoglycemic drugs the patient is taking are: Xigduo XR 11/998 daily    His A1c recently was 6, previously 7.7 compared to 6.4 in 1/22   Current management, blood sugar patterns and problems identified:  He is continuing on 1 tablet of Xigduo only  With this his blood sugars are excellent and also well controlled after meals  He thinks that he is doing very strict diet with decreased carbohydrate intake overall, no sweets Only occasionally will have some whole-grain bread or brown rice Although he has not found the time for exercise most of the time he is losing a little weight He tends to however have mostly slightly high fasting readings above 100 and not clear why it was 157 today, lab glucose 111 fasting No side effects from Xigduo For breakfast he will eat toast and scrambled eggs as well as some bacon        Side effects from medications have been: diarrhea from metformin  Glucose monitoring:  done 0.5 times a day         Glucometer:  [place]  Blood Glucose readings  from meter for the last 30 days   PRE-MEAL Fasting Lunch Dinner Bedtime Overall  Glucose range: 93-157   101-121 78-166  Mean/median:     111   Previously:  PRE-MEAL Fasting Lunch Dinner Bedtime Overall  Glucose range: ? 83, 166 93-133    Mean/median:     130   POST-MEAL PC Breakfast PC Lunch PC Dinner  Glucose range: 96, 119    Mean/median:        Self-care: The diet that the patient has been following is: tries to limit  Carbohydrates, sweets and drinks with sugar.     Meal times are:  Breakfast is at  7 AM Lunch: at work during [employer]: 8 pm                Dietician visit, most recent: 10/2016  Weight history:  Wt Readings from Last 3 Encounters:  03/06/21 184 lb 3.2 oz (83.6 kg)  12/31/20 187 lb 3.2 oz (84.9 kg)  12/23/20 188 lb (85.3 kg)    Glycemic control:   Lab Results  Component Value Date   HGBA1C 6.0 02/27/2021   HGBA1C 7.7 (A) 12/04/2020   HGBA1C 6.4 08/04/2020   Lab Results  Component Value Date   MICROALBUR 0.9 12/26/2020   LDLCALC 48 12/26/2020   CREATININE 0.75  02/27/2021   Lab Results  Component Value Date   MICRALBCREAT 0.6 12/26/2020    Lab Results  Component Value Date   FRUCTOSAMINE 277 09/15/2016     Allergies as of 03/06/2021   No Known Allergies      Medication List        Accurate as of March 06, 2021  9:47 AM. If you have any questions, ask your nurse or doctor.          atorvastatin 20 MG tablet Commonly known as: LIPITOR TAKE 1 TABLET BY MOUTH EVERY DAY   bacitracin ointment Apply 1 application topically 2 (two) times daily.   cyclobenzaprine 10 MG tablet Commonly known as: FLEXERIL Take 1 tablet (10 mg total) by mouth at bedtime as needed for muscle spasms.   glucose blood test strip Commonly known as: OneTouch Verio Use to test blood sugar once daily   lidocaine 5 % Commonly known as: Lidoderm Place 1 patch onto the skin daily. Remove & Discard patch within 12 hours or as directed by MD    losartan 25 MG tablet Commonly known as: COZAAR TAKE 1 TABLET BY MOUTH EVERY DAY   mometasone 50 MCG/ACT nasal spray Commonly known as: NASONEX PLACE 2 SPRAYS INTO THE NOSE DAILY AS NEEDED (CONGESTION).   montelukast 10 MG tablet Commonly known as: SINGULAIR TAKE 1 TABLET BY MOUTH EVERYDAY AT BEDTIME   multivitamin with minerals Tabs tablet Take 1 tablet by mouth daily.   omeprazole 20 MG capsule Commonly known as: PRILOSEC Take 1 capsule (20 mg total) by mouth daily.   ONE TOUCH LANCETS Misc Use to test blood sugar once daily   Xigduo XR 11-998 MG Tb24 Generic drug: Dapagliflozin-metFORMIN HCl ER Take 1 tablet by mouth daily.        Allergies: No Known Allergies  Past Medical History:  Diagnosis Date   Allergic rhinitis    ALLERGIC RHINITIS 01/19/2007   Diabetes ([place])    diet controlled   GERD 01/19/2007   GERD (gastroesophageal reflux disease)    Hypertension    Slight   Renal disorder    Sleep apnea 01/29/2011    Past Surgical History:  Procedure Laterality Date   COLONOSCOPY WITH PROPOFOL N/A 02/25/2020   Procedure: COLONOSCOPY WITH PROPOFOL;  Surgeon: Jonathon Bellows, MD;  Location: [facility] ENDOSCOPY;  Service: Gastroenterology;  Laterality: N/A;   CORONARY ANGIOPLASTY     no blockage   CYSTOSCOPY/URETEROSCOPY/HOLMIUM LASER/STENT PLACEMENT Right 02/02/2018   Procedure: CYSTOSCOPY/URETEROSCOPY//STENT PLACEMENT;  Surgeon: Cleon Gustin, MD;  Location: [facility] ORS;  Service: Urology;  Laterality: Right;   LEFT HEART CATHETERIZATION WITH CORONARY ANGIOGRAM N/A 12/11/2013   Procedure: LEFT HEART CATHETERIZATION WITH CORONARY ANGIOGRAM;  Surgeon: Josue Hector, MD;  Location: [facility] CATH LAB;  Service: Cardiovascular;  Laterality: N/A;    Family History  Problem Relation Age of Onset   Hypertension Mother    Heart disease Father 62   Hyperlipidemia Father    Colonic polyp Father    Diabetes Father    Diabetes Maternal Grandmother    Heart disease Maternal Grandmother     Stroke Maternal Grandfather    Colon cancer Neg Hx     Social History:  reports that he has never smoked. He has never used smokeless tobacco. He reports current alcohol use. He reports that he does not use drugs.   Review of Systems   Lipid history: He has been continuing Lipitor 20 mg daily for baseline LDL of 141, followed by PCP Recently  has been doing better with his diet  Last results are as follows:   Lab Results  Component Value Date   CHOL 103 12/26/2020   HDL 43.80 12/26/2020   LDLCALC 48 12/26/2020   LDLDIRECT 149.3 02/12/2013   TRIG 57.0 12/26/2020   CHOLHDL 2 12/26/2020           Hypertension:  treated with  25 mg losartan, prescribed by PCP   BP Readings from Last 3 Encounters:  03/06/21 140/84  12/31/20 132/80  12/23/20 138/74     Most recent eye exam was In 3/21, negative  Last foot exam 2/22  Physical Examination:  BP 140/84   Pulse 84   Ht '5\' 10"'$  (1.778 m)   Wt 184 lb 3.2 oz (83.6 kg)   SpO2 98%   BMI 26.43 kg/m       ASSESSMENT:  Diabetes type 2, Nonobese  See history of present illness for detailed discussion of current diabetes management, blood sugar patterns and problems identified   His A1c is much better at 6%  He has done very well with modifying his diet and is benefiting from consistent monitoring also  Only on 11/998 Xigduo and previously could not tolerate Rybelsus 3 mg sample   PLAN:   He will continue to take the Xigduo 1 tablet daily If he sees blood sugars are consistently going higher may consider increasing it to 2 tablets He can check blood sugars by rotation at different times and not necessarily every morning Encouraged him to find some time for brisk walking at least 4 days a week He may be a little more liberal with his diet with carbohydrate intake as long as it does not raise his blood sugars after eating Discussed that he does not need to lose more weight Continue regular monitoring and call if  blood sugars are getting higher otherwise follow-up in 4 months       There are no Patient Instructions on file for this visit.    Elayne Snare 03/06/2021, 9:47 AM   Note: This office note was prepared with Dragon voice recognition system technology. Any transcriptional errors that result from this process are unintentional.

## 2021-05-17 ENCOUNTER — Other Ambulatory Visit: Payer: Self-pay | Admitting: Endocrinology

## 2021-05-29 ENCOUNTER — Other Ambulatory Visit: Payer: Self-pay | Admitting: Family Medicine

## 2021-06-01 ENCOUNTER — Encounter: Payer: Self-pay | Admitting: Family Medicine

## 2021-06-01 NOTE — Telephone Encounter (Signed)
Called patient and mailbox was full. Sent mychart letter

## 2021-06-01 NOTE — Telephone Encounter (Signed)
Please schedule CPE with fasting labs prior with Dr. Diona Browner.  He was suppose to return back in July for this.

## 2021-06-11 ENCOUNTER — Telehealth: Payer: Self-pay | Admitting: Family Medicine

## 2021-06-11 NOTE — Telephone Encounter (Signed)
Noted  

## 2021-06-11 NOTE — Telephone Encounter (Signed)
Mearl Latin,   Please triage Mr. Strahm.

## 2021-06-11 NOTE — Telephone Encounter (Signed)
Pt called stating that he tested positive for covid. Pt would like advise on what to do. Please advise.

## 2021-06-11 NOTE — Telephone Encounter (Signed)
I spoke with pt; pt tested + covid home test on 06/10/21; 06/09/21 started with symptoms of S/T scratchy the first day; no S/T now. fever 101.9 and chills; pt's mom will bring pt tylenol this morning. Pt has body aches, feels weak, H/A pain level is 7; prod cough with white phlegm; runny nose and head congestion. No SOB, no CP,no diarrhea or vomiting; no loss of taste or smell; pt did take 2 covid vaccines. Self quarantine, drink plenty of fluids, rest, and take Tylenol for fever. UC & ED precautions given and pt voiced understanding. Pt already has appt scheduled with Dr Diona Browner for VV on 06/12/21 at [place]. Sending note to Dr Dionicio Stall CMA and will teams Butch Penny.

## 2021-06-11 NOTE — Telephone Encounter (Signed)
Paul Flowers,  Please call patient back and schedule virtual visit to discuss treatment options.

## 2021-06-12 ENCOUNTER — Telehealth (INDEPENDENT_AMBULATORY_CARE_PROVIDER_SITE_OTHER): Payer: Federal, State, Local not specified - PPO | Admitting: Family Medicine

## 2021-06-12 ENCOUNTER — Other Ambulatory Visit: Payer: Self-pay

## 2021-06-12 ENCOUNTER — Encounter: Payer: Self-pay | Admitting: Family Medicine

## 2021-06-12 VITALS — BP 109/84 | HR 123 | Temp 99.6°F | Ht 69.0 in | Wt 178.0 lb

## 2021-06-12 DIAGNOSIS — U071 COVID-19: Secondary | ICD-10-CM | POA: Diagnosis not present

## 2021-06-12 DIAGNOSIS — R000 Tachycardia, unspecified: Secondary | ICD-10-CM | POA: Insufficient documentation

## 2021-06-12 MED ORDER — NIRMATRELVIR/RITONAVIR (PAXLOVID)TABLET: 3.0000 | ORAL_TABLET | Freq: Two times a day (BID) | ORAL | 0 refills | Status: AC

## 2021-06-12 NOTE — Progress Notes (Signed)
VIRTUAL VISIT Due to national recommendations of social distancing due to [place] 19, a virtual visit is felt to be most appropriate for this patient at this time.   I connected with the patient on 06/12/21 at  9:00 AM EST by virtual telehealth platform and verified that I am speaking with the correct person using two identifiers.   I discussed the limitations, risks, security and privacy concerns of performing an evaluation and management service by  virtual telehealth platform and the availability of in person appointments. I also discussed with the patient that there may be a patient responsible charge related to this service. The patient expressed understanding and agreed to proceed.  Patient location: Home Provider Location: [place] Participants: Paul Flowers   Chief Complaint  Patient presents with   Covid Positive    Positive home test on Wednesday-Symptoms started Tuesday   Nasal Congestion   Cough    History of Present Illness:  50 year old male with diabetes presents with COVID infection.  Date of onset: 12/7   Started with ST and headache progressed to chills, fever 99.6 F-102.61F,  nasal congestion, dry cough.  No SOB no wheeze. Cough not keeping up at night.  Using tylenol for fever.  Using sudafed yesterday night.  He has been keeping up with liquids well, no UOP.    Not checking blood sugar.  COVID 19 screen COVID testing: 06/10/21 COVID vaccine: 4 vaccine doses COVID exposure: No recent travel or known exposure to [place]  The importance of social distancing was discussed today.  [place]   Review of Systems  Constitutional:  Positive for chills and fever.  HENT:  Positive for congestion. Negative for ear pain.   Eyes:  Negative for pain and redness.  Respiratory:  Positive for cough. Negative for shortness of breath.   Cardiovascular:  Negative for chest pain, palpitations and leg swelling.  Gastrointestinal:  Negative for abdominal  pain, blood in stool, constipation, diarrhea, nausea and vomiting.  Genitourinary:  Negative for dysuria.  Musculoskeletal:  Negative for falls and myalgias.  Skin:  Negative for rash.  Neurological:  Negative for dizziness.  Psychiatric/Behavioral:  Negative for depression. The patient is not nervous/anxious.      Past Medical History:  Diagnosis Date   Allergic rhinitis    ALLERGIC RHINITIS 01/19/2007   Diabetes ([place])    diet controlled   GERD 01/19/2007   GERD (gastroesophageal reflux disease)    Hypertension    Slight   Renal disorder    Sleep apnea 01/29/2011    reports that he has never smoked. He has never used smokeless tobacco. He reports current alcohol use. He reports that he does not use drugs.   Current Outpatient Medications:    atorvastatin (LIPITOR) 20 MG tablet, TAKE 1 TABLET BY MOUTH EVERY DAY, Disp: 90 tablet, Rfl: 3   glucose blood (ONETOUCH VERIO) test strip, Use to test blood sugar once daily, Disp: 100 each, Rfl: 5   losartan (COZAAR) 25 MG tablet, TAKE 1 TABLET BY MOUTH EVERY DAY, Disp: 90 tablet, Rfl: 0   mometasone (NASONEX) 50 MCG/ACT nasal spray, PLACE 2 SPRAYS INTO THE NOSE DAILY AS NEEDED (CONGESTION)., Disp: 51 g, Rfl: 3   montelukast (SINGULAIR) 10 MG tablet, TAKE 1 TABLET BY MOUTH EVERYDAY AT BEDTIME, Disp: 90 tablet, Rfl: 0   Multiple Vitamin (MULTIVITAMIN WITH MINERALS) TABS tablet, Take 1 tablet by mouth daily., Disp: , Rfl:    omeprazole (PRILOSEC) 20 MG capsule,  Take 1 capsule (20 mg total) by mouth daily., Disp: 90 capsule, Rfl: 3   ONE TOUCH LANCETS MISC, Use to test blood sugar once daily, Disp: 200 each, Rfl: 2   XIGDUO XR 11-998 MG TB24, TAKE 1 TABLET BY MOUTH EVERY DAY, Disp: 30 tablet, Rfl: 3   Observations/Objective: Blood pressure 109/84, pulse (!) 123, temperature 99.6 F (37.6 C), temperature source Oral, height 5\' 9"  (1.753 m), weight 178 lb (80.7 kg), SpO2 96 %.  Physical Exam  Physical Exam Constitutional:      General: The  patient is not in acute distress. Pulmonary:     Effort: Pulmonary effort is normal. No respiratory distress.  Neurological:     Mental Status: The patient is alert and oriented to person, place, and time.  Psychiatric:        Mood and Affect: Mood normal.        Behavior: Behavior normal.   Assessment and Plan Problem List Items Addressed This Visit     COVID-19 virus infection - Primary    COVID19  infection. No clear sign of bacterial infection at this time. Mild to moderate symptoms on day 2 of illness. No SOB.  No red flags/need for ER visit or in-person exam at respiratory clinic at this time..    Pt high risk for COVID complications given  Diabetes.  Discussed options to treat COVID .   GFR 104  Reviewed interactions of paxlovid and her medicaitons with patient  In detail.. he will hold or  adjust dose of medications as needed (cholesterol medication and nasal steroid). No absolute contraindications to paxlovid noted. If SOB begins symptoms worsening.. have low threshold for in-person exam, if severe shortness of breath ER visit recommended.   Can monitor Oxygen saturation at home with home monitor if able to obtain.  Go to ER if O2 sat < 90% on room air.  Reviewed home care and provided information through [place].  Recommended quarantine until test returns. If returns positive 5 days isolation recommended. Return to work day 6 and wear mask for 4 more days to complete 10 days. Provided info about prevention of spread of COVID 19.      Relevant Medications   nirmatrelvir/ritonavir EUA (PAXLOVID) 20 x 150 MG & 10 x 100MG  TABS   Tachycardia    Good po intake.. likely due to caffeine, decongestant and low grade fever... hold decongestant and push fluids. Call if not improving over next 24 hours.         I discussed the assessment and treatment plan with the patient. The patient was provided an opportunity to ask questions and all were answered. The patient agreed with  the plan and demonstrated an understanding of the instructions.   The patient was advised to call back or seek an in-person evaluation if the symptoms worsen or if the condition fails to improve as anticipated.     Eliezer Lofts, MD

## 2021-06-12 NOTE — Assessment & Plan Note (Signed)
COVID19  infection. No clear sign of bacterial infection at this time. Mild to moderate symptoms on day 2 of illness. No SOB.  No red flags/need for ER visit or in-person exam at respiratory clinic at this time..    Pt high risk for COVID complications given  Diabetes.  Discussed options to treat COVID .   GFR 104  Reviewed interactions of paxlovid and her medicaitons with patient  In detail.. he will hold or  adjust dose of medications as needed (cholesterol medication and nasal steroid). No absolute contraindications to paxlovid noted. If SOB begins symptoms worsening.. have low threshold for in-person exam, if severe shortness of breath ER visit recommended.   Can monitor Oxygen saturation at home with home monitor if able to obtain.  Go to ER if O2 sat < 90% on room air.  Reviewed home care and provided information through [place].  Recommended quarantine until test returns. If returns positive 5 days isolation recommended. Return to work day 6 and wear mask for 4 more days to complete 10 days. Provided info about prevention of spread of COVID 19.

## 2021-06-12 NOTE — Progress Notes (Signed)
Work note written as instructed by Dr. Diona Browner and sent to patient via [place].

## 2021-06-12 NOTE — Assessment & Plan Note (Signed)
Good po intake.. likely due to caffeine, decongestant and low grade fever... hold decongestant and push fluids. Call if not improving over next 24 hours.

## 2021-06-24 ENCOUNTER — Other Ambulatory Visit: Payer: Self-pay | Admitting: Family Medicine

## 2021-07-09 ENCOUNTER — Other Ambulatory Visit: Payer: Self-pay

## 2021-07-09 ENCOUNTER — Other Ambulatory Visit (INDEPENDENT_AMBULATORY_CARE_PROVIDER_SITE_OTHER): Payer: Federal, State, Local not specified - PPO

## 2021-07-09 DIAGNOSIS — E119 Type 2 diabetes mellitus without complications: Secondary | ICD-10-CM

## 2021-07-09 LAB — COMPREHENSIVE METABOLIC PANEL
ALT: 20 U/L (ref 0–53)
AST: 17 U/L (ref 0–37)
Albumin: 4.3 g/dL (ref 3.5–5.2)
Alkaline Phosphatase: 57 U/L (ref 39–117)
BUN: 11 mg/dL (ref 6–23)
CO2: 27 mEq/L (ref 19–32)
Calcium: 9.6 mg/dL (ref 8.4–10.5)
Chloride: 104 mEq/L (ref 96–112)
Creatinine, Ser: 0.73 mg/dL (ref 0.40–1.50)
GFR: 104.69 mL/min (ref >60.00)
Glucose, Bld: 97 mg/dL (ref 70–99)
Potassium: 4 mEq/L (ref 3.5–5.1)
Sodium: 139 mEq/L (ref 135–145)
Total Bilirubin: 1.2 mg/dL (ref 0.2–1.2)
Total Protein: 7.1 g/dL (ref 6.0–8.3)

## 2021-07-09 LAB — HEMOGLOBIN A1C: Hgb A1c MFr Bld: 6.1 % (ref 4.6–6.5)

## 2021-07-13 ENCOUNTER — Encounter: Payer: Self-pay | Admitting: Endocrinology

## 2021-07-13 ENCOUNTER — Other Ambulatory Visit: Payer: Self-pay

## 2021-07-13 ENCOUNTER — Ambulatory Visit: Payer: Federal, State, Local not specified - PPO | Admitting: Endocrinology

## 2021-07-13 VITALS — BP 130/84 | HR 105 | Ht 70.0 in | Wt 180.6 lb

## 2021-07-13 DIAGNOSIS — E119 Type 2 diabetes mellitus without complications: Secondary | ICD-10-CM | POA: Diagnosis not present

## 2021-07-13 NOTE — Progress Notes (Signed)
Patient ID: Paul Flowers, male   DOB: Apr 11, 1971, 51 y.o.   MRN: [id number]            Reason for Appointment: Follow-up of diabetes  Referring physician: Diona Browner    History of Present Illness:          Date of diagnosis of type 2 diabetes mellitus: 12/17        Background history:   He had been told to have prediabetes about 2 years ago with highest A1c only 6.5 but no significant hyperglycemia He had not been following any particular diet or exercising and his A1c initially came down to 6.1 However in 12/17 his A1c was up to 6.8 In the third week of January 2018 he started having fatigue, severely increased thirst, frequent urination and was found to have marked hyperglycemia with blood sugar of 620 without Acidosis , had small ketones in the urine  He was switched from metformin to Janumet XR on his initial consultation in 08/2016 because of diarrhea with metformin  Recent history:   Non-insulin hypoglycemic drugs the patient is taking are: Xigduo XR 11/998 daily    His A1c is 6.1, previously was 6 and high at 7.7 in 2022   Current management, blood sugar patterns and problems identified:  He is continuing on 1 tablet of Xigduo daily and this was not increased since his control was good He is very inconsistent with checking his blood sugars  Unclear whether his blood sugar monitor is accurate since he has unusually high reading of 171 last month along with a reading of 61 without symptoms  Mostly checking fasting readings and occasionally before dinner only He has not done any formal exercise and only a little walking or push-ups  Lab glucose was 97 fasting For breakfast he will eat toast and scrambled eggs as well as some bacon Weight is about the same Overall he is trying to watch his diet and sweets as well as carbohydrates        Side effects from medications have been: diarrhea from metformin  Glucose monitoring:  done 0.5 times a day         Glucometer:   [place]  Blood Glucose readings from meter for the last 30 days  Recent range 61-171 with only sporadic readings mostly before breakfast or dinner In the last week range 100-111  Previously:  PRE-MEAL Fasting Lunch Dinner Bedtime Overall  Glucose range: 93-157   101-121 78-166  Mean/median:     111      Self-care: The diet that the patient has been following is: tries to limit  Carbohydrates, sweets and drinks with sugar.     Meal times are:  Breakfast is at  7 AM Lunch: at work during [employer]: 8 pm                Dietician visit, most recent: 10/2016  Weight history:  Wt Readings from Last 3 Encounters:  07/13/21 180 lb 9.6 oz (81.9 kg)  06/12/21 178 lb (80.7 kg)  03/06/21 184 lb 3.2 oz (83.6 kg)    Glycemic control:   Lab Results  Component Value Date   HGBA1C 6.1 07/09/2021   HGBA1C 6.0 02/27/2021   HGBA1C 7.7 (A) 12/04/2020   Lab Results  Component Value Date   MICROALBUR 0.9 12/26/2020   LDLCALC 48 12/26/2020   CREATININE 0.73 07/09/2021   Lab Results  Component Value Date   MICRALBCREAT 0.6 12/26/2020    Lab Results  Component Value Date   FRUCTOSAMINE 277 09/15/2016     Allergies as of 07/13/2021   No Known Allergies      Medication List        Accurate as of July 13, 2021  4:06 PM. If you have any questions, ask your nurse or doctor.          atorvastatin 20 MG tablet Commonly known as: LIPITOR TAKE 1 TABLET BY MOUTH EVERY DAY   glucose blood test strip Commonly known as: OneTouch Verio Use to test blood sugar once daily   losartan 25 MG tablet Commonly known as: COZAAR TAKE 1 TABLET BY MOUTH EVERY DAY   mometasone 50 MCG/ACT nasal spray Commonly known as: NASONEX PLACE 2 SPRAYS INTO THE NOSE DAILY AS NEEDED (CONGESTION).   montelukast 10 MG tablet Commonly known as: SINGULAIR TAKE 1 TABLET BY MOUTH EVERYDAY AT BEDTIME   multivitamin with minerals Tabs tablet Take 1 tablet by mouth daily.   omeprazole 20 MG  capsule Commonly known as: PRILOSEC Take 1 capsule (20 mg total) by mouth daily.   ONE TOUCH LANCETS Misc Use to test blood sugar once daily   Xigduo XR 11-998 MG Tb24 Generic drug: Dapagliflozin-metFORMIN HCl ER TAKE 1 TABLET BY MOUTH EVERY DAY        Allergies: No Known Allergies  Past Medical History:  Diagnosis Date   Allergic rhinitis    ALLERGIC RHINITIS 01/19/2007   Diabetes ([place])    diet controlled   GERD 01/19/2007   GERD (gastroesophageal reflux disease)    Hypertension    Slight   Renal disorder    Sleep apnea 01/29/2011    Past Surgical History:  Procedure Laterality Date   COLONOSCOPY WITH PROPOFOL N/A 02/25/2020   Procedure: COLONOSCOPY WITH PROPOFOL;  Surgeon: Jonathon Bellows, MD;  Location: [facility] ENDOSCOPY;  Service: Gastroenterology;  Laterality: N/A;   CORONARY ANGIOPLASTY     no blockage   CYSTOSCOPY/URETEROSCOPY/HOLMIUM LASER/STENT PLACEMENT Right 02/02/2018   Procedure: CYSTOSCOPY/URETEROSCOPY//STENT PLACEMENT;  Surgeon: Cleon Gustin, MD;  Location: [facility] ORS;  Service: Urology;  Laterality: Right;   LEFT HEART CATHETERIZATION WITH CORONARY ANGIOGRAM N/A 12/11/2013   Procedure: LEFT HEART CATHETERIZATION WITH CORONARY ANGIOGRAM;  Surgeon: Josue Hector, MD;  Location[facility] CATH LAB;  Service: Cardiovascular;  Laterality: N/A;    Family History  Problem Relation Age of Onset   Hypertension Mother    Heart disease Father 2   Hyperlipidemia Father    Colonic polyp Father    Diabetes Father    Diabetes Maternal Grandmother    Heart disease Maternal Grandmother    Stroke Maternal Grandfather    Colon cancer Neg Hx     Social History:  reports that he has never smoked. He has never used smokeless tobacco. He reports current alcohol use. He reports that he does not use drugs.   Review of Systems   Lipid history: He has been continuing Lipitor 20 mg daily for baseline LDL of 141, followed by PCP Recently has been doing better with his diet  Last  results are as follows:   Lab Results  Component Value Date   CHOL 103 12/26/2020   HDL 43.80 12/26/2020   LDLCALC 48 12/26/2020   LDLDIRECT 149.3 02/12/2013   TRIG 57.0 12/26/2020   CHOLHDL 2 12/26/2020           Hypertension:  treated with  25 mg losartan, prescribed by PCP   BP Readings from Last 3 Encounters:  07/13/21 130/84  06/12/21 109/84  03/06/21 140/84     Most recent eye exam was In 3/21, negative  Last foot exam 2/22  Physical Examination:  BP 130/84    Pulse (!) 105    Ht 5\' 10"  (1.778 m)    Wt 180 lb 9.6 oz (81.9 kg)    SpO2 98%    BMI 25.91 kg/m       ASSESSMENT:  Diabetes type 2, Nonobese  See history of present illness for detailed discussion of current diabetes management, blood sugar patterns and problems identified   His A1c is about the same at 6.1  Only on 11/998 Xigduo as before and blood sugars are still well controlled He appears to do much better when he is consistent with diet and keeping his weight down However recently has not been exercising much previously could not tolerate Rybelsus 3 mg sample   PLAN:   He will continue to take only Xigduo 1 tablet daily Reminded him to start checking blood sugars more consistently at various times especially after meals  To start some kind of exercise program including aerobic activity like walking at lunchtime  Unless his blood sugars are higher he can come back again in 4 months for follow-up   Continue follow-up with PCP for blood pressure and lipids   There are no Patient Instructions on file for this visit.    Elayne Snare 07/13/2021, 4:06 PM   Note: This office note was prepared with Dragon voice recognition system technology. Any transcriptional errors that result from this process are unintentional.

## 2021-07-13 NOTE — Patient Instructions (Signed)
Walk daily 

## 2021-07-16 DIAGNOSIS — G4733 Obstructive sleep apnea (adult) (pediatric): Secondary | ICD-10-CM | POA: Diagnosis not present

## 2021-08-29 ENCOUNTER — Other Ambulatory Visit: Payer: Self-pay | Admitting: Family Medicine

## 2021-08-31 NOTE — Telephone Encounter (Signed)
Please schedule CPE with fasting labs prior for Dr. Bedsole. 

## 2021-09-13 DIAGNOSIS — G4733 Obstructive sleep apnea (adult) (pediatric): Secondary | ICD-10-CM | POA: Diagnosis not present

## 2021-09-28 ENCOUNTER — Other Ambulatory Visit: Payer: Self-pay | Admitting: Endocrinology

## 2021-10-09 NOTE — Telephone Encounter (Signed)
Sent pt a MyChart message to set up CPE and fasting Labs. ?

## 2021-11-05 LAB — HM DIABETES EYE EXAM

## 2021-11-11 ENCOUNTER — Other Ambulatory Visit: Payer: Self-pay | Admitting: Family Medicine

## 2021-11-20 ENCOUNTER — Other Ambulatory Visit: Payer: Federal, State, Local not specified - PPO

## 2021-11-23 ENCOUNTER — Ambulatory Visit: Payer: Federal, State, Local not specified - PPO | Admitting: Endocrinology

## 2021-11-26 ENCOUNTER — Ambulatory Visit: Payer: Federal, State, Local not specified - PPO | Admitting: Endocrinology

## 2021-11-27 ENCOUNTER — Other Ambulatory Visit: Payer: Self-pay | Admitting: Family Medicine

## 2021-12-04 ENCOUNTER — Encounter: Payer: Self-pay | Admitting: Endocrinology

## 2021-12-04 ENCOUNTER — Encounter: Payer: Self-pay | Admitting: Family Medicine

## 2021-12-14 ENCOUNTER — Other Ambulatory Visit (INDEPENDENT_AMBULATORY_CARE_PROVIDER_SITE_OTHER): Payer: Federal, State, Local not specified - PPO

## 2021-12-14 DIAGNOSIS — E119 Type 2 diabetes mellitus without complications: Secondary | ICD-10-CM | POA: Diagnosis not present

## 2021-12-14 LAB — BASIC METABOLIC PANEL
BUN: 12 mg/dL (ref 6–23)
CO2: 28 mEq/L (ref 19–32)
Calcium: 9.2 mg/dL (ref 8.4–10.5)
Chloride: 103 mEq/L (ref 96–112)
Creatinine, Ser: 0.87 mg/dL (ref 0.40–1.50)
GFR: 98.99 mL/min (ref >60.00)
Glucose, Bld: 107 mg/dL — ABNORMAL HIGH (ref 70–99)
Potassium: 4 mEq/L (ref 3.5–5.1)
Sodium: 139 mEq/L (ref 135–145)

## 2021-12-14 LAB — HEMOGLOBIN A1C: Hgb A1c MFr Bld: 6.3 % (ref 4.6–6.5)

## 2021-12-16 ENCOUNTER — Other Ambulatory Visit: Payer: Self-pay | Admitting: Family Medicine

## 2021-12-17 ENCOUNTER — Ambulatory Visit: Payer: Federal, State, Local not specified - PPO | Admitting: Endocrinology

## 2021-12-17 ENCOUNTER — Encounter: Payer: Self-pay | Admitting: Endocrinology

## 2021-12-17 VITALS — BP 130/82 | HR 95 | Ht 70.0 in | Wt 179.4 lb

## 2021-12-17 DIAGNOSIS — E119 Type 2 diabetes mellitus without complications: Secondary | ICD-10-CM

## 2021-12-17 NOTE — Progress Notes (Signed)
Patient ID: Paul Flowers, male   DOB: 01/07/1971, 51 y.o.   MRN: [id number]            Reason for Appointment: Follow-up of diabetes  Referring physician: Diona Browner    History of Present Illness:          Date of diagnosis of type 2 diabetes mellitus: 12/17        Background history:   He had been told to have prediabetes about 2 years ago with highest A1c only 6.5 but no significant hyperglycemia He had not been following any particular diet or exercising and his A1c initially came down to 6.1 However in 12/17 his A1c was up to 6.8 In the third week of January 2018 he started having fatigue, severely increased thirst, frequent urination and was found to have marked hyperglycemia with blood sugar of 620 without Acidosis , had small ketones in the urine  He was switched from metformin to Janumet XR on his initial consultation in 08/2016 because of diarrhea with metformin  Recent history:   Non-insulin hypoglycemic drugs the patient is taking are: Xigduo XR 11/998 daily    His A1c is 6.3, previously was 6.1 and high at 7.7 in 2022   Current management, blood sugar patterns and problems identified:  He is on 1 tablet of Xigduo daily which has been adequate for his control  Again he is checking his blood sugars very sporadically at home and mostly before breakfast and dinner  He says that once when he checked his sugar early morning at 5:30 AM which was 167 otherwise blood sugars are in the low 100 range  Recent average blood sugar is 103 with lowest reading 78  this was not increased since his control was good Overall he is still trying to watch high carbohydrate and high fat meals and avoiding any sweet drinks He has done some exercise with walking or push-ups  Lab glucose was 107 fasting For breakfast he has toast and scrambled eggs as well as some bacon Weight is about the same        Side effects from medications have been: diarrhea from metformin  Glucose monitoring:   done 0.5 times a day         Glucometer:  [place]  Blood Glucose readings from meter for the last 30 days  Recent range 61-171 with only sporadic readings mostly before breakfast or dinner In the last week range 100-111   Self-care: The diet that the patient has been following is: tries to limit  Carbohydrates, sweets and drinks with sugar.     Meal times are:  Breakfast is at  7 AM Lunch: at work during [employer]: 8 pm                Dietician visit, most recent: 10/2016  Weight history:  Wt Readings from Last 3 Encounters:  12/17/21 179 lb 6.4 oz (81.4 kg)  07/13/21 180 lb 9.6 oz (81.9 kg)  06/12/21 178 lb (80.7 kg)    Glycemic control:   Lab Results  Component Value Date   HGBA1C 6.3 12/14/2021   HGBA1C 6.1 07/09/2021   HGBA1C 6.0 02/27/2021   Lab Results  Component Value Date   MICROALBUR 0.9 12/26/2020   LDLCALC 48 12/26/2020   CREATININE 0.87 12/14/2021   Lab Results  Component Value Date   MICRALBCREAT 0.6 12/26/2020    Lab Results  Component Value Date   FRUCTOSAMINE 277 09/15/2016     Allergies  as of 12/17/2021   No Known Allergies      Medication List        Accurate as of December 17, 2021 11:48 AM. If you have any questions, ask your nurse or doctor.          atorvastatin 20 MG tablet Commonly known as: LIPITOR TAKE 1 TABLET BY MOUTH EVERY DAY   glucose blood test strip Commonly known as: OneTouch Verio Use to test blood sugar once daily   losartan 25 MG tablet Commonly known as: COZAAR TAKE 1 TABLET BY MOUTH EVERY DAY   mometasone 50 MCG/ACT nasal spray Commonly known as: NASONEX PLACE 2 SPRAYS INTO THE NOSE DAILY AS NEEDED (CONGESTION).   montelukast 10 MG tablet Commonly known as: SINGULAIR TAKE 1 TABLET BY MOUTH EVERYDAY AT BEDTIME   multivitamin with minerals Tabs tablet Take 1 tablet by mouth daily.   omeprazole 20 MG capsule Commonly known as: PRILOSEC TAKE 1 CAPSULE BY MOUTH EVERY DAY   ONE TOUCH LANCETS  Misc Use to test blood sugar once daily   Xigduo XR 11-998 MG Tb24 Generic drug: Dapagliflozin-metFORMIN HCl ER TAKE 1 TABLET BY MOUTH EVERY DAY        Allergies: No Known Allergies  Past Medical History:  Diagnosis Date   Allergic rhinitis    ALLERGIC RHINITIS 01/19/2007   Diabetes ([place])    diet controlled   GERD 01/19/2007   GERD (gastroesophageal reflux disease)    Hypertension    Slight   Renal disorder    Sleep apnea 01/29/2011    Past Surgical History:  Procedure Laterality Date   COLONOSCOPY WITH PROPOFOL N/A 02/25/2020   Procedure: COLONOSCOPY WITH PROPOFOL;  Surgeon: Jonathon Bellows, MD;  Location: [facility] ENDOSCOPY;  Service: Gastroenterology;  Laterality: N/A;   CORONARY ANGIOPLASTY     no blockage   CYSTOSCOPY/URETEROSCOPY/HOLMIUM LASER/STENT PLACEMENT Right 02/02/2018   Procedure: CYSTOSCOPY/URETEROSCOPY//STENT PLACEMENT;  Surgeon: Cleon Gustin, MD;  Location: [facility] ORS;  Service: Urology;  Laterality: Right;   LEFT HEART CATHETERIZATION WITH CORONARY ANGIOGRAM N/A 12/11/2013   Procedure: LEFT HEART CATHETERIZATION WITH CORONARY ANGIOGRAM;  Surgeon: Josue Hector, MD;  Location: [facility] CATH LAB;  Service: Cardiovascular;  Laterality: N/A;    Family History  Problem Relation Age of Onset   Hypertension Mother    Heart disease Father 37   Hyperlipidemia Father    Colonic polyp Father    Diabetes Father    Diabetes Maternal Grandmother    Heart disease Maternal Grandmother    Stroke Maternal Grandfather    Colon cancer Neg Hx     Social History:  reports that he has never smoked. He has never used smokeless tobacco. He reports current alcohol use. He reports that he does not use drugs.   Review of Systems   Lipid history: He has been continuing Lipitor 20 mg daily for baseline LDL of 141, followed by PCP  Last results are as follows:   Lab Results  Component Value Date   CHOL 103 12/26/2020   HDL 43.80 12/26/2020   LDLCALC 48 12/26/2020   LDLDIRECT 149.3  02/12/2013   TRIG 57.0 12/26/2020   CHOLHDL 2 12/26/2020           Hypertension:  treated with  25 mg losartan, prescribed by PCP   BP Readings from Last 3 Encounters:  12/17/21 130/82  07/13/21 130/84  06/12/21 109/84     Most recent eye exam was In 11/2021, negative  Last foot exam 2/22  Physical  Examination:  BP 130/82   Pulse 95   Ht '5\' 10"'$  (1.778 m)   Wt 179 lb 6.4 oz (81.4 kg)   SpO2 97%   BMI 25.74 kg/m       ASSESSMENT:  Diabetes type 2, Nonobese  See history of present illness for detailed discussion of current diabetes management, blood sugar patterns and problems identified   His A1c is about the same at 6.3  He is on 11/998 Xigduo 1 tablet daily as before and blood sugars are consistently well controlled Although he has not checked readings after meals they are fairly close to normal He is trying to be more active Discussed that his BMI is 25 and he can continue to maintain his weight  Urine microalbumin normal  PLAN:   He will continue to take only Xigduo 1 tablet daily Reminded him to start checking blood sugars after meals more often than before dinner Still have a regular exercise program including aerobic activity like walking at lunchtime     Continue follow-up with PCP for blood pressure and lipids   Patient Instructions  Check blood sugars on waking up 2-3 days a week  Also check blood sugars about 2 hours after meals and do this after different meals by rotation  Recommended blood sugar levels on waking up are 90-130 and about 2 hours after meal is 130-160  Please bring your blood sugar monitor to each visit, thank you     Elayne Snare 12/17/2021, 11:48 AM   Note: This office note was prepared with Dragon voice recognition system technology. Any transcriptional errors that result from this process are unintentional.

## 2021-12-17 NOTE — Patient Instructions (Signed)
Check blood sugars on waking up 2-3 days a week  Also check blood sugars about 2 hours after meals and do this after different meals by rotation  Recommended blood sugar levels on waking up are 90-130 and about 2 hours after meal is 130-160  Please bring your blood sugar monitor to each visit, thank you   

## 2021-12-18 LAB — MICROALBUMIN / CREATININE URINE RATIO
Creatinine,U: 56.6 mg/dL
Microalb Creat Ratio: 1.2 mg/g (ref 0.0–30.0)
Microalb, Ur: <0.7 mg/dL (ref 0.0–1.9)

## 2022-01-20 ENCOUNTER — Telehealth: Payer: Self-pay | Admitting: Family Medicine

## 2022-01-20 NOTE — Telephone Encounter (Signed)
[facility] - Client TELEPHONE ADVICE RECORD [facility] Patient Name: Paul Flowers Gender: Male DOB: 05/27/1971 Age: 51 Y 11 M 5 D Return Phone Number: [phone] (Primary) Address: City/ State/ Zip: [place] [address] Client [facility] - Client Client Site [place] - Day Provider Eliezer Lofts - MD Contact Type Call Who Is Calling Patient / Member / Family / Caregiver Call Type Triage / Clinical Relationship To Patient Self Return Phone Number [phone] (Primary) Chief Complaint Back Pain - General Reason for Call Symptomatic / Request for [place] states he is having lower back pain on the right side that started on tuesday juny 27th. Caller states he feels is a nerve pressing on his right side. Translation No Nurse Assessment Nurse: [place], RN, Estill Bamberg Date/Time (Eastern Time): 01/20/2022 11:26:07 AM Confirm and document reason for call. If symptomatic, describe symptoms. ---caller states he ahs had lower back pain for 3 weeks. saw a provider and then it felt better. then started again on lower right side. feels the pain with certain movements. Does the patient have any new or worsening symptoms? ---Yes Will a triage be completed? ---Yes Related visit to physician within the last 2 weeks? ---Yes Does the PT have any chronic conditions? (i.e. diabetes, asthma, this includes High risk factors for pregnancy, etc.) ---No Is this a behavioral health or substance abuse call? ---No Guidelines Guideline Title Affirmed Question Affirmed Notes Nurse Date/Time (Eastern Time) Back Pain [1] MODERATE back pain (e.g., interferes with normal activities) AND [2] present > 3 days Humfleet, RN, Estill Bamberg 01/20/2022 11:28:15 AM Disp. Time Eilene Ghazi Time) Disposition Final User 01/20/2022 11:33:35 AM SEE PCP WITHIN 3 DAYS Yes Humfleet, RN, Estill Bamberg PLEASE NOTE:  All timestamps contained within this report are represented as [place] Standard Time. CONFIDENTIALTY NOTICE: This fax transmission is intended only for the addressee. It contains information that is legally privileged, confidential or otherwise protected from use or disclosure. If you are not the intended recipient, you are strictly prohibited from reviewing, disclosing, copying using or disseminating any of this information or taking any action in reliance on or regarding this information. If you have received this fax in error, please notify us immediately by telephone so that we can arrange for its return to [place]. Phone: [phone], Toll-Free: [phone], Fax: [phone] Page: 2 of 2 Call Id: [id number] Final Disposition 01/20/2022 11:33:35 AM SEE PCP WITHIN 3 DAYS Yes [place], RN, Shelly Coss Disagree/Comply Comply Caller Understands Yes PreDisposition Did not know what to do Care Advice Given Per Guideline SEE PCP WITHIN 3 DAYS: * You need to be seen within 2 or 3 days. * PCP VISIT: Call your doctor (or NP/PA) during regular office hours and make an appointment. A clinic or urgent care center are good places to go for care if your doctor's office is closed or you can't get an appointment. NOTE: If office will be open tomorrow, tell caller to call then, not in 3 days. CARE ADVICE given per Back Pain (Adult) guideline. * You become worse CALL BACK IF: USE HEAT: * Use a heat pack, heating pad, or warm wet washcloth. * Do this for 10 minutes three times a day. * This will help increase blood flow and decrease pain. SLEEP: * Sleep on your side with a pillow between your knees. * If you sleep on your back, place a pillow under your knees. * IBUPROFEN (E.G., MOTRIN, ADVIL): Take 400 mg (two  200 mg pills) by mouth every 6 hours. The most you should take is 6 pills a day (1,200 mg total). * Numbness or weakness occurs, or bowel/bladder problems Comments User: Rozelle Logan, RN Date/Time  Eilene Ghazi Time): 01/20/2022 11:34:31 AM repeated back instructions. advised 2 ibuprofen every 6 hours and to stop and call for abd pain. Referrals REFERRED TO PCP OFFIC

## 2022-01-20 NOTE — Telephone Encounter (Signed)
Ok thank you will see him tomorrow as scheduled Agree with recommendations

## 2022-01-20 NOTE — Telephone Encounter (Signed)
Patient called and stated that he is having lower back pain, pain scale is 5 and when he moves it can move 7 to 8 on pain scale. Patient was triaged.

## 2022-01-20 NOTE — Telephone Encounter (Signed)
Per appt notes pt already has appt with T Dugal FNP 01/21/22 at 9:40. Sending note to Eugenia Pancoast FNP and Claiborne Billings CMA.

## 2022-01-21 ENCOUNTER — Ambulatory Visit (INDEPENDENT_AMBULATORY_CARE_PROVIDER_SITE_OTHER)
Admission: RE | Admit: 2022-01-21 | Discharge: 2022-01-21 | Disposition: A | Discharge status: Home / Self Care | Payer: Federal, State, Local not specified - PPO | Source: Ambulatory Visit | Attending: Family | Admitting: Family

## 2022-01-21 ENCOUNTER — Encounter: Payer: Self-pay | Admitting: Family

## 2022-01-21 ENCOUNTER — Ambulatory Visit: Payer: Federal, State, Local not specified - PPO | Admitting: Family

## 2022-01-21 VITALS — BP 128/60 | HR 91 | Temp 98.7°F | Resp 16 | Ht 70.0 in | Wt 179.5 lb

## 2022-01-21 DIAGNOSIS — M545 Low back pain, unspecified: Secondary | ICD-10-CM | POA: Diagnosis not present

## 2022-01-21 DIAGNOSIS — M62838 Other muscle spasm: Secondary | ICD-10-CM | POA: Diagnosis not present

## 2022-01-21 DIAGNOSIS — G8929 Other chronic pain: Secondary | ICD-10-CM

## 2022-01-21 DIAGNOSIS — M5441 Lumbago with sciatica, right side: Secondary | ICD-10-CM | POA: Diagnosis not present

## 2022-01-21 MED ORDER — KETOROLAC TROMETHAMINE 60 MG/2ML IM SOLN
60.0000 mg | Freq: Once | INTRAMUSCULAR | Status: AC
  Administered 2022-01-21: 60 mg via INTRAMUSCULAR

## 2022-01-21 MED ORDER — IBUPROFEN 800 MG PO TABS
800.0000 mg | ORAL_TABLET | Freq: Three times a day (TID) | ORAL | 0 refills | Status: DC | PRN
Start: 2022-01-21 — End: 2022-03-17

## 2022-01-21 MED ORDER — TIZANIDINE HCL 2 MG PO TABS: ORAL_TABLET | ORAL | 0 refills | Status: DC
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
## 2022-01-21 NOTE — Addendum Note (Signed)
Addended by: Eugenia Pancoast on: 01/21/2022 10:26 AM   Modules accepted: Orders

## 2022-01-21 NOTE — Assessment & Plan Note (Signed)
rx tizanidine 2 mg prn muscle spasm  Sciatica exercises as tolerated

## 2022-01-21 NOTE — Patient Instructions (Addendum)
Complete xray(s) prior to leaving today. I will notify you of your results once received.  A referral was placed today for physical therapy Please let us know if you have not heard back within 2 weeks about the referral.  Due to recent changes in healthcare laws, you may see results of your imaging and/or laboratory studies on MyChart before I have had a chance to review them.  I understand that in some cases there may be results that are confusing or concerning to you. Please understand that not all results are received at the same time and often I may need to interpret multiple results in order to provide you with the best plan of care or course of treatment. Therefore, I ask that you please give me 2 business days to thoroughly review all your results before contacting my office for clarification. Should we see a critical lab result, you will be contacted sooner.   It was a pleasure seeing you today! Please do not hesitate to reach out with any questions and or concerns.  Regards,   Eugenia Pancoast FNP-C

## 2022-01-21 NOTE — Assessment & Plan Note (Addendum)
Order lumbar spine xray pending results Referral to physical therapy toradol injection today 60 mg Exercises as tolerated Heat to site  rx ibuprofen 800 mg po tid prn

## 2022-01-22 NOTE — Progress Notes (Signed)
You do have curvature of the spine/ scoliosis.  I think physical therapy would be helpful for you on this.

## 2022-01-28 ENCOUNTER — Other Ambulatory Visit (INDEPENDENT_AMBULATORY_CARE_PROVIDER_SITE_OTHER): Payer: Federal, State, Local not specified - PPO

## 2022-01-28 ENCOUNTER — Telehealth: Payer: Self-pay | Admitting: Family Medicine

## 2022-01-28 DIAGNOSIS — Z125 Encounter for screening for malignant neoplasm of prostate: Secondary | ICD-10-CM

## 2022-01-28 DIAGNOSIS — Z79899 Other long term (current) drug therapy: Secondary | ICD-10-CM

## 2022-01-28 DIAGNOSIS — E1169 Type 2 diabetes mellitus with other specified complication: Secondary | ICD-10-CM

## 2022-01-28 DIAGNOSIS — E7800 Pure hypercholesterolemia, unspecified: Secondary | ICD-10-CM

## 2022-01-28 DIAGNOSIS — E785 Hyperlipidemia, unspecified: Secondary | ICD-10-CM | POA: Diagnosis not present

## 2022-01-28 LAB — CBC WITH DIFFERENTIAL/PLATELET
Basophils Absolute: 0.1 10*3/uL (ref 0.0–0.1)
Basophils Relative: 1.3 % (ref 0.0–3.0)
Eosinophils Absolute: 0.2 10*3/uL (ref 0.0–0.7)
Eosinophils Relative: 3.5 % (ref 0.0–5.0)
HCT: 42.4 % (ref 39.0–52.0)
Hemoglobin: 14.2 g/dL (ref 13.0–17.0)
Lymphocytes Relative: 28.4 % (ref 12.0–46.0)
Lymphs Abs: 2 10*3/uL (ref 0.7–4.0)
MCHC: 33.5 g/dL (ref 30.0–36.0)
MCV: 86.8 fl (ref 78.0–100.0)
Monocytes Absolute: 0.7 10*3/uL (ref 0.1–1.0)
Monocytes Relative: 10.8 % (ref 3.0–12.0)
Neutro Abs: 3.9 10*3/uL (ref 1.4–7.7)
Neutrophils Relative %: 56 % (ref 43.0–77.0)
Platelets: 291 10*3/uL (ref 150.0–400.0)
RBC: 4.88 Mil/uL (ref 4.22–5.81)
RDW: 14 % (ref 11.5–15.5)
WBC: 6.9 10*3/uL (ref 4.0–10.5)

## 2022-01-28 LAB — HEPATIC FUNCTION PANEL
ALT: 16 U/L (ref 0–53)
AST: 15 U/L (ref 0–37)
Albumin: 4.2 g/dL (ref 3.5–5.2)
Alkaline Phosphatase: 66 U/L (ref 39–117)
Bilirubin, Direct: 0.2 mg/dL (ref 0.0–0.3)
Total Bilirubin: 0.8 mg/dL (ref 0.2–1.2)
Total Protein: 7 g/dL (ref 6.0–8.3)

## 2022-01-28 LAB — LIPID PANEL
Cholesterol: 139 mg/dL (ref 0–200)
HDL: 64.4 mg/dL (ref >39.00)
LDL Cholesterol: 60 mg/dL (ref 0–99)
NonHDL: 74.57
Total CHOL/HDL Ratio: 2
Triglycerides: 71 mg/dL (ref 0.0–149.0)
VLDL: 14.2 mg/dL (ref 0.0–40.0)

## 2022-01-28 LAB — PSA: PSA: 0.34 ng/mL (ref 0.10–4.00)

## 2022-01-28 NOTE — Telephone Encounter (Signed)
-----   Message from Ellamae Sia sent at 01/18/2022 11:18 AM EDT ----- Regarding: Lab orders for Thursday, 7.27.23 Patient is scheduled for CPX labs, please order future labs, Thanks , Karna Christmas

## 2022-01-28 NOTE — Progress Notes (Signed)
No critical labs need to be addressed urgently. We will discuss labs in detail at upcoming office visit.   

## 2022-02-01 ENCOUNTER — Ambulatory Visit: Payer: Federal, State, Local not specified - PPO | Attending: Family Medicine

## 2022-02-01 DIAGNOSIS — R293 Abnormal posture: Secondary | ICD-10-CM | POA: Diagnosis not present

## 2022-02-01 DIAGNOSIS — M6281 Muscle weakness (generalized): Secondary | ICD-10-CM | POA: Insufficient documentation

## 2022-02-01 DIAGNOSIS — M5441 Lumbago with sciatica, right side: Secondary | ICD-10-CM | POA: Diagnosis not present

## 2022-02-01 DIAGNOSIS — M5459 Other low back pain: Secondary | ICD-10-CM | POA: Insufficient documentation

## 2022-02-01 DIAGNOSIS — M62838 Other muscle spasm: Secondary | ICD-10-CM | POA: Insufficient documentation

## 2022-02-01 DIAGNOSIS — G8929 Other chronic pain: Secondary | ICD-10-CM | POA: Diagnosis not present
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
## 2022-02-04 ENCOUNTER — Telehealth: Payer: Self-pay

## 2022-02-04 ENCOUNTER — Encounter: Payer: Self-pay | Admitting: Family Medicine

## 2022-02-04 ENCOUNTER — Other Ambulatory Visit (HOSPITAL_COMMUNITY): Payer: Self-pay

## 2022-02-04 ENCOUNTER — Ambulatory Visit (INDEPENDENT_AMBULATORY_CARE_PROVIDER_SITE_OTHER): Payer: Federal, State, Local not specified - PPO | Admitting: Family Medicine

## 2022-02-04 VITALS — BP 110/64 | HR 87 | Temp 98.0°F | Ht 69.5 in | Wt 180.5 lb

## 2022-02-04 DIAGNOSIS — E785 Hyperlipidemia, unspecified: Secondary | ICD-10-CM

## 2022-02-04 DIAGNOSIS — Z0000 Encounter for general adult medical examination without abnormal findings: Secondary | ICD-10-CM

## 2022-02-04 DIAGNOSIS — E119 Type 2 diabetes mellitus without complications: Secondary | ICD-10-CM | POA: Diagnosis not present

## 2022-02-04 DIAGNOSIS — E1159 Type 2 diabetes mellitus with other circulatory complications: Secondary | ICD-10-CM | POA: Diagnosis not present

## 2022-02-04 DIAGNOSIS — I152 Hypertension secondary to endocrine disorders: Secondary | ICD-10-CM

## 2022-02-04 DIAGNOSIS — E1169 Type 2 diabetes mellitus with other specified complication: Secondary | ICD-10-CM

## 2022-02-04 LAB — HM DIABETES FOOT EXAM

## 2022-02-04 NOTE — Progress Notes (Signed)
Patient ID: Paul Flowers, male    DOB: [birth year], 51 y.o.   MRN: [id number]  This visit was conducted in person.  Pulse 87   Temp 98 F (36.7 C) (Oral)   Ht 5' 9.5" (1.765 m)   Wt 180 lb 8 oz (81.9 kg)   SpO2 97%   BMI 26.27 kg/m    CC:  Chief Complaint  Patient presents with   Annual Exam    Subjective:   HPI: Paul Flowers is a 51 y.o. male presenting on 02/04/2022 for Annual Exam The patient presents for complete physical and review of chronic health problems. He/She also has the following acute concerns today: low back pain  Saw Tabitha Dugal 01/22/2022  On ibuprofen 800 mg TID, tizanidine prn.  Now started PT once so far  Diabetes:   Chronic, stable control followed by endocrinology ( Dr. Dwyane Dee) on [facility] Lab Results  Component Value Date   HGBA1C 6.3 12/14/2021  Using medications without difficulties: Hypoglycemic episodes: Hyperglycemic episodes: Feet problems: no ulcer Blood Sugars averaging: eye exam within last year:  Elevated Cholesterol: LDl at goal < 70 given atherosclerosis of aorta Atorvastatin 20 mg p.o. daily  Lab Results  Component Value Date   CHOL 139 01/28/2022   HDL 64.40 01/28/2022   LDLCALC 60 01/28/2022   LDLDIRECT 149.3 02/12/2013   TRIG 71.0 01/28/2022   CHOLHDL 2 01/28/2022  Using medications without problems: Muscle aches:  Diet compliance: heart healthy diet Exercise: regular.. walking several days a week Other complaints:  Hypertension:  Blood pressure well controlled on losartan 25 mg p.o. daily  BP Readings from Last 3 Encounters:  02/04/22 110/64  01/21/22 128/60  12/17/21 130/82  Using medication without problems or lightheadedness:  none Chest pain with exertion: none Edema:none Short of breath: none Average home BPs: Other issues:     Relevant past medical, surgical, family and social history reviewed and updated as indicated. Interim medical history since our last visit reviewed. Allergies and medications  reviewed and updated. Outpatient Medications Prior to Visit  Medication Sig Dispense Refill   atorvastatin (LIPITOR) 20 MG tablet TAKE 1 TABLET BY MOUTH EVERY DAY 90 tablet 0   ibuprofen (ADVIL) 800 MG tablet Take 1 tablet (800 mg total) by mouth every 8 (eight) hours as needed. 30 tablet 0   losartan (COZAAR) 25 MG tablet TAKE 1 TABLET BY MOUTH EVERY DAY 90 tablet 0   mometasone (NASONEX) 50 MCG/ACT nasal spray PLACE 2 SPRAYS INTO THE NOSE DAILY AS NEEDED (CONGESTION). 51 g 3   montelukast (SINGULAIR) 10 MG tablet TAKE 1 TABLET BY MOUTH EVERYDAY AT BEDTIME 90 tablet 0   Multiple Vitamin (MULTIVITAMIN WITH MINERALS) TABS tablet Take 1 tablet by mouth daily.     omeprazole (PRILOSEC) 20 MG capsule TAKE 1 CAPSULE BY MOUTH EVERY DAY 90 capsule 0   ONE TOUCH LANCETS MISC Use to test blood sugar once daily 200 each 2   tiZANidine (ZANAFLEX) 2 MG tablet Take one po qhs prn muscle spasm 30 tablet 0   XIGDUO XR 11-998 MG TB24 TAKE 1 TABLET BY MOUTH EVERY DAY 30 tablet 3   No facility-administered medications prior to visit.     Per HPI unless specifically indicated in ROS section below Review of Systems  Constitutional:  Negative for fatigue and fever.  HENT:  Negative for ear pain.   Eyes:  Negative for pain.  Respiratory:  Negative for cough and shortness of breath.   Cardiovascular:  Negative for chest pain, palpitations and leg swelling.  Gastrointestinal:  Negative for abdominal pain.  Genitourinary:  Negative for dysuria.  Musculoskeletal:  Negative for arthralgias.  Neurological:  Negative for syncope, light-headedness and headaches.  Psychiatric/Behavioral:  Negative for dysphoric mood.    Objective:  Pulse 87   Temp 98 F (36.7 C) (Oral)   Ht 5' 9.5" (1.765 m)   Wt 180 lb 8 oz (81.9 kg)   SpO2 97%   BMI 26.27 kg/m   Wt Readings from Last 3 Encounters:  02/04/22 180 lb 8 oz (81.9 kg)  01/21/22 179 lb 8 oz (81.4 kg)  12/17/21 179 lb 6.4 oz (81.4 kg)      Physical  Exam Constitutional:      General: He is not in acute distress.    Appearance: Normal appearance. He is well-developed. He is not ill-appearing or toxic-appearing.  HENT:     Head: Normocephalic and atraumatic.     Right Ear: Hearing, tympanic membrane, ear canal and external ear normal.     Left Ear: Hearing, tympanic membrane, ear canal and external ear normal.     Nose: Nose normal.     Mouth/Throat:     Pharynx: Uvula midline.  Eyes:     General: Lids are normal. Lids are everted, no foreign bodies appreciated.     Conjunctiva/sclera: Conjunctivae normal.     Pupils: Pupils are equal, round, and reactive to light.  Neck:     Thyroid: No thyroid mass or thyromegaly.     Vascular: No carotid bruit.     Trachea: Trachea and phonation normal.  Cardiovascular:     Rate and Rhythm: Normal rate and regular rhythm.     Pulses: Normal pulses.     Heart sounds: S1 normal and S2 normal. No murmur heard.    No gallop.  Pulmonary:     Breath sounds: Normal breath sounds. No wheezing, rhonchi or rales.  Abdominal:     General: Bowel sounds are normal.     Palpations: Abdomen is soft.     Tenderness: There is no abdominal tenderness. There is no guarding or rebound.     Hernia: No hernia is present.  Musculoskeletal:     Cervical back: Normal range of motion and neck supple.  Lymphadenopathy:     Cervical: No cervical adenopathy.  Skin:    General: Skin is warm and dry.     Findings: No rash.  Neurological:     Mental Status: He is alert.     Cranial Nerves: No cranial nerve deficit.     Sensory: No sensory deficit.     Gait: Gait normal.     Deep Tendon Reflexes: Reflexes are normal and symmetric.  Psychiatric:        Speech: Speech normal.        Behavior: Behavior normal.        Judgment: Judgment normal.     Diabetic foot exam: Normal inspection No skin breakdown No calluses  Normal DP pulses Normal sensation to light touch and monofilament Nails normal      Results for orders placed or performed in visit on 01/28/22  Lipid panel  Result Value Ref Range   Cholesterol 139 0 - 200 mg/dL   Triglycerides 71.0 0.0 - 149.0 mg/dL   HDL 64.40 >39.00 mg/dL   VLDL 14.2 0.0 - 40.0 mg/dL   LDL Cholesterol 60 0 - 99 mg/dL   Total CHOL/HDL Ratio 2    NonHDL 74.57 

Return ONLY the replacements list. If there are no patient-age errors, return an empty list. for Annual Exam The patient presents for complete physical and review of chronic health problems. He/She also has the following acute concerns today: low back pain  Saw Tabitha Dugal 01/22/2022  On ibuprofen 800 mg TID, tizanidine prn.  Now started PT once so far  Diabetes:   Chronic, stable control followed by endocrinology ( Dr. Dwyane Dee) on [facility] Lab Results  Component Value Date   HGBA1C 6.3 12/14/2021  Using medications without difficulties: Hypoglycemic episodes: Hyperglycemic episodes: Feet problems: no ulcer Blood Sugars averaging: eye exam within last year:  Elevated Cholesterol: LDl at goal < 70 given atherosclerosis of aorta Atorvastatin 20 mg p.o. daily  Lab Results  Component Value Date   CHOL 139 01/28/2022   HDL 64.40 01/28/2022   LDLCALC 60 01/28/2022   LDLDIRECT 149.3 02/12/2013   TRIG 71.0 01/28/2022   CHOLHDL 2 01/28/2022  Using medications without problems: Muscle aches:  Diet compliance: heart healthy diet Exercise: regular.. walking several days a week Other complaints:  Hypertension:  Blood pressure well controlled on losartan 25 mg p.o. daily  BP Readings from Last 3 Encounters:  02/04/22 110/64  01/21/22 128/60  12/17/21 130/82  Using medication without problems or lightheadedness:  none Chest pain with exertion: none Edema:none Short of breath: none Average home BPs: Other issues:     Relevant past medical, surgical, family and social history reviewed and updated as indicated. Interim medical history since our last visit reviewed. Allergies and medications  reviewed and updated. Outpatient Medications Prior to Visit  Medication Sig Dispense Refill   atorvastatin (LIPITOR) 20 MG tablet TAKE 1 TABLET BY MOUTH EVERY DAY 90 tablet 0   ibuprofen (ADVIL) 800 MG tablet Take 1 tablet (800 mg total) by mouth every 8 (eight) hours as needed. 30 tablet 0   losartan (COZAAR) 25 MG tablet TAKE 1 TABLET BY MOUTH EVERY DAY 90 tablet 0   mometasone (NASONEX) 50 MCG/ACT nasal spray PLACE 2 SPRAYS INTO THE NOSE DAILY AS NEEDED (CONGESTION). 51 g 3   montelukast (SINGULAIR) 10 MG tablet TAKE 1 TABLET BY MOUTH EVERYDAY AT BEDTIME 90 tablet 0   Multiple Vitamin (MULTIVITAMIN WITH MINERALS) TABS tablet Take 1 tablet by mouth daily.     omeprazole (PRILOSEC) 20 MG capsule TAKE 1 CAPSULE BY MOUTH EVERY DAY 90 capsule 0   ONE TOUCH LANCETS MISC Use to test blood sugar once daily 200 each 2   tiZANidine (ZANAFLEX) 2 MG tablet Take one po qhs prn muscle spasm 30 tablet 0   XIGDUO XR 11-998 MG TB24 TAKE 1 TABLET BY MOUTH EVERY DAY 30 tablet 3   No facility-administered medications prior to visit.     Per HPI unless specifically indicated in ROS section below Review of Systems  Constitutional:  Negative for fatigue and fever.  HENT:  Negative for ear pain.   Eyes:  Negative for pain.  Respiratory:  Negative for cough and shortness of breath.   Cardiovascular:  Negative for chest pain, palpitations and leg swelling.  Gastrointestinal:  Negative for abdominal pain.  Genitourinary:  Negative for dysuria.  Musculoskeletal:  Negative for arthralgias.  Neurological:  Negative for syncope, light-headedness and headaches.  Psychiatric/Behavioral:  Negative for dysphoric mood.    Objective:  Pulse 87   Temp 98 F (36.7 C) (Oral)   Ht 5' 9.5" (1.765 m)   Wt 180 lb 8 oz (81.9 kg)   SpO2 97%   BMI 26.27 kg/m   Wt Readings from Last 3 Encounters:  02/04/22 180 lb 8 oz (81.9 kg)  01/21/22 179 lb 8 oz (81.4 kg)  12/17/21 179 lb 6.4 oz (81.4 kg)      Physical  Exam Constitutional:      General: He is not in acute distress.    Appearance: Normal appearance. He is well-developed. He is not ill-appearing or toxic-appearing.  HENT:     Head: Normocephalic and atraumatic.     Right Ear: Hearing, tympanic membrane, ear canal and external ear normal.     Left Ear: Hearing, tympanic membrane, ear canal and external ear normal.     Nose: Nose normal.     Mouth/Throat:     Pharynx: Uvula midline.  Eyes:     General: Lids are normal. Lids are everted, no foreign bodies appreciated.     Conjunctiva/sclera: Conjunctivae normal.     Pupils: Pupils are equal, round, and reactive to light.  Neck:     Thyroid: No thyroid mass or thyromegaly.     Vascular: No carotid bruit.     Trachea: Trachea and phonation normal.  Cardiovascular:     Rate and Rhythm: Normal rate and regular rhythm.     Pulses: Normal pulses.     Heart sounds: S1 normal and S2 normal. No murmur heard.    No gallop.  Pulmonary:     Breath sounds: Normal breath sounds. No wheezing, rhonchi or rales.  Abdominal:     General: Bowel sounds are normal.     Palpations: Abdomen is soft.     Tenderness: There is no abdominal tenderness. There is no guarding or rebound.     Hernia: No hernia is present.  Musculoskeletal:     Cervical back: Normal range of motion and neck supple.  Lymphadenopathy:     Cervical: No cervical adenopathy.  Skin:    General: Skin is warm and dry.     Findings: No rash.  Neurological:     Mental Status: He is alert.     Cranial Nerves: No cranial nerve deficit.     Sensory: No sensory deficit.     Gait: Gait normal.     Deep Tendon Reflexes: Reflexes are normal and symmetric.  Psychiatric:        Speech: Speech normal.        Behavior: Behavior normal.        Judgment: Judgment normal.     Diabetic foot exam: Normal inspection No skin breakdown No calluses  Normal DP pulses Normal sensation to light touch and monofilament Nails normal      Results for orders placed or performed in visit on 01/28/22  Lipid panel  Result Value Ref Range   Cholesterol 139 0 - 200 mg/dL   Triglycerides 71.0 0.0 - 149.0 mg/dL   HDL 64.40 >39.00 mg/dL   VLDL 14.2 0.0 - 40.0 mg/dL   LDL Cholesterol 60 0 - 99 mg/dL   Total CHOL/HDL Ratio 2    NonHDL 74.57  Hepatic function panel  Result Value Ref Range   Total Bilirubin 0.8 0.2 - 1.2 mg/dL   Bilirubin, Direct 0.2 0.0 - 0.3 mg/dL   Alkaline Phosphatase 66 39 - 117 U/L   AST 15 0 - 37 U/L   ALT 16 0 - 53 U/L   Total Protein 7.0 6.0 - 8.3 g/dL   Albumin 4.2 3.5 - 5.2 g/dL  CBC with Differential/Platelet  Result Value Ref Range   WBC 6.9 4.0 - 10.5 K/uL   RBC 4.88 4.22 - 5.81 Mil/uL   Hemoglobin 14.2 13.0 - 17.0 g/dL   HCT 42.4 39.0 - 52.0 %   MCV 86.8 78.0 - 100.0 fl   MCHC 33.5 30.0 - 36.0 g/dL   RDW 14.0 11.5 - 15.5 %   Platelets 291.0 150.0 - 400.0 K/uL   Neutrophils Relative % 56.0 43.0 - 77.0 %   Lymphocytes Relative 28.4 12.0 - 46.0 %   Monocytes Relative 10.8 3.0 - 12.0 %   Eosinophils Relative 3.5 0.0 - 5.0 %   Basophils Relative 1.3 0.0 - 3.0 %   Neutro Abs 3.9 1.4 - 7.7 K/uL   Lymphs Abs 2.0 0.7 - 4.0 K/uL   Monocytes Absolute 0.7 0.1 - 1.0 K/uL   Eosinophils Absolute 0.2 0.0 - 0.7 K/uL   Basophils Absolute 0.1 0.0 - 0.1 K/uL  PSA  Result Value Ref Range   PSA 0.34 0.10 - 4.00 ng/mL     COVID 19 screen:  No recent travel or known exposure to COVID19 The patient denies respiratory symptoms of COVID 19 at this time. The importance of social distancing was discussed today.   Assessment and Plan The patient's preventative maintenance and recommended screening tests for an annual wellness exam were reviewed in full today. Brought up to date unless services declined.  Counselled on the importance of diet, exercise, and its role in overall health and mortality. The patient's FH and SH was reviewed, including their home life, tobacco status, and drug and alcohol status.    Colon cancer screening: 2021, repeat in 3 years HIV screen:   Refused Nonsmoker Vaccines: Uptodate, S/P COVID19, Consider Shingrix vaccine  Prostate cancer screening  Lab Results  Component Value Date   PSA 0.34 01/28/2022   PSA 0.32 01/18/2020   PSA 0.32 01/12/2019  Xigduo.  Problem List Items Addressed This Visit     Controlled type 2 diabetes mellitus without complication, without long-term current use of insulin ([place])    Chronic excellent control followed by Dr. Dwyane Dee endocrinology.  Continue Xigduo.      Hyperlipidemia associated with type 2 diabetes mellitus ([place])     LDl at goal < 70 given atherosclerosis of aorta Atorvastatin 20 mg p.o. daily      Hypertension associated with diabetes ([facility])    Stable, chronic.  Continue current medication.  Losartan 25 mg daily           Other Visit Diagnoses     Routine general medical examination at a health care facility    -  Primary        Eliezer Lofts, MD

## 2022-02-04 NOTE — Telephone Encounter (Signed)
Patient called in states pharmacy is needing PA for xigduo. Old PA has expired according to patient.

## 2022-02-04 NOTE — Assessment & Plan Note (Signed)
Stable, chronic.  Continue current medication.  Losartan 25 mg daily 

## 2022-02-04 NOTE — Assessment & Plan Note (Signed)
LDl at goal < 70 given atherosclerosis of aorta Atorvastatin 20 mg p.o. daily

## 2022-02-04 NOTE — Telephone Encounter (Signed)
Patient Advocate Encounter   Received notification from [facility] that prior authorization is required for Xigduo XR. PA submitted and APPROVED on 02/04/22.  Key: [id number]  Effective: 01/05/2022 - 02/04/2023  Clista Bernhardt, CPhT Rx Patient Advocate Specialist Phone: [phone]

## 2022-02-04 NOTE — Patient Instructions (Addendum)
Consider shingrix vaccines series.  Continue PT.. follow up back pain is not improving as expected.

## 2022-02-04 NOTE — Assessment & Plan Note (Signed)
Chronic excellent control followed by Dr. Dwyane Dee endocrinology.  Continue Xigduo.

## 2022-02-05 ENCOUNTER — Other Ambulatory Visit: Payer: Self-pay

## 2022-02-05 MED ORDER — XIGDUO XR 5-1000 MG PO TB24: 1.0000 | ORAL_TABLET | Freq: Every day | ORAL | 3 refills | Status: DC

## 2022-02-05 NOTE — Telephone Encounter (Signed)
Called left detail vm for patient

## 2022-02-10 ENCOUNTER — Ambulatory Visit: Payer: Federal, State, Local not specified - PPO

## 2022-02-10 ENCOUNTER — Ambulatory Visit: Payer: Federal, State, Local not specified - PPO | Attending: Family Medicine

## 2022-02-10 DIAGNOSIS — M5459 Other low back pain: Secondary | ICD-10-CM | POA: Insufficient documentation

## 2022-02-10 DIAGNOSIS — R293 Abnormal posture: Secondary | ICD-10-CM | POA: Diagnosis not present

## 2022-02-10 DIAGNOSIS — M6281 Muscle weakness (generalized): Secondary | ICD-10-CM | POA: Diagnosis not present
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
## 2022-02-10 NOTE — Patient Instructions (Signed)

## 2022-02-12 ENCOUNTER — Other Ambulatory Visit: Payer: Self-pay | Admitting: Family

## 2022-02-12 ENCOUNTER — Ambulatory Visit: Payer: Federal, State, Local not specified - PPO

## 2022-02-12 DIAGNOSIS — M62838 Other muscle spasm: Secondary | ICD-10-CM

## 2022-02-12 DIAGNOSIS — M6281 Muscle weakness (generalized): Secondary | ICD-10-CM | POA: Diagnosis not present

## 2022-02-12 DIAGNOSIS — M5459 Other low back pain: Secondary | ICD-10-CM

## 2022-02-12 DIAGNOSIS — R293 Abnormal posture: Secondary | ICD-10-CM
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
## 2022-02-15 ENCOUNTER — Ambulatory Visit: Payer: Federal, State, Local not specified - PPO

## 2022-02-15 DIAGNOSIS — R293 Abnormal posture: Secondary | ICD-10-CM

## 2022-02-15 DIAGNOSIS — M6281 Muscle weakness (generalized): Secondary | ICD-10-CM | POA: Diagnosis not present

## 2022-02-15 DIAGNOSIS — M5459 Other low back pain: Secondary | ICD-10-CM | POA: Diagnosis not present
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
## 2022-02-17 ENCOUNTER — Ambulatory Visit: Payer: Federal, State, Local not specified - PPO

## 2022-02-17 DIAGNOSIS — R293 Abnormal posture: Secondary | ICD-10-CM

## 2022-02-17 DIAGNOSIS — M6281 Muscle weakness (generalized): Secondary | ICD-10-CM | POA: Diagnosis not present

## 2022-02-17 DIAGNOSIS — M5459 Other low back pain: Secondary | ICD-10-CM
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
## 2022-02-18 ENCOUNTER — Other Ambulatory Visit: Payer: Self-pay | Admitting: Family Medicine

## 2022-02-23 ENCOUNTER — Ambulatory Visit: Payer: Federal, State, Local not specified - PPO

## 2022-02-23 DIAGNOSIS — R293 Abnormal posture: Secondary | ICD-10-CM | POA: Diagnosis not present

## 2022-02-23 DIAGNOSIS — M6281 Muscle weakness (generalized): Secondary | ICD-10-CM | POA: Diagnosis not present

## 2022-02-23 DIAGNOSIS — M5459 Other low back pain: Secondary | ICD-10-CM
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
## 2022-02-25 ENCOUNTER — Ambulatory Visit: Payer: Federal, State, Local not specified - PPO

## 2022-02-25 DIAGNOSIS — M6281 Muscle weakness (generalized): Secondary | ICD-10-CM

## 2022-02-25 DIAGNOSIS — R293 Abnormal posture: Secondary | ICD-10-CM | POA: Diagnosis not present

## 2022-02-25 DIAGNOSIS — M5459 Other low back pain: Secondary | ICD-10-CM
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
## 2022-02-27 ENCOUNTER — Other Ambulatory Visit: Payer: Self-pay | Admitting: Family Medicine

## 2022-03-01 ENCOUNTER — Ambulatory Visit: Payer: Federal, State, Local not specified - PPO

## 2022-03-03 ENCOUNTER — Ambulatory Visit: Payer: Federal, State, Local not specified - PPO

## 2022-03-03 DIAGNOSIS — M6281 Muscle weakness (generalized): Secondary | ICD-10-CM

## 2022-03-03 DIAGNOSIS — M5459 Other low back pain: Secondary | ICD-10-CM | POA: Diagnosis not present

## 2022-03-03 DIAGNOSIS — R293 Abnormal posture: Secondary | ICD-10-CM | POA: Diagnosis not present
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
## 2022-03-12 ENCOUNTER — Other Ambulatory Visit: Payer: Self-pay | Admitting: Family Medicine

## 2022-03-17 ENCOUNTER — Other Ambulatory Visit: Payer: Self-pay | Admitting: Family Medicine

## 2022-03-17 DIAGNOSIS — G8929 Other chronic pain: Secondary | ICD-10-CM

## 2022-03-17 DIAGNOSIS — M62838 Other muscle spasm: Secondary | ICD-10-CM

## 2022-03-17 MED ORDER — IBUPROFEN 800 MG PO TABS: 800.0000 mg | ORAL_TABLET | Freq: Three times a day (TID) | ORAL | 0 refills | Status: DC | PRN

## 2022-03-17 NOTE — Telephone Encounter (Signed)
  Encourage patient to contact the pharmacy for refills or they can request refills through [place]:  Please schedule appointment if longer than 1 year  NEXT APPOINTMENT DATE:  MEDICATION:ibuprofen (ADVIL) 800 MG tablet   Is the patient out of medication?   PHARMACY: [id number] [place], [place] - [id number]  Let patient know to contact pharmacy at the end of the day to make sure medication is ready.  Please notify patient to allow 48-72 hours to process  CLINICAL FILLS OUT ALL BELOW:   LAST REFILL:  QTY:  REFILL DATE:    OTHER COMMENTS:    Okay for refill?  Please advise

## 2022-03-17 NOTE — Telephone Encounter (Signed)
Last office visit 02/04/22 for CPE.  Last refilled 01/21/22 for #30 with no refills.  No future appointments with PCP.

## 2022-05-09 ENCOUNTER — Other Ambulatory Visit: Payer: Self-pay | Admitting: Family Medicine

## 2022-05-09 DIAGNOSIS — J0110 Acute frontal sinusitis, unspecified: Secondary | ICD-10-CM | POA: Diagnosis not present

## 2022-06-11 ENCOUNTER — Other Ambulatory Visit: Payer: Self-pay | Admitting: Endocrinology

## 2022-06-15 ENCOUNTER — Other Ambulatory Visit: Payer: Federal, State, Local not specified - PPO

## 2022-06-18 ENCOUNTER — Ambulatory Visit: Payer: Federal, State, Local not specified - PPO | Admitting: Endocrinology

## 2022-06-25 ENCOUNTER — Other Ambulatory Visit: Payer: Self-pay | Admitting: Family Medicine

## 2022-06-25 ENCOUNTER — Other Ambulatory Visit: Payer: Self-pay | Admitting: Family

## 2022-06-25 DIAGNOSIS — M62838 Other muscle spasm: Secondary | ICD-10-CM

## 2022-06-25 DIAGNOSIS — G8929 Other chronic pain: Secondary | ICD-10-CM

## 2022-06-25 NOTE — Telephone Encounter (Signed)
Last office visit 02/04/22 for CPE.  Last refilled 03/17/22 for #30 with no refills.  No future appointments with PCP.

## 2022-07-17 ENCOUNTER — Other Ambulatory Visit: Payer: Self-pay | Admitting: Family Medicine

## 2022-07-17 DIAGNOSIS — M62838 Other muscle spasm: Secondary | ICD-10-CM

## 2022-07-18 NOTE — Telephone Encounter (Signed)
Last office visit 02/04/22 for CPE.  Last refilled 06/25/22 for #30 with no refills. Pharmacy is asking for 90 day supply.  Ok to refill?  Next Appt: 07/29/22 with Cable for cough.

## 2022-07-19 ENCOUNTER — Encounter: Payer: Self-pay | Admitting: Nurse Practitioner

## 2022-07-19 ENCOUNTER — Ambulatory Visit (INDEPENDENT_AMBULATORY_CARE_PROVIDER_SITE_OTHER)
Admission: RE | Admit: 2022-07-19 | Discharge: 2022-07-19 | Disposition: A | Discharge status: Home / Self Care | Payer: Federal, State, Local not specified - PPO | Source: Ambulatory Visit | Attending: Nurse Practitioner | Admitting: Nurse Practitioner

## 2022-07-19 ENCOUNTER — Ambulatory Visit: Payer: Federal, State, Local not specified - PPO | Admitting: Nurse Practitioner

## 2022-07-19 VITALS — BP 132/82 | HR 96 | Ht 69.5 in | Wt 178.0 lb

## 2022-07-19 DIAGNOSIS — R052 Subacute cough: Secondary | ICD-10-CM

## 2022-07-19 DIAGNOSIS — R059 Cough, unspecified: Secondary | ICD-10-CM | POA: Diagnosis not present

## 2022-07-19 MED ORDER — LEVOCETIRIZINE DIHYDROCHLORIDE 5 MG PO TABS: 5.0000 mg | ORAL_TABLET | Freq: Every evening | ORAL | 0 refills | Status: DC

## 2022-07-19 NOTE — Assessment & Plan Note (Signed)
Like residual from sinusitis. Start using the nasonex. Add on xyzal. Pending chest xray

## 2022-07-19 NOTE — Progress Notes (Signed)
   Acute Office Visit  Subjective:     Patient ID: Paul Flowers, male    DOB: [birth year], 52 y.o.   MRN: [id number]  Chief Complaint  Patient presents with   Cough    For several weeks, has tried OTC meds, not helping; denies fever/chills/shob     Patient is in today for cough with history of DM, OSA, hypertension, renal disorder  Patient was seen on 05/09/2022 at urgent care for acute frontal sinusitis.  He was given Augmentin 875-125 1 tablet twice daily for 7 days.  He is here because he continues to have a cough States that when he got the augmentin it seems to clear it up. States that he ahs a cough that does not seem to go away. It is no phelgm. States that some days he does not have issue with the cough and then some days it is worse. Worse when colder and at night  States that he has used a tussin syrup, theraflu, Alka sletzer plus cold medicine. Has tried sudafed  all without great relief   Review of Systems  Constitutional:  Negative for chills, fever and malaise/fatigue.  HENT:  Negative for ear discharge, ear pain, sinus pain and sore throat.   Respiratory:  Positive for cough. Negative for sputum production and shortness of breath (could not get a good breath in a few weeks ago. Baseline now).   Cardiovascular:  Negative for chest pain.  Musculoskeletal:  Negative for back pain, joint pain and myalgias.  Neurological:  Negative for headaches.        Objective:    BP 132/82   Pulse 96   Ht 5' 9.5" (1.765 m)   Wt 178 lb (80.7 kg)   SpO2 96%   BMI 25.91 kg/m    Physical Exam Vitals and nursing note reviewed.  Constitutional:      Appearance: Normal appearance.  HENT:     Right Ear: Tympanic membrane, ear canal and external ear normal.     Left Ear: Tympanic membrane, ear canal and external ear normal.     Mouth/Throat:     Mouth: Mucous membranes are moist.     Pharynx: Oropharynx is clear.  Cardiovascular:     Rate and Rhythm: Normal rate and regular  rhythm.     Heart sounds: Normal heart sounds.  Pulmonary:     Effort: Pulmonary effort is normal.     Breath sounds: Normal breath sounds.  Neurological:     Mental Status: He is alert.     No results found for any visits on 07/19/22.      Assessment & Plan:   Problem List Items Addressed This Visit       Other   Subacute cough - Primary    Like residual from sinusitis. Start using the nasonex. Add on xyzal. Pending chest xray       Relevant Medications   levocetirizine (XYZAL) 5 MG tablet   Other Relevant Orders   DG Chest 2 View    Meds ordered this encounter  Medications   levocetirizine (XYZAL) 5 MG tablet    Sig: Take 1 tablet (5 mg total) by mouth every evening.    Dispense:  30 tablet    Refill:  0    Order Specific Question:   Supervising Provider    Answer:   TOWER, MARNE A [[id number]]    Return if symptoms worsen or fail to improve.  Romilda Garret, NP

## 2022-07-19 NOTE — Patient Instructions (Signed)
Nice to see you today Start taking the Nasonex spray for at least the next week I have sent in an allergy pill to take I will be in touch with the chest xray results once I have them Follow up if no improvement

## 2022-07-20 ENCOUNTER — Encounter: Payer: Self-pay | Admitting: Nurse Practitioner

## 2022-07-23 ENCOUNTER — Other Ambulatory Visit (INDEPENDENT_AMBULATORY_CARE_PROVIDER_SITE_OTHER): Payer: Federal, State, Local not specified - PPO

## 2022-07-23 DIAGNOSIS — E119 Type 2 diabetes mellitus without complications: Secondary | ICD-10-CM | POA: Diagnosis not present

## 2022-07-23 LAB — BASIC METABOLIC PANEL
BUN: 7 mg/dL (ref 6–23)
CO2: 28 mEq/L (ref 19–32)
Calcium: 8.9 mg/dL (ref 8.4–10.5)
Chloride: 102 mEq/L (ref 96–112)
Creatinine, Ser: 0.7 mg/dL (ref 0.40–1.50)
GFR: 105.25 mL/min (ref >60.00)
Glucose, Bld: 126 mg/dL — ABNORMAL HIGH (ref 70–99)
Potassium: 3.4 mEq/L — ABNORMAL LOW (ref 3.5–5.1)
Sodium: 141 mEq/L (ref 135–145)

## 2022-07-23 LAB — HEMOGLOBIN A1C: Hgb A1c MFr Bld: 7 % — ABNORMAL HIGH (ref 4.6–6.5)

## 2022-07-26 ENCOUNTER — Other Ambulatory Visit: Payer: Federal, State, Local not specified - PPO

## 2022-08-03 ENCOUNTER — Encounter: Payer: Self-pay | Admitting: Endocrinology

## 2022-08-03 ENCOUNTER — Ambulatory Visit: Payer: Federal, State, Local not specified - PPO | Admitting: Endocrinology

## 2022-08-03 VITALS — BP 144/98 | HR 91 | Ht 69.5 in | Wt 181.0 lb

## 2022-08-03 DIAGNOSIS — I10 Essential (primary) hypertension: Secondary | ICD-10-CM

## 2022-08-03 DIAGNOSIS — E1165 Type 2 diabetes mellitus with hyperglycemia: Secondary | ICD-10-CM

## 2022-08-03 NOTE — Patient Instructions (Signed)
Check blood sugars on waking up 2 days a week  Also check blood sugars about 2 hours after meals and do this after different meals by rotation  Recommended blood sugar levels on waking up are 90-130 and about 2 hours after meal is 130-160  Please bring your blood sugar monitor to each visit, thank you  Check BP
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
## 2022-08-10 ENCOUNTER — Other Ambulatory Visit: Payer: Self-pay | Admitting: Nurse Practitioner

## 2022-08-10 DIAGNOSIS — R052 Subacute cough: Secondary | ICD-10-CM

## 2022-08-22 ENCOUNTER — Other Ambulatory Visit: Payer: Self-pay | Admitting: Nurse Practitioner

## 2022-08-22 DIAGNOSIS — R052 Subacute cough: Secondary | ICD-10-CM

## 2022-08-24 NOTE — Telephone Encounter (Signed)
Called patient was not covered by his insurance. He just started something over the counter. Does not need refilled.

## 2022-08-27 ENCOUNTER — Other Ambulatory Visit: Payer: Self-pay | Admitting: Family Medicine

## 2022-09-19 ENCOUNTER — Other Ambulatory Visit: Payer: Self-pay | Admitting: Nurse Practitioner

## 2022-09-19 DIAGNOSIS — R052 Subacute cough: Secondary | ICD-10-CM

## 2022-09-28 ENCOUNTER — Encounter: Payer: Self-pay | Admitting: Family Medicine

## 2022-09-28 ENCOUNTER — Ambulatory Visit: Payer: Federal, State, Local not specified - PPO | Admitting: Family Medicine

## 2022-09-28 VITALS — BP 120/78 | HR 107 | Temp 97.9°F | Ht 69.5 in | Wt 177.4 lb

## 2022-09-28 DIAGNOSIS — R053 Chronic cough: Secondary | ICD-10-CM

## 2022-09-28 DIAGNOSIS — R000 Tachycardia, unspecified: Secondary | ICD-10-CM

## 2022-09-28 MED ORDER — PREDNISONE 20 MG PO TABS: ORAL_TABLET | ORAL | 0 refills | Status: DC

## 2022-09-28 MED ORDER — ALBUTEROL SULFATE HFA 108 (90 BASE) MCG/ACT IN AERS: 2.0000 | INHALATION_SPRAY | Freq: Four times a day (QID) | RESPIRATORY_TRACT | 0 refills | Status: DC | PRN

## 2022-09-28 NOTE — Assessment & Plan Note (Signed)
Chronic, appears most consistent with postinfectious cough but has persisting now for 5 months.  He states allergies are well-controlled on current regimen.  Possible new asthma.  States reflux well-controlled on omeprazole 20 mg daily. Non-smoker Normal exam. Will treat with a trial of prednisone for possible bronchospasm.  If not improving will consider pulmonary lung function test pre and post albuterol.

## 2022-09-28 NOTE — Patient Instructions (Addendum)
Please stop at the lab to have labs drawn to check labs for anemia and thyroid given elevated heart rate.  Persistent cough likely secondary to postinfectious cough... will treat with prednisone taper.  If not improving.. will consider lung function tests.  Continue allergy medications.  See attached for symptoms  for prostate enlargement.

## 2022-09-28 NOTE — Progress Notes (Signed)
Patient ID: Paul Flowers, male    DOB: 1970-09-19, 52 y.o.   MRN: [id number]  This visit was conducted in person.  BP 120/78   Pulse (!) 104   Temp 97.9 F (36.6 C) (Temporal)   Ht 5' 9.5" (1.765 m)   Wt 177 lb 6 oz (80.5 kg)   SpO2 98%   BMI 25.82 kg/m    CC:  Chief Complaint  Patient presents with   Cough    Started back in November-Just won't go away    Subjective:   HPI: Paul Flowers is a 52 y.o. male pr with diabetes presenting on 09/28/2022 for Cough (Started back in [place] won't go away)  He has noted persistent cough for last 5 months.  Started  after initial respiratory infection, Negative for COVID, treated with amox.  Most at night, but also coughing fits at night.  Nonproductive, no fever,  no sneeze, no ithcy eyes no post nasal drip.  Has tried delsym.  Reviewed OV note from [facility] 07/19/2022 felt likely residual from sinusitis.Marland Kitchen recommended nasonex, Xyzal and CXR evaluated:  No cardiopulmonary disease.  On Xyzal , Nasonex and singlair for allergies. Nonsmoker   On  omeprazole 20 mg daily feels GERD    No SOB    No heart racing but elevated in office today.  Relevant past medical, surgical, family and social history reviewed and updated as indicated. Interim medical history since our last visit reviewed. Allergies and medications reviewed and updated. Outpatient Medications Prior to Visit  Medication Sig Dispense Refill   atorvastatin (LIPITOR) 20 MG tablet TAKE 1 TABLET BY MOUTH EVERY DAY 90 tablet 3   ibuprofen (ADVIL) 800 MG tablet TAKE 1 TABLET BY MOUTH EVERY 8 HOURS AS NEEDED 30 tablet 0   levocetirizine (XYZAL) 5 MG tablet Take 1 tablet (5 mg total) by mouth every evening. 30 tablet 0   losartan (COZAAR) 25 MG tablet TAKE 1 TABLET BY MOUTH EVERY DAY 90 tablet 3   mometasone (NASONEX) 50 MCG/ACT nasal spray INSTILL 2 SPRAYS IN NOSTRILS DAILY AS NEEDED FOR CONGESTION 51 each 3   montelukast (SINGULAIR) 10 MG tablet TAKE 1 TABLET BY  MOUTH EVERYDAY AT BEDTIME 90 tablet 3   Multiple Vitamin (MULTIVITAMIN WITH MINERALS) TABS tablet Take 1 tablet by mouth daily.     omeprazole (PRILOSEC) 20 MG capsule TAKE 1 CAPSULE BY MOUTH EVERY DAY 90 capsule 1   ONE TOUCH LANCETS MISC Use to test blood sugar once daily 200 each 2   tiZANidine (ZANAFLEX) 2 MG tablet TAKE 1 TABLET BY MOUTH AT BEDTIME AS NEEDED FOR MUSCLE SPASM 90 tablet 0   XIGDUO XR 11-998 MG TB24 TAKE 1 TABLET BY MOUTH EVERY DAY 90 tablet 1   No facility-administered medications prior to visit.     Per HPI unless specifically indicated in ROS section below Review of Systems  Constitutional:  Negative for fatigue and fever.  HENT:  Negative for ear pain.   Eyes:  Negative for pain.  Respiratory:  Positive for cough. Negative for shortness of breath.   Cardiovascular:  Negative for chest pain, palpitations and leg swelling.  Gastrointestinal:  Negative for abdominal pain.  Genitourinary:  Negative for dysuria.  Musculoskeletal:  Negative for arthralgias.  Neurological:  Negative for syncope, light-headedness and headaches.  Psychiatric/Behavioral:  Negative for dysphoric mood.    Objective:  BP 120/78   Pulse (!) 104   Temp 97.9 F (36.6 C) (Temporal)   Ht 5'  9.5" (1.765 m)   Wt 177 lb 6 oz (80.5 kg)   SpO2 98%   BMI 25.82 kg/m   Wt Readings from Last 3 Encounters:  09/28/22 177 lb 6 oz (80.5 kg)  08/03/22 181 lb (82.1 kg)  07/19/22 178 lb (80.7 kg)      Physical Exam Constitutional:      Appearance: He is well-developed.  HENT:     Head: Normocephalic.     Right Ear: Hearing normal.     Left Ear: Hearing normal.     Nose: Nose normal.  Neck:     Thyroid: No thyroid mass or thyromegaly.     Vascular: No carotid bruit.     Trachea: Trachea normal.  Cardiovascular:     Rate and Rhythm: Normal rate and regular rhythm.     Pulses: Normal pulses.     Heart sounds: Heart sounds not distant. No murmur heard.    No friction rub. No gallop.      Comments: No peripheral edema Pulmonary:     Effort: Pulmonary effort is normal. No respiratory distress.     Breath sounds: Normal breath sounds.  Skin:    General: Skin is warm and dry.     Findings: No rash.  Psychiatric:        Speech: Speech normal.        Behavior: Behavior normal.        Thought Content: Thought content normal.       Results for orders placed or performed in visit on 123XX123  Basic metabolic panel  Result Value Ref Range   Sodium 141 135 - 145 mEq/L   Potassium 3.4 (L) 3.5 - 5.1 mEq/L   Chloride 102 96 - 112 mEq/L   CO2 28 19 - 32 mEq/L   Glucose, Bld 126 (H) 70 - 99 mg/dL   BUN 7 6 - 23 mg/dL   Creatinine, Ser 0.70 0.40 - 1.50 mg/dL   GFR 105.25 >60.00 mL/min   Calcium 8.9 8.4 - 10.5 mg/dL  Hemoglobin A1c  Result Value Ref Range   Hgb A1c MFr Bld 7.0 (H) 4.6 - 6.5 %    Assessment and Plan  There are no diagnoses linked to this encounter.  No follow-ups on file.   Eliezer Lofts, MD

## 2022-09-28 NOTE — Assessment & Plan Note (Signed)
He appears to have chronic tachycardia, may be anxiety related.  Was worse when saw endocrinology recently with heart rate at 138.  Will evaluate thyroid and CBC. Avoid decongestants and decrease caffeine is much as able.

## 2022-09-29 LAB — TSH: TSH: 0.45 u[IU]/mL (ref 0.35–5.50)

## 2022-10-13 ENCOUNTER — Encounter: Payer: Self-pay | Admitting: Family Medicine

## 2022-10-13 NOTE — Telephone Encounter (Signed)
error 

## 2022-10-14 ENCOUNTER — Ambulatory Visit: Payer: Federal, State, Local not specified - PPO | Admitting: Family Medicine

## 2022-10-14 VITALS — BP 120/80 | HR 109 | Temp 97.6°F | Ht 69.5 in | Wt 176.1 lb

## 2022-10-14 DIAGNOSIS — R053 Chronic cough: Secondary | ICD-10-CM

## 2022-10-14 DIAGNOSIS — R350 Frequency of micturition: Secondary | ICD-10-CM | POA: Diagnosis not present

## 2022-10-14 DIAGNOSIS — R5383 Other fatigue: Secondary | ICD-10-CM | POA: Insufficient documentation

## 2022-10-14 LAB — CBC WITH DIFFERENTIAL/PLATELET
Basophils Absolute: 0.1 10*3/uL (ref 0.0–0.1)
Basophils Relative: 1.3 % (ref 0.0–3.0)
Eosinophils Absolute: 0.2 10*3/uL (ref 0.0–0.7)
Eosinophils Relative: 2.5 % (ref 0.0–5.0)
HCT: 36.3 % — ABNORMAL LOW (ref 39.0–52.0)
Hemoglobin: 12.2 g/dL — ABNORMAL LOW (ref 13.0–17.0)
Lymphocytes Relative: 22.4 % (ref 12.0–46.0)
Lymphs Abs: 1.9 10*3/uL (ref 0.7–4.0)
MCHC: 33.7 g/dL (ref 30.0–36.0)
MCV: 80.2 fl (ref 78.0–100.0)
Monocytes Absolute: 0.9 10*3/uL (ref 0.1–1.0)
Monocytes Relative: 10.8 % (ref 3.0–12.0)
Neutro Abs: 5.3 10*3/uL (ref 1.4–7.7)
Neutrophils Relative %: 63 % (ref 43.0–77.0)
Platelets: 359 10*3/uL (ref 150.0–400.0)
RBC: 4.52 Mil/uL (ref 4.22–5.81)
RDW: 14.5 % (ref 11.5–15.5)
WBC: 8.4 10*3/uL (ref 4.0–10.5)

## 2022-10-14 LAB — COMPREHENSIVE METABOLIC PANEL
ALT: 12 U/L (ref 0–53)
AST: 11 U/L (ref 0–37)
Albumin: 3.9 g/dL (ref 3.5–5.2)
Alkaline Phosphatase: 88 U/L (ref 39–117)
BUN: 9 mg/dL (ref 6–23)
CO2: 28 mEq/L (ref 19–32)
Calcium: 9.4 mg/dL (ref 8.4–10.5)
Chloride: 98 mEq/L (ref 96–112)
Creatinine, Ser: 0.72 mg/dL (ref 0.40–1.50)
GFR: 104.2 mL/min (ref >60.00)
Glucose, Bld: 107 mg/dL — ABNORMAL HIGH (ref 70–99)
Potassium: 3.7 mEq/L (ref 3.5–5.1)
Sodium: 138 mEq/L (ref 135–145)
Total Bilirubin: 1.2 mg/dL (ref 0.2–1.2)
Total Protein: 8 g/dL (ref 6.0–8.3)

## 2022-10-14 LAB — HEMOGLOBIN A1C: Hgb A1c MFr Bld: 7.6 % — ABNORMAL HIGH (ref 4.6–6.5)

## 2022-10-14 LAB — POC URINALSYSI DIPSTICK (AUTOMATED)
Bilirubin, UA: NEGATIVE
Blood, UA: NEGATIVE
Glucose, UA: POSITIVE — AB
Ketones, UA: NEGATIVE
Leukocytes, UA: NEGATIVE
Nitrite, UA: NEGATIVE
Protein, UA: POSITIVE — AB
Spec Grav, UA: 1.01 (ref 1.010–1.025)
Urobilinogen, UA: 0.2 E.U./dL
pH, UA: 6 (ref 5.0–8.0)

## 2022-10-14 LAB — VITAMIN B12: Vitamin B-12: 239 pg/mL (ref 211–911)

## 2022-10-14 LAB — VITAMIN D 25 HYDROXY (VIT D DEFICIENCY, FRACTURES): VITD: 28.03 ng/mL — ABNORMAL LOW (ref 30.00–100.00)

## 2022-10-14 MED ORDER — CIPROFLOXACIN HCL 500 MG PO TABS: 500.0000 mg | ORAL_TABLET | Freq: Two times a day (BID) | ORAL | 0 refills | Status: AC

## 2022-10-14 MED ORDER — FLUTICASONE PROPIONATE HFA 110 MCG/ACT IN AERO: 2.0000 | INHALATION_SPRAY | Freq: Two times a day (BID) | RESPIRATORY_TRACT | 12 refills | Status: DC

## 2022-10-14 NOTE — Patient Instructions (Signed)
Please stop at the lab to have labs drawn.   Will treat for likely prostatitis with cipro x 14 days.  Start trial of controller inhaler for allergic/ post infectious cough variant asthma twice daily.   Call if not improving as expected for possible lung function test or urology referral.

## 2022-10-14 NOTE — Progress Notes (Signed)
Patient ID: Paul Flowers, male    DOB: Nov 16, 1970, 52 y.o.   MRN: [id number]  This visit was conducted in person.  BP 120/80   Pulse (!) 109   Temp 97.6 F (36.4 C) (Temporal)   Ht 5' 9.5" (1.765 m)   Wt 176 lb 2 oz (79.9 kg)   SpO2 97%   BMI 25.64 kg/m    CC:  Chief Complaint  Patient presents with   Cough   Urinary Frequency    Subjective:   HPI: Paul Flowers is a 52 y.o. male presenting on 10/14/2022 for Cough and Urinary Frequency  Recent office visit for persistent cough September 28, 2022. At that time ongoing for 5 months.  Started after initial respiratory infection, negative for COVID treated with amoxicillin course given no resolution. July 19, 2022 office visit treated with Nasonex and Xyzal and chest x-ray showed no cardiopulmonary disease.  Treated with a trial of prednisone for possible bronchospasm, this temporarily improved his symptoms  Of note reflux well-controlled on omeprazole 20 mg daily.    Today he states  on prednisone cough resolved entirely. Several days afterward restarted... felt  uncontrollable need to cough.  NO fever,  no sinus pressure nasal congestion... using nasonex.  Using albuterol as needed.. helps calm the calm the cough for a whule.  Triggered by deep breaths. Worse at night.  He has also noted increased urinary frequency and urgency ongoing x 1 month.  Nocturia 3 times at night.  Nml emptying, normal flow.  Some ache in perineal area occ.   No dysuria but after urinating feels uncomfortable.  No hematuria.  No ED issues, no ejaculation.  No new meds   No bike riding, but sits > 5 hours a day.  Feeling more tired lately, colder lately  TSH normal 09/28/2022  Does have sleep apnea but cannot tolerate CPAP.  Lab Results  Component Value Date   PSA 0.34 01/28/2022   PSA 0.32 01/18/2020   PSA 0.32 01/12/2019     FBS somewhat worse lately 125-171 ( one 204.. forgo will if you have inflammation whether bacterial in t  med) Lab Results  Component Value Date   HGBA1C 7.0 (H) 07/23/2022          Relevant past medical, surgical, family and social history reviewed and updated as indicated. Interim medical history since our last visit reviewed. Allergies and medications reviewed and updated. Outpatient Medications Prior to Visit  Medication Sig Dispense Refill   albuterol (VENTOLIN HFA) 108 (90 Base) MCG/ACT inhaler Inhale 2 puffs into the lungs every 6 (six) hours as needed for wheezing or shortness of breath. 8 g 0   atorvastatin (LIPITOR) 20 MG tablet TAKE 1 TABLET BY MOUTH EVERY DAY 90 tablet 3   ibuprofen (ADVIL) 800 MG tablet TAKE 1 TABLET BY MOUTH EVERY 8 HOURS AS NEEDED 30 tablet 0   levocetirizine (XYZAL) 5 MG tablet Take 1 tablet (5 mg total) by mouth every evening. 30 tablet 0   losartan (COZAAR) 25 MG tablet TAKE 1 TABLET BY MOUTH EVERY DAY 90 tablet 3   mometasone (NASONEX) 50 MCG/ACT nasal spray INSTILL 2 SPRAYS IN NOSTRILS DAILY AS NEEDED FOR CONGESTION 51 each 3   montelukast (SINGULAIR) 10 MG tablet TAKE 1 TABLET BY MOUTH EVERYDAY AT BEDTIME 90 tablet 3   Multiple Vitamin (MULTIVITAMIN WITH MINERALS) TABS tablet Take 1 tablet by mouth daily.     omeprazole (PRILOSEC) 20 MG capsule TAKE 1 CAPSULE BY  MOUTH EVERY DAY 90 capsule 1   ONE TOUCH LANCETS MISC Use to test blood sugar once daily 200 each 2   tiZANidine (ZANAFLEX) 2 MG tablet TAKE 1 TABLET BY MOUTH AT BEDTIME AS NEEDED FOR MUSCLE SPASM 90 tablet 0   XIGDUO XR 11-998 MG TB24 TAKE 1 TABLET BY MOUTH EVERY DAY 90 tablet 1   predniSONE (DELTASONE) 20 MG tablet 3 tabs by mouth daily x 3 days, then 2 tabs by mouth daily x 2 days then 1 tab by mouth daily x 2 days 15 tablet 0   No facility-administered medications prior to visit.     Per HPI unless specifically indicated in ROS section below Review of Systems  Constitutional:  Positive for fatigue. Negative for fever.  HENT:  Negative for ear pain.   Eyes:  Negative for pain.   Respiratory:  Positive for cough. Negative for shortness of breath.   Cardiovascular:  Negative for chest pain, palpitations and leg swelling.  Gastrointestinal:  Negative for abdominal pain.  Genitourinary:  Positive for difficulty urinating, frequency and urgency. Negative for decreased urine volume, dysuria, flank pain, genital sores, hematuria, penile discharge, penile pain, penile swelling, scrotal swelling and testicular pain.  Musculoskeletal:  Negative for arthralgias.  Neurological:  Negative for syncope, light-headedness and headaches.  Psychiatric/Behavioral:  Negative for dysphoric mood.    Objective:  BP 120/80   Pulse (!) 109   Temp 97.6 F (36.4 C) (Temporal)   Ht 5' 9.5" (1.765 m)   Wt 176 lb 2 oz (79.9 kg)   SpO2 97%   BMI 25.64 kg/m   Wt Readings from Last 3 Encounters:  10/14/22 176 lb 2 oz (79.9 kg)  09/28/22 177 lb 6 oz (80.5 kg)  08/03/22 181 lb (82.1 kg)      Physical Exam Constitutional:      Appearance: He is well-developed.  HENT:     Head: Normocephalic.     Right Ear: Hearing normal.     Left Ear: Hearing normal.     Nose: Nose normal.  Neck:     Thyroid: No thyroid mass or thyromegaly.     Vascular: No carotid bruit.     Trachea: Trachea normal.  Cardiovascular:     Rate and Rhythm: Normal rate and regular rhythm.     Pulses: Normal pulses.     Heart sounds: Heart sounds not distant. No murmur heard.    No friction rub. No gallop.     Comments: No peripheral edema Pulmonary:     Effort: Pulmonary effort is normal. No respiratory distress.     Breath sounds: Normal breath sounds.  Abdominal:     Tenderness: There is abdominal tenderness in the left lower quadrant.  Skin:    General: Skin is warm and dry.     Findings: No rash.  Psychiatric:        Speech: Speech normal.        Behavior: Behavior normal.        Thought Content: Thought content normal.       Results for orders placed or performed in visit on 10/14/22  POCT  Urinalysis Dipstick (Automated)  Result Value Ref Range   Color, UA Yellow    Clarity, UA Clear    Glucose, UA Positive (A) Negative   Bilirubin, UA Negative    Ketones, UA Negative    Spec Grav, UA 1.010 1.010 - 1.025   Blood, UA Negative    pH, UA 6.0 5.0 - 8.0  Protein, UA Positive (A) Negative   Urobilinogen, UA 0.2 0.2 or 1.0 E.U./dL   Nitrite, UA Negative    Leukocytes, UA Negative Negative    Assessment and Plan  Urinary frequency Assessment & Plan: Acute, symptoms most consistent with prostatitis.  Likely subacute given no fever.  Urinalysis shows some blood sugar but trace and not seemingly enough to trigger urinary symptoms.  Patient has history of kidney stones but there is no blood in the urine and symptoms are not consistent with kidney stone.  Recommended prostate exam but patient would prefer to treat prior to doing exam. Will treat for presumed prostatitis with ciprofloxacin twice daily for 14 days. He will call if symptoms or not improving as expected for referral to urology.  Orders: -     POCT Urinalysis Dipstick (Automated) -     Hemoglobin A1c -     Comprehensive metabolic panel  Persistent cough for 3 weeks or longer Assessment & Plan: Chronic, negative chest x-ray.  Symptoms are most consistent with postinfectious/allergic/cough variant asthma.  Symptoms did improve temporarily on prednisone and seem to improve with albuterol inhaler.  Will try a trial of daily inhaled steroid controller with plan to wean off if symptoms are improving towards the summer after allergy season. Continue Flonase and oral antihistamine. If no improvement can consider pulmonary function tests for further evaluation   Other fatigue Assessment & Plan: Chronic intermittent.  Will evaluate with labs for possible secondary causes.  Could be secondary to untreated sleep apnea given he does not wear the CPAP given it is uncomfortable.  Orders: -     CBC with  Differential/Platelet -     Vitamin B12 -     VITAMIN D 25 Hydroxy (Vit-D Deficiency, Fractures)  Other orders -     Fluticasone Propionate HFA; Inhale 2 puffs into the lungs in the morning and at bedtime.  Dispense: 1 each; Refill: 12 -     Ciprofloxacin HCl; Take 1 tablet (500 mg total) by mouth 2 (two) times daily for 14 days.  Dispense: 28 tablet; Refill: 0    No follow-ups on file.   Kerby Nora, MD

## 2022-10-14 NOTE — Assessment & Plan Note (Signed)
Chronic intermittent.  Will evaluate with labs for possible secondary causes.  Could be secondary to untreated sleep apnea given he does not wear the CPAP given it is uncomfortable.

## 2022-10-14 NOTE — Assessment & Plan Note (Signed)
Acute, symptoms most consistent with prostatitis.  Likely subacute given no fever.  Urinalysis shows some blood sugar but trace and not seemingly enough to trigger urinary symptoms.  Patient has history of kidney stones but there is no blood in the urine and symptoms are not consistent with kidney stone.  Recommended prostate exam but patient would prefer to treat prior to doing exam. Will treat for presumed prostatitis with ciprofloxacin twice daily for 14 days. He will call if symptoms or not improving as expected for referral to urology.

## 2022-10-14 NOTE — Assessment & Plan Note (Addendum)
Chronic, negative chest x-ray.  Symptoms are most consistent with postinfectious/allergic/cough variant asthma.  Symptoms did improve temporarily on prednisone and seem to improve with albuterol inhaler.  Will try a trial of daily inhaled steroid controller with plan to wean off if symptoms are improving towards the summer after allergy season. Continue Flonase and oral antihistamine. If no improvement can consider pulmonary function tests for further evaluation

## 2022-10-15 ENCOUNTER — Emergency Department: Payer: Federal, State, Local not specified - PPO

## 2022-10-15 ENCOUNTER — Emergency Department
Admission: EM | Admit: 2022-10-15 | Discharge: 2022-10-15 | Disposition: A | Discharge status: Home / Self Care | Payer: Federal, State, Local not specified - PPO | Attending: Emergency Medicine | Admitting: Emergency Medicine

## 2022-10-15 ENCOUNTER — Telehealth: Payer: Self-pay

## 2022-10-15 DIAGNOSIS — R008 Other abnormalities of heart beat: Secondary | ICD-10-CM | POA: Diagnosis not present

## 2022-10-15 DIAGNOSIS — R000 Tachycardia, unspecified: Secondary | ICD-10-CM | POA: Insufficient documentation

## 2022-10-15 DIAGNOSIS — Z1152 Encounter for screening for COVID-19: Secondary | ICD-10-CM | POA: Insufficient documentation

## 2022-10-15 DIAGNOSIS — R059 Cough, unspecified: Secondary | ICD-10-CM | POA: Diagnosis not present

## 2022-10-15 DIAGNOSIS — R911 Solitary pulmonary nodule: Secondary | ICD-10-CM | POA: Insufficient documentation

## 2022-10-15 DIAGNOSIS — R0602 Shortness of breath: Secondary | ICD-10-CM | POA: Diagnosis not present

## 2022-10-15 LAB — CBC WITH DIFFERENTIAL/PLATELET
Abs Immature Granulocytes: 0.02 10*3/uL (ref 0.00–0.07)
Basophils Absolute: 0 10*3/uL (ref 0.0–0.1)
Basophils Relative: 1 %
Eosinophils Absolute: 0.1 10*3/uL (ref 0.0–0.5)
Eosinophils Relative: 2 %
HCT: 38.5 % — ABNORMAL LOW (ref 39.0–52.0)
Hemoglobin: 12.2 g/dL — ABNORMAL LOW (ref 13.0–17.0)
Immature Granulocytes: 0 %
Lymphocytes Relative: 21 %
Lymphs Abs: 1.7 10*3/uL (ref 0.7–4.0)
MCH: 25.8 pg — ABNORMAL LOW (ref 26.0–34.0)
MCHC: 31.7 g/dL (ref 30.0–36.0)
MCV: 81.6 fL (ref 80.0–100.0)
Monocytes Absolute: 0.8 10*3/uL (ref 0.1–1.0)
Monocytes Relative: 10 %
Neutro Abs: 5.5 10*3/uL (ref 1.7–7.7)
Neutrophils Relative %: 66 %
Platelets: 365 10*3/uL (ref 150–400)
RBC: 4.72 MIL/uL (ref 4.22–5.81)
RDW: 13.2 % (ref 11.5–15.5)
WBC: 8.3 10*3/uL (ref 4.0–10.5)
nRBC: 0 % (ref 0.0–0.2)

## 2022-10-15 LAB — BASIC METABOLIC PANEL
Anion gap: 15 (ref 5–15)
BUN: 11 mg/dL (ref 6–20)
CO2: 24 mmol/L (ref 22–32)
Calcium: 9 mg/dL (ref 8.9–10.3)
Chloride: 98 mmol/L (ref 98–111)
Creatinine, Ser: 0.86 mg/dL (ref 0.61–1.24)
GFR, Estimated: >60 mL/min (ref >60)
Glucose, Bld: 198 mg/dL — ABNORMAL HIGH (ref 70–99)
Potassium: 3.4 mmol/L — ABNORMAL LOW (ref 3.5–5.1)
Sodium: 137 mmol/L (ref 135–145)

## 2022-10-15 LAB — RESP PANEL BY RT-PCR (RSV, FLU A&B, COVID)  RVPGX2
Influenza A by PCR: NEGATIVE
Influenza B by PCR: NEGATIVE
Resp Syncytial Virus by PCR: NEGATIVE
SARS Coronavirus 2 by RT PCR: NEGATIVE

## 2022-10-15 LAB — TROPONIN I (HIGH SENSITIVITY)
Troponin I (High Sensitivity): 3 ng/L (ref <18)
Troponin I (High Sensitivity): <2 ng/L (ref <18)

## 2022-10-15 LAB — D-DIMER, QUANTITATIVE: D-Dimer, Quant: 0.49 ug/mL-FEU (ref 0.00–0.50)

## 2022-10-15 MED ORDER — IOHEXOL 350 MG/ML SOLN
75.0000 mL | Freq: Once | INTRAVENOUS | Status: AC | PRN
  Administered 2022-10-15: 75 mL via INTRAVENOUS

## 2022-10-15 NOTE — Telephone Encounter (Signed)
Pt called in and said he started cipro on 10/14/22 and pt had one dose of cipro this morning. Pt used fluticasone inhaler this morning. Pt has not used albuterol inhaler since 10/14/22. Pt said for the last hour his heart rate has average 128. Pt said no CP but pt is SOB. Pt said his heart is beating fast and hard. Still dry cough. Pt wants to know what to do. Pt is driving on his way home now. I spoke with Dr Ermalene Searing and she advised pt does need to be seen and no available appts at [facility] or [facility] and pt could go to [facility] for eval. Pt is going to [facility] now and if pt condition worsens pt will pull over and call 911. UC & ED precautions given and pt voiced understanding. Sending note to Dr Ermalene Searing.

## 2022-10-15 NOTE — Telephone Encounter (Signed)
Noted  

## 2022-10-15 NOTE — ED Provider Notes (Signed)
[facility] Provider Note    Event Date/Time   First MD Initiated Contact with Patient 10/15/22 1712     (approximate)   History   Tachycardia   HPI  Paul Flowers is a 52 y.o. male who presents to the urgency department today because of concerns for fast heart rate.  Noticed that his heart was beating fast today.  Has remained fairly elevated throughout the day into the 120s.  He did recently start Flovent and Cipro after a doctor's appointment yesterday.  He has been following with his primary care doctor for cough that is now been ongoing for the past few months.  He denies that it is productive.  Denies any associated chest pain.       Physical Exam   Triage Vital Signs: ED Triage Vitals  Enc Vitals Group     BP 10/15/22 1711 (!) 143/107     Pulse Rate 10/15/22 1711 (!) 119     Resp 10/15/22 1711 (!) 22     Temp --      Temp src --      SpO2 10/15/22 1711 98 %     Weight 10/15/22 1720 176 lb 2 oz (79.9 kg)     Height 10/15/22 1720 5' 9.5" (1.765 m)     Head Circumference --      Peak Flow --      Pain Score 10/15/22 1711 0     Pain Loc --      Pain Edu? --      Excl. in GC? --     Most recent vital signs: Vitals:   10/15/22 1711  BP: (!) 143/107  Pulse: (!) 119  Resp: (!) 22  SpO2: 98%   General: Awake, alert, oriented. CV:  Good peripheral perfusion. Tachycardia, regular rhythm. Resp:  Normal effort. Lungs clear. Abd:  No distention.   ED Results / Procedures / Treatments   Labs (all labs ordered are listed, but only abnormal results are displayed) Labs Reviewed  CBC WITH DIFFERENTIAL/PLATELET - Abnormal; Notable for the following components:      Result Value   Hemoglobin 12.2 (*)    HCT 38.5 (*)    MCH 25.8 (*)    All other components within normal limits  BASIC METABOLIC PANEL - Abnormal; Notable for the following components:   Potassium 3.4 (*)    Glucose, Bld 198 (*)    All other components within normal limits   RESP PANEL BY RT-PCR (RSV, FLU A&B, COVID)  RVPGX2  D-DIMER, QUANTITATIVE (NOT AT [facility])  TROPONIN I (HIGH SENSITIVITY)  TROPONIN I (HIGH SENSITIVITY)     EKG  I, Phineas Semen, attending physician, personally viewed and interpreted this EKG  EKG Time: 1707 Rate: 128 Rhythm: sinus tachycardia Axis: normal Intervals: qtc 437 QRS: narrow, LVH ST changes: no st elevation Impression: abnormal ekg    RADIOLOGY I independently interpreted and visualized the CXR. My interpretation: No pneumonia Radiology interpretation: IMPRESSION:  No active cardiopulmonary disease.    I independently interpreted and visualized the CT angio PE. My interpretation: No large PE Radiology interpretation:  IMPRESSION:  1. No CT evidence for acute pulmonary embolus.  2. 10 mm left lower lobe pulmonary nodule, new since CT stone study  of 03/28/2020. Primary neoplasm would be a consideration and given  findings described below, metastatic disease is not excluded.  3. Exophytic 3.3 cm lesion upper pole left kidney is new since the  2021 exam with average  attenuation higher than would be expected for  a simple cyst. Neoplasm cannot be excluded. CT abdomen with and  without contrast or MRI abdomen with and without contrast could be  used to further evaluate. Additional left renal lesions evident,  incompletely characterized on today's noncontrast study. These could  also be further assessed at the time of follow-up imaging.  4. Cholelithiasis.  5.  Aortic Atherosclerosis (ICD10-I70.0).       PROCEDURES:  Critical Care performed: No   MEDICATIONS ORDERED IN ED: Medications - No data to display   IMPRESSION / MDM / ASSESSMENT AND PLAN / ED COURSE  I reviewed the triage vital signs and the nursing notes.                              Differential diagnosis includes, but is not limited to, pneumonia, bronchitis, PE, anemia  Patient's presentation is most consistent with acute  presentation with potential threat to life or bodily function.   The patient is on the cardiac monitor to evaluate for evidence of arrhythmia and/or significant heart rate changes.  Patient presented to the emergency department today because of concerns for fast heart rate and also has complaint of cough that is lasted for a number of months.  On exam patient is slightly tachycardic.  He is not hypoxic.  Chest x-ray without pneumonia or pneumothorax. Did obtain a d-dimer which was 0.49.  Had a discussion with the patient.  At this point given negative D-dimer have a lower suspicion for PE however if it is more of a chronic PE responding to the cough potentially could be negative at this time.  I did discuss risk of CT radiation with patient.  At this time he did opt to obtain the CT.  While the CT did not show signs of pulmonary embolism there was concern for a new pulmonary nodule as well as a cystic type lesion to his left kidney concerning for metastatic cancer.  I did discuss this with the patient.  Did discuss importance of following up with oncology for further testing and workup.      FINAL CLINICAL IMPRESSION(S) / ED DIAGNOSES   Final diagnoses:  Cough, unspecified type  Pulmonary nodule    Note:  This document was prepared using Dragon voice recognition software and may include unintentional dictation errors.    Phineas Semen, MD 10/15/22 [phone]

## 2022-10-15 NOTE — Discharge Instructions (Signed)
As we discussed please call the oncology clinic Monday to set up an appointment. Please seek medical attention for any high fevers, chest pain, shortness of breath, change in behavior, persistent vomiting, bloody stool or any other new or concerning symptoms.

## 2022-10-15 NOTE — ED Triage Notes (Signed)
Pt presents to the ED via POV due to increasing SOB. Pt states he has been dealing with this since Nov. 2023. Pt states his PCP started him on Flovent HFA yesterday and since he took it this morning, his symptoms increased. Pt is able to talk in full sentences. Pt A&Ox4

## 2022-10-19 ENCOUNTER — Inpatient Hospital Stay: Payer: Federal, State, Local not specified - PPO

## 2022-10-19 ENCOUNTER — Inpatient Hospital Stay: Payer: Federal, State, Local not specified - PPO | Attending: Internal Medicine | Admitting: Internal Medicine

## 2022-10-19 ENCOUNTER — Encounter: Payer: Self-pay | Admitting: Internal Medicine

## 2022-10-19 ENCOUNTER — Encounter: Payer: Self-pay | Admitting: *Deleted

## 2022-10-19 VITALS — BP 124/84 | HR 103 | Temp 98.7°F | Resp 17 | Ht 70.0 in | Wt 174.2 lb

## 2022-10-19 DIAGNOSIS — D649 Anemia, unspecified: Secondary | ICD-10-CM | POA: Diagnosis not present

## 2022-10-19 DIAGNOSIS — N289 Disorder of kidney and ureter, unspecified: Secondary | ICD-10-CM | POA: Insufficient documentation

## 2022-10-19 DIAGNOSIS — N2889 Other specified disorders of kidney and ureter: Secondary | ICD-10-CM | POA: Insufficient documentation

## 2022-10-19 DIAGNOSIS — R053 Chronic cough: Secondary | ICD-10-CM | POA: Diagnosis not present

## 2022-10-19 DIAGNOSIS — R911 Solitary pulmonary nodule: Secondary | ICD-10-CM | POA: Diagnosis not present

## 2022-10-19 LAB — IRON AND TIBC
Iron: 26 ug/dL — ABNORMAL LOW (ref 45–182)
Saturation Ratios: 8 % — ABNORMAL LOW (ref 17.9–39.5)
TIBC: 325 ug/dL (ref 250–450)
UIBC: 299 ug/dL

## 2022-10-19 LAB — FOLATE: Folate: 22 ng/mL (ref >5.9)

## 2022-10-19 LAB — FERRITIN: Ferritin: 130 ng/mL (ref 24–336)
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
## 2022-10-19 NOTE — Progress Notes (Signed)
Met with patient during initial consult with Dr. Alena Bills. All questions answered during visit. Reviewed upcoming appts. Contact info given and instructed to call with any further questions or needs. Pt verbalized understanding. Will follow up at next clinic visit.

## 2022-10-25 ENCOUNTER — Other Ambulatory Visit: Payer: Self-pay | Admitting: Family Medicine

## 2022-10-28 ENCOUNTER — Ambulatory Visit
Admission: RE | Admit: 2022-10-28 | Discharge: 2022-10-28 | Disposition: A | Discharge status: Home / Self Care | Payer: Federal, State, Local not specified - PPO | Source: Ambulatory Visit | Attending: Internal Medicine | Admitting: Internal Medicine

## 2022-10-28 DIAGNOSIS — D649 Anemia, unspecified: Secondary | ICD-10-CM | POA: Insufficient documentation

## 2022-10-28 DIAGNOSIS — N200 Calculus of kidney: Secondary | ICD-10-CM | POA: Diagnosis not present

## 2022-10-28 DIAGNOSIS — N281 Cyst of kidney, acquired: Secondary | ICD-10-CM | POA: Diagnosis not present

## 2022-10-28 MED ORDER — IOHEXOL 300 MG/ML  SOLN
100.0000 mL | Freq: Once | INTRAMUSCULAR | Status: AC | PRN
  Administered 2022-10-28: 100 mL via INTRAVENOUS

## 2022-10-29 ENCOUNTER — Other Ambulatory Visit (INDEPENDENT_AMBULATORY_CARE_PROVIDER_SITE_OTHER): Payer: Federal, State, Local not specified - PPO

## 2022-10-29 DIAGNOSIS — R000 Tachycardia, unspecified: Secondary | ICD-10-CM | POA: Diagnosis not present

## 2022-10-29 DIAGNOSIS — E1165 Type 2 diabetes mellitus with hyperglycemia: Secondary | ICD-10-CM

## 2022-10-29 DIAGNOSIS — G4733 Obstructive sleep apnea (adult) (pediatric): Secondary | ICD-10-CM | POA: Diagnosis not present

## 2022-10-29 LAB — BASIC METABOLIC PANEL
BUN: 9 mg/dL (ref 6–23)
CO2: 28 mEq/L (ref 19–32)
Calcium: 9.7 mg/dL (ref 8.4–10.5)
Chloride: 100 mEq/L (ref 96–112)
Creatinine, Ser: 0.67 mg/dL (ref 0.40–1.50)
GFR: 106.46 mL/min (ref >60.00)
Glucose, Bld: 87 mg/dL (ref 70–99)
Potassium: 3.7 mEq/L (ref 3.5–5.1)
Sodium: 137 mEq/L (ref 135–145)

## 2022-10-29 LAB — HEMOGLOBIN A1C: Hgb A1c MFr Bld: 7.7 % — ABNORMAL HIGH (ref 4.6–6.5)

## 2022-11-02 ENCOUNTER — Encounter: Payer: Self-pay | Admitting: Internal Medicine

## 2022-11-02 ENCOUNTER — Inpatient Hospital Stay: Payer: Federal, State, Local not specified - PPO | Admitting: Internal Medicine

## 2022-11-02 VITALS — BP 135/94 | HR 110 | Temp 97.6°F | Resp 20 | Wt 175.8 lb

## 2022-11-02 DIAGNOSIS — N2889 Other specified disorders of kidney and ureter: Secondary | ICD-10-CM | POA: Diagnosis not present

## 2022-11-02 DIAGNOSIS — D508 Other iron deficiency anemias: Secondary | ICD-10-CM

## 2022-11-02 DIAGNOSIS — R911 Solitary pulmonary nodule: Secondary | ICD-10-CM | POA: Diagnosis not present

## 2022-11-02 DIAGNOSIS — N289 Disorder of kidney and ureter, unspecified: Secondary | ICD-10-CM | POA: Diagnosis not present

## 2022-11-02 DIAGNOSIS — D649 Anemia, unspecified: Secondary | ICD-10-CM | POA: Diagnosis not present

## 2022-11-02 DIAGNOSIS — R519 Headache, unspecified: Secondary | ICD-10-CM | POA: Diagnosis not present

## 2022-11-02 DIAGNOSIS — R0602 Shortness of breath: Secondary | ICD-10-CM | POA: Insufficient documentation

## 2022-11-02 DIAGNOSIS — G8929 Other chronic pain: Secondary | ICD-10-CM

## 2022-11-02 DIAGNOSIS — D509 Iron deficiency anemia, unspecified: Secondary | ICD-10-CM | POA: Insufficient documentation

## 2022-11-02 DIAGNOSIS — R053 Chronic cough: Secondary | ICD-10-CM | POA: Diagnosis not present

## 2022-11-02 MED ORDER — FERROUS GLUCONATE 324 (38 FE) MG PO TABS: 324.0000 mg | ORAL_TABLET | Freq: Every day | ORAL | 2 refills | Status: DC

## 2022-11-02 NOTE — Patient Instructions (Signed)
Start iron supplements once daily

## 2022-11-02 NOTE — Progress Notes (Signed)
Patient states yesterday he had some shortness of breathe, chest pain and a headache and his heart rate was in the 120's for about an hour. Patient states this has happened at least 3 times a week since April 12.
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
## 2022-11-03 ENCOUNTER — Ambulatory Visit: Payer: Federal, State, Local not specified - PPO | Admitting: Endocrinology

## 2022-11-03 ENCOUNTER — Encounter: Payer: Self-pay | Admitting: Endocrinology

## 2022-11-03 ENCOUNTER — Encounter: Payer: Self-pay | Admitting: Urology

## 2022-11-03 ENCOUNTER — Ambulatory Visit: Payer: Federal, State, Local not specified - PPO | Admitting: Urology

## 2022-11-03 ENCOUNTER — Inpatient Hospital Stay
Payer: Federal, State, Local not specified - PPO | Attending: Internal Medicine | Admitting: Hospice and Palliative Medicine

## 2022-11-03 VITALS — BP 134/92 | HR 97 | Ht 70.0 in | Wt 175.0 lb

## 2022-11-03 VITALS — BP 128/85 | HR 95 | Ht 70.0 in | Wt 176.0 lb

## 2022-11-03 DIAGNOSIS — I10 Essential (primary) hypertension: Secondary | ICD-10-CM | POA: Insufficient documentation

## 2022-11-03 DIAGNOSIS — N2889 Other specified disorders of kidney and ureter: Secondary | ICD-10-CM

## 2022-11-03 DIAGNOSIS — Z7984 Long term (current) use of oral hypoglycemic drugs: Secondary | ICD-10-CM | POA: Diagnosis not present

## 2022-11-03 DIAGNOSIS — D509 Iron deficiency anemia, unspecified: Secondary | ICD-10-CM | POA: Insufficient documentation

## 2022-11-03 DIAGNOSIS — R0602 Shortness of breath: Secondary | ICD-10-CM | POA: Insufficient documentation

## 2022-11-03 DIAGNOSIS — K219 Gastro-esophageal reflux disease without esophagitis: Secondary | ICD-10-CM | POA: Insufficient documentation

## 2022-11-03 DIAGNOSIS — E1165 Type 2 diabetes mellitus with hyperglycemia: Secondary | ICD-10-CM

## 2022-11-03 DIAGNOSIS — R911 Solitary pulmonary nodule: Secondary | ICD-10-CM

## 2022-11-03 DIAGNOSIS — R053 Chronic cough: Secondary | ICD-10-CM | POA: Insufficient documentation

## 2022-11-03 DIAGNOSIS — N289 Disorder of kidney and ureter, unspecified: Secondary | ICD-10-CM | POA: Insufficient documentation

## 2022-11-03 NOTE — Patient Instructions (Addendum)
Take 2 Xigduo daily, call for new Rx  Check blood sugars on waking up 2-3 days a week  Also check blood sugars about 2 hours after meals and do this after different meals by rotation  Recommended blood sugar levels on waking up are 90-130 and about 2 hours after meal is 130-160  Please bring your blood sugar monitor to each visit, thank you
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
## 2022-11-03 NOTE — Patient Instructions (Signed)
We have placed a referral to Dr. Dwana Curd with [facility] in [place].  He has a urologic oncologist and we will discuss more about surgery with you.  We also ordered an MRI to evaluate for tumor thrombus  Kidney Cancer  Kidney cancer is a type of cancer in which an abnormal growth of cells (tumor) forms in one or both kidneys. The kidneys filter waste from your blood and make urine. Kidney cancer may spread to other parts of your body. This type of cancer may also be called renal cell carcinoma. What are the causes? The cause of this condition is not known. What increases the risk? You may be more likely to develop kidney cancer if: You are over age 24. You have certain conditions that are passed from parent to child (inherited). These include von Hippel-Lindau disease, tuberous sclerosis, and hereditary papillary renal carcinoma. You smoke or have been exposed to certain chemicals. You have advanced kidney disease, especially if you need to use a machine to clean your blood (dialysis). You are obese. You have high blood pressure (hypertension). You are male. What are the signs or symptoms? At first, kidney cancer may not cause any symptoms. As the cancer grows, symptoms may include: Blood in the urine. Pain in the upper back or abdomen, just below the rib cage. You may feel pain on one or both sides of your body. Tiredness (fatigue). Weight loss that cannot be explained. Fever. How is this diagnosed? This condition may be diagnosed based on: Your symptoms. Your medical history. A physical exam. You may also have tests, such as: Blood and urine tests. X-rays. Imaging tests, such as CT scans, MRIs, and PET scans. Angiogram. This is when dye is injected into your blood to show your blood vessels. Intravenous pyelogram. This is when dye is injected into your blood to show your kidneys and the other organs that help with making and storing urine. Biopsy. This is when a piece  of tissue is removed from your kidney and looked at under a microscope. Your cancer will be assessed (staged), based on how severe it is and how much it has spread. How is this treated? Treatment depends on the type and stage of the cancer. Treatment may include: Surgery to remove: Just the tumor (nephron-sparing surgery). The entire kidney (nephrectomy). The kidney, some of the healthy tissue around it, nearby lymph nodes, and sometimes the adrenal gland (radical nephrectomy). Medicines to: Kill cancer cells (chemotherapy). Help your body's disease-fighting system (immune system) fight cancer cells. This is known as immunotherapy. Radiation therapy. This uses high-energy rays to kill cancer cells. Targeted therapy to only attack genes and proteins that allow a tumor to grow. This type of therapy tries to limit damage to your healthy cells. Cryoablation. This uses gas or liquid to freeze cancer cells. It is sent through a needle. Radiofrequency ablation. This uses high-energy radio waves to destroy cancer cells. It is sent through a needle-like probe. Embolization. This is a procedure to block the artery that supplies blood to the tumor. Follow these instructions at home: Eating and drinking Some of your treatments may affect your appetite and your ability to chew and swallow. If you have problems eating, or if you do not have an appetite, meet with an expert in diet and nutrition (dietitian). If you have side effects that affect eating, it may help to: Eat smaller meals more often. Eat bland or soft foods. Avoid foods that are hot, spicy, or hard to swallow.  Drink shakes or supplements that are high in nutrition and calories. Do not drink alcohol. Lifestyle Do not use any products that contain nicotine or tobacco. These products include cigarettes, chewing tobacco, and vaping devices, such as e-cigarettes. If you need help quitting, ask your health care provider. Get enough sleep. Most  adults need 6-8 hours of sleep each night. During treatment, you may need more sleep. General instructions Take over-the-counter and prescription medicines only as told by your health care provider. Consider joining a support group. This can help you cope with the stress of having kidney cancer. Work with your health care provider to manage any side effects of treatment. Keep all follow-up visits. Your health care provider will want to make sure your treatment is working. Where to find more information American Cancer Society: cancer.org [employer] (NCI): cancer.gov Contact a health care provider if: You bruise or bleed easily. You lose weight without trying. You have new or worse fatigue or weakness. You have a fever. Your pain suddenly increases. You have more blood in your urine. Your skin or the white parts of your eyes turn yellow (jaundice). Get help right away if: You have chest pain. You are short of breath. You have irregular heartbeats (palpitations). These symptoms may be an emergency. Get help right away. Call 911. Do not wait to see if the symptoms will go away. Do not drive yourself to the hospital. This information is not intended to replace advice given to you by your health care provider. Make sure you discuss any questions you have with your health care provider. Document Revised: 12/01/2021 Document Reviewed: 12/01/2021 Elsevier Patient Education  2023 [employer].

## 2022-11-03 NOTE — Progress Notes (Signed)
Multidisciplinary Oncology Council Documentation  Paul Flowers was presented by our [facility] on 11/03/2022, which included representatives from:  Palliative Care Dietitian  Physical/Occupational Therapist Nurse Navigator Genetics Speech Therapist Social work Survivorship RN Financial Navigator Research RN   Sajan currently presents with history of lung mass  We reviewed previous medical and familial history, history of present illness, and recent lab results along with all available histopathologic and imaging studies. The MOC considered available treatment options and made the following recommendations/referrals:  SW  The MOC is a meeting of clinicians from various specialty areas who evaluate and discuss patients for whom a multidisciplinary approach is being considered. Final determinations in the plan of care are those of the provider(s).   Today's extended care, comprehensive team conference, Damontae was not present for the discussion and was not examined.
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
## 2022-11-04 LAB — HM DIABETES EYE EXAM

## 2022-11-09 DIAGNOSIS — N200 Calculus of kidney: Secondary | ICD-10-CM | POA: Diagnosis not present

## 2022-11-09 DIAGNOSIS — C778 Secondary and unspecified malignant neoplasm of lymph nodes of multiple regions: Secondary | ICD-10-CM | POA: Diagnosis not present

## 2022-11-09 DIAGNOSIS — C642 Malignant neoplasm of left kidney, except renal pelvis: Secondary | ICD-10-CM | POA: Diagnosis not present

## 2022-11-10 ENCOUNTER — Ambulatory Visit
Admission: RE | Admit: 2022-11-10 | Discharge: 2022-11-10 | Disposition: A | Discharge status: Home / Self Care | Payer: Federal, State, Local not specified - PPO | Source: Ambulatory Visit | Attending: Internal Medicine | Admitting: Internal Medicine

## 2022-11-10 DIAGNOSIS — R519 Headache, unspecified: Secondary | ICD-10-CM | POA: Diagnosis not present

## 2022-11-10 DIAGNOSIS — G8929 Other chronic pain: Secondary | ICD-10-CM | POA: Insufficient documentation

## 2022-11-10 MED ORDER — GADOBUTROL 1 MMOL/ML IV SOLN
7.5000 mL | Freq: Once | INTRAVENOUS | Status: AC | PRN
  Administered 2022-11-10: 7.5 mL via INTRAVENOUS

## 2022-11-12 ENCOUNTER — Other Ambulatory Visit: Payer: Self-pay | Admitting: Urology

## 2022-11-12 DIAGNOSIS — N2889 Other specified disorders of kidney and ureter: Secondary | ICD-10-CM

## 2022-11-15 ENCOUNTER — Other Ambulatory Visit: Payer: Self-pay | Admitting: Internal Medicine

## 2022-11-15 ENCOUNTER — Telehealth: Payer: Self-pay

## 2022-11-15 ENCOUNTER — Ambulatory Visit
Admission: RE | Admit: 2022-11-15 | Discharge: 2022-11-15 | Disposition: A | Discharge status: Home / Self Care | Payer: Federal, State, Local not specified - PPO | Source: Ambulatory Visit | Attending: Urology | Admitting: Urology

## 2022-11-15 DIAGNOSIS — N2889 Other specified disorders of kidney and ureter: Secondary | ICD-10-CM | POA: Diagnosis not present

## 2022-11-15 DIAGNOSIS — C649 Malignant neoplasm of unspecified kidney, except renal pelvis: Secondary | ICD-10-CM | POA: Diagnosis not present

## 2022-11-15 MED ORDER — GADOBUTROL 1 MMOL/ML IV SOLN
8.0000 mL | Freq: Once | INTRAVENOUS | Status: AC | PRN
  Administered 2022-11-15: 8 mL via INTRAVENOUS

## 2022-11-15 NOTE — Telephone Encounter (Signed)
Requested by Dr.A f/u on lung biopsy. Spoke to [place] no request sent and Patient not scheduled. Dr. Mervyn Skeeters aware. Faxed request to [phone]. Biopsy pending approval by IR

## 2022-11-16 ENCOUNTER — Ambulatory Visit: Payer: Federal, State, Local not specified - PPO | Admitting: Internal Medicine

## 2022-11-17 ENCOUNTER — Encounter
Admission: RE | Admit: 2022-11-17 | Discharge: 2022-11-17 | Disposition: A | Discharge status: Home / Self Care | Payer: Federal, State, Local not specified - PPO | Source: Ambulatory Visit | Attending: Internal Medicine | Admitting: Internal Medicine

## 2022-11-17 DIAGNOSIS — R911 Solitary pulmonary nodule: Secondary | ICD-10-CM | POA: Diagnosis not present

## 2022-11-17 DIAGNOSIS — C649 Malignant neoplasm of unspecified kidney, except renal pelvis: Secondary | ICD-10-CM | POA: Diagnosis not present

## 2022-11-17 MED ORDER — TECHNETIUM TC 99M MEDRONATE IV KIT
20.0000 | PACK | Freq: Once | INTRAVENOUS | Status: AC | PRN
  Administered 2022-11-17: 20.12 via INTRAVENOUS

## 2022-11-18 ENCOUNTER — Other Ambulatory Visit: Payer: Self-pay | Admitting: Family Medicine

## 2022-11-18 DIAGNOSIS — G8929 Other chronic pain: Secondary | ICD-10-CM

## 2022-11-18 DIAGNOSIS — M62838 Other muscle spasm: Secondary | ICD-10-CM

## 2022-11-18 NOTE — Telephone Encounter (Signed)
Last office visit 10/14/22 for cough and urinary frequency.  Last refilled 06/25/22 for #30 with no refills.  Next appt: No future appointments with PCP.

## 2022-11-18 NOTE — Telephone Encounter (Signed)
Call patient Given current ongoing kidney issues I would suggest instead of ibuprofen using Tylenol for back pain as it is not processed through the kidney.  If he feels that this medication is necessary have him speak with his urologist to make sure they have no concerns about using this medication.

## 2022-11-18 NOTE — Telephone Encounter (Signed)
Paul Flowers notified as instructed by telephone.  Patient states understanding.  Will give the Tylenol a try and will call us back if no relief with Tylenol for further recommendations.

## 2022-11-19 ENCOUNTER — Inpatient Hospital Stay: Payer: Federal, State, Local not specified - PPO | Admitting: Internal Medicine

## 2022-11-19 ENCOUNTER — Encounter: Payer: Self-pay | Admitting: Internal Medicine

## 2022-11-19 VITALS — BP 136/90 | HR 109 | Temp 99.5°F | Wt 172.2 lb

## 2022-11-19 DIAGNOSIS — R911 Solitary pulmonary nodule: Secondary | ICD-10-CM

## 2022-11-19 DIAGNOSIS — I10 Essential (primary) hypertension: Secondary | ICD-10-CM | POA: Diagnosis not present

## 2022-11-19 DIAGNOSIS — N2889 Other specified disorders of kidney and ureter: Secondary | ICD-10-CM

## 2022-11-19 DIAGNOSIS — N289 Disorder of kidney and ureter, unspecified: Secondary | ICD-10-CM | POA: Diagnosis not present

## 2022-11-19 DIAGNOSIS — D508 Other iron deficiency anemias: Secondary | ICD-10-CM

## 2022-11-19 DIAGNOSIS — R0602 Shortness of breath: Secondary | ICD-10-CM | POA: Diagnosis not present

## 2022-11-19 DIAGNOSIS — K219 Gastro-esophageal reflux disease without esophagitis: Secondary | ICD-10-CM | POA: Diagnosis not present

## 2022-11-19 DIAGNOSIS — R053 Chronic cough: Secondary | ICD-10-CM | POA: Diagnosis not present

## 2022-11-19 DIAGNOSIS — D509 Iron deficiency anemia, unspecified: Secondary | ICD-10-CM | POA: Diagnosis not present

## 2022-11-19 NOTE — Progress Notes (Signed)
Pt is having some right hip pain he rates at a 7 and it radiates down to his leg. Plus he wants to discuss his Bone test results from the 15th.
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
## 2022-11-22 ENCOUNTER — Ambulatory Visit (HOSPITAL_COMMUNITY)
Admission: RE | Admit: 2022-11-22 | Discharge: 2022-11-22 | Disposition: A | Discharge status: Home / Self Care | Payer: Federal, State, Local not specified - PPO | Source: Ambulatory Visit | Attending: Internal Medicine | Admitting: Internal Medicine

## 2022-11-22 DIAGNOSIS — R911 Solitary pulmonary nodule: Secondary | ICD-10-CM

## 2022-11-22 DIAGNOSIS — C649 Malignant neoplasm of unspecified kidney, except renal pelvis: Secondary | ICD-10-CM | POA: Diagnosis not present

## 2022-11-24 ENCOUNTER — Telehealth: Payer: Self-pay | Admitting: Internal Medicine

## 2022-11-24 NOTE — Telephone Encounter (Signed)
I called and spoke with Dr. Dwana Curd Uro onc surgeon in [place] to follow up on the surgical plan for the patient. MRI is resulted. Dr. Berneice Heinrich informed the patient will be scheduled for nephrectomy soon.

## 2022-11-25 ENCOUNTER — Ambulatory Visit
Payer: Federal, State, Local not specified - PPO | Admitting: Student in an Organized Health Care Education/Training Program

## 2022-11-25 ENCOUNTER — Encounter: Payer: Self-pay | Admitting: Student in an Organized Health Care Education/Training Program

## 2022-11-25 ENCOUNTER — Other Ambulatory Visit: Payer: Self-pay | Admitting: Urology

## 2022-11-25 VITALS — BP 124/62 | HR 105 | Temp 97.8°F | Ht 70.0 in | Wt 171.8 lb

## 2022-11-25 DIAGNOSIS — R053 Chronic cough: Secondary | ICD-10-CM

## 2022-11-25 DIAGNOSIS — R911 Solitary pulmonary nodule: Secondary | ICD-10-CM | POA: Insufficient documentation

## 2022-11-25 MED ORDER — LORATADINE 10 MG PO TABS
10.0000 mg | ORAL_TABLET | Freq: Every day | ORAL | 3 refills | Status: DC
Start: 2022-11-25 — End: 2022-12-01

## 2022-11-25 MED ORDER — FLUTICASONE PROPIONATE 50 MCG/ACT NA SUSP
1.0000 | Freq: Every day | NASAL | 2 refills | Status: DC
Start: 2022-11-25 — End: 2023-11-02

## 2022-11-25 MED ORDER — SALINE SPRAY 0.65 % NA SOLN
1.0000 | Freq: Two times a day (BID) | NASAL | 1 refills | Status: DC
Start: 2022-11-25 — End: 2022-12-30
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
## 2022-11-26 NOTE — Patient Instructions (Addendum)
SURGICAL WAITING ROOM VISITATION Patients having surgery or a procedure may have no more than 2 support people in the waiting area - these visitors may rotate.    Children under the age of 56 must have an adult with them who is not the patient.  If the patient needs to stay at the hospital during part of their recovery, the visitor guidelines for inpatient rooms apply. Pre-op nurse will coordinate an appropriate time for 1 support person to accompany patient in pre-op.  This support person may not rotate.    Please refer to the [facility] website for the visitor guidelines for Inpatients (after your surgery is over and you are in a regular room).       Your procedure is scheduled on: 12-15-22   Report to [facility] Main Entrance    Report to admitting at 9:45 AM   Call this number if you have problems the morning of surgery [phone]   Do not eat food or drink liquids :After Midnight.            If you have questions, please contact your surgeon's office.   FOLLOW BOWEL PREP AND ANY ADDITIONAL PRE OP INSTRUCTIONS YOU RECEIVED FROM YOUR SURGEON'S OFFICE!!!      - One bottle of Magnesium Citrate  Oral Hygiene is also important to reduce your risk of infection.                                    Remember - BRUSH YOUR TEETH THE MORNING OF SURGERY WITH YOUR REGULAR TOOTHPASTE   Do NOT smoke after Midnight   Take these medicines the morning of surgery with A SIP OF WATER:  Atorvastatin  Omeprazole  How to Manage Your Diabetes Before and After Surgery  Why is it important to control my blood sugar before and after surgery? Improving blood sugar levels before and after surgery helps healing and can limit problems. A way of improving blood sugar control is eating a healthy diet by:  Eating less sugar and carbohydrates  Increasing activity/exercise  Talking with your doctor about reaching your blood sugar goals High blood sugars (greater than 180 mg/dL) can raise  your risk of infections and slow your recovery, so you will need to focus on controlling your diabetes during the weeks before surgery. Make sure that the doctor who takes care of your diabetes knows about your planned surgery including the date and location.  How do I manage my blood sugar before surgery? Check your blood sugar at least 4 times a day, starting 2 days before surgery, to make sure that the level is not too high or low. Check your blood sugar the morning of your surgery when you wake up and every 2 hours until you get to the Short Stay unit. If your blood sugar is less than 70 mg/dL, you will need to treat for low blood sugar: Do not take insulin. Treat a low blood sugar (less than 70 mg/dL) with  cup of clear juice (cranberry or apple), 4 glucose tablets, OR glucose gel. Recheck blood sugar in 15 minutes after treatment (to make sure it is greater than 70 mg/dL). If your blood sugar is not greater than 70 mg/dL on recheck, call [phone] for further instructions. Report your blood sugar to the short stay nurse when you get to Short Stay.  If you are admitted to the hospital after surgery: Your  blood sugar will be checked by the staff and you will probably be given insulin after surgery (instead of oral diabetes medicines) to make sure you have good blood sugar levels. The goal for blood sugar control after surgery is 80-180 mg/dL.   WHAT DO I DO ABOUT MY DIABETES MEDICATION?  Do not take oral diabetes medicines (pills) the morning of surgery.        Hold Xigduo 3 days before surgery (do not take after 12-11-22)  DO NOT TAKE THE FOLLOWING 7 DAYS PRIOR TO SURGERY: Ozempic, Wegovy, Rybelsus (Semaglutide), Byetta (exenatide), Bydureon (exenatide ER), Victoza, Saxenda (liraglutide), or Trulicity (dulaglutide) Mounjaro (Tirzepatide) Adlyxin (Lixisenatide), Polyethylene Glycol Loxenatide.   Bring CPAP mask and tubing day of surgery.                              You may not have  any metal on your body including  jewelry, and body piercing             Do not wear lotions, powders, cologne, or deodorant              Men may shave face and neck.   Do not bring valuables to the hospital. [facility] IS NOT RESPONSIBLE   FOR VALUABLES.   Contacts, dentures or bridgework may not be worn into surgery.   Bring small overnight bag day of surgery.   DO NOT BRING YOUR HOME MEDICATIONS TO THE HOSPITAL. PHARMACY WILL DISPENSE MEDICATIONS LISTED ON YOUR MEDICATION LIST TO YOU DURING YOUR ADMISSION IN THE HOSPITAL!   Special Instructions: Bring a copy of your healthcare power of attorney and living will documents the day of surgery if you haven't scanned them before.              Please read over the following fact sheets you were given: IF YOU HAVE QUESTIONS ABOUT YOUR PRE-OP INSTRUCTIONS PLEASE CALL [phone]  If  not be feeling well notify your surgeon if you develop symptoms. If you test positive for Covid or have been in contact with anyone that has tested positive in the last 10 days please notify you surgeon.  [facility] - Preparing for Surgery Before surgery, you can play an important role.  Because skin is not sterile, your skin needs to be as free of germs as possible.  You can reduce the number of germs on your skin by washing with CHG (chlorahexidine gluconate) soap before surgery.  CHG is an antiseptic cleaner which kills germs and bonds with the skin to continue killing germs even after washing. Please DO NOT use if you have an allergy to CHG or antibacterial soaps.  If your skin becomes reddened/irritated stop using the CHG and inform your nurse when you arrive at Short Stay. Do not shave (including legs and underarms) for at least 48 hours prior to the first CHG shower.  You may shave your face/neck.  Please follow these instructions carefully:  1.  Shower with CHG Soap the night before surgery and the  morning of surgery.  2.  If you choose to wash your  hair, wash your hair first as usual with your normal  shampoo.  3.  After you shampoo, rinse your hair and body thoroughly to remove the shampoo.                             4.  Use  CHG as you would any other liquid soap.  You can apply chg directly to the skin and wash.  Gently with a scrungie or clean washcloth.  5.  Apply the CHG Soap to your body ONLY FROM THE NECK DOWN.   Do not use on face/ open                           Wound or open sores. Avoid contact with eyes, ears mouth and genitals (private parts).                       Wash face,  Genitals (private parts) with your normal soap.             6.  Wash thoroughly, paying special attention to the area where your  surgery  will be performed.  7.  Thoroughly rinse your body with warm water from the neck down.  8.  DO NOT shower/wash with your normal soap after using and rinsing off the CHG Soap.             9.  Pat yourself dry with a clean towel.            10.  Wear clean pajamas.            11.  Place clean sheets on your bed the night of your first shower and do not  sleep with pets. Day of Surgery : Do not apply any lotions/deodorants the morning of surgery.  Please wear clean clothes to the hospital/surgery center.  FAILURE TO FOLLOW THESE INSTRUCTIONS MAY RESULT IN THE CANCELLATION OF YOUR SURGERY  PATIENT SIGNATURE_________________________________  ________________________________________________________________________   WHAT IS A BLOOD TRANSFUSION? Blood Transfusion Information  A transfusion is the replacement of blood or some of its parts. Blood is made up of multiple cells which provide different functions. Red blood cells carry oxygen and are used for blood loss replacement. White blood cells fight against infection. Platelets control bleeding. Plasma helps clot blood. Other blood products are available for specialized needs, such as hemophilia or other clotting disorders. BEFORE THE TRANSFUSION  Who gives blood  for transfusions?  Healthy volunteers who are fully evaluated to make sure their blood is safe. This is blood bank blood. Transfusion therapy is the safest it has ever been in the practice of medicine. Before blood is taken from a donor, a complete history is taken to make sure that person has no history of diseases nor engages in risky social behavior (examples are intravenous drug use or sexual activity with multiple partners). The donor's travel history is screened to minimize risk of transmitting infections, such as malaria. The donated blood is tested for signs of infectious diseases, such as HIV and hepatitis. The blood is then tested to be sure it is compatible with you in order to minimize the chance of a transfusion reaction. If you or a relative donates blood, this is often done in anticipation of surgery and is not appropriate for emergency situations. It takes many days to process the donated blood. RISKS AND COMPLICATIONS Although transfusion therapy is very safe and saves many lives, the main dangers of transfusion include:  Getting an infectious disease. Developing a transfusion reaction. This is an allergic reaction to something in the blood you were given. Every precaution is taken to prevent this. The decision to have a blood transfusion has been considered carefully by your caregiver before blood is given.  Blood is not given unless the benefits outweigh the risks. AFTER THE TRANSFUSION Right after receiving a blood transfusion, you will usually feel much better and more energetic. This is especially true if your red blood cells have gotten low (anemic). The transfusion raises the level of the red blood cells which carry oxygen, and this usually causes an energy increase. The nurse administering the transfusion will monitor you carefully for complications. HOME CARE INSTRUCTIONS  No special instructions are needed after a transfusion. You may find your energy is better. Speak with your  caregiver about any limitations on activity for underlying diseases you may have. SEEK MEDICAL CARE IF:  Your condition is not improving after your transfusion. You develop redness or irritation at the intravenous (IV) site. SEEK IMMEDIATE MEDICAL CARE IF:  Any of the following symptoms occur over the next 12 hours: Shaking chills. You have a temperature by mouth above 102 F (38.9 C), not controlled by medicine. Chest, back, or muscle pain. People around you feel you are not acting correctly or are confused. Shortness of breath or difficulty breathing. Dizziness and fainting. You get a rash or develop hives. You have a decrease in urine output. Your urine turns a dark color or changes to pink, red, or brown. Any of the following symptoms occur over the next 10 days: You have a temperature by mouth above 102 F (38.9 C), not controlled by medicine. Shortness of breath. Weakness after normal activity. The white part of the eye turns yellow (jaundice). You have a decrease in the amount of urine or are urinating less often. Your urine turns a dark color or changes to pink, red, or brown. Document Released: 06/18/2000 Document Revised: 09/13/2011 Document Reviewed: 02/05/2008 [facility] Patient Information 2014 [place], [place].  _______________________________________________________________________

## 2022-11-28 DIAGNOSIS — G4733 Obstructive sleep apnea (adult) (pediatric): Secondary | ICD-10-CM | POA: Diagnosis not present

## 2022-11-29 ENCOUNTER — Encounter: Payer: Self-pay | Admitting: Internal Medicine

## 2022-11-30 ENCOUNTER — Ambulatory Visit
Admission: RE | Admit: 2022-11-30 | Discharge: 2022-11-30 | Disposition: A | Discharge status: Home / Self Care | Payer: Federal, State, Local not specified - PPO | Source: Ambulatory Visit | Attending: Internal Medicine | Admitting: Internal Medicine

## 2022-11-30 DIAGNOSIS — G4730 Sleep apnea, unspecified: Secondary | ICD-10-CM | POA: Diagnosis not present

## 2022-11-30 DIAGNOSIS — R0609 Other forms of dyspnea: Secondary | ICD-10-CM | POA: Diagnosis not present

## 2022-11-30 DIAGNOSIS — R0602 Shortness of breath: Secondary | ICD-10-CM | POA: Insufficient documentation

## 2022-11-30 DIAGNOSIS — R001 Bradycardia, unspecified: Secondary | ICD-10-CM | POA: Insufficient documentation

## 2022-11-30 DIAGNOSIS — R911 Solitary pulmonary nodule: Secondary | ICD-10-CM | POA: Insufficient documentation

## 2022-11-30 DIAGNOSIS — R0600 Dyspnea, unspecified: Secondary | ICD-10-CM | POA: Insufficient documentation

## 2022-11-30 DIAGNOSIS — I10 Essential (primary) hypertension: Secondary | ICD-10-CM | POA: Diagnosis not present

## 2022-11-30 LAB — ECHOCARDIOGRAM COMPLETE
AR max vel: 2.46 cm2
AV Area VTI: 3 cm2
AV Area mean vel: 2.45 cm2
AV Mean grad: 3 mmHg
AV Peak grad: 5.2 mmHg
Ao pk vel: 1.14 m/s
Area-P 1/2: 4.77 cm2
S' Lateral: 3.3 cm

## 2022-11-30 NOTE — Progress Notes (Signed)
*  PRELIMINARY RESULTS* Echocardiogram 2D Echocardiogram has been performed.  Paul Flowers 11/30/2022, 9:28 AM

## 2022-12-01 ENCOUNTER — Encounter (HOSPITAL_COMMUNITY): Payer: Self-pay

## 2022-12-02 DIAGNOSIS — N200 Calculus of kidney: Secondary | ICD-10-CM | POA: Diagnosis not present

## 2022-12-02 DIAGNOSIS — C778 Secondary and unspecified malignant neoplasm of lymph nodes of multiple regions: Secondary | ICD-10-CM | POA: Diagnosis not present

## 2022-12-02 DIAGNOSIS — C642 Malignant neoplasm of left kidney, except renal pelvis: Secondary | ICD-10-CM | POA: Diagnosis not present

## 2022-12-04 DIAGNOSIS — I2699 Other pulmonary embolism without acute cor pulmonale: Secondary | ICD-10-CM

## 2022-12-04 HISTORY — DX: Complication of other artery following a procedure, not elsewhere classified, initial encounter: I26.99

## 2022-12-06 ENCOUNTER — Encounter (HOSPITAL_COMMUNITY)
Admission: RE | Admit: 2022-12-06 | Discharge: 2022-12-06 | Disposition: A | Discharge status: Home / Self Care | Payer: Federal, State, Local not specified - PPO | Source: Ambulatory Visit | Attending: Urology | Admitting: Urology

## 2022-12-06 ENCOUNTER — Encounter (HOSPITAL_COMMUNITY): Payer: Self-pay

## 2022-12-06 ENCOUNTER — Other Ambulatory Visit: Payer: Self-pay

## 2022-12-06 VITALS — BP 114/90 | HR 119 | Temp 98.2°F | Ht 70.0 in | Wt 164.0 lb

## 2022-12-06 DIAGNOSIS — N2889 Other specified disorders of kidney and ureter: Secondary | ICD-10-CM | POA: Diagnosis not present

## 2022-12-06 DIAGNOSIS — E119 Type 2 diabetes mellitus without complications: Secondary | ICD-10-CM

## 2022-12-06 DIAGNOSIS — D649 Anemia, unspecified: Secondary | ICD-10-CM | POA: Insufficient documentation

## 2022-12-06 DIAGNOSIS — Z01812 Encounter for preprocedural laboratory examination: Secondary | ICD-10-CM | POA: Insufficient documentation

## 2022-12-06 DIAGNOSIS — I10 Essential (primary) hypertension: Secondary | ICD-10-CM | POA: Insufficient documentation

## 2022-12-06 DIAGNOSIS — G4730 Sleep apnea, unspecified: Secondary | ICD-10-CM | POA: Diagnosis not present

## 2022-12-06 DIAGNOSIS — K219 Gastro-esophageal reflux disease without esophagitis: Secondary | ICD-10-CM | POA: Diagnosis not present

## 2022-12-06 DIAGNOSIS — Z01818 Encounter for other preprocedural examination: Secondary | ICD-10-CM

## 2022-12-06 DIAGNOSIS — R911 Solitary pulmonary nodule: Secondary | ICD-10-CM | POA: Diagnosis not present

## 2022-12-06 DIAGNOSIS — D759 Disease of blood and blood-forming organs, unspecified: Secondary | ICD-10-CM | POA: Diagnosis not present

## 2022-12-06 DIAGNOSIS — R053 Chronic cough: Secondary | ICD-10-CM | POA: Diagnosis not present

## 2022-12-06 HISTORY — DX: Malignant (primary) neoplasm, unspecified: C80.1

## 2022-12-06 LAB — CBC
HCT: 30.2 % — ABNORMAL LOW (ref 39.0–52.0)
Hemoglobin: 9.3 g/dL — ABNORMAL LOW (ref 13.0–17.0)
MCH: 25 pg — ABNORMAL LOW (ref 26.0–34.0)
MCHC: 30.8 g/dL (ref 30.0–36.0)
MCV: 81.2 fL (ref 80.0–100.0)
Platelets: 476 10*3/uL — ABNORMAL HIGH (ref 150–400)
RBC: 3.72 MIL/uL — ABNORMAL LOW (ref 4.22–5.81)
RDW: 13.9 % (ref 11.5–15.5)
WBC: 8 10*3/uL (ref 4.0–10.5)
nRBC: 0 % (ref 0.0–0.2)

## 2022-12-06 LAB — BASIC METABOLIC PANEL
Anion gap: 11 (ref 5–15)
BUN: 8 mg/dL (ref 6–20)
CO2: 23 mmol/L (ref 22–32)
Calcium: 9.5 mg/dL (ref 8.9–10.3)
Chloride: 105 mmol/L (ref 98–111)
Creatinine, Ser: 0.72 mg/dL (ref 0.61–1.24)
GFR, Estimated: >60 mL/min (ref >60)
Glucose, Bld: 101 mg/dL — ABNORMAL HIGH (ref 70–99)
Potassium: 3.5 mmol/L (ref 3.5–5.1)
Sodium: 139 mmol/L (ref 135–145)

## 2022-12-06 LAB — GLUCOSE, CAPILLARY: Glucose-Capillary: 114 mg/dL — ABNORMAL HIGH (ref 70–99)

## 2022-12-06 NOTE — Progress Notes (Signed)
Anesthesia note: Bowel prep reminder:  mag citrare  PCP - Dr. Maryellen Pile Cardiologist -Dr. Burna Forts Other-   Chest x-ray - 10/15/22 EKG - 10/15/22 Stress Test - no ECHO - 11/30/22 Cardiac Cath - 2015 CABG-no Pacemaker/ICD device last checked:no  Sleep Study - yes CPAP - yes No Xigduo after 12/11/22 CBG at PAT visit-114 Fasting Blood Sugar at home-80-165 Checks Blood Sugar __1/week___  Blood Thinner:no Blood Thinner Instructions: Aspirin Instructions: Last Dose:  Anesthesia review: Yes  reason:  Patient denies shortness of breath, fever, cough and chest pain at PAT appointment. Pt reports no SOB or pain today.   Patient verbalized understanding of instructions that were given to them at the PAT appointment. Patient was also instructed that they will need to review over the PAT instructions again at home before surgery.yes

## 2022-12-07 ENCOUNTER — Ambulatory Visit
Admission: RE | Admit: 2022-12-07 | Discharge: 2022-12-07 | Disposition: A | Discharge status: Home / Self Care | Payer: Federal, State, Local not specified - PPO | Source: Ambulatory Visit | Attending: Student in an Organized Health Care Education/Training Program | Admitting: Student in an Organized Health Care Education/Training Program

## 2022-12-07 DIAGNOSIS — R911 Solitary pulmonary nodule: Secondary | ICD-10-CM | POA: Insufficient documentation

## 2022-12-08 ENCOUNTER — Other Ambulatory Visit: Payer: Self-pay

## 2022-12-08 ENCOUNTER — Encounter (HOSPITAL_COMMUNITY): Payer: Self-pay | Admitting: Student in an Organized Health Care Education/Training Program

## 2022-12-08 NOTE — Anesthesia Preprocedure Evaluation (Signed)
Anesthesia Evaluation  Patient identified by MRN, date of birth, ID band Patient awake    Reviewed: Allergy & Precautions, NPO status , Patient's Chart, lab work & pertinent test results  Airway Mallampati: II  TM Distance: >3 FB Neck ROM: Full    Dental no notable dental hx.    Pulmonary sleep apnea  nodule   Pulmonary exam normal        Cardiovascular hypertension, Pt. on medications  Rhythm:Regular Rate:Normal     Neuro/Psych    Depression    negative neurological ROS     GI/Hepatic Neg liver ROS,GERD  Medicated,,  Endo/Other  diabetes    Renal/GU Renal disease     Musculoskeletal negative musculoskeletal ROS (+)    Abdominal Normal abdominal exam  (+)   Peds  Hematology  (+) Blood dyscrasia, anemia   Anesthesia Other Findings   Reproductive/Obstetrics                             Anesthesia Physical Anesthesia Plan  ASA: 2  Anesthesia Plan: General   Post-op Pain Management:    Induction: Intravenous  PONV Risk Score and Plan: 2 and Ondansetron, Dexamethasone, Midazolam and Treatment may vary due to age or medical condition  Airway Management Planned: Mask and Oral ETT  Additional Equipment: None  Intra-op Plan:   Post-operative Plan: Extubation in OR  Informed Consent: I have reviewed the patients History and Physical, chart, labs and discussed the procedure including the risks, benefits and alternatives for the proposed anesthesia with the patient or authorized representative who has indicated his/her understanding and acceptance.     Dental advisory given  Plan Discussed with: CRNA  Anesthesia Plan Comments:        Anesthesia Quick Evaluation

## 2022-12-08 NOTE — Progress Notes (Signed)
SDW call  Patient was given pre-op instructions over the phone. Patient verbalized understanding of instructions provided.    PCP - Dr. Kerby Nora Cardiologist - Dr. Eden Emms Pulmonary: denies Endocrinologist: Dr. Reather Littler   PPM/ICD - denies   Chest x-ray - 10/15/2022 EKG -  10/15/2022 Stress Test - ECHO - 11/30/2022 Cardiac Cath - 2015  Sleep Study/sleep apnea/CPAP: Diagnosed with sleep apnea. States he wears a CPAP most nights  Type II diabetic Fasting Blood sugar range: 104-173 How often check sugars: weekly Xigduo. Should have held 72 hours prior to surgery, last dose 12/08/2022   Blood Thinner Instructions: denies Aspirin Instructions:denies   ERAS Protcol - No, NPO PRE-SURGERY Ensure or G2-    COVID TEST- DOS, 12/09/2022, per MD order    Anesthesia review: Yes. DM, HTN, sleep apnea, nephrectomy scheduled 12/14/2022   Patient denies shortness of breath, fever, cough and chest pain over the phone call  Your procedure is scheduled on Thursday December 09, 2022  Report to [facility] Main Entrance "A" at 0530  A.M., then check in with the Admitting office.  Call this number if you have problems the morning of surgery:  [phone]   If you have any questions prior to your surgery date call [phone]: Open Monday-Friday 8am-4pm If you experience any cold or flu symptoms such as cough, fever, chills, shortness of breath, etc. between now and your scheduled surgery, please notify us at the above number    Remember:  Do not eat or drink after midnight the night before your surgery  Take these medicines the morning of surgery with A SIP OF WATER:  Lipitor, flonase, prilosec  As of today, STOP taking any Aspirin (unless otherwise instructed by your surgeon) Aleve, Naproxen, Ibuprofen, Motrin, Advil, Goody's, BC's, all herbal medications, fish oil, and all vitamins.

## 2022-12-08 NOTE — Progress Notes (Signed)
Patient does not need a covid test prior to bronchoscopy tomorrow. Order cancelled per verbal order.  

## 2022-12-09 ENCOUNTER — Ambulatory Visit (HOSPITAL_COMMUNITY): Payer: Federal, State, Local not specified - PPO | Admitting: Physician Assistant

## 2022-12-09 ENCOUNTER — Ambulatory Visit (HOSPITAL_COMMUNITY): Payer: Federal, State, Local not specified - PPO

## 2022-12-09 ENCOUNTER — Encounter (HOSPITAL_COMMUNITY): Payer: Self-pay | Admitting: Student in an Organized Health Care Education/Training Program

## 2022-12-09 ENCOUNTER — Encounter (HOSPITAL_COMMUNITY)
Admission: RE | Disposition: A | Discharge status: Home / Self Care | Payer: Self-pay | Source: Home / Self Care | Attending: Student in an Organized Health Care Education/Training Program

## 2022-12-09 ENCOUNTER — Ambulatory Visit (HOSPITAL_COMMUNITY)
Admission: RE | Admit: 2022-12-09 | Discharge: 2022-12-09 | Disposition: A | Discharge status: Home / Self Care | Payer: Federal, State, Local not specified - PPO | Attending: Student in an Organized Health Care Education/Training Program | Admitting: Student in an Organized Health Care Education/Training Program

## 2022-12-09 DIAGNOSIS — E119 Type 2 diabetes mellitus without complications: Secondary | ICD-10-CM | POA: Insufficient documentation

## 2022-12-09 DIAGNOSIS — I10 Essential (primary) hypertension: Secondary | ICD-10-CM | POA: Diagnosis not present

## 2022-12-09 DIAGNOSIS — R918 Other nonspecific abnormal finding of lung field: Secondary | ICD-10-CM | POA: Diagnosis not present

## 2022-12-09 DIAGNOSIS — K219 Gastro-esophageal reflux disease without esophagitis: Secondary | ICD-10-CM | POA: Diagnosis not present

## 2022-12-09 DIAGNOSIS — D759 Disease of blood and blood-forming organs, unspecified: Secondary | ICD-10-CM | POA: Diagnosis not present

## 2022-12-09 DIAGNOSIS — R053 Chronic cough: Secondary | ICD-10-CM | POA: Diagnosis not present

## 2022-12-09 DIAGNOSIS — G4730 Sleep apnea, unspecified: Secondary | ICD-10-CM | POA: Insufficient documentation

## 2022-12-09 DIAGNOSIS — N2889 Other specified disorders of kidney and ureter: Secondary | ICD-10-CM | POA: Diagnosis not present

## 2022-12-09 DIAGNOSIS — D649 Anemia, unspecified: Secondary | ICD-10-CM | POA: Diagnosis not present

## 2022-12-09 DIAGNOSIS — R911 Solitary pulmonary nodule: Secondary | ICD-10-CM | POA: Diagnosis not present

## 2022-12-09 HISTORY — PX: BRONCHIAL NEEDLE ASPIRATION BIOPSY: SHX5106

## 2022-12-09 HISTORY — PX: BRONCHIAL BIOPSY: SHX5109

## 2022-12-09 LAB — GLUCOSE, CAPILLARY
Glucose-Capillary: 107 mg/dL — ABNORMAL HIGH (ref 70–99)
Glucose-Capillary: 86 mg/dL (ref 70–99)

## 2022-12-09 SURGERY — BRONCHOSCOPY, WITH BIOPSY USING ELECTROMAGNETIC NAVIGATION
Anesthesia: General | Laterality: Bilateral

## 2022-12-09 MED ORDER — FENTANYL CITRATE (PF) 100 MCG/2ML IJ SOLN: 25.0000 ug | INTRAMUSCULAR | Status: DC | PRN

## 2022-12-09 MED ORDER — ROCURONIUM BROMIDE 10 MG/ML (PF) SYRINGE
PREFILLED_SYRINGE | INTRAVENOUS | Status: DC | PRN
  Administered 2022-12-09: 50 mg via INTRAVENOUS

## 2022-12-09 MED ORDER — LACTATED RINGERS IV SOLN: INTRAVENOUS | Status: DC | PRN

## 2022-12-09 MED ORDER — SUGAMMADEX SODIUM 200 MG/2ML IV SOLN
INTRAVENOUS | Status: DC | PRN
  Administered 2022-12-09: 100 mg via INTRAVENOUS
  Administered 2022-12-09: 200 mg via INTRAVENOUS

## 2022-12-09 MED ORDER — ACETAMINOPHEN 10 MG/ML IV SOLN: 1000.0000 mg | Freq: Once | INTRAVENOUS | Status: DC | PRN

## 2022-12-09 MED ORDER — DEXAMETHASONE SODIUM PHOSPHATE 10 MG/ML IJ SOLN
INTRAMUSCULAR | Status: DC | PRN
  Administered 2022-12-09: 10 mg via INTRAVENOUS

## 2022-12-09 MED ORDER — MIDAZOLAM HCL 2 MG/2ML IJ SOLN
INTRAMUSCULAR | Status: DC | PRN
  Administered 2022-12-09: 2 mg via INTRAVENOUS

## 2022-12-09 MED ORDER — ONDANSETRON HCL 4 MG/2ML IJ SOLN
INTRAMUSCULAR | Status: DC | PRN
  Administered 2022-12-09: 4 mg via INTRAVENOUS

## 2022-12-09 MED ORDER — LIDOCAINE 2% (20 MG/ML) 5 ML SYRINGE
INTRAMUSCULAR | Status: DC | PRN
  Administered 2022-12-09: 60 mg via INTRAVENOUS

## 2022-12-09 MED ORDER — FENTANYL CITRATE (PF) 250 MCG/5ML IJ SOLN
INTRAMUSCULAR | Status: DC | PRN
  Administered 2022-12-09: 100 ug via INTRAVENOUS

## 2022-12-09 MED ORDER — SUCCINYLCHOLINE CHLORIDE 200 MG/10ML IV SOSY
PREFILLED_SYRINGE | INTRAVENOUS | Status: DC | PRN
  Administered 2022-12-09: 120 mg via INTRAVENOUS

## 2022-12-09 MED ORDER — PHENYLEPHRINE 80 MCG/ML (10ML) SYRINGE FOR IV PUSH (FOR BLOOD PRESSURE SUPPORT)
PREFILLED_SYRINGE | INTRAVENOUS | Status: DC | PRN
  Administered 2022-12-09: 160 ug via INTRAVENOUS

## 2022-12-09 MED ORDER — PROPOFOL 1000 MG/100ML IV EMUL
INTRAVENOUS | Status: AC
  Filled 2022-12-09: qty 300

## 2022-12-09 MED ORDER — PROPOFOL 10 MG/ML IV BOLUS
INTRAVENOUS | Status: DC | PRN
  Administered 2022-12-09: 150 ug/kg/min via INTRAVENOUS
  Administered 2022-12-09: 30 mg via INTRAVENOUS
  Administered 2022-12-09: 110 mg via INTRAVENOUS

## 2022-12-09 MED ORDER — CHLORHEXIDINE GLUCONATE 0.12 % MT SOLN
15.0000 mL | Freq: Once | OROMUCOSAL | Status: AC
  Administered 2022-12-09: 15 mL via OROMUCOSAL
  Filled 2022-12-09 (×2): qty 15

## 2022-12-09 NOTE — Transfer of Care (Signed)
Immediate Anesthesia Transfer of Care Note  Patient: Paul Flowers  Procedure(s) Performed: ROBOTIC ASSISTED NAVIGATIONAL BRONCHOSCOPY (Bilateral) BRONCHIAL NEEDLE ASPIRATION BIOPSIES BRONCHIAL BIOPSIES  Patient Location: PACU  Anesthesia Type:General  Level of Consciousness: awake, alert , and oriented  Airway & Oxygen Therapy: Patient Spontanous Breathing and Patient connected to face mask oxygen  Post-op Assessment: Report given to RN and Post -op Vital signs reviewed and stable  Post vital signs: Reviewed and stable  Last Vitals:  Vitals Value Taken Time  BP 123/86 12/09/22 0918  Temp 36.9 C 12/09/22 0915  Pulse 103 12/09/22 0928  Resp 17 12/09/22 0928  SpO2 95 % 12/09/22 0928  Vitals shown include unvalidated device data.  Last Pain:  Vitals:   12/09/22 0607  TempSrc: Oral         Complications: No notable events documented.

## 2022-12-09 NOTE — Anesthesia Procedure Notes (Signed)
Procedure Name: Intubation Date/Time: 12/09/2022 8:12 AM  Performed by: Alvera Novel, CRNAPre-anesthesia Checklist: Patient identified, Emergency Drugs available, Suction available and Patient being monitored Patient Re-evaluated:Patient Re-evaluated prior to induction Oxygen Delivery Method: Circle System Utilized Preoxygenation: Pre-oxygenation with 100% oxygen Induction Type: IV induction Ventilation: Mask ventilation without difficulty Laryngoscope Size: Mac and 4 Grade View: Grade II Tube type: Oral Tube size: 8.5 mm Number of attempts: 1 Airway Equipment and Method: Stylet and Oral airway Placement Confirmation: ETT inserted through vocal cords under direct vision, positive ETCO2 and breath sounds checked- equal and bilateral Secured at: 22 cm Tube secured with: Tape Dental Injury: Teeth and Oropharynx as per pre-operative assessment

## 2022-12-09 NOTE — Anesthesia Postprocedure Evaluation (Signed)
Anesthesia Post Note  Patient: Paul Flowers  Procedure(s) Performed: ROBOTIC ASSISTED NAVIGATIONAL BRONCHOSCOPY (Bilateral) BRONCHIAL NEEDLE ASPIRATION BIOPSIES BRONCHIAL BIOPSIES     Patient location during evaluation: PACU Anesthesia Type: General Level of consciousness: awake and alert Pain management: pain level controlled Vital Signs Assessment: post-procedure vital signs reviewed and stable Respiratory status: spontaneous breathing, nonlabored ventilation, respiratory function stable and patient connected to nasal cannula oxygen Cardiovascular status: blood pressure returned to baseline and stable Postop Assessment: no apparent nausea or vomiting Anesthetic complications: no   No notable events documented.  Last Vitals:  Vitals:   12/09/22 0930 12/09/22 0945  BP: (!) 126/94 (!) 129/91  Pulse: (!) 105 (!) 105  Resp: 15 (!) 22  Temp:  37 C  SpO2: 96% 96%    Last Pain:  Vitals:   12/09/22 0945  TempSrc:   PainSc: 0-No pain                 Earl Lites P Amberly Livas

## 2022-12-09 NOTE — Op Note (Signed)
Video Bronchoscopy with Robotic Assisted Bronchoscopic Navigation   Date of Operation: 12/09/2022   Pre-op Diagnosis: lung nodule  Surgeon: Raechel Chute, MD  Anesthesia: General endotracheal anesthesia  Operation: Flexible video fiberoptic bronchoscopy with robotic assistance and biopsies.  Estimated Blood Loss: Minimal  Complications: None  Indications and History: Paul Flowers is a 52 y.o. male with history of newly diagnosed renal mass and small pulmonary nodule presenting for robotic assisted navigational bronchoscopy. The risks, benefits, complications, treatment options and expected outcomes were discussed with the patient.  The possibilities of pneumothorax, pneumonia, reaction to medication, pulmonary aspiration, perforation of a viscus, bleeding, failure to diagnose a condition and creating a complication requiring transfusion or operation were discussed with the patient who freely signed the consent.    Description of Procedure: The patient was seen in the Preoperative Area, was examined and was deemed appropriate to proceed.  The patient was taken to [facility] endoscopy room 3, identified as Paul Flowers and the procedure verified as Flexible Video Fiberoptic Bronchoscopy.  A Time Out was held and the above information confirmed.   Prior to the date of the procedure a high-resolution CT scan of the chest was performed. Utilizing ION software program a virtual tracheobronchial tree was generated to allow the creation of distinct navigation pathways to the patient's parenchymal abnormalities. After being taken to the operating room general anesthesia was initiated and the patient  was orally intubated. The video fiberoptic bronchoscope was introduced via the endotracheal tube and a general inspection was performed which showed normal right and left lung anatomy, aspiration of the bilateral mainstems was completed to remove any remaining secretions. Robotic catheter inserted into patient's  endotracheal tube.   Target #1 LLL: The distinct navigation pathways prepared prior to this procedure were then utilized to navigate to patient's lesion identified on CT scan. The robotic catheter was secured into place and the vision probe was withdrawn.  Lesion location was approximated using fluoroscopy and the CIOS cone beam system. There was a good degree of atelectasis in the left base. Under fluoroscopic guidance transbronchial needle biopsies, and transbronchial forceps biopsies were performed to be sent for cytology.  At the end of the procedure a general airway inspection was performed and there was no evidence of active bleeding. The bronchoscope was removed.  The patient tolerated the procedure well. There was no significant blood loss and there were no obvious complications. A post-procedural chest x-ray is pending.  Samples Target #1: 1. Transbronchial needle biopsy from LLL nodule 2. Transbronchial forceps biopsies from LLL nodule  Plans:  The patient will be discharged from the PACU to home when recovered from anesthesia and after chest x-ray is reviewed. We will review the cytology, pathology and microbiology results with the patient when they become available. Outpatient followup will be with Dr. Alena Bills.   Raechel Chute, MD [facility] 12/09/2022 8:58 AM

## 2022-12-09 NOTE — Interval H&P Note (Signed)
S: Feels well, no chest pain or shortness of breath. Cough persists.  O: Vitals:   12/09/22 0607  BP: 127/78  Pulse: 97  Resp: 16  Temp: 99.1 F (37.3 C)  SpO2: 97%   General: Well-appearing and in no distress. Well-nourished Eyes: Anicteric, no conjunctival pallor HEENT: Mucous membranes moist, no evidence of postnasal drip Lymphadenopathy: No cervical or supraclavicular adenopathy Respiratory: Trachea is midline, no respiratory distress, good bilateral air entry, no wheezes, rales, or rhonchi Cardiovascular: Heart with regular rate and rhythm, normal S1 and S2, no murmurs, rubs, or gallops Gastrointestinal: Normoactive bowel sounds, soft and nontender Musculoskeletal: No clubbing or digital cyanosis, normal range of motion Skin: No rashes on limited exam Neuro: Alert and oriented, no gross focal deficits  A/P:  52 year old male presents with a kidney mass as well as a lung nodule. Plan for robotic assisted navigational bronchoscopy for FNA of the nodule. Patient is appropriate for the procedure.  Raechel Chute, MD [facility] 12/09/2022 7:44 AM

## 2022-12-09 NOTE — Progress Notes (Signed)
Dr. Nance Pew notified that took Xigduo XR yesterday. He will discuss with  Dr. Aundria Rud.

## 2022-12-10 ENCOUNTER — Telehealth: Payer: Self-pay | Admitting: *Deleted

## 2022-12-10 ENCOUNTER — Encounter: Payer: Self-pay | Admitting: Internal Medicine

## 2022-12-10 ENCOUNTER — Inpatient Hospital Stay: Payer: Federal, State, Local not specified - PPO | Admitting: Internal Medicine

## 2022-12-10 LAB — CYTOLOGY - NON PAP
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
## 2022-12-10 NOTE — Telephone Encounter (Signed)
Form signed, copied for chart and fr patient to have a copy and then original put in mail in envelope supplied by patient  Patient sent MyChart message that this was done and he can pick up his copy at [facility]

## 2022-12-10 NOTE — Telephone Encounter (Signed)
Received FMLA form for patient from completed and sent for doctor signature

## 2022-12-10 NOTE — Anesthesia Preprocedure Evaluation (Signed)
Anesthesia Evaluation  Patient identified by MRN, date of birth, ID band Patient awake    Reviewed: Allergy & Precautions, H&P , NPO status , Patient's Chart, lab work & pertinent test results  Airway Mallampati: II  TM Distance: >3 FB Neck ROM: Full    Dental no notable dental hx. (+) Dental Advisory Given   Pulmonary sleep apnea    Pulmonary exam normal breath sounds clear to auscultation       Cardiovascular hypertension, Pt. on medications Normal cardiovascular exam Rhythm:Regular Rate:Normal     Neuro/Psych    Depression    negative neurological ROS  negative psych ROS   GI/Hepatic Neg liver ROS,GERD  ,,  Endo/Other  diabetes    Renal/GU Renal disease  negative genitourinary   Musculoskeletal negative musculoskeletal ROS (+)    Abdominal   Peds negative pediatric ROS (+)  Hematology  (+) Blood dyscrasia, anemia   Anesthesia Other Findings   Reproductive/Obstetrics negative OB ROS                              Anesthesia Physical Anesthesia Plan  ASA: 3  Anesthesia Plan: General   Post-op Pain Management: Dilaudid IV   Induction: Intravenous  PONV Risk Score and Plan: 2 and Ondansetron, Midazolam and Treatment may vary due to age or medical condition  Airway Management Planned: Oral ETT  Additional Equipment:   Intra-op Plan:   Post-operative Plan: Extubation in OR  Informed Consent: I have reviewed the patients History and Physical, chart, labs and discussed the procedure including the risks, benefits and alternatives for the proposed anesthesia with the patient or authorized representative who has indicated his/her understanding and acceptance.     Dental advisory given  Plan Discussed with: CRNA  Anesthesia Plan Comments: (See PAT note 12/10/2022)        Anesthesia Quick Evaluation

## 2022-12-12 ENCOUNTER — Encounter (HOSPITAL_COMMUNITY): Payer: Self-pay | Admitting: Student in an Organized Health Care Education/Training Program

## 2022-12-13 ENCOUNTER — Inpatient Hospital Stay: Payer: Federal, State, Local not specified - PPO | Attending: Internal Medicine

## 2022-12-13 NOTE — Progress Notes (Signed)
[facility] Clinical Social Work  Clinical Social Work was referred by medical provider for assessment of psychosocial needs.  Clinical Social Worker contacted patient by phone to offer support and assess for needs.    Patient stated that all of his needs are met at this time.  He confirmed he worked for the [employer].  He reported doing well and had no questions at this time.     Dorothey Baseman, LCSW  Clinical Social Worker [facility]

## 2022-12-14 ENCOUNTER — Other Ambulatory Visit: Payer: Self-pay | Admitting: Endocrinology

## 2022-12-15 ENCOUNTER — Inpatient Hospital Stay (HOSPITAL_COMMUNITY): Payer: Federal, State, Local not specified - PPO | Admitting: Physician Assistant

## 2022-12-15 ENCOUNTER — Inpatient Hospital Stay (HOSPITAL_COMMUNITY)
Admission: RE | Admit: 2022-12-15 | Discharge: 2022-12-17 | DRG: 657 | Disposition: A | Discharge status: Home / Self Care | Payer: Federal, State, Local not specified - PPO | Attending: Urology | Admitting: Urology

## 2022-12-15 ENCOUNTER — Other Ambulatory Visit: Payer: Self-pay

## 2022-12-15 ENCOUNTER — Encounter (HOSPITAL_COMMUNITY)
Admission: RE | Disposition: A | Discharge status: Home / Self Care | Payer: Self-pay | Source: Home / Self Care | Attending: Urology

## 2022-12-15 ENCOUNTER — Encounter (HOSPITAL_COMMUNITY): Payer: Self-pay | Admitting: Urology

## 2022-12-15 DIAGNOSIS — G4730 Sleep apnea, unspecified: Secondary | ICD-10-CM | POA: Diagnosis present

## 2022-12-15 DIAGNOSIS — D49512 Neoplasm of unspecified behavior of left kidney: Secondary | ICD-10-CM | POA: Diagnosis not present

## 2022-12-15 DIAGNOSIS — Z833 Family history of diabetes mellitus: Secondary | ICD-10-CM

## 2022-12-15 DIAGNOSIS — R590 Localized enlarged lymph nodes: Secondary | ICD-10-CM | POA: Diagnosis present

## 2022-12-15 DIAGNOSIS — K219 Gastro-esophageal reflux disease without esophagitis: Secondary | ICD-10-CM | POA: Diagnosis present

## 2022-12-15 DIAGNOSIS — I10 Essential (primary) hypertension: Secondary | ICD-10-CM | POA: Diagnosis present

## 2022-12-15 DIAGNOSIS — R911 Solitary pulmonary nodule: Secondary | ICD-10-CM | POA: Diagnosis present

## 2022-12-15 DIAGNOSIS — Z8249 Family history of ischemic heart disease and other diseases of the circulatory system: Secondary | ICD-10-CM

## 2022-12-15 DIAGNOSIS — I82220 Acute embolism and thrombosis of inferior vena cava: Secondary | ICD-10-CM | POA: Diagnosis not present

## 2022-12-15 DIAGNOSIS — Z87442 Personal history of urinary calculi: Secondary | ICD-10-CM | POA: Diagnosis not present

## 2022-12-15 DIAGNOSIS — C772 Secondary and unspecified malignant neoplasm of intra-abdominal lymph nodes: Secondary | ICD-10-CM | POA: Diagnosis not present

## 2022-12-15 DIAGNOSIS — E119 Type 2 diabetes mellitus without complications: Secondary | ICD-10-CM | POA: Diagnosis not present

## 2022-12-15 DIAGNOSIS — N2889 Other specified disorders of kidney and ureter: Secondary | ICD-10-CM | POA: Diagnosis not present

## 2022-12-15 DIAGNOSIS — N280 Ischemia and infarction of kidney: Secondary | ICD-10-CM | POA: Diagnosis not present

## 2022-12-15 DIAGNOSIS — C642 Malignant neoplasm of left kidney, except renal pelvis: Secondary | ICD-10-CM | POA: Diagnosis not present

## 2022-12-15 DIAGNOSIS — D630 Anemia in neoplastic disease: Secondary | ICD-10-CM | POA: Diagnosis not present

## 2022-12-15 HISTORY — PX: ROBOT ASSISTED LAPAROSCOPIC NEPHRECTOMY: SHX5140

## 2022-12-15 LAB — GLUCOSE, CAPILLARY
Glucose-Capillary: 95 mg/dL (ref 70–99)
Glucose-Capillary: 145 mg/dL — ABNORMAL HIGH (ref 70–99)

## 2022-12-15 LAB — TYPE AND SCREEN
ABO/RH(D): O POS
Unit division: 0

## 2022-12-15 LAB — BPAM RBC: Blood Product Expiration Date: 202407122359

## 2022-12-15 LAB — BASIC METABOLIC PANEL
Anion gap: 9 (ref 5–15)
BUN: 8 mg/dL (ref 6–20)
CO2: 27 mmol/L (ref 22–32)
Calcium: 8.6 mg/dL — ABNORMAL LOW (ref 8.9–10.3)
Chloride: 103 mmol/L (ref 98–111)
Creatinine, Ser: 0.8 mg/dL (ref 0.61–1.24)
GFR, Estimated: >60 mL/min (ref >60)
Glucose, Bld: 157 mg/dL — ABNORMAL HIGH (ref 70–99)
Potassium: 3.8 mmol/L (ref 3.5–5.1)
Sodium: 139 mmol/L (ref 135–145)

## 2022-12-15 LAB — HEMOGLOBIN AND HEMATOCRIT, BLOOD
HCT: 29.4 % — ABNORMAL LOW (ref 39.0–52.0)
Hemoglobin: 8.7 g/dL — ABNORMAL LOW (ref 13.0–17.0)

## 2022-12-15 LAB — PREPARE RBC (CROSSMATCH)

## 2022-12-15 LAB — ABO/RH: ABO/RH(D): O POS

## 2022-12-15 SURGERY — NEPHRECTOMY, RADICAL, ROBOT-ASSISTED, LAPAROSCOPIC, ADULT
Anesthesia: General | Site: Abdomen | Laterality: Left

## 2022-12-15 MED ORDER — HYDROMORPHONE HCL 1 MG/ML IJ SOLN
0.5000 mg | INTRAMUSCULAR | Status: DC | PRN
  Administered 2022-12-15: 1 mg via INTRAVENOUS
  Administered 2022-12-16: 0.5 mg via INTRAVENOUS
  Administered 2022-12-16 – 2022-12-17 (×2): 1 mg via INTRAVENOUS
  Filled 2022-12-15 (×4): qty 1

## 2022-12-15 MED ORDER — HYDROMORPHONE HCL 1 MG/ML IJ SOLN
INTRAMUSCULAR | Status: AC
  Administered 2022-12-15: 0.25 mg via INTRAVENOUS
  Filled 2022-12-15: qty 1

## 2022-12-15 MED ORDER — ROCURONIUM BROMIDE 10 MG/ML (PF) SYRINGE
PREFILLED_SYRINGE | INTRAVENOUS | Status: AC
  Filled 2022-12-15: qty 10

## 2022-12-15 MED ORDER — FENTANYL CITRATE (PF) 250 MCG/5ML IJ SOLN
INTRAMUSCULAR | Status: AC
  Filled 2022-12-15: qty 5

## 2022-12-15 MED ORDER — DIPHENHYDRAMINE HCL 50 MG/ML IJ SOLN: 12.5000 mg | Freq: Four times a day (QID) | INTRAMUSCULAR | Status: DC | PRN

## 2022-12-15 MED ORDER — ACETAMINOPHEN 10 MG/ML IV SOLN
INTRAVENOUS | Status: AC
  Filled 2022-12-15: qty 100

## 2022-12-15 MED ORDER — HYDROCODONE-ACETAMINOPHEN 5-325 MG PO TABS: 1.0000 | ORAL_TABLET | Freq: Four times a day (QID) | ORAL | 0 refills | Status: DC | PRN

## 2022-12-15 MED ORDER — DOCUSATE SODIUM 100 MG PO CAPS
100.0000 mg | ORAL_CAPSULE | Freq: Two times a day (BID) | ORAL | Status: DC
  Administered 2022-12-15 – 2022-12-17 (×4): 100 mg via ORAL
  Filled 2022-12-15 (×4): qty 1

## 2022-12-15 MED ORDER — LACTATED RINGERS IV SOLN: INTRAVENOUS | Status: DC | PRN

## 2022-12-15 MED ORDER — CEFAZOLIN SODIUM-DEXTROSE 2-4 GM/100ML-% IV SOLN
2.0000 g | INTRAVENOUS | Status: AC
  Administered 2022-12-15: 2 g via INTRAVENOUS
  Filled 2022-12-15: qty 100

## 2022-12-15 MED ORDER — CEFAZOLIN SODIUM-DEXTROSE 1-4 GM/50ML-% IV SOLN: 1.0000 g | Freq: Three times a day (TID) | INTRAVENOUS | Status: DC

## 2022-12-15 MED ORDER — PROPOFOL 10 MG/ML IV BOLUS
INTRAVENOUS | Status: DC | PRN
  Administered 2022-12-15: 150 mg via INTRAVENOUS

## 2022-12-15 MED ORDER — SODIUM CHLORIDE (PF) 0.9 % IJ SOLN
INTRAMUSCULAR | Status: AC
  Filled 2022-12-15: qty 20

## 2022-12-15 MED ORDER — ONDANSETRON HCL 4 MG/2ML IJ SOLN
INTRAMUSCULAR | Status: DC | PRN
  Administered 2022-12-15: 4 mg via INTRAVENOUS

## 2022-12-15 MED ORDER — DEXAMETHASONE SODIUM PHOSPHATE 10 MG/ML IJ SOLN
INTRAMUSCULAR | Status: DC | PRN
  Administered 2022-12-15: 10 mg via INTRAVENOUS

## 2022-12-15 MED ORDER — METOPROLOL TARTRATE 5 MG/5ML IV SOLN
INTRAVENOUS | Status: AC
  Filled 2022-12-15: qty 5

## 2022-12-15 MED ORDER — PROMETHAZINE HCL 25 MG/ML IJ SOLN: 6.2500 mg | INTRAMUSCULAR | Status: DC | PRN

## 2022-12-15 MED ORDER — DEXAMETHASONE SODIUM PHOSPHATE 10 MG/ML IJ SOLN
INTRAMUSCULAR | Status: AC
  Filled 2022-12-15: qty 1

## 2022-12-15 MED ORDER — ESMOLOL HCL 100 MG/10ML IV SOLN
INTRAVENOUS | Status: AC
  Filled 2022-12-15: qty 10

## 2022-12-15 MED ORDER — STERILE WATER FOR IRRIGATION IR SOLN
Status: DC | PRN
  Administered 2022-12-15: 1000 mL

## 2022-12-15 MED ORDER — MIDAZOLAM HCL 2 MG/2ML IJ SOLN
INTRAMUSCULAR | Status: AC
  Filled 2022-12-15: qty 2

## 2022-12-15 MED ORDER — ACETAMINOPHEN 10 MG/ML IV SOLN
INTRAVENOUS | Status: DC | PRN
  Administered 2022-12-15: 1000 mg via INTRAVENOUS

## 2022-12-15 MED ORDER — ONDANSETRON HCL 4 MG/2ML IJ SOLN: 4.0000 mg | INTRAMUSCULAR | Status: DC | PRN

## 2022-12-15 MED ORDER — BUPIVACAINE LIPOSOME 1.3 % IJ SUSP
INTRAMUSCULAR | Status: AC
  Filled 2022-12-15: qty 20

## 2022-12-15 MED ORDER — SUGAMMADEX SODIUM 200 MG/2ML IV SOLN
INTRAVENOUS | Status: DC | PRN
  Administered 2022-12-15: 200 mg via INTRAVENOUS

## 2022-12-15 MED ORDER — METOPROLOL TARTRATE 5 MG/5ML IV SOLN
INTRAVENOUS | Status: DC | PRN
  Administered 2022-12-15 (×2): 1 mg via INTRAVENOUS

## 2022-12-15 MED ORDER — PANTOPRAZOLE SODIUM 40 MG PO TBEC
40.0000 mg | DELAYED_RELEASE_TABLET | Freq: Every day | ORAL | Status: DC
  Administered 2022-12-15 – 2022-12-17 (×3): 40 mg via ORAL
  Filled 2022-12-15 (×3): qty 1

## 2022-12-15 MED ORDER — CEFAZOLIN SODIUM-DEXTROSE 1-4 GM/50ML-% IV SOLN
1.0000 g | Freq: Three times a day (TID) | INTRAVENOUS | Status: AC
  Administered 2022-12-15 – 2022-12-16 (×2): 1 g via INTRAVENOUS
  Filled 2022-12-15 (×2): qty 50

## 2022-12-15 MED ORDER — AMISULPRIDE (ANTIEMETIC) 5 MG/2ML IV SOLN: 10.0000 mg | Freq: Once | INTRAVENOUS | Status: DC | PRN

## 2022-12-15 MED ORDER — HYDROMORPHONE HCL 1 MG/ML IJ SOLN
0.2500 mg | INTRAMUSCULAR | Status: DC | PRN
  Administered 2022-12-15: 0.25 mg via INTRAVENOUS

## 2022-12-15 MED ORDER — MIDAZOLAM HCL 5 MG/5ML IJ SOLN
INTRAMUSCULAR | Status: DC | PRN
  Administered 2022-12-15: 2 mg via INTRAVENOUS

## 2022-12-15 MED ORDER — LIDOCAINE 2% (20 MG/ML) 5 ML SYRINGE
INTRAMUSCULAR | Status: DC | PRN
  Administered 2022-12-15: 60 mg via INTRAVENOUS

## 2022-12-15 MED ORDER — OXYCODONE HCL 5 MG PO TABS: 5.0000 mg | ORAL_TABLET | Freq: Once | ORAL | Status: DC | PRN

## 2022-12-15 MED ORDER — LACTATED RINGERS IV SOLN: INTRAVENOUS | Status: DC

## 2022-12-15 MED ORDER — FENTANYL CITRATE (PF) 250 MCG/5ML IJ SOLN
INTRAMUSCULAR | Status: DC | PRN
  Administered 2022-12-15: 100 ug via INTRAVENOUS
  Administered 2022-12-15 (×3): 50 ug via INTRAVENOUS
  Administered 2022-12-15: 100 ug via INTRAVENOUS
  Administered 2022-12-15 (×3): 50 ug via INTRAVENOUS

## 2022-12-15 MED ORDER — BUPIVACAINE LIPOSOME 1.3 % IJ SUSP
INTRAMUSCULAR | Status: DC | PRN
  Administered 2022-12-15: 20 mL

## 2022-12-15 MED ORDER — OXYCODONE HCL 5 MG/5ML PO SOLN: 5.0000 mg | Freq: Once | ORAL | Status: DC | PRN

## 2022-12-15 MED ORDER — CHLORHEXIDINE GLUCONATE 0.12 % MT SOLN: 15.0000 mL | Freq: Once | OROMUCOSAL | Status: DC

## 2022-12-15 MED ORDER — ROCURONIUM BROMIDE 10 MG/ML (PF) SYRINGE
PREFILLED_SYRINGE | INTRAVENOUS | Status: DC | PRN
  Administered 2022-12-15: 20 mg via INTRAVENOUS
  Administered 2022-12-15: 80 mg via INTRAVENOUS
  Administered 2022-12-15: 10 mg via INTRAVENOUS

## 2022-12-15 MED ORDER — LIDOCAINE HCL (PF) 2 % IJ SOLN
INTRAMUSCULAR | Status: AC
  Filled 2022-12-15: qty 5

## 2022-12-15 MED ORDER — ONDANSETRON HCL 4 MG/2ML IJ SOLN
INTRAMUSCULAR | Status: AC
  Filled 2022-12-15: qty 2

## 2022-12-15 MED ORDER — ATORVASTATIN CALCIUM 20 MG PO TABS
20.0000 mg | ORAL_TABLET | Freq: Every day | ORAL | Status: DC
  Administered 2022-12-16 – 2022-12-17 (×2): 20 mg via ORAL
  Filled 2022-12-15 (×2): qty 1

## 2022-12-15 MED ORDER — DOCUSATE SODIUM 100 MG PO CAPS: 100.0000 mg | ORAL_CAPSULE | Freq: Two times a day (BID) | ORAL | Status: DC

## 2022-12-15 MED ORDER — DEXTROSE-SODIUM CHLORIDE 5-0.45 % IV SOLN: INTRAVENOUS | Status: DC

## 2022-12-15 MED ORDER — DIPHENHYDRAMINE HCL 12.5 MG/5ML PO ELIX: 12.5000 mg | ORAL_SOLUTION | Freq: Four times a day (QID) | ORAL | Status: DC | PRN

## 2022-12-15 MED ORDER — ESMOLOL HCL 100 MG/10ML IV SOLN
INTRAVENOUS | Status: DC | PRN
  Administered 2022-12-15 (×2): 20 mg via INTRAVENOUS

## 2022-12-15 MED ORDER — PROPOFOL 10 MG/ML IV BOLUS
INTRAVENOUS | Status: AC
  Filled 2022-12-15: qty 20

## 2022-12-15 MED ORDER — SODIUM CHLORIDE (PF) 0.9 % IJ SOLN
INTRAMUSCULAR | Status: DC | PRN
  Administered 2022-12-15: 20 mL

## 2022-12-15 MED ORDER — LACTATED RINGERS IR SOLN
Status: DC | PRN
  Administered 2022-12-15: 1000 mL

## 2022-12-15 MED ORDER — ORAL CARE MOUTH RINSE: 15.0000 mL | Freq: Once | OROMUCOSAL | Status: DC

## 2022-12-15 MED ORDER — MAGNESIUM CITRATE PO SOLN: 1.0000 | Freq: Once | ORAL | Status: DC

## 2022-12-15 SURGICAL SUPPLY — 65 items
ADH SKN CLS APL DERMABOND .7 (GAUZE/BANDAGES/DRESSINGS) ×1
APL PRP STRL LF DISP 70% ISPRP (MISCELLANEOUS) ×1
BAG COUNTER SPONGE SURGICOUNT (BAG) ×2 IMPLANT
BAG LAPAROSCOPIC 12 15 PORT 16 (BASKET) ×2 IMPLANT
BAG RETRIEVAL 12/15 (BASKET) ×1
BAG SPNG CNTER NS LX DISP (BAG) ×1
CHLORAPREP W/TINT 26 (MISCELLANEOUS) ×2 IMPLANT
CLIP LIGATING HEM O LOK PURPLE (MISCELLANEOUS) ×2 IMPLANT
CLIP LIGATING HEMO LOK XL GOLD (MISCELLANEOUS) ×2 IMPLANT
CLIP LIGATING HEMO O LOK GREEN (MISCELLANEOUS) ×2 IMPLANT
COVER SURGICAL LIGHT HANDLE (MISCELLANEOUS) ×2 IMPLANT
COVER TIP SHEARS 8 DVNC (MISCELLANEOUS) ×2 IMPLANT
CUTTER ECHEON FLEX ENDO 45 340 (ENDOMECHANICALS) IMPLANT
DERMABOND ADVANCED .7 DNX12 (GAUZE/BANDAGES/DRESSINGS) ×4 IMPLANT
DRAIN CHANNEL 15F RND FF 3/16 (WOUND CARE) IMPLANT
DRAPE ARM DVNC X/XI (DISPOSABLE) ×8 IMPLANT
DRAPE COLUMN DVNC XI (DISPOSABLE) ×2 IMPLANT
DRAPE INCISE IOBAN 66X45 STRL (DRAPES) ×2 IMPLANT
DRAPE SHEET LG 3/4 BI-LAMINATE (DRAPES) ×2 IMPLANT
DRIVER NDL LRG 8 DVNC XI (INSTRUMENTS) ×4 IMPLANT
DRIVER NDLE LRG 8 DVNC XI (INSTRUMENTS) ×2 IMPLANT
ELECT PENCIL ROCKER SW 15FT (MISCELLANEOUS) ×2 IMPLANT
ELECT REM PT RETURN 15FT ADLT (MISCELLANEOUS) ×2 IMPLANT
EVACUATOR SILICONE 100CC (DRAIN) IMPLANT
FORCEPS BPLR FENES DVNC XI (FORCEP) ×2 IMPLANT
FORCEPS PROGRASP DVNC XI (FORCEP) ×2 IMPLANT
GLOVE BIO SURGEON STRL SZ 6.5 (GLOVE) ×2 IMPLANT
GLOVE SURG LX STRL 7.5 STRW (GLOVE) ×4 IMPLANT
GOWN SRG XL LVL 4 BRTHBL STRL (GOWNS) ×2 IMPLANT
GOWN STRL NON-REIN XL LVL4 (GOWNS) ×1
GOWN STRL REUS W/ TWL XL LVL3 (GOWN DISPOSABLE) ×4 IMPLANT
GOWN STRL REUS W/TWL XL LVL3 (GOWN DISPOSABLE) ×2
HOLDER FOLEY CATH W/STRAP (MISCELLANEOUS) ×2 IMPLANT
IRRIG SUCT STRYKERFLOW 2 WTIP (MISCELLANEOUS) ×1
IRRIGATION SUCT STRKRFLW 2 WTP (MISCELLANEOUS) ×2 IMPLANT
KIT BASIN OR (CUSTOM PROCEDURE TRAY) ×2 IMPLANT
KIT TURNOVER KIT A (KITS) IMPLANT
LOOP VESSEL MAXI BLUE (MISCELLANEOUS) IMPLANT
NDL INSUFFLATION 14GA 120MM (NEEDLE) ×2 IMPLANT
NEEDLE INSUFFLATION 14GA 120MM (NEEDLE) ×1 IMPLANT
PORT ACCESS TROCAR AIRSEAL 12 (TROCAR) ×2 IMPLANT
PROTECTOR NERVE ULNAR (MISCELLANEOUS) ×4 IMPLANT
RELOAD STAPLE 45 2.6 WHT THIN (STAPLE) IMPLANT
SCISSORS MNPLR CVD DVNC XI (INSTRUMENTS) ×2 IMPLANT
SEAL UNIV 5-12 XI (MISCELLANEOUS) ×6 IMPLANT
SET TRI-LUMEN FLTR TB AIRSEAL (TUBING) ×2 IMPLANT
SOL ELECTROSURG ANTI STICK (MISCELLANEOUS) ×1
SOLUTION ELECTROSURG ANTI STCK (MISCELLANEOUS) ×2 IMPLANT
SPIKE FLUID TRANSFER (MISCELLANEOUS) ×2 IMPLANT
SPONGE T-LAP 4X18 ~~LOC~~+RFID (SPONGE) IMPLANT
STAPLE RELOAD 45 WHT (STAPLE) ×5 IMPLANT
STAPLE RELOAD 45MM WHITE (STAPLE) ×5
SUT ETHILON 3 0 PS 1 (SUTURE) IMPLANT
SUT MNCRL AB 4-0 PS2 18 (SUTURE) ×4 IMPLANT
SUT PDS AB 1 CT1 27 (SUTURE) ×6 IMPLANT
SUT PROLENE 3 0 SH1 36 (SUTURE) IMPLANT
SUT VIC AB 2-0 SH 27 (SUTURE) ×1
SUT VIC AB 2-0 SH 27X BRD (SUTURE) ×2 IMPLANT
SUT VICRYL 0 UR6 27IN ABS (SUTURE) IMPLANT
TOWEL OR 17X26 10 PK STRL BLUE (TOWEL DISPOSABLE) ×2 IMPLANT
TRAY FOLEY MTR SLVR 16FR STAT (SET/KITS/TRAYS/PACK) ×2 IMPLANT
TRAY LAPAROSCOPIC (CUSTOM PROCEDURE TRAY) ×2 IMPLANT
TROCAR Z THREAD OPTICAL 12X100 (TROCAR) ×2 IMPLANT
TROCAR Z-THREAD OPTICAL 5X100M (TROCAR) IMPLANT
WATER STERILE IRR 1000ML POUR (IV SOLUTION) ×2 IMPLANT

## 2022-12-15 NOTE — Anesthesia Procedure Notes (Signed)
Procedure Name: Intubation Date/Time: 12/15/2022 1:22 PM  Performed by: Lovie Chol, CRNAPre-anesthesia Checklist: Patient identified, Emergency Drugs available, Suction available and Patient being monitored Patient Re-evaluated:Patient Re-evaluated prior to induction Oxygen Delivery Method: Circle System Utilized Preoxygenation: Pre-oxygenation with 100% oxygen Induction Type: IV induction Ventilation: Mask ventilation without difficulty Laryngoscope Size: Miller and 3 Grade View: Grade II Tube type: Oral Tube size: 7.5 mm Number of attempts: 1 Airway Equipment and Method: Stylet Placement Confirmation: ETT inserted through vocal cords under direct vision, positive ETCO2 and breath sounds checked- equal and bilateral Secured at: 22 cm Tube secured with: Tape Dental Injury: Teeth and Oropharynx as per pre-operative assessment

## 2022-12-15 NOTE — Discharge Instructions (Signed)

## 2022-12-15 NOTE — Transfer of Care (Signed)
Immediate Anesthesia Transfer of Care Note  Patient: Paul Flowers  Procedure(s) Performed: LEFT ROBOTIC RADICAL NEPHRECTOMY WITH RETROPERITONEAL NODE DISSECTION AND THROMBUS REMOVAL (Left: Abdomen)  Patient Location: PACU  Anesthesia Type:General  Level of Consciousness: drowsy  Airway & Oxygen Therapy: Patient Spontanous Breathing and Patient connected to face mask oxygen  Post-op Assessment: Report given to RN and Post -op Vital signs reviewed and stable  Post vital signs: Reviewed and stable  Last Vitals:  Vitals Value Taken Time  BP 135/93 12/15/22 1703  Temp    Pulse 102 12/15/22 1706  Resp 16 12/15/22 1706  SpO2 100 % 12/15/22 1706  Vitals shown include unvalidated device data.  Last Pain:  Vitals:   12/15/22 1038  TempSrc:   PainSc: 0-No pain         Complications: No notable events documented.

## 2022-12-15 NOTE — Anesthesia Postprocedure Evaluation (Signed)
Anesthesia Post Note  Patient: Paul Flowers  Procedure(s) Performed: LEFT ROBOTIC RADICAL NEPHRECTOMY WITH RETROPERITONEAL NODE DISSECTION AND THROMBUS REMOVAL (Left: Abdomen)     Patient location during evaluation: PACU Anesthesia Type: General Level of consciousness: awake and alert Pain management: pain level controlled Vital Signs Assessment: post-procedure vital signs reviewed and stable Respiratory status: spontaneous breathing, nonlabored ventilation and respiratory function stable Cardiovascular status: blood pressure returned to baseline and stable Postop Assessment: no apparent nausea or vomiting Anesthetic complications: no   No notable events documented.  Last Vitals:  Vitals:   12/15/22 1705 12/15/22 1800  BP: (!) 135/93 (!) 130/94  Pulse: (!) 106 94  Resp: (!) 24 16  Temp: 36.9 C   SpO2: 100% 92%    Last Pain:  Vitals:   12/15/22 1800  TempSrc:   PainSc: 5                  Lowella Curb

## 2022-12-15 NOTE — Brief Op Note (Signed)
12/15/2022  4:56 PM  PATIENT:  Paul Flowers  52 y.o. 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  POST-OPERATIVE DIAGNOSIS:  LEFT RENAL MASS AND TUMOR THROMBUS AND ADENOPATHY  PROCEDURE:  Procedure(s) with comments: LEFT ROBOTIC RADICAL NEPHRECTOMY WITH RETROPERITONEAL NODE DISSECTION AND THROMBUS REMOVAL (Left) - 3.5 HRS FOR CASE  SURGEON:  Surgeon(s) and Role:    * Jeanette Moffatt, Delbert Phenix., MD - Primary  PHYSICIAN ASSISTANT:   ASSISTANTS: Harrie Foreman PA   ANESTHESIA:   local and general  EBL:  175 mL   BLOOD ADMINISTERED:none  DRAINS:  foley to gravity    LOCAL MEDICATIONS USED:  MARCAINE     SPECIMEN:  Source of Specimen:  left kidney with tumro thrombus and peri-aortic lymph nodes  DISPOSITION OF SPECIMEN:  PATHOLOGY  COUNTS:  YES  TOURNIQUET:  * No tourniquets in log *  DICTATION: .Other Dictation: Dictation Number [id number]  PLAN OF CARE: Admit to inpatient   PATIENT DISPOSITION:  PACU - hemodynamically stable.   Delay start of Pharmacological VTE agent (>24hrs) due to surgical blood loss or risk of bleeding: yes
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
## 2022-12-15 NOTE — Op Note (Signed)
Paul Flowers, HUBLEY MEDICAL RECORD NO: [id number] ACCOUNT NO: [id number] DATE OF BIRTH: 04-30-1971 FACILITY: [facility] LOCATION: [facility] PHYSICIAN: Sebastian Ache, MD  Operative Report   DATE OF PROCEDURE: 12/15/2022  PREOPERATIVE DIAGNOSIS:  Large left renal mass with hilar adenopathy and tumor thrombus.  PROCEDURES:  Robotic-assisted laparoscopic left radical nephrectomy with retroperitoneal lymph node dissection.  ESTIMATED BLOOD LOSS:  175 mL  COMPLICATIONS:  None.  SPECIMEN:  Left kidney with periaortic lymph nodes and tumor thrombus en bloc.  ASSISTANT:  Harrie Foreman, PA  FINDINGS:   1.  Single artery, single vein left renal vascular anatomy significant thrombus and vein.  This was controlled using a milking technique. 2.  Significant desmoplastic reaction around the kidney with numerous parasitic vessels.  INDICATIONS:  The patient is a very pleasant 52 year old man who was found to have a very large left subrenal neoplasm by one of our satellite colleagues in [facility].  Staging imaging revealed an equivocal test lesion, but no obvious  distant disease.  He does have some hilar adenopathy and a significant tumor thrombus. Dedicated MRI revealed that the thrombus was just at the edge of the renal vein inferior vena cava junction and after review of imaging, I felt that this could be  safely performed in community setting.  He was referred for consideration of this.  He also had a very possible evaluation by medical oncology colleagues in [place] who also recommended surgery with a goal of reduction and he presents for this today.   Informed consent was obtained and placed in medical record.  PROCEDURE DETAILS:  The patient being identified and verified, procedure being left radical nephrectomy with retroperitoneal lymph node dissection was confirmed.  Procedure timeout was performed.  Intravenous antibiotics administered.  General  endotracheal anesthesia induced.   The patient was placed in the left side up full flank position pulling 15 degrees of table flexion, superior arm elevator, axillary roll sequential compression devices, bottom leg bent, top leg straight.  He was further  fastened to table using 3-inch tape with foam padding across supraxiphoid chest and his pelvis after Foley catheter was placed per urethra to straight drain arm elevator was used as was axillary roll.  A sterile field was created, prepped and draped the  patient's entire left flank and abdomen using chlorhexidine gluconate and a high flow, low pressure, pneumoperitoneum was obtained with Veress technique in left lower quadrant, having passed the aspiration and drop test.  An 8 mm robotic camera port was  then placed in position approximately 4 fingerbreadths superior lateral to the umbilicus.  Laparoscopic examination of peritoneal cavity revealed no significant adhesions, no visceral injury.  Additional ports were placed as follows:  Left subcostal 8 mm  robotic port, left far lateral 8 mm robotic port, approximately 3 fingerbreadths superior medial to the anterior iliac spine, left paramedian inferior robotic port, approximately 1 handbreadth superior to pubic ramus and two 12 mm assistant port sites  in the midline, one approximately 2 fingerbreadths superior to the planned camera port and another 2 fingerbreadths inferior.  Robot was docked and passed the electronic checks.  Initial attention was directed at development of retroperitoneum. Incision  made lateral to the descending colon from the area of the splenic flexure towards the area of the internal ring and the colon was very carefully began swept medially.  The plane between the anterior surface of Gerota's fascia and the colonic mesentery  was exquisitely desmoplastic as anticipated given his very large neoplasm.  There were innumerable small parasitic vessels, which were controlled initially using bipolar energy and an  exquisitely careful and systematic fashion.  Next we performed  incision of the left colon and was completely mobilized medially and a purposeful medial dissection was performed to allow maximal exposure to the hilum.  Lateral splenic attachments were taken down using cautery scissors allowing the spleen to self  retract medially and the plane between the posterior surface of the pancreas and inferior surface of the spleen and anterior superior surface of Gerota's fascia was carefully developed and the neoplasm had very, very high course in the abdomen, but I did  not directly involve the diaphragm or spleen.  Having been obtained maximal exposure around the anterior lateral Gerota's fascia lower pole kidney area was identified, placed on gentle lateral traction.  Dissection proceeded medial to this, the ureter  and gonadal vessels were encountered.  There were numerous small parasitic vessels entering the gonadal. At this point, the gonadal vessels were allowed to fall medially.  The ureter was retracted laterally and the plane between the ureter and psoas  musculature this triangle was further developed in a superior direction towards the area of the renal hilum. Towards the area of the renal hilum the large conglomerate of vessels which made up the gonadals were controlled using vascular stapler as there  was likely some increased flow through this given the obstruction of the main renal vein.  This better opened up the window to the area of the renal hilum.  A dissection plane was chosen directly on the lateral surface of the aorta, thus performing  retroperitoneal lymph node dissection.  With this plane was performed from positioned approximately 8 cm inferior to the hilum all the way to the hilum.  Renal hilum consisted of single artery, single vein renal vascular anatomy was anticipated.  The  artery was quite large caliber that was very carefully controlled using extra-large Hem-o-lok clip proximal,  followed by vascular stapler distal resulted in excellent hemostatic control of the artery. Additional medial plane dissection was performed  superior to the vein and just off the surface of the aorta, thus performing additional retroperitoneal lymph node dissection.  Incision starting approximately 4-6 cm superior to the renal hilum that all the paraaortic lymph node tissue remained with the  nephrectomy specimen.  The medial plane was then completely dissected such that only the vein with insitu thrombus was remaining.  The kidney was placed on maximal safe lateral traction and the area of the vein was dissected all the way to the inferior  vena cava and actually the right renal artery was carefully visualized as well, confirming the very medial nature of our dissection as anticipated.  We very carefully inspected the vein, there was clearly thrombus within it.  However, it did appear  amenable to a milking technique as compression of the vein even it was positioned approximately 1 cm lateral to the inferior vena cava appeared to contain no overt thrombus with milking. As such vascular stapler was brought in this location and the vein  was controlled essentially at the level of the inferior vena cava while milking the tumor thrombus into the nephrectomy specimen.  This resulted in excellent hemostatic control of the vein and there was no obvious gross tumor at the area of the vein  transection.  I was quite happy with the completeness and safety of this hilar control.  Additional superior attachments were taken down using a combination of cautery scissors and  bipolar energy as there were numerous parasitic vessels superiorly.   Similarly, the lateral attachments were taken down as well as an inferior attachments.  The ureter was doubly clipped and ligated.  This completely freed up the very large left radical nephrectomy specimen, which again contained a tumor thrombus and  periaortic lymph nodes en bloc.   Hilum was once again inspected and needle counts were correct.  Hemostasis was excellent.  Robot was then undocked.  Given the very large nature of the specimen, it was retrieved by extending the previous assistant port  sites together in the midline and then superiorly to the area of the xiphoid and inferiorly to position just superior to the umbilicus all via midline approach and the very large nephrectomy en bloc specimen was removed, set aside for permanent  pathology.  Abdomen was inspected via the extraction site, there was no evidence of visceral injury.  Hemostasis again appeared excellent.  Fascia was closed using figure-of-eight PDS x12 followed by reapproximation of Scarpa's with running Vicryl.  All  incision sites were infiltrated with dilute lipolyzed Marcaine and closed at the level of the skin using subcuticular Monocryl followed by Dermabond.  Procedure was then terminated.  The patient tolerated the procedure well, no immediate perioperative  complications.  The patient taken to postanesthesia care in stable condition.  Plan for inpatient admission.  Please note, first assistant, Harrie Foreman, was crucial for all portions of procedures today.  She provided invaluable retraction, suctioning, specimen manipulation, vascular clipping, vascular stapling, robotic instrument exchange and general first  assistance.   PUS D: 12/15/2022 5:07:07 pm T: 12/15/2022 9:04:00 pm  JOB: [id number]/ [id number]

## 2022-12-16 ENCOUNTER — Inpatient Hospital Stay (HOSPITAL_COMMUNITY): Payer: Federal, State, Local not specified - PPO

## 2022-12-16 ENCOUNTER — Encounter (HOSPITAL_COMMUNITY): Payer: Self-pay | Admitting: Urology

## 2022-12-16 DIAGNOSIS — R079 Chest pain, unspecified: Secondary | ICD-10-CM | POA: Diagnosis not present

## 2022-12-16 LAB — BASIC METABOLIC PANEL
Anion gap: 11 (ref 5–15)
BUN: 9 mg/dL (ref 6–20)
CO2: 24 mmol/L (ref 22–32)
Calcium: 8.3 mg/dL — ABNORMAL LOW (ref 8.9–10.3)
Chloride: 100 mmol/L (ref 98–111)
Creatinine, Ser: 1.02 mg/dL (ref 0.61–1.24)
GFR, Estimated: >60 mL/min (ref >60)
Glucose, Bld: 174 mg/dL — ABNORMAL HIGH (ref 70–99)
Potassium: 3.9 mmol/L (ref 3.5–5.1)
Sodium: 135 mmol/L (ref 135–145)

## 2022-12-16 LAB — HEMOGLOBIN AND HEMATOCRIT, BLOOD
HCT: 27.7 % — ABNORMAL LOW (ref 39.0–52.0)
Hemoglobin: 8.3 g/dL — ABNORMAL LOW (ref 13.0–17.0)

## 2022-12-16 MED ORDER — SIMETHICONE 80 MG PO CHEW
80.0000 mg | CHEWABLE_TABLET | Freq: Four times a day (QID) | ORAL | Status: DC | PRN
  Administered 2022-12-16: 80 mg via ORAL
  Filled 2022-12-16: qty 1

## 2022-12-16 MED ORDER — OXYCODONE HCL 5 MG PO TABS
5.0000 mg | ORAL_TABLET | Freq: Four times a day (QID) | ORAL | Status: DC | PRN
  Administered 2022-12-16 – 2022-12-17 (×3): 5 mg via ORAL
  Filled 2022-12-16 (×3): qty 1

## 2022-12-16 MED ORDER — ORAL CARE MOUTH RINSE: 15.0000 mL | OROMUCOSAL | Status: DC | PRN

## 2022-12-16 MED ORDER — ACETAMINOPHEN 500 MG PO TABS
1000.0000 mg | ORAL_TABLET | Freq: Four times a day (QID) | ORAL | Status: DC
  Administered 2022-12-16 – 2022-12-17 (×5): 1000 mg via ORAL
  Filled 2022-12-16 (×5): qty 2

## 2022-12-16 NOTE — Progress Notes (Signed)
Mobility Specialist - Progress Note  Pre-mobility: 115 bpm HR, 90% SpO2 During mobility: 138 bpm HR, 88% SpO2 Post-mobility: 113 bpm HR, 95% SPO2   12/16/22 1112  Oxygen Therapy  O2 Device Nasal Cannula  O2 Flow Rate (L/min) 2 L/min  Patient Activity (if Appropriate) Ambulating  Mobility  Activity Ambulated with assistance in room;Ambulated with assistance to bathroom  Level of Assistance Contact guard assist, steadying assist  Assistive Device Front wheel walker  Distance Ambulated (ft) 40 ft  Range of Motion/Exercises Active Assistive  Activity Response Tolerated well  Mobility Referral Yes  $Mobility charge 1 Mobility  Mobility Specialist Start Time (ACUTE ONLY) 1045  Mobility Specialist Stop Time (ACUTE ONLY) 1111  Mobility Specialist Time Calculation (min) (ACUTE ONLY) 26 min   Pt was found in bed and agreeable to try ambulation. Once sitting EOB SPO2 dropped to 88% and HR elevated in the 130s. SPO2 >90% and HR to 120s after a minute and RN notified. Pt agreeable to transfer to recliner chair. Assisted to bathroom after RN gave pain medication. At EOS was left on recliner chair with call bell in reach. Family in room.  Billey Chang Mobility Specialist

## 2022-12-16 NOTE — Progress Notes (Signed)
1 Day Post-Op Subjective: NAEO. Pain well-controlled. No flatus. Ambulated yesterday evening. O2 via  currently in place for comfort.  Objective: Vital signs in last 24 hours: Temp:  [97.5 F (36.4 C)-98.9 F (37.2 C)] 97.5 F (36.4 C) (06/13 0406) Pulse Rate:  [94-106] 94 (06/13 0406) Resp:  [11-24] 16 (06/13 0406) BP: (113-140)/(77-97) 116/77 (06/13 0406) SpO2:  [92 %-100 %] 98 % (06/13 0406) Weight:  [77.6 kg] 77.6 kg (06/12 1038)  Intake/Output from previous day: 06/12 0701 - 06/13 0700 In: 2662.3 [P.O.:240; I.V.:2122.3; IV Piggyback:300] Out: 1525 [Urine:1350; Blood:175] Intake/Output this shift: No intake/output data recorded.  Physical Exam:  General: Alert and oriented CV: RRR Lungs: Clear Abdomen: Soft, ND Incisions: clean, dry, intact GU: clear yellow urine Ext: NT, No erythema  Lab Results: Recent Labs    12/15/22 1727 12/16/22 0514  HGB 8.7* 8.3*  HCT 29.4* 27.7*   BMET Recent Labs    12/15/22 1727 12/16/22 0514  NA 139 135  K 3.8 3.9  CL 103 100  CO2 27 24  GLUCOSE 157* 174*  BUN 8 9  CREATININE 0.80 1.02  CALCIUM 8.6* 8.3*     Studies/Results: No results found.  Assessment/Plan: #Renal mass with tumor thrombus - S/p L radical nephrectomy, retroperitoneal lymph node disseciton - Multimodal analgesia - OOB, ambulate - TOV  - Regular diet, medlock - Await return of bowel function - Anticipate observation through POD#2 with discharge pending adequate pain management with PO agents   LOS: 1 day   Carlus Pavlov 12/16/2022, 8:01 AM

## 2022-12-16 NOTE — Progress Notes (Signed)
Patient ID: Paul Flowers, male   DOB: 1971/06/04, 52 y.o.   MRN: [id number]  On afternoon rounds pt mentioned "chest pain" with deep breathing in the right apical area.  No pain at rest.   Exam revealed no sub q emphysema but area over pec was tender to palp.  No labored breathing/SOB/wheezing.  O2 sat 93 on 2L [place].   Pt appears stable but will check PCXR to ensure no issues.

## 2022-12-16 NOTE — TOC Initial Note (Signed)
Transition of Care [facility]) - Initial/Assessment Note    Patient Details  Name: Paul Flowers MRN: [id number] Date of Birth: 13-Nov-1970  [facility]) CM/SW Contact:    Howell Rucks, RN Phone Number: 12/16/2022, 9:12 AM  Clinical Narrative: Met with pt at bedside to introduce role of TOC/NCM and review for dc needs. Pt admitted for scheduled surgical procedure, confirms he has PCP and pharmacy in place, no home DME or home services, has transport available when ready for discharge. Pt had no questions/concerns. TOC will continue to follow.                   Expected Discharge Plan: Home/Self Care Barriers to Discharge: Continued Medical Work up   Patient Goals and CMS Choice Patient states their goals for this hospitalization and ongoing recovery are:: Home with family          Expected Discharge Plan and Services   Discharge Planning Services: CM Consult   Living arrangements for the past 2 months: Single Family Home                                      Prior Living Arrangements/Services Living arrangements for the past 2 months: Single Family Home Lives with:: Spouse Patient language and need for interpreter reviewed:: Yes Do you feel safe going back to the place where you live?: Yes      Need for Family Participation in Patient Care: Yes (Comment) Care giver support system in place?: Yes (comment)   Criminal Activity/Legal Involvement Pertinent to Current Situation/Hospitalization: No - Comment as needed  Activities of Daily Living Home Assistive Devices/Equipment: None ADL Screening (condition at time of admission) Patient's cognitive ability adequate to safely complete daily activities?: Yes Is the patient deaf or have difficulty hearing?: No Does the patient have difficulty seeing, even when wearing glasses/contacts?: No Does the patient have difficulty concentrating, remembering, or making decisions?: No Patient able to express need for  assistance with ADLs?: Yes Does the patient have difficulty dressing or bathing?: No Independently performs ADLs?: Yes (appropriate for developmental age) Does the patient have difficulty walking or climbing stairs?: No Weakness of Legs: None Weakness of Arms/Hands: None  Permission Sought/Granted Permission sought to share information with : Case Manager Permission granted to share information with : Yes, Verbal Permission Granted  Share Information with NAME: Fannie Knee, RN           Emotional Assessment Appearance:: Appears stated age Attitude/Demeanor/Rapport: Gracious Affect (typically observed): Accepting Orientation: : Oriented to Self, Oriented to Place, Oriented to  Time, Oriented to Situation Alcohol / Substance Use: Not Applicable Psych Involvement: No (comment)  Admission diagnosis:  Renal mass [N28.89] Patient Active Problem List   Diagnosis Date Noted   Renal mass 12/15/2022   Lung nodule 11/25/2022   Iron deficiency anemia 11/02/2022   Shortness of breath 11/02/2022   Left kidney mass 10/19/2022   Nodule of lower lobe of left lung 10/19/2022   Other fatigue 10/14/2022   Chronic right-sided low back pain with right-sided sciatica 01/21/2022   Muscle spasm 01/21/2022   Tachycardia 06/12/2021   Multiple abrasions 12/23/2020   Current moderate episode of major depressive disorder without prior episode (HCC) 03/28/2019   Urinary frequency 07/26/2016   Hyperlipidemia associated with type 2 diabetes mellitus (HCC) 06/11/2015   Renal cyst, left, bosniack 1 12/03/2014   Controlled type 2 diabetes mellitus  without complication, without long-term current use of insulin (HCC) 09/24/2014   Hypertension associated with diabetes (HCC) 11/09/2013   OSA (obstructive sleep apnea) 03/19/2011   Persistent cough for 3 weeks or longer 05/03/2008   Allergic rhinitis 01/19/2007   GERD 01/19/2007   PCP:  Excell Seltzer, MD Pharmacy:   [facility] [phone] - [address], [place] -  [address] [place] [address] Phone: [phone] Fax: [phone]  [id number] [place], [place] - [address] AT [facility] [address] [place] [address] Phone: [phone] Fax: [phone]     Social Determinants of Health (SDOH) Social History: SDOH Screenings   Food Insecurity: No Food Insecurity (12/15/2022)  Housing: Low Risk  (12/15/2022)  Transportation Needs: No Transportation Needs (12/15/2022)  Utilities: Not At Risk (12/15/2022)  Alcohol Screen: Low Risk  (10/14/2022)  Depression (PHQ2-9): Low Risk  (07/19/2022)  Financial Resource Strain: Low Risk  (10/14/2022)  Physical Activity: Insufficiently Active (10/14/2022)  Social Connections: Moderately Integrated (10/14/2022)  Stress: No Stress Concern Present (10/14/2022)  Tobacco Use: Low Risk  (12/16/2022)   SDOH Interventions:     Readmission Risk Interventions    12/16/2022    9:11 AM  Readmission Risk Prevention Plan  Post Dischage Appt Complete  Medication Screening Complete  Transportation Screening Complete

## 2022-12-17 NOTE — Discharge Summary (Signed)
Date of admission: 12/15/2022  Date of discharge: 12/17/2022  Admission diagnosis: Renal mass with tumor thrombus  Discharge diagnosis: Same  Secondary diagnoses: None  History and Physical: For full details, please see admission history and physical. Briefly, Paul Flowers is a 52 y.o. 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   Hospital Course: Patient underwent robotic-assisted laparoscopic left radical nephrectomy and retroperitoneal lymphadenectomy with Dr. Berneice Heinrich on 12/15/22. He tolerated the procedure well. Post-operatively he was transferred to the floor for routine post-operative care. His hospitalization was uneventful, however pain with ambulation necessitated observation through POD#2. By POD#2, patient was ambulating without difficulty, tolerating PO, and pain was well controlled on oral medications. He was deemed appropriate for discharge.  Laboratory values:  Recent Labs    12/15/22 1727 12/16/22 0514  HGB 8.7* 8.3*  HCT 29.4* 27.7*   Recent Labs    12/15/22 1727 12/16/22 0514  CREATININE 0.80 1.02    Disposition: Home  Discharge instruction: The patient was instructed to be ambulatory but told to refrain from heavy lifting, strenuous activity, or driving.   Discharge medications:  Allergies as of 12/17/2022   Not on File      Medication List     STOP taking these medications    cholecalciferol 25 MCG (1000 UNIT) tablet Commonly known as: VITAMIN D3   cyanocobalamin 500 MCG tablet Commonly known as: VITAMIN B12   multivitamin with minerals Tabs tablet       TAKE these medications    atorvastatin 20 MG tablet Commonly known as: LIPITOR TAKE 1 TABLET BY MOUTH EVERY DAY   docusate sodium 100 MG capsule Commonly known as: COLACE Take 1 capsule (100 mg total) by mouth 2 (two) times daily.   ferrous gluconate 324 MG tablet Commonly known as: FERGON Take 1 tablet (324 mg total) by mouth  daily with breakfast.   fluticasone 50 MCG/ACT nasal spray Commonly known as: FLONASE Place 1 spray into both nostrils daily.   HYDROcodone-acetaminophen 5-325 MG tablet Commonly known as: Norco Take 1-2 tablets by mouth every 6 (six) hours as needed for moderate pain or severe pain.   losartan 25 MG tablet Commonly known as: COZAAR TAKE 1 TABLET BY MOUTH EVERY DAY   montelukast 10 MG tablet Commonly known as: SINGULAIR TAKE 1 TABLET BY MOUTH EVERYDAY AT BEDTIME What changed: See the new instructions.   omeprazole 20 MG capsule Commonly known as: PRILOSEC TAKE 1 CAPSULE BY MOUTH EVERY DAY   ONE TOUCH LANCETS Misc Use to test blood sugar once daily   sodium chloride 0.65 % Soln nasal spray Commonly known as: OCEAN Place 1 spray into both nostrils in the morning and at bedtime.   Xigduo XR 11-998 MG Tb24 Generic drug: Dapagliflozin Pro-metFORMIN ER TAKE 1 TABLET BY MOUTH EVERY DAY        Followup:   Follow-up Information     Berneice Heinrich Delbert Phenix., MD Follow up on 12/28/2022.   Specialty: Urology Why: at 9 AM for MD visit and pathology review. Contact information: [address] [place] [address] [phone]

## 2022-12-17 NOTE — Plan of Care (Signed)
  Problem: Education: Goal: Knowledge of the prescribed therapeutic regimen will improve Outcome: Adequate for Discharge   Problem: Bowel/Gastric: Goal: Gastrointestinal status for postoperative course will improve Outcome: Adequate for Discharge   Problem: Clinical Measurements: Goal: Postoperative complications will be avoided or minimized Outcome: Adequate for Discharge   Problem: Respiratory: Goal: Ability to achieve and maintain a regular respiratory rate will improve Outcome: Adequate for Discharge   Problem: Skin Integrity: Goal: Demonstration of wound healing without infection will improve Outcome: Adequate for Discharge   Problem: Urinary Elimination: Goal: Ability to avoid or minimize complications of infection will improve Outcome: Adequate for Discharge Goal: Ability to achieve and maintain urine output will improve Outcome: Adequate for Discharge   Problem: Education: Goal: Knowledge of General Education information will improve Description: Including pain rating scale, medication(s)/side effects and non-pharmacologic comfort measures Outcome: Adequate for Discharge   Problem: Health Behavior/Discharge Planning: Goal: Ability to manage health-related needs will improve Outcome: Adequate for Discharge   Problem: Clinical Measurements: Goal: Ability to maintain clinical measurements within normal limits will improve Outcome: Adequate for Discharge Goal: Will remain free from infection Outcome: Adequate for Discharge Goal: Diagnostic test results will improve Outcome: Adequate for Discharge Goal: Respiratory complications will improve Outcome: Adequate for Discharge Goal: Cardiovascular complication will be avoided Outcome: Adequate for Discharge   Problem: Activity: Goal: Risk for activity intolerance will decrease Outcome: Adequate for Discharge   Problem: Nutrition: Goal: Adequate nutrition will be maintained Outcome: Adequate for Discharge    Problem: Coping: Goal: Level of anxiety will decrease Outcome: Adequate for Discharge   Problem: Elimination: Goal: Will not experience complications related to bowel motility Outcome: Adequate for Discharge Goal: Will not experience complications related to urinary retention Outcome: Adequate for Discharge   Problem: Pain Managment: Goal: General experience of comfort will improve Outcome: Adequate for Discharge   Problem: Safety: Goal: Ability to remain free from injury will improve Outcome: Adequate for Discharge   Problem: Skin Integrity: Goal: Risk for impaired skin integrity will decrease Outcome: Adequate for Discharge   

## 2022-12-17 NOTE — Progress Notes (Signed)
Mobility Specialist - Progress Note   12/17/22 1029  Mobility  Activity Ambulated independently in hallway  Level of Assistance Independent  Assistive Device None  Distance Ambulated (ft) 500 ft  Range of Motion/Exercises Active  Activity Response Tolerated well  Mobility Referral Yes  $Mobility charge 1 Mobility  Mobility Specialist Start Time (ACUTE ONLY) 1020  Mobility Specialist Stop Time (ACUTE ONLY) 1029  Mobility Specialist Time Calculation (min) (ACUTE ONLY) 9 min   Pt was found on recliner chair and agreeable to ambulate. No complaints during session and at EOS returned to recliner chair with all needs met. Call bell in reach.  Billey Chang Mobility Specialist

## 2022-12-17 NOTE — Plan of Care (Signed)
Problem: Education: Goal: Knowledge of the prescribed therapeutic regimen will improve 12/17/2022 1340 by Julio Sicks, RN Outcome: Adequate for Discharge 12/17/2022 1340 by Julio Sicks, RN Outcome: Adequate for Discharge 12/17/2022 1217 by Julio Sicks, RN Outcome: Adequate for Discharge   Problem: Bowel/Gastric: Goal: Gastrointestinal status for postoperative course will improve 12/17/2022 1340 by Julio Sicks, RN Outcome: Adequate for Discharge 12/17/2022 1340 by Julio Sicks, RN Outcome: Adequate for Discharge 12/17/2022 1217 by Julio Sicks, RN Outcome: Adequate for Discharge   Problem: Clinical Measurements: Goal: Postoperative complications will be avoided or minimized 12/17/2022 1340 by Julio Sicks, RN Outcome: Adequate for Discharge 12/17/2022 1340 by Julio Sicks, RN Outcome: Adequate for Discharge 12/17/2022 1217 by Julio Sicks, RN Outcome: Adequate for Discharge   Problem: Respiratory: Goal: Ability to achieve and maintain a regular respiratory rate will improve 12/17/2022 1340 by Julio Sicks, RN Outcome: Adequate for Discharge 12/17/2022 1340 by Julio Sicks, RN Outcome: Adequate for Discharge 12/17/2022 1217 by Julio Sicks, RN Outcome: Adequate for Discharge   Problem: Skin Integrity: Goal: Demonstration of wound healing without infection will improve 12/17/2022 1340 by Julio Sicks, RN Outcome: Adequate for Discharge 12/17/2022 1340 by Julio Sicks, RN Outcome: Adequate for Discharge 12/17/2022 1217 by Julio Sicks, RN Outcome: Adequate for Discharge   Problem: Urinary Elimination: Goal: Ability to avoid or minimize complications of infection will improve 12/17/2022 1340 by Julio Sicks, RN Outcome: Adequate for Discharge 12/17/2022 1340 by Julio Sicks, RN Outcome: Adequate for Discharge 12/17/2022 1217 by Julio Sicks, RN Outcome: Adequate for Discharge Goal: Ability to achieve and  maintain urine output will improve 12/17/2022 1340 by Julio Sicks, RN Outcome: Adequate for Discharge 12/17/2022 1340 by Julio Sicks, RN Outcome: Adequate for Discharge 12/17/2022 1217 by Julio Sicks, RN Outcome: Adequate for Discharge   Problem: Education: Goal: Knowledge of General Education information will improve Description: Including pain rating scale, medication(s)/side effects and non-pharmacologic comfort measures 12/17/2022 1340 by Julio Sicks, RN Outcome: Adequate for Discharge 12/17/2022 1340 by Julio Sicks, RN Outcome: Adequate for Discharge 12/17/2022 1217 by Julio Sicks, RN Outcome: Adequate for Discharge   Problem: Health Behavior/Discharge Planning: Goal: Ability to manage health-related needs will improve 12/17/2022 1340 by Julio Sicks, RN Outcome: Adequate for Discharge 12/17/2022 1340 by Julio Sicks, RN Outcome: Adequate for Discharge 12/17/2022 1217 by Julio Sicks, RN Outcome: Adequate for Discharge   Problem: Clinical Measurements: Goal: Ability to maintain clinical measurements within normal limits will improve 12/17/2022 1340 by Julio Sicks, RN Outcome: Adequate for Discharge 12/17/2022 1340 by Julio Sicks, RN Outcome: Adequate for Discharge 12/17/2022 1217 by Julio Sicks, RN Outcome: Adequate for Discharge Goal: Will remain free from infection 12/17/2022 1340 by Julio Sicks, RN Outcome: Adequate for Discharge 12/17/2022 1340 by Julio Sicks, RN Outcome: Adequate for Discharge 12/17/2022 1217 by Julio Sicks, RN Outcome: Adequate for Discharge Goal: Diagnostic test results will improve 12/17/2022 1340 by Julio Sicks, RN Outcome: Adequate for Discharge 12/17/2022 1340 by Julio Sicks, RN Outcome: Adequate for Discharge 12/17/2022 1217 by Julio Sicks, RN Outcome: Adequate for Discharge Goal: Respiratory complications will improve 12/17/2022 1340 by Julio Sicks,  RN Outcome: Adequate for Discharge 12/17/2022 1340 by Julio Sicks, RN Outcome: Adequate for Discharge 12/17/2022 1217 by Julio Sicks, RN Outcome: Adequate for Discharge Goal: Cardiovascular complication will be avoided 12/17/2022  1340 by Julio Sicks, RN Outcome: Adequate for Discharge 12/17/2022 1340 by Julio Sicks, RN Outcome: Adequate for Discharge 12/17/2022 1217 by Julio Sicks, RN Outcome: Adequate for Discharge   Problem: Activity: Goal: Risk for activity intolerance will decrease 12/17/2022 1340 by Julio Sicks, RN Outcome: Adequate for Discharge 12/17/2022 1340 by Julio Sicks, RN Outcome: Adequate for Discharge 12/17/2022 1217 by Julio Sicks, RN Outcome: Adequate for Discharge   Problem: Nutrition: Goal: Adequate nutrition will be maintained 12/17/2022 1340 by Julio Sicks, RN Outcome: Adequate for Discharge 12/17/2022 1340 by Julio Sicks, RN Outcome: Adequate for Discharge 12/17/2022 1217 by Julio Sicks, RN Outcome: Adequate for Discharge   Problem: Coping: Goal: Level of anxiety will decrease 12/17/2022 1340 by Julio Sicks, RN Outcome: Adequate for Discharge 12/17/2022 1340 by Julio Sicks, RN Outcome: Adequate for Discharge 12/17/2022 1217 by Julio Sicks, RN Outcome: Adequate for Discharge   Problem: Elimination: Goal: Will not experience complications related to bowel motility 12/17/2022 1340 by Julio Sicks, RN Outcome: Adequate for Discharge 12/17/2022 1340 by Julio Sicks, RN Outcome: Adequate for Discharge 12/17/2022 1217 by Julio Sicks, RN Outcome: Adequate for Discharge Goal: Will not experience complications related to urinary retention 12/17/2022 1340 by Julio Sicks, RN Outcome: Adequate for Discharge 12/17/2022 1340 by Julio Sicks, RN Outcome: Adequate for Discharge 12/17/2022 1217 by Julio Sicks, RN Outcome: Adequate for Discharge   Problem: Pain Managment: Goal:  General experience of comfort will improve 12/17/2022 1340 by Julio Sicks, RN Outcome: Adequate for Discharge 12/17/2022 1340 by Julio Sicks, RN Outcome: Adequate for Discharge 12/17/2022 1217 by Julio Sicks, RN Outcome: Adequate for Discharge   Problem: Safety: Goal: Ability to remain free from injury will improve 12/17/2022 1340 by Julio Sicks, RN Outcome: Adequate for Discharge 12/17/2022 1340 by Julio Sicks, RN Outcome: Adequate for Discharge 12/17/2022 1217 by Julio Sicks, RN Outcome: Adequate for Discharge   Problem: Skin Integrity: Goal: Risk for impaired skin integrity will decrease 12/17/2022 1340 by Julio Sicks, RN Outcome: Adequate for Discharge 12/17/2022 1340 by Julio Sicks, RN Outcome: Adequate for Discharge 12/17/2022 1217 by Julio Sicks, RN Outcome: Adequate for Discharge

## 2022-12-18 ENCOUNTER — Other Ambulatory Visit: Payer: Self-pay

## 2022-12-18 ENCOUNTER — Inpatient Hospital Stay (HOSPITAL_COMMUNITY)
Admission: EM | Admit: 2022-12-18 | Discharge: 2022-12-25 | DRG: 175 | Disposition: A | Discharge status: Home / Self Care | Payer: Federal, State, Local not specified - PPO | Attending: Internal Medicine | Admitting: Internal Medicine

## 2022-12-18 DIAGNOSIS — C642 Malignant neoplasm of left kidney, except renal pelvis: Secondary | ICD-10-CM | POA: Diagnosis present

## 2022-12-18 DIAGNOSIS — G4733 Obstructive sleep apnea (adult) (pediatric): Secondary | ICD-10-CM | POA: Diagnosis not present

## 2022-12-18 DIAGNOSIS — J9601 Acute respiratory failure with hypoxia: Secondary | ICD-10-CM | POA: Diagnosis not present

## 2022-12-18 DIAGNOSIS — K567 Ileus, unspecified: Secondary | ICD-10-CM | POA: Diagnosis not present

## 2022-12-18 DIAGNOSIS — J9811 Atelectasis: Secondary | ICD-10-CM | POA: Diagnosis not present

## 2022-12-18 DIAGNOSIS — D62 Acute posthemorrhagic anemia: Secondary | ICD-10-CM | POA: Diagnosis not present

## 2022-12-18 DIAGNOSIS — I2602 Saddle embolus of pulmonary artery with acute cor pulmonale: Secondary | ICD-10-CM | POA: Diagnosis not present

## 2022-12-18 DIAGNOSIS — R931 Abnormal findings on diagnostic imaging of heart and coronary circulation: Secondary | ICD-10-CM

## 2022-12-18 DIAGNOSIS — Z833 Family history of diabetes mellitus: Secondary | ICD-10-CM | POA: Diagnosis not present

## 2022-12-18 DIAGNOSIS — Z8249 Family history of ischemic heart disease and other diseases of the circulatory system: Secondary | ICD-10-CM

## 2022-12-18 DIAGNOSIS — K219 Gastro-esophageal reflux disease without esophagitis: Secondary | ICD-10-CM | POA: Diagnosis present

## 2022-12-18 DIAGNOSIS — J189 Pneumonia, unspecified organism: Secondary | ICD-10-CM | POA: Diagnosis present

## 2022-12-18 DIAGNOSIS — K668 Other specified disorders of peritoneum: Secondary | ICD-10-CM | POA: Diagnosis not present

## 2022-12-18 DIAGNOSIS — E119 Type 2 diabetes mellitus without complications: Secondary | ICD-10-CM

## 2022-12-18 DIAGNOSIS — Z905 Acquired absence of kidney: Secondary | ICD-10-CM

## 2022-12-18 DIAGNOSIS — Z83719 Family history of colon polyps, unspecified: Secondary | ICD-10-CM

## 2022-12-18 DIAGNOSIS — R112 Nausea with vomiting, unspecified: Secondary | ICD-10-CM | POA: Diagnosis not present

## 2022-12-18 DIAGNOSIS — R0902 Hypoxemia: Secondary | ICD-10-CM | POA: Diagnosis not present

## 2022-12-18 DIAGNOSIS — A419 Sepsis, unspecified organism: Secondary | ICD-10-CM

## 2022-12-18 DIAGNOSIS — N2889 Other specified disorders of kidney and ureter: Secondary | ICD-10-CM | POA: Diagnosis present

## 2022-12-18 DIAGNOSIS — E1169 Type 2 diabetes mellitus with other specified complication: Secondary | ICD-10-CM | POA: Diagnosis not present

## 2022-12-18 DIAGNOSIS — I82220 Acute embolism and thrombosis of inferior vena cava: Secondary | ICD-10-CM | POA: Diagnosis present

## 2022-12-18 DIAGNOSIS — R0602 Shortness of breath: Secondary | ICD-10-CM | POA: Diagnosis not present

## 2022-12-18 DIAGNOSIS — R911 Solitary pulmonary nodule: Secondary | ICD-10-CM | POA: Diagnosis present

## 2022-12-18 DIAGNOSIS — E785 Hyperlipidemia, unspecified: Secondary | ICD-10-CM | POA: Diagnosis not present

## 2022-12-18 DIAGNOSIS — R079 Chest pain, unspecified: Secondary | ICD-10-CM | POA: Diagnosis not present

## 2022-12-18 DIAGNOSIS — E1151 Type 2 diabetes mellitus with diabetic peripheral angiopathy without gangrene: Secondary | ICD-10-CM | POA: Diagnosis not present

## 2022-12-18 DIAGNOSIS — N200 Calculus of kidney: Secondary | ICD-10-CM | POA: Diagnosis not present

## 2022-12-18 DIAGNOSIS — Z79899 Other long term (current) drug therapy: Secondary | ICD-10-CM

## 2022-12-18 DIAGNOSIS — I519 Heart disease, unspecified: Secondary | ICD-10-CM | POA: Diagnosis not present

## 2022-12-18 DIAGNOSIS — Z8349 Family history of other endocrine, nutritional and metabolic diseases: Secondary | ICD-10-CM

## 2022-12-18 DIAGNOSIS — R6520 Severe sepsis without septic shock: Secondary | ICD-10-CM | POA: Diagnosis not present

## 2022-12-18 DIAGNOSIS — I2694 Multiple subsegmental pulmonary emboli without acute cor pulmonale: Secondary | ICD-10-CM | POA: Diagnosis not present

## 2022-12-18 DIAGNOSIS — R000 Tachycardia, unspecified: Secondary | ICD-10-CM | POA: Diagnosis not present

## 2022-12-18 DIAGNOSIS — D649 Anemia, unspecified: Secondary | ICD-10-CM | POA: Diagnosis present

## 2022-12-18 DIAGNOSIS — E1159 Type 2 diabetes mellitus with other circulatory complications: Secondary | ICD-10-CM | POA: Diagnosis not present

## 2022-12-18 DIAGNOSIS — I10 Essential (primary) hypertension: Secondary | ICD-10-CM | POA: Diagnosis not present

## 2022-12-18 DIAGNOSIS — F419 Anxiety disorder, unspecified: Secondary | ICD-10-CM | POA: Diagnosis not present

## 2022-12-18 DIAGNOSIS — I152 Hypertension secondary to endocrine disorders: Secondary | ICD-10-CM | POA: Diagnosis not present

## 2022-12-18 DIAGNOSIS — I2699 Other pulmonary embolism without acute cor pulmonale: Secondary | ICD-10-CM | POA: Diagnosis not present

## 2022-12-18 DIAGNOSIS — Z823 Family history of stroke: Secondary | ICD-10-CM

## 2022-12-18 NOTE — ED Triage Notes (Signed)
Pt BIB [facility] from home. Pt c/o increasing SOB. Pt hx nephrectomy Wednesday. Pt c/o of pain when taking a breath. Lung sounds clear.  150HR ems administered saline HR down to 132 155/96 94% RA 98% on 2LNC 143CBG 16RR

## 2022-12-19 ENCOUNTER — Inpatient Hospital Stay (HOSPITAL_COMMUNITY): Payer: Federal, State, Local not specified - PPO

## 2022-12-19 ENCOUNTER — Emergency Department (HOSPITAL_COMMUNITY): Payer: Federal, State, Local not specified - PPO

## 2022-12-19 ENCOUNTER — Other Ambulatory Visit: Payer: Self-pay

## 2022-12-19 ENCOUNTER — Encounter (HOSPITAL_COMMUNITY): Payer: Self-pay | Admitting: Family Medicine

## 2022-12-19 DIAGNOSIS — I2699 Other pulmonary embolism without acute cor pulmonale: Secondary | ICD-10-CM

## 2022-12-19 DIAGNOSIS — J9811 Atelectasis: Secondary | ICD-10-CM | POA: Diagnosis not present

## 2022-12-19 DIAGNOSIS — E119 Type 2 diabetes mellitus without complications: Secondary | ICD-10-CM | POA: Diagnosis not present

## 2022-12-19 DIAGNOSIS — R112 Nausea with vomiting, unspecified: Secondary | ICD-10-CM | POA: Diagnosis not present

## 2022-12-19 DIAGNOSIS — R0902 Hypoxemia: Secondary | ICD-10-CM | POA: Diagnosis not present

## 2022-12-19 DIAGNOSIS — Z8249 Family history of ischemic heart disease and other diseases of the circulatory system: Secondary | ICD-10-CM | POA: Diagnosis not present

## 2022-12-19 DIAGNOSIS — N2889 Other specified disorders of kidney and ureter: Secondary | ICD-10-CM

## 2022-12-19 DIAGNOSIS — R109 Unspecified abdominal pain: Secondary | ICD-10-CM | POA: Diagnosis not present

## 2022-12-19 DIAGNOSIS — D62 Acute posthemorrhagic anemia: Secondary | ICD-10-CM | POA: Diagnosis not present

## 2022-12-19 DIAGNOSIS — E1151 Type 2 diabetes mellitus with diabetic peripheral angiopathy without gangrene: Secondary | ICD-10-CM | POA: Diagnosis not present

## 2022-12-19 DIAGNOSIS — J189 Pneumonia, unspecified organism: Secondary | ICD-10-CM | POA: Diagnosis not present

## 2022-12-19 DIAGNOSIS — E1159 Type 2 diabetes mellitus with other circulatory complications: Secondary | ICD-10-CM

## 2022-12-19 DIAGNOSIS — E785 Hyperlipidemia, unspecified: Secondary | ICD-10-CM | POA: Diagnosis not present

## 2022-12-19 DIAGNOSIS — F419 Anxiety disorder, unspecified: Secondary | ICD-10-CM | POA: Diagnosis not present

## 2022-12-19 DIAGNOSIS — Z4659 Encounter for fitting and adjustment of other gastrointestinal appliance and device: Secondary | ICD-10-CM | POA: Diagnosis not present

## 2022-12-19 DIAGNOSIS — Z823 Family history of stroke: Secondary | ICD-10-CM | POA: Diagnosis not present

## 2022-12-19 DIAGNOSIS — I152 Hypertension secondary to endocrine disorders: Secondary | ICD-10-CM | POA: Diagnosis not present

## 2022-12-19 DIAGNOSIS — Z8349 Family history of other endocrine, nutritional and metabolic diseases: Secondary | ICD-10-CM | POA: Diagnosis not present

## 2022-12-19 DIAGNOSIS — N200 Calculus of kidney: Secondary | ICD-10-CM | POA: Diagnosis not present

## 2022-12-19 DIAGNOSIS — Z905 Acquired absence of kidney: Secondary | ICD-10-CM | POA: Diagnosis not present

## 2022-12-19 DIAGNOSIS — Z833 Family history of diabetes mellitus: Secondary | ICD-10-CM | POA: Diagnosis not present

## 2022-12-19 DIAGNOSIS — C642 Malignant neoplasm of left kidney, except renal pelvis: Secondary | ICD-10-CM | POA: Diagnosis not present

## 2022-12-19 DIAGNOSIS — E1169 Type 2 diabetes mellitus with other specified complication: Secondary | ICD-10-CM | POA: Diagnosis not present

## 2022-12-19 DIAGNOSIS — R911 Solitary pulmonary nodule: Secondary | ICD-10-CM | POA: Diagnosis not present

## 2022-12-19 DIAGNOSIS — G4733 Obstructive sleep apnea (adult) (pediatric): Secondary | ICD-10-CM | POA: Diagnosis not present

## 2022-12-19 DIAGNOSIS — J984 Other disorders of lung: Secondary | ICD-10-CM | POA: Diagnosis not present

## 2022-12-19 DIAGNOSIS — K219 Gastro-esophageal reflux disease without esophagitis: Secondary | ICD-10-CM | POA: Diagnosis present

## 2022-12-19 DIAGNOSIS — R931 Abnormal findings on diagnostic imaging of heart and coronary circulation: Secondary | ICD-10-CM | POA: Diagnosis not present

## 2022-12-19 DIAGNOSIS — I2694 Multiple subsegmental pulmonary emboli without acute cor pulmonale: Principal | ICD-10-CM

## 2022-12-19 DIAGNOSIS — I519 Heart disease, unspecified: Secondary | ICD-10-CM | POA: Diagnosis not present

## 2022-12-19 DIAGNOSIS — K668 Other specified disorders of peritoneum: Secondary | ICD-10-CM | POA: Diagnosis not present

## 2022-12-19 DIAGNOSIS — R0602 Shortness of breath: Secondary | ICD-10-CM | POA: Diagnosis not present

## 2022-12-19 DIAGNOSIS — Z83719 Family history of colon polyps, unspecified: Secondary | ICD-10-CM | POA: Diagnosis not present

## 2022-12-19 DIAGNOSIS — I10 Essential (primary) hypertension: Secondary | ICD-10-CM | POA: Diagnosis not present

## 2022-12-19 DIAGNOSIS — Z79899 Other long term (current) drug therapy: Secondary | ICD-10-CM | POA: Diagnosis not present

## 2022-12-19 DIAGNOSIS — K567 Ileus, unspecified: Secondary | ICD-10-CM | POA: Diagnosis not present

## 2022-12-19 DIAGNOSIS — I82220 Acute embolism and thrombosis of inferior vena cava: Secondary | ICD-10-CM | POA: Diagnosis not present

## 2022-12-19 DIAGNOSIS — I2602 Saddle embolus of pulmonary artery with acute cor pulmonale: Secondary | ICD-10-CM | POA: Diagnosis not present

## 2022-12-19 LAB — BASIC METABOLIC PANEL
Anion gap: 9 (ref 5–15)
BUN: 10 mg/dL (ref 6–20)
CO2: 24 mmol/L (ref 22–32)
Calcium: 8.1 mg/dL — ABNORMAL LOW (ref 8.9–10.3)
Chloride: 104 mmol/L (ref 98–111)
Creatinine, Ser: 0.9 mg/dL (ref 0.61–1.24)
GFR, Estimated: >60 mL/min (ref >60)
Glucose, Bld: 114 mg/dL — ABNORMAL HIGH (ref 70–99)
Potassium: 3.3 mmol/L — ABNORMAL LOW (ref 3.5–5.1)
Sodium: 137 mmol/L (ref 135–145)

## 2022-12-19 LAB — CBC WITH DIFFERENTIAL/PLATELET
Abs Immature Granulocytes: 0.07 10*3/uL (ref 0.00–0.07)
Basophils Absolute: 0 10*3/uL (ref 0.0–0.1)
Basophils Relative: 0 %
Eosinophils Absolute: 0.2 10*3/uL (ref 0.0–0.5)
Eosinophils Relative: 2 %
HCT: 30.7 % — ABNORMAL LOW (ref 39.0–52.0)
Hemoglobin: 9.3 g/dL — ABNORMAL LOW (ref 13.0–17.0)
Immature Granulocytes: 1 %
Lymphocytes Relative: 13 %
Lymphs Abs: 1.4 10*3/uL (ref 0.7–4.0)
MCH: 24.4 pg — ABNORMAL LOW (ref 26.0–34.0)
MCHC: 30.3 g/dL (ref 30.0–36.0)
MCV: 80.6 fL (ref 80.0–100.0)
Monocytes Absolute: 1.5 10*3/uL — ABNORMAL HIGH (ref 0.1–1.0)
Monocytes Relative: 13 %
Neutro Abs: 7.8 10*3/uL — ABNORMAL HIGH (ref 1.7–7.7)
Neutrophils Relative %: 71 %
Platelets: 253 10*3/uL (ref 150–400)
RBC: 3.81 MIL/uL — ABNORMAL LOW (ref 4.22–5.81)
RDW: 14.4 % (ref 11.5–15.5)
WBC: 11 10*3/uL — ABNORMAL HIGH (ref 4.0–10.5)
nRBC: 0 % (ref 0.0–0.2)

## 2022-12-19 LAB — BPAM RBC
Blood Product Expiration Date: 202407112359
Blood Product Expiration Date: 202407152359
Unit Type and Rh: 5100
Unit Type and Rh: 5100
Unit Type and Rh: 5100

## 2022-12-19 LAB — COMPREHENSIVE METABOLIC PANEL
ALT: 13 U/L (ref 0–44)
AST: 12 U/L — ABNORMAL LOW (ref 15–41)
Albumin: 2.6 g/dL — ABNORMAL LOW (ref 3.5–5.0)
Alkaline Phosphatase: 79 U/L (ref 38–126)
Anion gap: 10 (ref 5–15)
BUN: 11 mg/dL (ref 6–20)
CO2: 26 mmol/L (ref 22–32)
Calcium: 8.5 mg/dL — ABNORMAL LOW (ref 8.9–10.3)
Chloride: 102 mmol/L (ref 98–111)
Creatinine, Ser: 1.18 mg/dL (ref 0.61–1.24)
GFR, Estimated: >60 mL/min (ref >60)
Glucose, Bld: 147 mg/dL — ABNORMAL HIGH (ref 70–99)
Potassium: 3.4 mmol/L — ABNORMAL LOW (ref 3.5–5.1)
Sodium: 138 mmol/L (ref 135–145)
Total Bilirubin: 0.8 mg/dL (ref 0.3–1.2)
Total Protein: 7.9 g/dL (ref 6.5–8.1)

## 2022-12-19 LAB — TYPE AND SCREEN
Antibody Screen: NEGATIVE
Unit division: 0
Unit division: 0

## 2022-12-19 LAB — URINALYSIS, ROUTINE W REFLEX MICROSCOPIC
Bacteria, UA: NONE SEEN
Bilirubin Urine: NEGATIVE
Glucose, UA: >=500 mg/dL — AB
Hgb urine dipstick: NEGATIVE
Ketones, ur: NEGATIVE mg/dL
Leukocytes,Ua: NEGATIVE
Nitrite: NEGATIVE
Protein, ur: NEGATIVE mg/dL
Specific Gravity, Urine: 1.011 (ref 1.005–1.030)
pH: 7 (ref 5.0–8.0)

## 2022-12-19 LAB — HEPARIN LEVEL (UNFRACTIONATED)
Heparin Unfractionated: 0.45 IU/mL (ref 0.30–0.70)
Heparin Unfractionated: 0.23 IU/mL — ABNORMAL LOW (ref 0.30–0.70)
Heparin Unfractionated: 0.44 IU/mL (ref 0.30–0.70)

## 2022-12-19 LAB — BRAIN NATRIURETIC PEPTIDE: B Natriuretic Peptide: 32.3 pg/mL (ref 0.0–100.0)

## 2022-12-19 LAB — STREP PNEUMONIAE URINARY ANTIGEN: Strep Pneumo Urinary Antigen: NEGATIVE

## 2022-12-19 LAB — GLUCOSE, CAPILLARY
Glucose-Capillary: 90 mg/dL (ref 70–99)
Glucose-Capillary: 157 mg/dL — ABNORMAL HIGH (ref 70–99)
Glucose-Capillary: 101 mg/dL — ABNORMAL HIGH (ref 70–99)
Glucose-Capillary: 156 mg/dL — ABNORMAL HIGH (ref 70–99)

## 2022-12-19 LAB — TROPONIN I (HIGH SENSITIVITY)
Troponin I (High Sensitivity): 7 ng/L (ref <18)
Troponin I (High Sensitivity): 9 ng/L (ref <18)

## 2022-12-19 LAB — HIV ANTIBODY (ROUTINE TESTING W REFLEX): HIV Screen 4th Generation wRfx: NONREACTIVE

## 2022-12-19 LAB — ANTITHROMBIN III: AntiThromb III Func: 90 % (ref 75–120)

## 2022-12-19 LAB — MAGNESIUM: Magnesium: 1.8 mg/dL (ref 1.7–2.4)

## 2022-12-19 MED ORDER — SALINE SPRAY 0.65 % NA SOLN: 1.0000 | NASAL | Status: DC | PRN

## 2022-12-19 MED ORDER — SODIUM CHLORIDE 0.9 % IV SOLN
2.0000 g | INTRAVENOUS | Status: AC
  Administered 2022-12-19 – 2022-12-23 (×5): 2 g via INTRAVENOUS
  Filled 2022-12-19 (×5): qty 20

## 2022-12-19 MED ORDER — POTASSIUM CHLORIDE CRYS ER 20 MEQ PO TBCR
20.0000 meq | EXTENDED_RELEASE_TABLET | Freq: Once | ORAL | Status: AC
  Administered 2022-12-19: 20 meq via ORAL
  Filled 2022-12-19: qty 1

## 2022-12-19 MED ORDER — INSULIN ASPART 100 UNIT/ML IJ SOLN
0.0000 [IU] | Freq: Every day | INTRAMUSCULAR | Status: DC
  Filled 2022-12-19: qty 0.05

## 2022-12-19 MED ORDER — AZITHROMYCIN 500 MG PO TABS
500.0000 mg | ORAL_TABLET | Freq: Every day | ORAL | Status: AC
  Administered 2022-12-19 – 2022-12-23 (×5): 500 mg via ORAL
  Filled 2022-12-19 (×5): qty 1

## 2022-12-19 MED ORDER — METOPROLOL TARTRATE 5 MG/5ML IV SOLN
5.0000 mg | Freq: Once | INTRAVENOUS | Status: AC
  Administered 2022-12-19: 5 mg via INTRAVENOUS
  Filled 2022-12-19: qty 5

## 2022-12-19 MED ORDER — PANTOPRAZOLE SODIUM 40 MG PO TBEC
40.0000 mg | DELAYED_RELEASE_TABLET | Freq: Every day | ORAL | Status: DC
  Administered 2022-12-19 – 2022-12-23 (×5): 40 mg via ORAL
  Filled 2022-12-19 (×5): qty 1

## 2022-12-19 MED ORDER — ONDANSETRON HCL 4 MG/2ML IJ SOLN
4.0000 mg | Freq: Four times a day (QID) | INTRAMUSCULAR | Status: DC | PRN
  Administered 2022-12-19 – 2022-12-24 (×4): 4 mg via INTRAVENOUS
  Filled 2022-12-19 (×4): qty 2

## 2022-12-19 MED ORDER — HEPARIN (PORCINE) 25000 UT/250ML-% IV SOLN
1600.0000 [IU]/h | INTRAVENOUS | Status: AC
  Administered 2022-12-19 (×2): 1400 [IU]/h via INTRAVENOUS
  Administered 2022-12-20: 1600 [IU]/h via INTRAVENOUS
  Filled 2022-12-19 (×3): qty 250

## 2022-12-19 MED ORDER — METOPROLOL TARTRATE 25 MG PO TABS
25.0000 mg | ORAL_TABLET | Freq: Two times a day (BID) | ORAL | Status: DC
  Administered 2022-12-19 – 2022-12-25 (×12): 25 mg via ORAL
  Filled 2022-12-19 (×12): qty 1

## 2022-12-19 MED ORDER — LIP MEDEX EX OINT
TOPICAL_OINTMENT | CUTANEOUS | Status: DC | PRN
  Filled 2022-12-19: qty 7

## 2022-12-19 MED ORDER — GUAIFENESIN 100 MG/5ML PO LIQD
5.0000 mL | Freq: Four times a day (QID) | ORAL | Status: DC | PRN
  Administered 2022-12-19 – 2022-12-20 (×3): 5 mL via ORAL
  Filled 2022-12-19 (×3): qty 10

## 2022-12-19 MED ORDER — ONDANSETRON HCL 4 MG PO TABS
4.0000 mg | ORAL_TABLET | Freq: Four times a day (QID) | ORAL | Status: DC | PRN
  Administered 2022-12-22: 4 mg via ORAL
  Filled 2022-12-19: qty 1

## 2022-12-19 MED ORDER — SODIUM CHLORIDE 0.9% FLUSH
3.0000 mL | Freq: Two times a day (BID) | INTRAVENOUS | Status: DC
  Administered 2022-12-19 – 2022-12-25 (×11): 3 mL via INTRAVENOUS

## 2022-12-19 MED ORDER — IOHEXOL 350 MG/ML SOLN
100.0000 mL | Freq: Once | INTRAVENOUS | Status: AC | PRN
  Administered 2022-12-19: 100 mL via INTRAVENOUS

## 2022-12-19 MED ORDER — SODIUM CHLORIDE 0.9 % IV SOLN
2.0000 g | Freq: Once | INTRAVENOUS | Status: AC
  Administered 2022-12-19: 2 g via INTRAVENOUS
  Filled 2022-12-19: qty 12.5

## 2022-12-19 MED ORDER — INSULIN ASPART 100 UNIT/ML IJ SOLN
0.0000 [IU] | Freq: Three times a day (TID) | INTRAMUSCULAR | Status: DC
  Administered 2022-12-19 – 2022-12-20 (×2): 1 [IU] via SUBCUTANEOUS
  Filled 2022-12-19: qty 0.06

## 2022-12-19 MED ORDER — DOCUSATE SODIUM 100 MG PO CAPS
100.0000 mg | ORAL_CAPSULE | Freq: Two times a day (BID) | ORAL | Status: DC
  Administered 2022-12-19 – 2022-12-23 (×8): 100 mg via ORAL
  Filled 2022-12-19 (×9): qty 1

## 2022-12-19 MED ORDER — MONTELUKAST SODIUM 10 MG PO TABS
10.0000 mg | ORAL_TABLET | Freq: Every day | ORAL | Status: DC
  Administered 2022-12-19 – 2022-12-21 (×3): 10 mg via ORAL
  Filled 2022-12-19 (×4): qty 1

## 2022-12-19 MED ORDER — ACETAMINOPHEN 325 MG PO TABS
650.0000 mg | ORAL_TABLET | Freq: Four times a day (QID) | ORAL | Status: DC | PRN
  Administered 2022-12-19: 650 mg via ORAL
  Filled 2022-12-19: qty 2

## 2022-12-19 MED ORDER — HEPARIN BOLUS VIA INFUSION
2500.0000 [IU] | Freq: Once | INTRAVENOUS | Status: AC
  Administered 2022-12-19: 2500 [IU] via INTRAVENOUS
  Filled 2022-12-19: qty 2500

## 2022-12-19 MED ORDER — ACETAMINOPHEN 650 MG RE SUPP: 650.0000 mg | Freq: Four times a day (QID) | RECTAL | Status: DC | PRN

## 2022-12-19 MED ORDER — POLYETHYLENE GLYCOL 3350 17 G PO PACK: 17.0000 g | PACK | Freq: Every day | ORAL | Status: DC | PRN

## 2022-12-19 MED ORDER — ALPRAZOLAM 0.5 MG PO TABS
0.5000 mg | ORAL_TABLET | Freq: Two times a day (BID) | ORAL | Status: DC | PRN
  Administered 2022-12-19 – 2022-12-22 (×4): 0.5 mg via ORAL
  Filled 2022-12-19 (×5): qty 1

## 2022-12-19 MED ORDER — LACTATED RINGERS IV BOLUS
1000.0000 mL | Freq: Once | INTRAVENOUS | Status: AC
  Administered 2022-12-19: 1000 mL via INTRAVENOUS

## 2022-12-19 MED ORDER — VANCOMYCIN HCL IN DEXTROSE 1-5 GM/200ML-% IV SOLN
1000.0000 mg | Freq: Once | INTRAVENOUS | Status: AC
  Administered 2022-12-19: 1000 mg via INTRAVENOUS
  Filled 2022-12-19: qty 200

## 2022-12-19 MED ORDER — LOSARTAN POTASSIUM 25 MG PO TABS
25.0000 mg | ORAL_TABLET | Freq: Every day | ORAL | Status: DC
  Administered 2022-12-19 – 2022-12-25 (×7): 25 mg via ORAL
  Filled 2022-12-19 (×7): qty 1

## 2022-12-19 MED ORDER — OXYCODONE HCL 5 MG PO TABS
5.0000 mg | ORAL_TABLET | ORAL | Status: DC | PRN
  Administered 2022-12-19 – 2022-12-24 (×8): 5 mg via ORAL
  Filled 2022-12-19 (×10): qty 1

## 2022-12-19 MED ORDER — ATORVASTATIN CALCIUM 20 MG PO TABS
20.0000 mg | ORAL_TABLET | Freq: Every day | ORAL | Status: DC
  Administered 2022-12-19 – 2022-12-23 (×5): 20 mg via ORAL
  Filled 2022-12-19 (×5): qty 1

## 2022-12-19 MED ORDER — HYDROMORPHONE HCL 1 MG/ML IJ SOLN
0.5000 mg | INTRAMUSCULAR | Status: DC | PRN
  Administered 2022-12-19 – 2022-12-21 (×5): 0.5 mg via INTRAVENOUS
  Filled 2022-12-19 (×5): qty 0.5
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
## 2022-12-19 NOTE — Progress Notes (Signed)
Mother called and given update on patient.

## 2022-12-19 NOTE — Consult Note (Signed)
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 vein but not in the IVC. This could be a small tumor thrombus. 7. Interval left nephroadrenalectomy with low-density fluid settling posteriorly in the left renal fossa measuring 7.4 x 2.8 x 12.4 cm. This is probably a postoperative seroma or residual postoperative fluid. 8. Extensive pneumoperitoneum, almost all of which is anterior. This is probably postoperative free air, with hollow viscus perforation not strictly excluded. 9. Small amount of low-density free fluid in the posterior right deep pelvis. 10. Cholelithiasis. 11. Nonobstructive nephrolithiasis. 12. Aortic atherosclerosis.  Remaining findings described above. 13. Critical Value/emergent results were called by telephone at the time of interpretation on 12/19/2022 at 2:06 am to provider [facility] , who verbally acknowledged these results. Aortic Atherosclerosis (ICD10-I70.0). Electronically Signed   By: Almira Bar M.D.   On: 12/19/2022 02:50   DG Chest Portable 1 View  Result Date: 12/19/2022 CLINICAL DATA:  Shortness of breath EXAM: PORTABLE CHEST 1  VIEW COMPARISON:  12/16/2022 FINDINGS: Continued free air under the right hemi diets crash  that continued free air under the hemidiaphragms, likely related to recent surgery. Low lung volumes with bibasilar atelectasis and vascular congestion. Heart and mediastinal contours within normal limits. No visible effusions or pneumothorax. IMPRESSION: Low lung volumes, bibasilar atelectasis, vascular congestion. Pneumoperitoneum, likely related to recent laparoscopic surgery. Electronically Signed   By: Charlett Nose M.D.   On: 12/19/2022 00:32    I independently reviewed the above imaging studies.  Impression/Recommendation #Bilateral PE - Management per primary team. 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 Alert Urology if worsening/uncontrolled abdominal pain or concern for peritonitis   Carlus Pavlov 12/19/2022, 9:48 AM

## 2022-12-19 NOTE — Progress Notes (Signed)
Patient admitted after midnight. 52 year old male discharged from the hospital on 12/17/2022 after left nephrectomy comes in with bilateral pulmonary embolism with no heart strain.  He is admitted on IV heparin and IV antibiotics chest x-ray shows some consolidation.  Will continue IV heparin today and may change to DOAC tomorrow.

## 2022-12-19 NOTE — ED Provider Notes (Signed)
[place] [facility]ERGENCY DEPARTMENT AT [facility] Provider Note   CSN: [id number] Arrival date & time: 12/18/22  2338     History  Chief Complaint  Patient presents with   Shortness of Breath    Paul Flowers is a 52 y.o. male.  Level 5 via respiratory distress.  Patient here with shortness of breath suddenly worsened tonight about 9 PM.  States has been short of breath since April with no clear diagnosis.  No history of COPD or asthma.  Does not smoke.  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  States he is now cancer free.  Believes he had a fever last night around 101.  Breathing became worse today with nonproductive cough.  Difficulty lying flat which is his baseline.  EMS reports clear lungs with tachycardia in the 150s.  He denies any chest pain but does have upper back pain or rib pain with deep breathing.  Having his abdominal tenderness at the site of his surgery.  No vomiting.  No leg pain or leg swelling.  No history of CHF.  No history of DVT or PE.  The history is provided by the patient and the EMS personnel. The history is limited by the condition of the patient.  Shortness of Breath Associated symptoms: no abdominal pain, no chest pain, no fever, no headaches, no rash and no vomiting        Home Medications Prior to Admission medications   Medication Sig Start Date End Date Taking? Authorizing Provider  atorvastatin (LIPITOR) 20 MG tablet TAKE 1 TABLET BY MOUTH EVERY DAY 03/12/22   Bedsole, Amy E, MD  docusate sodium (COLACE) 100 MG capsule Take 1 capsule (100 mg total) by mouth 2 (two) times daily. 12/15/22   Harrie Foreman, PA-C  ferrous gluconate (FERGON) 324 MG tablet Take 1 tablet (324 mg total) by mouth daily with breakfast. Patient not taking: Reported on 12/01/2022 11/02/22 01/31/23  Michaelyn Barter, MD  fluticasone [facility]) 50 MCG/ACT nasal spray Place 1 spray into both nostrils daily. 11/25/22    Raechel Chute, MD  HYDROcodone-acetaminophen (NORCO) 5-325 MG tablet Take 1-2 tablets by mouth every 6 (six) hours as needed for moderate pain or severe pain. 12/15/22   Harrie Foreman, PA-C  losartan (COZAAR) 25 MG tablet TAKE 1 TABLET BY MOUTH EVERY DAY 02/28/22   Bedsole, Amy E, MD  montelukast (SINGULAIR) 10 MG tablet TAKE 1 TABLET BY MOUTH EVERYDAY AT BEDTIME Patient taking differently: Take 10 mg by mouth daily. TAKE 1 TABLET BY MOUTH EVERYDAY 03/12/22   Bedsole, Amy E, MD  omeprazole (PRILOSEC) 20 MG capsule TAKE 1 CAPSULE BY MOUTH EVERY DAY 08/27/22   Ermalene Searing, Amy E, MD  ONE TOUCH LANCETS MISC Use to test blood sugar once daily 08/23/16   Reather Littler, MD  sodium chloride (OCEAN) 0.65 % SOLN nasal spray Place 1 spray into both nostrils in the morning and at bedtime. 11/25/22   Raechel Chute, MD  XIGDUO XR 11-998 MG TB24 TAKE 1 TABLET BY MOUTH EVERY DAY 12/15/22   Reather Littler, MD      Allergies    Patient has no allergy information on record.    Review of Systems   Review of Systems  Constitutional:  Negative for activity change, appetite change and fever.  HENT:  Negative for congestion and rhinorrhea.   Respiratory:  Positive for chest tightness and shortness of breath.   Cardiovascular:  Negative for  chest pain and leg swelling.  Gastrointestinal:  Negative for abdominal pain, nausea and vomiting.  Genitourinary:  Negative for dysuria and hematuria.  Musculoskeletal:  Negative for arthralgias and myalgias.  Skin:  Negative for rash.  Neurological: Negative.  Negative for dizziness, weakness and headaches.   all other systems are negative except as noted in the HPI and PMH.    Physical Exam Updated Vital Signs BP (!) 157/106 (BP Location: Left Arm)   Pulse (!) 135   Temp 98 F (36.7 C) (Oral)   Resp 20   SpO2 99%  Physical Exam Vitals and nursing note reviewed.  Constitutional:      General: He is in acute distress.     Appearance: He is well-developed. He is ill-appearing.      Comments: Ill-appearing, respiratory distress, dyspneic with conversation, speaking in short phrases  HENT:     Head: Normocephalic and atraumatic.     Mouth/Throat:     Pharynx: No oropharyngeal exudate.  Eyes:     Conjunctiva/sclera: Conjunctivae normal.     Pupils: Pupils are equal, round, and reactive to light.  Neck:     Comments: No meningismus. Cardiovascular:     Rate and Rhythm: Regular rhythm. Tachycardia present.     Heart sounds: Normal heart sounds. No murmur heard.    Comments: Tachycardic 150s Pulmonary:     Effort: Pulmonary effort is normal. No respiratory distress.     Breath sounds: Normal breath sounds.  Abdominal:     Palpations: Abdomen is soft.     Tenderness: There is abdominal tenderness. There is no guarding or rebound.     Comments: Midline surgical incision appears to be healing well and appropriately tender  Musculoskeletal:        General: No tenderness. Normal range of motion.     Cervical back: Normal range of motion and neck supple.     Right lower leg: No edema.     Left lower leg: No edema.  Skin:    General: Skin is warm.  Neurological:     Mental Status: He is alert and oriented to person, place, and time.     Cranial Nerves: No cranial nerve deficit.     Motor: No abnormal muscle tone.     Coordination: Coordination normal.     Comments: No ataxia on finger to nose bilaterally. No pronator drift. 5/5 strength throughout. CN 2-12 intact.Equal grip strength. Sensation intact.   Psychiatric:        Behavior: Behavior normal.     ED Results / Procedures / Treatments   Labs (all labs ordered are listed, but only abnormal results are displayed) Labs Reviewed  CBC WITH DIFFERENTIAL/PLATELET - Abnormal; Notable for the following components:      Result Value   WBC 11.0 (*)    RBC 3.81 (*)    Hemoglobin 9.3 (*)    HCT 30.7 (*)    MCH 24.4 (*)    Neutro Abs 7.8 (*)    Monocytes Absolute 1.5 (*)    All other components within normal  limits  COMPREHENSIVE METABOLIC PANEL - Abnormal; Notable for the following components:   Potassium 3.4 (*)    Glucose, Bld 147 (*)    Calcium 8.5 (*)    Albumin 2.6 (*)    AST 12 (*)    All other components within normal limits  URINALYSIS, ROUTINE W REFLEX MICROSCOPIC - Abnormal; Notable for the following components:   Glucose, UA >=500 (*)  All other components within normal limits  CULTURE, BLOOD (ROUTINE X 2)  CULTURE, BLOOD (ROUTINE X 2)  BRAIN NATRIURETIC PEPTIDE  LEGIONELLA PNEUMOPHILA SEROGP 1 UR AG  STREP PNEUMONIAE URINARY ANTIGEN  HIV ANTIBODY (ROUTINE TESTING W REFLEX)  BASIC METABOLIC PANEL  MAGNESIUM  HEPARIN LEVEL (UNFRACTIONATED)  TROPONIN I (HIGH SENSITIVITY)  TROPONIN I (HIGH SENSITIVITY)    EKG None  Radiology CT Angio Chest PE W and/or Wo Contrast  Result Date: 12/19/2022 CLINICAL DATA:  Four days postoperative from robotic assisted laparoscopic left nephrectomy for carcinoma. Bronchial biopsy procedure also was done on 12/09/2022. Pulmonary embolism suspected, high probability. Abdominal pain also. EXAM: CT ANGIOGRAPHY CHEST CT ABDOMEN AND PELVIS WITH CONTRAST TECHNIQUE: Multidetector CT imaging of the chest was performed using the standard protocol during bolus administration of intravenous contrast. Multiplanar CT image reconstructions and MIPs were obtained to evaluate the vascular anatomy. Multidetector CT imaging of the abdomen and pelvis was performed using the standard protocol during bolus administration of intravenous contrast. RADIATION DOSE REDUCTION: This exam was performed according to the departmental dose-optimization program which includes automated exposure control, adjustment of the mA and/or kV according to patient size and/or use of iterative reconstruction technique. CONTRAST:  OMNIPAQUE IOHEXOL 350 MG/ML SOLN COMPARISON:  Portable chest today, portable chest 12/16/2022 and 12/09/2022, chest CT without contrast 12/07/2022, CTA chest  10/15/2022, MRI abdomen 11/15/2022 and CT abdomen and pelvis with contrast 10/28/2022. FINDINGS: CTA CHEST FINDINGS Cardiovascular: The pulmonary trunk is upper limit of normal in caliber at 2.8 cm, was previously 2.3 cm. There are bilateral acute arterial emboli, overall moderate clot burden. There is no increase in the RV/LV ratio and no significant IVC reflux pattern. No acute right heart strain is suspected. On the left, several subsegmental emboli are noted in the lingula and apical segments. There is thrombus in the distal left lower lobe main artery extending into all of the segmental arteries with nonocclusive thrombus, and in a few scattered downstream subsegmental arteries where there is probably occlusive thrombus in the some of the basal subsegments. On the right, there are scattered subsegmental arterial partially occlusive emboli in the anterior and apical segments. There are a few subsegmental emboli in the lateral and medial right middle lobe segments some of which appear occlusive. There are additional scattered subsegmental emboli in the basal segments of the lower lobe and in the superior segment. There is no pericardial effusion. There is mild cardiomegaly with a left chamber predominance, interval increased. There is trace calcification in the LAD coronary artery. Thoracic aorta is tortuous, minimal calcific plaque in the distal arch is seen without aneurysm, stenosis or dissection. The great vessels are clear.  Pulmonary veins are nondistended. Mediastinum/Nodes: There is extensive pneumomediastinum which is primarily anterior, some of this extending into the right paratracheal and left-to-right lateral prevascular spaces. This probably tracked up from the abdomen where there is free intraperitoneal air presumably from the recent surgery. Esophageal thickness is normal. None of the air is seen around the esophagus. The trachea and main bronchi are clear. Unremarkable thyroid gland. No axillary  or intrathoracic adenopathy. Lungs/Pleura: Small layering left pleural effusion. No right pleural effusion. No pneumothorax. There is posterior atelectasis in both lungs. New finding of patchy dense consolidation in the left lower lobe basal segments, focal consolidation in the superior segment of the right lower lobe findings could be due to pneumonia, infarctions or combination. No interstitial edema is seen. Remaining lungs are generally clear. 8 mm left lower lobe nodule noted  previously is obscured due to left lung consolidation. Musculoskeletal: No aggressive bone lesion is seen. Review of the MIP images confirms the above findings. CT ABDOMEN and PELVIS FINDINGS Hepatobiliary: There is a small stone in the distal gallbladder. No wall thickening or bile duct dilatation. There is no liver mass enhancement. Pancreas: No abnormality. Spleen: No abnormality. Adrenals/Urinary Tract: Interval left nephroadrenalectomy. There are scattered retroperitoneal air pockets in the operative bed. There is mild low-density fluid settling posteriorly in the left renal fossa, with low-density fluid measuring 7.4 x 2.8 x 12.4 cm. This is probably a postoperative seroma or residual postoperative fluid. There is no right adrenal or renal mass enhancement. There is a 5 mm nonobstructive stone in the posterior right kidney. No obstructing stone is evident. There is mild bladder thickening versus underdistention. Correlate with urinalysis. Stomach/Bowel: Chronic gastric fold thickening. Likely chronic gastritis. The appendix and bowel are unremarkable. Vascular/Lymphatic: There is a small volume of thrombus in the IVC extending into it from the ligated left renal vein. On the MRI 11/15/2022 there was enhancing tumor thrombus in the left renal vein but not in the IVC. This could be a small tumor thrombus. Also not entirely certain some of the bilateral pulmonary arterial emboli are not tumor thrombus as well. No other significant  vascular findings. There previously was left periaortic chain adenopathy but this has been removed. Reproductive: Uterus and bilateral adnexa are unremarkable. Other: There is extensive pneumoperitoneum, but this is almost all anterior. Some of it tracks along the left dorsal retroperitoneal planes but there are no air pockets along side of the bowel. This is probably postoperative free air, with hollow viscous perforation not strictly excluded. There is additional moderately extensive left abdominal wall emphysema, tracking into the groin areas. Some of the intraperitoneal air tracks into the inguinal canals and left hemiscrotum. No abscess is seen. A small amount of free fluid of low-density collects in the posterior right deep pelvis. Additional scattered fluid extends along the left retroperitoneum anterior to the aorta. Musculoskeletal: No aggressive regional osseous lesions. Review of the MIP images confirms the above findings. IMPRESSION: 1. Bilateral acute arterial emboli with overall moderate clot burden but no findings of acute right heart strain. Possible at least some of the embolic disease could be tumor thrombus. 2. Mild cardiomegaly with trace calcification in the LAD coronary artery. 3. Small left pleural effusion. 4. Bilateral lower lobe consolidations, left greater than right, could be due to pneumonia, infarctions or combination. 5. Extensive pneumomediastinum, and extensive left abdominal wall emphysema tracking into the inguinal canals and left hemiscrotum. 6. Small volume of thrombus in the IVC extending into it from the ligated left renal vein. On the MRI 11/15/2022 there was enhancing tumor thrombus in the left renal vein but not in the IVC. This could be a small tumor thrombus. 7. Interval left nephroadrenalectomy with low-density fluid settling posteriorly in the left renal fossa measuring 7.4 x 2.8 x 12.4 cm. This is probably a postoperative seroma or residual postoperative fluid. 8.  Extensive pneumoperitoneum, almost all of which is anterior. This is probably postoperative free air, with hollow viscus perforation not strictly excluded. 9. Small amount of low-density free fluid in the posterior right deep pelvis. 10. Cholelithiasis. 11. Nonobstructive nephrolithiasis. 12. Aortic atherosclerosis.  Remaining findings described above. 13. Critical Value/emergent results were called by telephone at the time of interpretation on 12/19/2022 at 2:06 am to provider [facility] , who verbally acknowledged these results. Aortic Atherosclerosis (ICD10-I70.0). Electronically Signed  By: Almira Bar M.D.   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 Bronchial biopsy procedure also was done on 12/09/2022. Pulmonary embolism suspected, high probability. Abdominal pain also. EXAM: CT ANGIOGRAPHY CHEST CT ABDOMEN AND PELVIS WITH CONTRAST TECHNIQUE: Multidetector CT imaging of the chest was performed using the standard protocol during bolus administration of intravenous contrast. Multiplanar CT image reconstructions and MIPs were obtained to evaluate the vascular anatomy. Multidetector CT imaging of the abdomen and pelvis was performed using the standard protocol during bolus administration of intravenous contrast. RADIATION DOSE REDUCTION: This exam was performed according to the departmental dose-optimization program which includes automated exposure control, adjustment of the mA and/or kV according to patient size and/or use of iterative reconstruction technique. CONTRAST:  OMNIPAQUE IOHEXOL 350 MG/ML SOLN COMPARISON:  Portable chest today, portable chest 12/16/2022 and 12/09/2022, chest CT without contrast 12/07/2022, CTA chest 10/15/2022, MRI abdomen 11/15/2022 and CT abdomen and pelvis with contrast 10/28/2022. FINDINGS: CTA CHEST FINDINGS  Cardiovascular: The pulmonary trunk is upper limit of normal in caliber at 2.8 cm, was previously 2.3 cm. There are bilateral acute arterial emboli, overall moderate clot burden. There is no increase in the RV/LV ratio and no significant IVC reflux pattern. No acute right heart strain is suspected. On the left, several subsegmental emboli are noted in the lingula and apical segments. There is thrombus in the distal left lower lobe main artery extending into all of the segmental arteries with nonocclusive thrombus, and in a few scattered downstream subsegmental arteries where there is probably occlusive thrombus in the some of the basal subsegments. On the right, there are scattered subsegmental arterial partially occlusive emboli in the anterior and apical segments. There are a few subsegmental emboli in the lateral and medial right middle lobe segments some of which appear occlusive. There are additional scattered subsegmental emboli in the basal segments of the lower lobe and in the superior segment. There is no pericardial effusion. There is mild cardiomegaly with a left chamber predominance, interval increased. There is trace calcification in the LAD coronary artery. Thoracic aorta is tortuous, minimal calcific plaque in the distal arch is seen without aneurysm, stenosis or dissection. The great vessels are clear.  Pulmonary veins are nondistended. Mediastinum/Nodes: There is extensive pneumomediastinum which is primarily anterior, some of this extending into the right paratracheal and left-to-right lateral prevascular spaces. This probably tracked up from the abdomen where there is free intraperitoneal air presumably from the recent surgery. Esophageal thickness is normal. None of the air is seen around the esophagus. The trachea and main bronchi are clear. Unremarkable thyroid gland. No axillary or intrathoracic adenopathy. Lungs/Pleura: Small layering left pleural effusion. No right pleural effusion. No  pneumothorax. There is posterior atelectasis in both lungs. New finding of patchy dense consolidation in the left lower lobe basal segments, focal consolidation in the superior segment of the right lower lobe findings could be due to pneumonia, infarctions or combination. No interstitial edema is seen. Remaining lungs are generally clear. 8 mm left lower lobe nodule noted previously is obscured due to left lung consolidation. Musculoskeletal: No aggressive bone lesion is seen. Review of the MIP images confirms the above findings. CT ABDOMEN and PELVIS FINDINGS Hepatobiliary: There is a small stone in the distal gallbladder. No wall thickening or bile duct dilatation. There is no liver mass enhancement. Pancreas: No abnormality. Spleen: No abnormality. Adrenals/Urinary  Tract: Interval left nephroadrenalectomy. There are scattered retroperitoneal air pockets in the operative bed. There is mild low-density fluid settling posteriorly in the left renal fossa, with low-density fluid measuring 7.4 x 2.8 x 12.4 cm. This is probably a postoperative seroma or residual postoperative fluid. There is no right adrenal or renal mass enhancement. There is a 5 mm nonobstructive stone in the posterior right kidney. No obstructing stone is evident. There is mild bladder thickening versus underdistention. Correlate with urinalysis. Stomach/Bowel: Chronic gastric fold thickening. Likely chronic gastritis. The appendix and bowel are unremarkable. Vascular/Lymphatic: There is a small volume of thrombus in the IVC extending into it from the ligated left renal vein. On the MRI 11/15/2022 there was enhancing tumor thrombus in the left renal vein but not in the IVC. This could be a small tumor thrombus. Also not entirely certain some of the bilateral pulmonary arterial emboli are not tumor thrombus as well. No other significant vascular findings. There previously was left periaortic chain adenopathy but this has been removed. Reproductive:  Uterus and bilateral adnexa are unremarkable. Other: There is extensive pneumoperitoneum, but this is almost all anterior. Some of it tracks along the left dorsal retroperitoneal planes but there are no air pockets along side of the bowel. This is probably postoperative free air, with hollow viscous perforation not strictly excluded. There is additional moderately extensive left abdominal wall emphysema, tracking into the groin areas. Some of the intraperitoneal air tracks into the inguinal canals and left hemiscrotum. No abscess is seen. A small amount of free fluid of low-density collects in the posterior right deep pelvis. Additional scattered fluid extends along the left retroperitoneum anterior to the aorta. Musculoskeletal: No aggressive regional osseous lesions. Review of the MIP images confirms the above findings. IMPRESSION: 1. Bilateral acute arterial emboli with overall moderate clot burden but no findings of acute right heart strain. Possible at least some of the embolic disease could be tumor thrombus. 2. Mild cardiomegaly with trace calcification in the LAD coronary artery. 3. Small left pleural effusion. 4. Bilateral lower lobe consolidations, left greater than right, could be due to pneumonia, infarctions or combination. 5. Extensive pneumomediastinum, and extensive left abdominal wall emphysema tracking into the inguinal canals and left hemiscrotum. 6. Small volume of thrombus in the IVC extending into it from the ligated left renal vein. On the MRI 11/15/2022 there was enhancing tumor thrombus in the left renal vein but not in the IVC. This could be a small tumor thrombus. 7. Interval left nephroadrenalectomy with low-density fluid settling posteriorly in the left renal fossa measuring 7.4 x 2.8 x 12.4 cm. This is probably a postoperative seroma or residual postoperative fluid. 8. Extensive pneumoperitoneum, almost all of which is anterior. This is probably postoperative free air, with hollow  viscus perforation not strictly excluded. 9. Small amount of low-density free fluid in the posterior right deep pelvis. 10. Cholelithiasis. 11. Nonobstructive nephrolithiasis. 12. Aortic atherosclerosis.  Remaining findings described above. 13. Critical Value/emergent results were called by telephone at the time of interpretation on 12/19/2022 at 2:06 am to provider [facility] , who verbally acknowledged these results. Aortic Atherosclerosis (ICD10-I70.0). Electronically Signed   By: Almira Bar M.D.   On: 12/19/2022 02:50   DG Chest Portable 1 View  Result Date: 12/19/2022 CLINICAL DATA:  Shortness of breath EXAM: PORTABLE CHEST 1 VIEW COMPARISON:  12/16/2022 FINDINGS: Continued free air under the right hemi diets crash that continued free air under the hemidiaphragms, likely related to recent surgery. Low lung  volumes with bibasilar atelectasis and vascular congestion. Heart and mediastinal contours within normal limits. No visible effusions or pneumothorax. IMPRESSION: Low lung volumes, bibasilar atelectasis, vascular congestion. Pneumoperitoneum, likely related to recent laparoscopic surgery. Electronically Signed   By: Charlett Nose M.D.   On: 12/19/2022 00:32    Procedures .Critical Care  Performed by: Glynn Octave, MD Authorized by: Glynn Octave, MD   Critical care provider statement:    Critical care time (minutes):  45   Critical care time was exclusive of:  Separately billable procedures and treating other patients   Critical care was necessary to treat or prevent imminent or life-threatening deterioration of the following conditions:  Respiratory failure and sepsis   Critical care was time spent personally by me on the following activities:  Development of treatment plan with patient or surrogate, discussions with consultants, evaluation of patient's response to treatment, examination of patient, ordering and review of laboratory studies, ordering and review of radiographic  studies, ordering and performing treatments and interventions, pulse oximetry, re-evaluation of patient's condition, review of old charts, blood draw for specimens and obtaining history from patient or surrogate   I assumed direction of critical care for this patient from another provider in my specialty: no       Medications Ordered in ED Medications - No data to display  ED Course/ Medical Decision Making/ A&P                             Medical Decision Making Amount and/or Complexity of Data Reviewed Independent Historian: EMS Labs: ordered. Decision-making details documented in ED Course. Radiology: ordered and independent interpretation performed. Decision-making details documented in ED Course. ECG/medicine tests: ordered and independent interpretation performed. Decision-making details documented in ED Course.  Risk Prescription drug management. Decision regarding hospitalization.  Progressively worsening shortness of breath with recent surgery.  Tachycardic on arrival with dyspnea and pleuritic back pain.  High probability for pulmonary embolism.  Patient found to be febrile and tachycardic.  He is given IV fluids and antibiotics after cultures are obtained.  He states he has had chronic shortness of breath and tachycardia for several months.  Chest x-ray shows no pneumothorax or infiltrate.  Does show some vascular congestion and pneumoperitoneum in setting of recent surgery.  Results reviewed interpreted by me.  High concern for pulmonary embolism.  This is positive on CT scan.  Discussed with Dr. Raynelle Dick of radiology.  Bilateral pulmonary emboli without right heart strain.  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  Also with pneumoperitoneum likely secondary to recent surgery. Study also concerning for bilateral airspace disease with pleural effusions.  Patient is febrile.  He was treated for hospital-acquired pneumonia with  broad-spectrum antibiotics after blood cultures obtained.  Discussed with Dr. Delanna Ahmadi of urology.  He agrees benefits of heparin outweigh risk at this point given stable hemoglobin.  Pneumoperitoneum and pneumomediastinum favored to be expected postoperative findings with low suspicion for intra-abdominal or bowel injury. He will consult on patient later this morning.  Remains tachycardic in the 120s.  Fever has improved.  Blood pressure 147/93.  Initiate IV heparin.  Continue IV antibiotics.  Plan admission.  D/w Dr. Antionette Char.        Final Clinical Impression(s) / ED Diagnoses Final diagnoses:  Multiple subsegmental pulmonary emboli without acute cor pulmonale (HCC)  Sepsis with acute hypoxic respiratory failure without septic shock, due to unspecified organism (  [facility])    Rx / DC Orders ED Discharge Orders     None         Jacque Garrels, Jeannett Senior, MD 12/19/22 0345

## 2022-12-19 NOTE — Progress Notes (Addendum)
Patients heart rate sustaining in 120's to 130's, MD made aware no new orders placed at this time.

## 2022-12-19 NOTE — ED Provider Notes (Incomplete)
[place] EMERGENCY DEPARTMENT AT [facility] Provider Note   CSN: [id number] Arrival date & time: 12/18/22  2338     History {Add pertinent medical, surgical, social history, OB history to HPI:1} Chief Complaint  Patient presents with  . Shortness of Breath    Paul Flowers is a 52 y.o. male.  Level 5 via respiratory distress.  Patient here with shortness of breath suddenly worsened tonight about 9 PM.  States has been short of breath since April with no clear diagnosis.  No history of COPD or asthma.  Does not smoke.  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  States he is now cancer free.  Believes he had a fever last night around 101.  Breathing became worse today with nonproductive cough.  Difficulty lying flat which is his baseline.  EMS reports clear lungs with tachycardia in the 150s.  He denies any chest pain but does have upper back pain or rib pain with deep breathing.  Having his abdominal tenderness at the site of his surgery.  No vomiting.  No leg pain or leg swelling.  No history of CHF.  No history of DVT or PE.  The history is provided by the patient and the EMS personnel. The history is limited by the condition of the patient.  Shortness of Breath      Home Medications Prior to Admission medications   Medication Sig Start Date End Date Taking? Authorizing Provider  atorvastatin (LIPITOR) 20 MG tablet TAKE 1 TABLET BY MOUTH EVERY DAY 03/12/22   Bedsole, Amy E, MD  docusate sodium (COLACE) 100 MG capsule Take 1 capsule (100 mg total) by mouth 2 (two) times daily. 12/15/22   Harrie Foreman, PA-C  ferrous gluconate (FERGON) 324 MG tablet Take 1 tablet (324 mg total) by mouth daily with breakfast. Patient not taking: Reported on 12/01/2022 11/02/22 01/31/23  Michaelyn Barter, MD  fluticasone [facility]) 50 MCG/ACT nasal spray Place 1 spray into both nostrils daily. 11/25/22   Raechel Chute, MD   HYDROcodone-acetaminophen (NORCO) 5-325 MG tablet Take 1-2 tablets by mouth every 6 (six) hours as needed for moderate pain or severe pain. 12/15/22   Harrie Foreman, PA-C  losartan (COZAAR) 25 MG tablet TAKE 1 TABLET BY MOUTH EVERY DAY 02/28/22   Bedsole, Amy E, MD  montelukast (SINGULAIR) 10 MG tablet TAKE 1 TABLET BY MOUTH EVERYDAY AT BEDTIME Patient taking differently: Take 10 mg by mouth daily. TAKE 1 TABLET BY MOUTH EVERYDAY 03/12/22   Bedsole, Amy E, MD  omeprazole (PRILOSEC) 20 MG capsule TAKE 1 CAPSULE BY MOUTH EVERY DAY 08/27/22   Ermalene Searing, Amy E, MD  ONE TOUCH LANCETS MISC Use to test blood sugar once daily 08/23/16   Reather Littler, MD  sodium chloride (OCEAN) 0.65 % SOLN nasal spray Place 1 spray into both nostrils in the morning and at bedtime. 11/25/22   Raechel Chute, MD  XIGDUO XR 11-998 MG TB24 TAKE 1 TABLET BY MOUTH EVERY DAY 12/15/22   Reather Littler, MD      Allergies    Patient has no allergy information on record.    Review of Systems   Review of Systems  Respiratory:  Positive for shortness of breath.     Physical Exam Updated Vital Signs BP (!) 157/106 (BP Location: Left Arm)   Pulse (!) 135   Temp 98 F (36.7 C) (Oral)   Resp 20   SpO2 99%  Physical  Exam  ED Results / Procedures / Treatments   Labs (all labs ordered are listed, but only abnormal results are displayed) Labs Reviewed  CULTURE, BLOOD (ROUTINE X 2)  CULTURE, BLOOD (ROUTINE X 2)  CBC WITH DIFFERENTIAL/PLATELET  COMPREHENSIVE METABOLIC PANEL  BRAIN NATRIURETIC PEPTIDE  URINALYSIS, ROUTINE W REFLEX MICROSCOPIC  TROPONIN I (HIGH SENSITIVITY)    EKG None  Radiology No results found.  Procedures Procedures  {Document cardiac monitor, telemetry assessment procedure when appropriate:1}  Medications Ordered in ED Medications - No data to display  ED Course/ Medical Decision Making/ A&P   {   Click here for ABCD2, HEART and other calculatorsREFRESH Note before signing :1}                           Medical Decision Making Amount and/or Complexity of Data Reviewed Labs: ordered. Radiology: ordered. ECG/medicine tests: ordered.   ***  {Document critical care time when appropriate:1} {Document review of labs and clinical decision tools ie heart score, Chads2Vasc2 etc:1}  {Document your independent review of radiology images, and any outside records:1} {Document your discussion with family members, caretakers, and with consultants:1} {Document social determinants of health affecting pt's care:1} {Document your decision making why or why not admission, treatments were needed:1} Final Clinical Impression(s) / ED Diagnoses Final diagnoses:  None    Rx / DC Orders ED Discharge Orders     None

## 2022-12-19 NOTE — Progress Notes (Signed)
   12/19/22 2200  BiPAP/CPAP/SIPAP  Reason BIPAP/CPAP not in use  (patient prefers not to wear one tonight. On 2L [place])
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
## 2022-12-19 NOTE — H&P (Signed)
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 recent surgery. Low lung volumes with bibasilar atelectasis and vascular congestion. Heart and mediastinal contours within normal limits. No visible effusions or pneumothorax. IMPRESSION: Low lung volumes, bibasilar atelectasis, vascular congestion. Pneumoperitoneum, likely related to recent laparoscopic surgery. Electronically Signed   By: Charlett Nose M.D.   On: 12/19/2022 00:32    EKG: Independently reviewed. Sinus tachycardia, rate 133.   Assessment/Plan   1. Acute bilateral PE   - No evidence for heart strain on CT, BP has been stable, and BNP and troponin are normal  - Continue IV heparin, check LE venous US       2. Pneumonia  - Bilateral lower lobe consolidations noted on CT in ED, could reflect infarctions but with the fever, plan to treat with Rocephin and azithromycin, check strep pneumo and legionella antigens, follow cultures and clinical course    3. 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 Alena Bills of oncology at [facility]   4. Type II DM  - A1c was 7.7% in April 2024  - Check CBGs and use low-intensity SSI for now   5. Hypertension   - Losartan    6. Hyperlipidemia  - Lipitor   7. OSA  - CPAP at bedtime    DVT prophylaxis: IV heparin  Code Status: Full  Level of Care: Level of care: Progressive Family Communication: Wife at bedside   Disposition Plan:  Patient is from: home  Anticipated d/c is to: Home  Anticipated d/c date is: 12/22/22  Patient currently: pending improvement in respiratory status and transition to oral anticoagulant and oral antibiotics  Consults called: urology consulted by ED physician  Admission status: Inpatient     Briscoe Deutscher, MD [facility]  12/19/2022, 3:06 AM
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
## 2022-12-20 DIAGNOSIS — I2694 Multiple subsegmental pulmonary emboli without acute cor pulmonale: Secondary | ICD-10-CM | POA: Diagnosis not present

## 2022-12-20 LAB — LEGIONELLA PNEUMOPHILA SEROGP 1 UR AG: L. pneumophila Serogp 1 Ur Ag: NEGATIVE

## 2022-12-20 LAB — GLUCOSE, CAPILLARY
Glucose-Capillary: 165 mg/dL — ABNORMAL HIGH (ref 70–99)
Glucose-Capillary: 108 mg/dL — ABNORMAL HIGH (ref 70–99)
Glucose-Capillary: 82 mg/dL (ref 70–99)
Glucose-Capillary: 142 mg/dL — ABNORMAL HIGH (ref 70–99)

## 2022-12-20 LAB — CULTURE, BLOOD (ROUTINE X 2)

## 2022-12-20 LAB — HEPARIN LEVEL (UNFRACTIONATED): Heparin Unfractionated: 0.39 IU/mL (ref 0.30–0.70)

## 2022-12-20 LAB — BASIC METABOLIC PANEL
Anion gap: 9 (ref 5–15)
BUN: 9 mg/dL (ref 6–20)
CO2: 26 mmol/L (ref 22–32)
Calcium: 8.4 mg/dL — ABNORMAL LOW (ref 8.9–10.3)
Chloride: 103 mmol/L (ref 98–111)
Creatinine, Ser: 0.99 mg/dL (ref 0.61–1.24)
GFR, Estimated: >60 mL/min (ref >60)
Glucose, Bld: 158 mg/dL — ABNORMAL HIGH (ref 70–99)
Potassium: 3.2 mmol/L — ABNORMAL LOW (ref 3.5–5.1)
Sodium: 138 mmol/L (ref 135–145)

## 2022-12-20 LAB — CBC
HCT: 26.7 % — ABNORMAL LOW (ref 39.0–52.0)
Hemoglobin: 8.3 g/dL — ABNORMAL LOW (ref 13.0–17.0)
MCH: 24.9 pg — ABNORMAL LOW (ref 26.0–34.0)
MCHC: 31.1 g/dL (ref 30.0–36.0)
MCV: 80.2 fL (ref 80.0–100.0)
Platelets: 259 10*3/uL (ref 150–400)
RBC: 3.33 MIL/uL — ABNORMAL LOW (ref 4.22–5.81)
RDW: 14.5 % (ref 11.5–15.5)
WBC: 10 10*3/uL (ref 4.0–10.5)
nRBC: 0 % (ref 0.0–0.2)

## 2022-12-20 LAB — PROTEIN C, TOTAL: Protein C, Total: 93 % (ref 60–150)

## 2022-12-20 LAB — HOMOCYSTEINE: Homocysteine: 12.8 umol/L (ref 0.0–14.5)

## 2022-12-20 LAB — BETA-2-GLYCOPROTEIN I ABS, IGG/M/A
Beta-2 Glyco I IgG: <9 GPI IgG units (ref 0–20)
Beta-2-Glycoprotein I IgA: <9 GPI IgA units (ref 0–25)
Beta-2-Glycoprotein I IgM: <9 GPI IgM units (ref 0–32)

## 2022-12-20 MED ORDER — APIXABAN 5 MG PO TABS: 5.0000 mg | ORAL_TABLET | Freq: Two times a day (BID) | ORAL | Status: DC

## 2022-12-20 MED ORDER — APIXABAN 5 MG PO TABS
10.0000 mg | ORAL_TABLET | Freq: Two times a day (BID) | ORAL | Status: DC
  Administered 2022-12-20 – 2022-12-25 (×11): 10 mg via ORAL
  Filled 2022-12-20 (×11): qty 2

## 2022-12-20 NOTE — TOC Initial Note (Addendum)
Transition of Care [facility]) - Initial/Assessment Note    Patient Details  Name: Paul Flowers MRN: [id number] Date of Birth: [birth year]  [facility] CM/SW Contact:    Howell Rucks, RN Phone Number: 12/20/2022, 11:59 AM  Clinical Narrative:  Met with pt at beside to introduce role of TOC/NCM and review for dc planning, pt recent admission for schedule surgery, readmit with PE. Pt reports he has PCP and pharmacy in place, family support at discharge,  no home DME or home health services at this time, pt reports he has transportation in place at discharge. TOC consult received for Medication copay for Eliquis. NCM discussed with pt he will receive a 30 day supply of Eliquis at discharge from the hospital.  Benefit check for insurance copay-$60.00, pt voiced understanding. Pt had no further questions/concerns.                         Patient Goals and CMS Choice            Expected Discharge Plan and Services                                              Prior Living Arrangements/Services                       Activities of Daily Living Home Assistive Devices/Equipment: None ADL Screening (condition at time of admission) Patient's cognitive ability adequate to safely complete daily activities?: Yes Is the patient deaf or have difficulty hearing?: No Does the patient have difficulty seeing, even when wearing glasses/contacts?: No Does the patient have difficulty concentrating, remembering, or making decisions?: No Patient able to express need for assistance with ADLs?: Yes Does the patient have difficulty dressing or bathing?: No Independently performs ADLs?: Yes (appropriate for developmental age) Does the patient have difficulty walking or climbing stairs?: No Weakness of Legs: None Weakness of Arms/Hands: None  Permission Sought/Granted                  Emotional Assessment              Admission diagnosis:  Acute pulmonary  embolism (HCC) [I26.99] Sepsis with acute hypoxic respiratory failure without septic shock, due to unspecified organism (HCC) [A41.9, R65.20, J96.01] Multiple subsegmental pulmonary emboli without acute cor pulmonale (HCC) [I26.94] Patient Active Problem List   Diagnosis Date Noted   Multiple subsegmental pulmonary emboli without acute cor pulmonale (HCC) 12/19/2022   Pneumonia 12/19/2022   Renal mass 12/15/2022   Lung nodule 11/25/2022   Iron deficiency anemia 11/02/2022   Shortness of breath 11/02/2022   Left kidney mass 10/19/2022   Nodule of lower lobe of left lung 10/19/2022   Anemia 10/19/2022   Other fatigue 10/14/2022   Chronic right-sided low back pain with right-sided sciatica 01/21/2022   Muscle spasm 01/21/2022   Tachycardia 06/12/2021   Multiple abrasions 12/23/2020   Current moderate episode of major depressive disorder without prior episode (HCC) 03/28/2019   Urinary frequency 07/26/2016   Hyperlipidemia associated with type 2 diabetes mellitus (HCC) 06/11/2015   Renal cyst, left, bosniack 1 12/03/2014   Controlled type 2 diabetes mellitus without complication, without long-term current use of insulin (HCC) 09/24/2014   Hypertension associated with diabetes (HCC) 11/09/2013   OSA (obstructive sleep apnea) 03/19/2011   Persistent  cough for 3 weeks or longer 05/03/2008   Allergic rhinitis 01/19/2007   GERD 01/19/2007   PCP:  Excell Seltzer, MD Pharmacy:   [facility] [phone] - [address], [place] - [address] [place] [address] Phone: [phone] Fax: [phone]  [id number] - [place],  - [address] AT [facility] [address] [place] [address] Phone: [phone] Fax: [phone]     Social Determinants of Health (SDOH) Social History: SDOH Screenings   Food Insecurity: No Food Insecurity (12/19/2022)  Housing: Low Risk  (12/19/2022)  Transportation Needs: No  Transportation Needs (12/19/2022)  Utilities: Not At Risk (12/19/2022)  Alcohol Screen: Low Risk  (10/14/2022)  Depression (PHQ2-9): Low Risk  (07/19/2022)  Financial Resource Strain: Low Risk  (10/14/2022)  Physical Activity: Insufficiently Active (10/14/2022)  Social Connections: Moderately Integrated (10/14/2022)  Stress: No Stress Concern Present (10/14/2022)  Tobacco Use: Low Risk  (12/19/2022)   SDOH Interventions:     Readmission Risk Interventions    12/20/2022   11:59 AM 12/16/2022    9:11 AM  Readmission Risk Prevention Plan  Post Dischage Appt  Complete  Medication Screening  Complete  Transportation Screening Complete Complete  PCP or Specialist Appt within 5-7 Days Complete   Home Care Screening Complete   Medication Review (RN CM) Complete

## 2022-12-20 NOTE — Progress Notes (Signed)
Subjective: NAEO. Feels much improved from yesterday. Cough is subsiding.  Objective: Vital signs in last 24 hours: Temp:  [98.6 F (37 C)-100.6 F (38.1 C)] 99 F (37.2 C) (06/17 0415) Pulse Rate:  [103-126] 103 (06/17 0415) Resp:  [20-24] 20 (06/17 0415) BP: (118-155)/(79-92) 120/79 (06/17 0415) SpO2:  [96 %-100 %] 100 % (06/17 0415) Weight:  [77.8 kg] 77.8 kg (06/17 0416)  Intake/Output from previous day: 06/16 0701 - 06/17 0700 In: 1416.2 [P.O.:1080; I.V.:336.2] Out: -  Intake/Output this shift: No intake/output data recorded.  Physical Exam:  General: Alert and oriented CV: RRR Lungs: Clear Abdomen: Soft, ND Incisions: c/d/i Ext: NT, No erythema  Lab Results: Recent Labs    12/19/22 0002 12/20/22 0438  HGB 9.3* 8.3*  HCT 30.7* 26.7*   BMET Recent Labs    12/19/22 0533 12/20/22 0438  NA 137 138  K 3.3* 3.2*  CL 104 103  CO2 24 26  GLUCOSE 114* 158*  BUN 10 9  CREATININE 0.90 0.99  CALCIUM 8.1* 8.4*     Studies/Results: VAS [place] LOWER EXTREMITY VENOUS (DVT)  Result Date: 12/19/2022  Lower Venous DVT Study Patient Name:  Paul Flowers  Date of Exam:   12/19/2022 Medical Rec #: [id number]       Accession #:    [id number] Date of Birth: 1971-01-26       Patient Gender: M Patient Age:   52 years Exam Location:  [facility] Procedure:      VAS [place] LOWER EXTREMITY VENOUS (DVT) Referring Phys: Marcial Pacas OPYD --------------------------------------------------------------------------------  Indications: Pulmonary embolism. 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 Comparison Study: No prior studies. Performing Technologist: Jean Rosenthal RDMS, RVT  Examination Guidelines: A complete evaluation includes B-mode imaging, spectral Doppler, color Doppler, and power Doppler as needed of all accessible portions of each vessel. Bilateral testing is considered an integral  part of a complete examination. Limited examinations for reoccurring indications may be performed as noted. The reflux portion of the exam is performed with the patient in reverse Trendelenburg.  +---------+---------------+---------+-----------+----------+--------------+ RIGHT    CompressibilityPhasicitySpontaneityPropertiesThrombus Aging +---------+---------------+---------+-----------+----------+--------------+ CFV      Full           Yes                                          +---------+---------------+---------+-----------+----------+--------------+ SFJ      Full                                                        +---------+---------------+---------+-----------+----------+--------------+ FV Prox  Full                                                        +---------+---------------+---------+-----------+----------+--------------+ FV Mid   Full                                                        +---------+---------------+---------+-----------+----------+--------------+  FV DistalFull           Yes      Yes                                 +---------+---------------+---------+-----------+----------+--------------+ PFV      Full                                                        +---------+---------------+---------+-----------+----------+--------------+ POP      Full           Yes      Yes                                 +---------+---------------+---------+-----------+----------+--------------+ PTV      Full                                                        +---------+---------------+---------+-----------+----------+--------------+ PERO     Full                                                        +---------+---------------+---------+-----------+----------+--------------+   +---------+---------------+---------+-----------+----------+--------------+ LEFT     CompressibilityPhasicitySpontaneityPropertiesThrombus Aging  +---------+---------------+---------+-----------+----------+--------------+ CFV      Full           Yes      Yes                                 +---------+---------------+---------+-----------+----------+--------------+ SFJ      Full                                                        +---------+---------------+---------+-----------+----------+--------------+ FV Prox  Full                                                        +---------+---------------+---------+-----------+----------+--------------+ FV Mid   Full                                                        +---------+---------------+---------+-----------+----------+--------------+ FV DistalFull           Yes      Yes                                 +---------+---------------+---------+-----------+----------+--------------+  PFV      Full                                                        +---------+---------------+---------+-----------+----------+--------------+ POP      Full           Yes      Yes                                 +---------+---------------+---------+-----------+----------+--------------+ PTV      Full                                                        +---------+---------------+---------+-----------+----------+--------------+ PERO     Full                                                        +---------+---------------+---------+-----------+----------+--------------+    Summary: RIGHT: - There is no evidence of deep vein thrombosis in the lower extremity.  - No cystic structure found in the popliteal fossa.  LEFT: - There is no evidence of deep vein thrombosis in the lower extremity.  - No cystic structure found in the popliteal fossa.  *See table(s) above for measurements and observations.    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 Bronchial biopsy procedure also was done on 12/09/2022. Pulmonary embolism suspected, high probability. Abdominal pain also. EXAM: CT ANGIOGRAPHY CHEST CT ABDOMEN AND PELVIS WITH CONTRAST TECHNIQUE: Multidetector CT imaging of the chest was performed using the standard protocol during bolus administration of intravenous contrast. Multiplanar CT image reconstructions and MIPs were obtained to evaluate the vascular anatomy. Multidetector CT imaging of the abdomen and pelvis was performed using the standard protocol during bolus administration of intravenous contrast. RADIATION DOSE REDUCTION: This exam was performed according to the departmental dose-optimization program which includes automated exposure control, adjustment of the mA and/or kV according to patient size and/or use of iterative reconstruction technique. CONTRAST:  OMNIPAQUE IOHEXOL 350 MG/ML SOLN COMPARISON:  Portable chest today, portable chest 12/16/2022 and 12/09/2022, chest CT without contrast 12/07/2022, CTA chest 10/15/2022, MRI abdomen 11/15/2022 and CT abdomen and pelvis with contrast 10/28/2022. FINDINGS: CTA CHEST FINDINGS Cardiovascular: The pulmonary trunk is upper limit of normal in caliber at 2.8 cm, was previously 2.3 cm. There are bilateral acute arterial emboli, overall moderate clot burden. There is no increase in the RV/LV ratio and no significant IVC reflux pattern. No acute right heart strain is suspected. On the left, several subsegmental emboli are noted in the lingula and apical segments. There is thrombus in the distal left lower lobe main artery extending into all of the segmental arteries with nonocclusive thrombus, and in a few scattered downstream subsegmental arteries where there is probably occlusive thrombus in the some of the basal subsegments. On the  right, there are scattered subsegmental arterial partially occlusive emboli in the anterior and apical segments. There are a few  subsegmental emboli in the lateral and medial right middle lobe segments some of which appear occlusive. There are additional scattered subsegmental emboli in the basal segments of the lower lobe and in the superior segment. There is no pericardial effusion. There is mild cardiomegaly with a left chamber predominance, interval increased. There is trace calcification in the LAD coronary artery. Thoracic aorta is tortuous, minimal calcific plaque in the distal arch is seen without aneurysm, stenosis or dissection. The great vessels are clear.  Pulmonary veins are nondistended. Mediastinum/Nodes: There is extensive pneumomediastinum which is primarily anterior, some of this extending into the right paratracheal and left-to-right lateral prevascular spaces. This probably tracked up from the abdomen where there is free intraperitoneal air presumably from the recent surgery. Esophageal thickness is normal. None of the air is seen around the esophagus. The trachea and main bronchi are clear. Unremarkable thyroid gland. No axillary or intrathoracic adenopathy. Lungs/Pleura: Small layering left pleural effusion. No right pleural effusion. No pneumothorax. There is posterior atelectasis in both lungs. New finding of patchy dense consolidation in the left lower lobe basal segments, focal consolidation in the superior segment of the right lower lobe findings could be due to pneumonia, infarctions or combination. No interstitial edema is seen. Remaining lungs are generally clear. 8 mm left lower lobe nodule noted previously is obscured due to left lung consolidation. Musculoskeletal: No aggressive bone lesion is seen. Review of the MIP images confirms the above findings. CT ABDOMEN and PELVIS FINDINGS Hepatobiliary: There is a small stone in the distal gallbladder. No wall thickening or bile duct dilatation. There is no liver mass enhancement. Pancreas: No abnormality. Spleen: No abnormality. Adrenals/Urinary Tract: Interval  left nephroadrenalectomy. There are scattered retroperitoneal air pockets in the operative bed. There is mild low-density fluid settling posteriorly in the left renal fossa, with low-density fluid measuring 7.4 x 2.8 x 12.4 cm. This is probably a postoperative seroma or residual postoperative fluid. There is no right adrenal or renal mass enhancement. There is a 5 mm nonobstructive stone in the posterior right kidney. No obstructing stone is evident. There is mild bladder thickening versus underdistention. Correlate with urinalysis. Stomach/Bowel: Chronic gastric fold thickening. Likely chronic gastritis. The appendix and bowel are unremarkable. Vascular/Lymphatic: There is a small volume of thrombus in the IVC extending into it from the ligated left renal vein. On the MRI 11/15/2022 there was enhancing tumor thrombus in the left renal vein but not in the IVC. This could be a small tumor thrombus. Also not entirely certain some of the bilateral pulmonary arterial emboli are not tumor thrombus as well. No other significant vascular findings. There previously was left periaortic chain adenopathy but this has been removed. Reproductive: Uterus and bilateral adnexa are unremarkable. Other: There is extensive pneumoperitoneum, but this is almost all anterior. Some of it tracks along the left dorsal retroperitoneal planes but there are no air pockets along side of the bowel. This is probably postoperative free air, with hollow viscous perforation not strictly excluded. There is additional moderately extensive left abdominal wall emphysema, tracking into the groin areas. Some of the intraperitoneal air tracks into the inguinal canals and left hemiscrotum. No abscess is seen. A small amount of free fluid of low-density collects in the posterior right deep pelvis. Additional scattered fluid extends along the left retroperitoneum anterior to the aorta. Musculoskeletal: No aggressive regional osseous lesions. Review of the  MIP  images confirms the above findings. IMPRESSION: 1. Bilateral acute arterial emboli with overall moderate clot burden but no findings of acute right heart strain. Possible at least some of the embolic disease could be tumor thrombus. 2. Mild cardiomegaly with trace calcification in the LAD coronary artery. 3. Small left pleural effusion. 4. Bilateral lower lobe consolidations, left greater than right, could be due to pneumonia, infarctions or combination. 5. Extensive pneumomediastinum, and extensive left abdominal wall emphysema tracking into the inguinal canals and left hemiscrotum. 6. Small volume of thrombus in the IVC extending into it from the ligated left renal vein. On the MRI 11/15/2022 there was enhancing tumor thrombus in the left renal vein but not in the IVC. This could be a small tumor thrombus. 7. Interval left nephroadrenalectomy with low-density fluid settling posteriorly in the left renal fossa measuring 7.4 x 2.8 x 12.4 cm. This is probably a postoperative seroma or residual postoperative fluid. 8. Extensive pneumoperitoneum, almost all of which is anterior. This is probably postoperative free air, with hollow viscus perforation not strictly excluded. 9. Small amount of low-density free fluid in the posterior right deep pelvis. 10. Cholelithiasis. 11. Nonobstructive nephrolithiasis. 12. Aortic atherosclerosis.  Remaining findings described above. 13. Critical Value/emergent results were called by telephone at the time of interpretation on 12/19/2022 at 2:06 am to provider [facility] , who verbally acknowledged these results. Aortic Atherosclerosis (ICD10-I70.0). Electronically Signed   By: Almira Bar M.D.   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 Bronchial biopsy procedure also was done on 12/09/2022. Pulmonary embolism suspected, high  probability. Abdominal pain also. EXAM: CT ANGIOGRAPHY CHEST CT ABDOMEN AND PELVIS WITH CONTRAST TECHNIQUE: Multidetector CT imaging of the chest was performed using the standard protocol during bolus administration of intravenous contrast. Multiplanar CT image reconstructions and MIPs were obtained to evaluate the vascular anatomy. Multidetector CT imaging of the abdomen and pelvis was performed using the standard protocol during bolus administration of intravenous contrast. RADIATION DOSE REDUCTION: This exam was performed according to the departmental dose-optimization program which includes automated exposure control, adjustment of the mA and/or kV according to patient size and/or use of iterative reconstruction technique. CONTRAST:  OMNIPAQUE IOHEXOL 350 MG/ML SOLN COMPARISON:  Portable chest today, portable chest 12/16/2022 and 12/09/2022, chest CT without contrast 12/07/2022, CTA chest 10/15/2022, MRI abdomen 11/15/2022 and CT abdomen and pelvis with contrast 10/28/2022. FINDINGS: CTA CHEST FINDINGS Cardiovascular: The pulmonary trunk is upper limit of normal in caliber at 2.8 cm, was previously 2.3 cm. There are bilateral acute arterial emboli, overall moderate clot burden. There is no increase in the RV/LV ratio and no significant IVC reflux pattern. No acute right heart strain is suspected. On the left, several subsegmental emboli are noted in the lingula and apical segments. There is thrombus in the distal left lower lobe main artery extending into all of the segmental arteries with nonocclusive thrombus, and in a few scattered downstream subsegmental arteries where there is probably occlusive thrombus in the some of the basal subsegments. On the right, there are scattered subsegmental arterial partially occlusive emboli in the anterior and apical segments. There are a few subsegmental emboli in the lateral and medial right middle lobe segments some of which appear occlusive. There are additional  scattered subsegmental emboli in the basal segments of the lower lobe and in the superior segment. There is no pericardial  effusion. There is mild cardiomegaly with a left chamber predominance, interval increased. There is trace calcification in the LAD coronary artery. Thoracic aorta is tortuous, minimal calcific plaque in the distal arch is seen without aneurysm, stenosis or dissection. The great vessels are clear.  Pulmonary veins are nondistended. Mediastinum/Nodes: There is extensive pneumomediastinum which is primarily anterior, some of this extending into the right paratracheal and left-to-right lateral prevascular spaces. This probably tracked up from the abdomen where there is free intraperitoneal air presumably from the recent surgery. Esophageal thickness is normal. None of the air is seen around the esophagus. The trachea and main bronchi are clear. Unremarkable thyroid gland. No axillary or intrathoracic adenopathy. Lungs/Pleura: Small layering left pleural effusion. No right pleural effusion. No pneumothorax. There is posterior atelectasis in both lungs. New finding of patchy dense consolidation in the left lower lobe basal segments, focal consolidation in the superior segment of the right lower lobe findings could be due to pneumonia, infarctions or combination. No interstitial edema is seen. Remaining lungs are generally clear. 8 mm left lower lobe nodule noted previously is obscured due to left lung consolidation. Musculoskeletal: No aggressive bone lesion is seen. Review of the MIP images confirms the above findings. CT ABDOMEN and PELVIS FINDINGS Hepatobiliary: There is a small stone in the distal gallbladder. No wall thickening or bile duct dilatation. There is no liver mass enhancement. Pancreas: No abnormality. Spleen: No abnormality. Adrenals/Urinary Tract: Interval left nephroadrenalectomy. There are scattered retroperitoneal air pockets in the operative bed. There is mild low-density fluid  settling posteriorly in the left renal fossa, with low-density fluid measuring 7.4 x 2.8 x 12.4 cm. This is probably a postoperative seroma or residual postoperative fluid. There is no right adrenal or renal mass enhancement. There is a 5 mm nonobstructive stone in the posterior right kidney. No obstructing stone is evident. There is mild bladder thickening versus underdistention. Correlate with urinalysis. Stomach/Bowel: Chronic gastric fold thickening. Likely chronic gastritis. The appendix and bowel are unremarkable. Vascular/Lymphatic: There is a small volume of thrombus in the IVC extending into it from the ligated left renal vein. On the MRI 11/15/2022 there was enhancing tumor thrombus in the left renal vein but not in the IVC. This could be a small tumor thrombus. Also not entirely certain some of the bilateral pulmonary arterial emboli are not tumor thrombus as well. No other significant vascular findings. There previously was left periaortic chain adenopathy but this has been removed. Reproductive: Uterus and bilateral adnexa are unremarkable. Other: There is extensive pneumoperitoneum, but this is almost all anterior. Some of it tracks along the left dorsal retroperitoneal planes but there are no air pockets along side of the bowel. This is probably postoperative free air, with hollow viscous perforation not strictly excluded. There is additional moderately extensive left abdominal wall emphysema, tracking into the groin areas. Some of the intraperitoneal air tracks into the inguinal canals and left hemiscrotum. No abscess is seen. A small amount of free fluid of low-density collects in the posterior right deep pelvis. Additional scattered fluid extends along the left retroperitoneum anterior to the aorta. Musculoskeletal: No aggressive regional osseous lesions. Review of the MIP images confirms the above findings. IMPRESSION: 1. Bilateral acute arterial emboli with overall moderate clot burden but no  findings of acute right heart strain. Possible at least some of the embolic disease could be tumor thrombus. 2. Mild cardiomegaly with trace calcification in the LAD coronary artery. 3. Small left pleural effusion. 4. Bilateral lower lobe consolidations,  left greater than right, could be due to pneumonia, infarctions or combination. 5. Extensive pneumomediastinum, and extensive left abdominal wall emphysema tracking into the inguinal canals and left hemiscrotum. 6. Small volume of thrombus in the IVC extending into it from the ligated left renal vein. On the MRI 11/15/2022 there was enhancing tumor thrombus in the left renal vein but not in the IVC. This could be a small tumor thrombus. 7. Interval left nephroadrenalectomy with low-density fluid settling posteriorly in the left renal fossa measuring 7.4 x 2.8 x 12.4 cm. This is probably a postoperative seroma or residual postoperative fluid. 8. Extensive pneumoperitoneum, almost all of which is anterior. This is probably postoperative free air, with hollow viscus perforation not strictly excluded. 9. Small amount of low-density free fluid in the posterior right deep pelvis. 10. Cholelithiasis. 11. Nonobstructive nephrolithiasis. 12. Aortic atherosclerosis.  Remaining findings described above. 13. Critical Value/emergent results were called by telephone at the time of interpretation on 12/19/2022 at 2:06 am to provider [facility] , who verbally acknowledged these results. Aortic Atherosclerosis (ICD10-I70.0). Electronically Signed   By: Almira Bar M.D.   On: 12/19/2022 02:50   DG Chest Portable 1 View  Result Date: 12/19/2022 CLINICAL DATA:  Shortness of breath EXAM: PORTABLE CHEST 1 VIEW COMPARISON:  12/16/2022 FINDINGS: Continued free air under the right hemi diets crash that continued free air under the hemidiaphragms, likely related to recent surgery. Low lung volumes with bibasilar atelectasis and vascular congestion. Heart and mediastinal contours  within normal limits. No visible effusions or pneumothorax. IMPRESSION: Low lung volumes, bibasilar atelectasis, vascular congestion. Pneumoperitoneum, likely related to recent laparoscopic surgery. Electronically Signed   By: Charlett Nose M.D.   On: 12/19/2022 00:32    Assessment/Plan: #Bilateral PE - Management per primary team. Okay for therapeutic anticoagulation from Urology standpoint   #Left renal mass with tumor thrombus - S/p robotic left nephrectomy with Dr. Berneice Heinrich 12/15/22 - Post-op course c/b bilateral PE, as above - Small volume IVC thrombus extending from ligated left renal vein - Post-operative seroma in nephrectomy bed - No acute interventions required at this time   #Pneumoperitoneum, subcutaneous emphysema on CT - Likely post-operative in the setting of recent major surgery - Low concern for intestinal injury on exam - Continue to monitor patient's abdominal exam. Alert Urology if worsening/uncontrolled abdominal pain or concern for peritonitis    LOS: 1 day   Carlus Pavlov 12/20/2022, 7:26 AM

## 2022-12-20 NOTE — Progress Notes (Signed)
Mobility Specialist - Progress Note  Pre-mobility: 103 bpm HR, 97% SpO2 During mobility: 120 bpm HR, 90% SpO2   12/20/22 1138  Mobility  Activity Ambulated independently in hallway  Level of Assistance Standby assist, set-up cues, supervision of patient - no hands on  Assistive Device None  Distance Ambulated (ft) 500 ft  Range of Motion/Exercises Active  Activity Response Tolerated well  Mobility Referral Yes  $Mobility charge 1 Mobility  Mobility Specialist Start Time (ACUTE ONLY) 1123  Mobility Specialist Stop Time (ACUTE ONLY) 1133  Mobility Specialist Time Calculation (min) (ACUTE ONLY) 10 min   Pt was found in bed and agreeable to ambulate. Had no complaints during session. At ~350 RN asked to remove pt from [facility]. Pt was put on RA and ambulated remaining of hallway. Pt SPO2 dropped 88% upon returning to room. At EOS pt ambulated to bathroom and RN was notified. Pt mother in room.  Billey Chang Mobility Specialist

## 2022-12-20 NOTE — Progress Notes (Signed)
PROGRESS NOTE    Paul Flowers  ZOX:[id number] DOB: April 02, 1971 DOA: 12/18/2022 PCP: Excell Seltzer, MD   Brief Narrative: Paul Flowers is a pleasant 52 y.o. 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   Patient reports that he was doing okay back at home on 12/17/2022 but the following morning was experiencing increased shortness of breath with pleuritic pain.  This continued to worsen over the course of the day to the point where he was dyspneic at rest and unable to speak more than a couple words at a time.  EMS was called at this point, he was noted to have a heart rate in the 150s, and was given 500 mL of NS prior to arrival in the ED.   ED Course: Upon arrival to the ED, patient is found to be febrile to 38.9 C and saturating low 90s on room air with elevated heart rate and stable blood pressure.  EKG demonstrates sinus tachycardia.  CT reveals acute bilateral pulmonary embolism without evidence for right heart strain, bilateral lower lobe consolidation worrisome for pneumonia and/or infarction, small IVC thrombus, extensive pneumomediastinum, and extensive pneumoperitoneum.   Blood cultures collected in the ED and the patient was given 1 L of LR, vancomycin, cefepime, and started on IV heparin.  Urology (Dr. Delanna Ahmadi) was consulted by the ED physician.  Assessment & Plan:   Principal Problem:   Multiple subsegmental pulmonary emboli without acute cor pulmonale (HCC) Active Problems:   OSA (obstructive sleep apnea)   Hypertension associated with diabetes (HCC)   Controlled type 2 diabetes mellitus without complication, without long-term current use of insulin (HCC)   Anemia   Renal mass   Pneumonia  1. Acute bilateral PE   - No evidence for heart strain on  CT, BP has been stable, and BNP and troponin are normal  Will start DOAC today consulted pharmacy currently on IV heparin Venous Doppler of bilateral lower extremity shows no DVT Still dependent on oxygen 2 L saturating 91% will need ambulatory oxygen saturation were done today   2. Pneumonia  - Bilateral lower lobe consolidations noted on CT in ED, could reflect infarctions but with the fever, plan to treat with Rocephin and azithromycin strep pneumo negative  3. 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 Alena Bills of oncology at [facility]    4. Type II DM  - A1c was 7.7% in April 2024  - Check CBGs and use low-intensity SSI for now    5. Hypertension  - Losartan     6. Hyperlipidemia  - Lipitor    7. OSA  - CPAP at bedtime   Estimated body mass index is 24.61 kg/m as calculated from the following:   Height as of this encounter: 5\' 10"  (1.778 m).   Weight as of this encounter: 77.8 kg.  DVT prophylaxis: Heparin Code Status: Full code Family Communication: Significant other was at bedside yesterday  disposition Plan:  Status is: Inpatient Remains inpatient appropriate because: New onset hypoxia secondary to acute bilateral PE recent left nephrectomy Consultants:  Urology  Procedures: None Antimicrobials: Rocephin azithromycin  Subjective: He was anxious tachycardic short of breath yesterday was started on Xanax and Lopressor with improvement he feels like he slept  better still on 2 L of oxygen  Objective: Vitals:   12/19/22 2247 12/20/22 0415 12/20/22 0416 12/20/22 0832  BP: 118/81 120/79    Pulse: (!) 110 (!) 103    Resp: 20 20    Temp: 98.9 F (37.2 C) 99 F (37.2 C)    TempSrc: Oral Oral    SpO2: 100% 100%  91%  Weight:   77.8 kg   Height:        Intake/Output Summary (Last 24 hours) at 12/20/2022 0952 Last data filed at 12/20/2022 0328 Gross per 24 hour  Intake 1416.21 ml  Output  --  Net 1416.21 ml   Filed Weights   12/19/22 0500 12/20/22 0416  Weight: 75.4 kg 77.8 kg    Examination:  General exam: Appears calm and comfortable  Respiratory system: Few scattered rhonchi to auscultation. Respiratory effort normal. Cardiovascular system: S1 & S2 heard, RRR. No JVD, murmurs, rubs, gallops or clicks. No pedal edema. Gastrointestinal system: Abdomen is nondistended, soft and nontender. No organomegaly or masses felt. Normal bowel sounds heard. Central nervous system: Alert and oriented. No focal neurological deficits. Extremities: No edema. Skin: No rashes, lesions or ulcers Psychiatry: Judgement and insight appear normal. Mood & affect appropriate.     Data Reviewed: I have personally reviewed following labs and imaging studies  CBC: Recent Labs  Lab 12/15/22 1727 12/16/22 0514 12/19/22 0002 12/20/22 0438  WBC  --   --  11.0* 10.0  NEUTROABS  --   --  7.8*  --   HGB 8.7* 8.3* 9.3* 8.3*  HCT 29.4* 27.7* 30.7* 26.7*  MCV  --   --  80.6 80.2  PLT  --   --  253 259   Basic Metabolic Panel: Recent Labs  Lab 12/15/22 1727 12/16/22 0514 12/19/22 0002 12/19/22 0533 12/20/22 0438  NA 139 135 138 137 138  K 3.8 3.9 3.4* 3.3* 3.2*  CL 103 100 102 104 103  CO2 27 24 26 24 26   GLUCOSE 157* 174* 147* 114* 158*  BUN 8 9 11 10 9   CREATININE 0.80 1.02 1.18 0.90 0.99  CALCIUM 8.6* 8.3* 8.5* 8.1* 8.4*  MG  --   --   --  1.8  --    GFR: Estimated Creatinine Clearance: 89.1 mL/min (by C-G formula based on SCr of 0.99 mg/dL). Liver Function Tests: Recent Labs  Lab 12/19/22 0002  AST 12*  ALT 13  ALKPHOS 79  BILITOT 0.8  PROT 7.9  ALBUMIN 2.6*   No results for input(s): "LIPASE", "AMYLASE" in the last 168 hours. No results for input(s): "AMMONIA" in the last 168 hours. Coagulation Profile: No results for input(s): "INR", "PROTIME" in the last 168 hours. Cardiac Enzymes: No results for input(s): "CKTOTAL", "CKMB", "CKMBINDEX", "TROPONINI" in the  last 168 hours. BNP (last 3 results) No results for input(s): "PROBNP" in the last 8760 hours. HbA1C: No results for input(s): "HGBA1C" in the last 72 hours. CBG: Recent Labs  Lab 12/19/22 0825 12/19/22 1207 12/19/22 1640 12/19/22 2112 12/20/22 0734  GLUCAP 90 157* 101* 156* 165*   Lipid Profile: No results for input(s): "CHOL", "HDL", "LDLCALC", "TRIG", "CHOLHDL", "LDLDIRECT" in the last 72 hours. Thyroid Function Tests: No results for input(s): "TSH", "T4TOTAL", "FREET4", "T3FREE", "THYROIDAB" in the last 72 hours. Anemia Panel: No results for input(s): "VITAMINB12", "FOLATE", "FERRITIN", "TIBC", "IRON", "RETICCTPCT" in the last 72 hours. Sepsis Labs: No results for input(s): "PROCALCITON", "LATICACIDVEN" in the last 168 hours.  Recent Results (from the past 240  hour(s))  Blood culture (routine x 2)     Status: None (Preliminary result)   Collection Time: 12/19/22 12:34 AM   Specimen: BLOOD  Result Value Ref Range Status   Specimen Description   Final    BLOOD LEFT ANTECUBITAL Performed at [facility], [address] [place], [place] [address]    Special Requests   Final    BOTTLES DRAWN AEROBIC AND ANAEROBIC Blood Culture adequate volume Performed at [facility], [address], [place], [place] [address]    Culture   Final    NO GROWTH 1 DAY Performed at [facility], [address] [place], [place] [address]    Report Status PENDING  Incomplete  Blood culture (routine x 2)     Status: None (Preliminary result)   Collection Time: 12/19/22  5:33 AM   Specimen: BLOOD LEFT ARM  Result Value Ref Range Status   Specimen Description   Final    BLOOD LEFT ARM Performed at [facility], [address], [place], [place] [address]    Special Requests   Final    BOTTLES DRAWN AEROBIC AND ANAEROBIC Blood Culture adequate volume Performed at [facility], [address], [place],  [place] [address]    Culture   Final    NO GROWTH < 24 HOURS Performed at [facility], [address] [place], [place] [address]    Report Status PENDING  Incomplete         Radiology Studies: VAS [place] LOWER EXTREMITY VENOUS (DVT)  Result Date: 12/20/2022  Lower Venous DVT Study Patient Name:  Paul Flowers  Date of Exam:   12/19/2022 Medical Rec #: [id number]       Accession #:    [id number] Date of Birth: 06-Jan-1971       Patient Gender: M Patient Age:   59 years Exam Location:  [facility] Procedure:      VAS [place] LOWER EXTREMITY VENOUS (DVT) Referring Phys: Marcial Pacas OPYD --------------------------------------------------------------------------------  Indications: Pulmonary embolism. 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 Comparison Study: No prior studies. Performing Technologist: Jean Rosenthal RDMS, RVT  Examination Guidelines: A complete evaluation includes B-mode imaging, spectral Doppler, color Doppler, and power Doppler as needed of all accessible portions of each vessel. Bilateral testing is considered an integral part of a complete examination. Limited examinations for reoccurring indications may be performed as noted. The reflux portion of the exam is performed with the patient in reverse Trendelenburg.  +---------+---------------+---------+-----------+----------+--------------+ RIGHT    CompressibilityPhasicitySpontaneityPropertiesThrombus Aging +---------+---------------+---------+-----------+----------+--------------+ CFV      Full           Yes                                          +---------+---------------+---------+-----------+----------+--------------+ SFJ      Full                                                        +---------+---------------+---------+-----------+----------+--------------+ FV Prox  Full                                                         +---------+---------------+---------+-----------+----------+--------------+ FV Mid   Full                                                        +---------+---------------+---------+-----------+----------+--------------+ FV DistalFull           Yes      Yes                                 +---------+---------------+---------+-----------+----------+--------------+ PFV      Full                                                        +---------+---------------+---------+-----------+----------+--------------+ POP      Full           Yes      Yes                                 +---------+---------------+---------+-----------+----------+--------------+ PTV      Full                                                        +---------+---------------+---------+-----------+----------+--------------+ PERO     Full                                                        +---------+---------------+---------+-----------+----------+--------------+   +---------+---------------+---------+-----------+----------+--------------+ LEFT     CompressibilityPhasicitySpontaneityPropertiesThrombus Aging +---------+---------------+---------+-----------+----------+--------------+ CFV      Full           Yes      Yes                                 +---------+---------------+---------+-----------+----------+--------------+ SFJ      Full                                                        +---------+---------------+---------+-----------+----------+--------------+ FV Prox  Full                                                        +---------+---------------+---------+-----------+----------+--------------+  FV Mid   Full                                                        +---------+---------------+---------+-----------+----------+--------------+ FV DistalFull           Yes      Yes                                  +---------+---------------+---------+-----------+----------+--------------+ PFV      Full                                                        +---------+---------------+---------+-----------+----------+--------------+ POP      Full           Yes      Yes                                 +---------+---------------+---------+-----------+----------+--------------+ PTV      Full                                                        +---------+---------------+---------+-----------+----------+--------------+ PERO     Full                                                        +---------+---------------+---------+-----------+----------+--------------+     Summary: RIGHT: - There is no evidence of deep vein thrombosis in the lower extremity.  - No cystic structure found in the popliteal fossa.  LEFT: - There is no evidence of deep vein thrombosis in the lower extremity.  - No cystic structure found in the popliteal fossa.  *See table(s) above for measurements and observations. Electronically signed by Sherald Hess MD on 12/20/2022 at 9:24:00 AM.    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 Bronchial biopsy procedure also was done on 12/09/2022. Pulmonary embolism suspected, high probability. Abdominal pain also. EXAM: CT ANGIOGRAPHY CHEST CT ABDOMEN AND PELVIS WITH CONTRAST TECHNIQUE: Multidetector CT imaging of the chest was performed using the standard protocol during bolus administration of intravenous contrast. Multiplanar CT image reconstructions and MIPs were obtained to evaluate the vascular anatomy. Multidetector CT imaging of the abdomen and pelvis was performed using the standard protocol during bolus administration of intravenous contrast. RADIATION DOSE REDUCTION: This exam was performed according to the departmental dose-optimization  program which includes automated exposure control, adjustment of the mA and/or kV according to patient size and/or use of iterative reconstruction technique. CONTRAST:  OMNIPAQUE IOHEXOL 350 MG/ML SOLN COMPARISON:  Portable chest today, portable chest 12/16/2022 and 12/09/2022, chest CT without contrast  12/07/2022, CTA chest 10/15/2022, MRI abdomen 11/15/2022 and CT abdomen and pelvis with contrast 10/28/2022. FINDINGS: CTA CHEST FINDINGS Cardiovascular: The pulmonary trunk is upper limit of normal in caliber at 2.8 cm, was previously 2.3 cm. There are bilateral acute arterial emboli, overall moderate clot burden. There is no increase in the RV/LV ratio and no significant IVC reflux pattern. No acute right heart strain is suspected. On the left, several subsegmental emboli are noted in the lingula and apical segments. There is thrombus in the distal left lower lobe main artery extending into all of the segmental arteries with nonocclusive thrombus, and in a few scattered downstream subsegmental arteries where there is probably occlusive thrombus in the some of the basal subsegments. On the right, there are scattered subsegmental arterial partially occlusive emboli in the anterior and apical segments. There are a few subsegmental emboli in the lateral and medial right middle lobe segments some of which appear occlusive. There are additional scattered subsegmental emboli in the basal segments of the lower lobe and in the superior segment. There is no pericardial effusion. There is mild cardiomegaly with a left chamber predominance, interval increased. There is trace calcification in the LAD coronary artery. Thoracic aorta is tortuous, minimal calcific plaque in the distal arch is seen without aneurysm, stenosis or dissection. The great vessels are clear.  Pulmonary veins are nondistended. Mediastinum/Nodes: There is extensive pneumomediastinum which is primarily anterior, some of this extending into the right  paratracheal and left-to-right lateral prevascular spaces. This probably tracked up from the abdomen where there is free intraperitoneal air presumably from the recent surgery. Esophageal thickness is normal. None of the air is seen around the esophagus. The trachea and main bronchi are clear. Unremarkable thyroid gland. No axillary or intrathoracic adenopathy. Lungs/Pleura: Small layering left pleural effusion. No right pleural effusion. No pneumothorax. There is posterior atelectasis in both lungs. New finding of patchy dense consolidation in the left lower lobe basal segments, focal consolidation in the superior segment of the right lower lobe findings could be due to pneumonia, infarctions or combination. No interstitial edema is seen. Remaining lungs are generally clear. 8 mm left lower lobe nodule noted previously is obscured due to left lung consolidation. Musculoskeletal: No aggressive bone lesion is seen. Review of the MIP images confirms the above findings. CT ABDOMEN and PELVIS FINDINGS Hepatobiliary: There is a small stone in the distal gallbladder. No wall thickening or bile duct dilatation. There is no liver mass enhancement. Pancreas: No abnormality. Spleen: No abnormality. Adrenals/Urinary Tract: Interval left nephroadrenalectomy. There are scattered retroperitoneal air pockets in the operative bed. There is mild low-density fluid settling posteriorly in the left renal fossa, with low-density fluid measuring 7.4 x 2.8 x 12.4 cm. This is probably a postoperative seroma or residual postoperative fluid. There is no right adrenal or renal mass enhancement. There is a 5 mm nonobstructive stone in the posterior right kidney. No obstructing stone is evident. There is mild bladder thickening versus underdistention. Correlate with urinalysis. Stomach/Bowel: Chronic gastric fold thickening. Likely chronic gastritis. The appendix and bowel are unremarkable. Vascular/Lymphatic: There is a small volume of  thrombus in the IVC extending into it from the ligated left renal vein. On the MRI 11/15/2022 there was enhancing tumor thrombus in the left renal vein but not in the IVC. This could be a small tumor thrombus. Also not entirely certain some of the bilateral pulmonary arterial emboli are not tumor thrombus as well. No other significant vascular findings. There previously was left periaortic  chain adenopathy but this has been removed. Reproductive: Uterus and bilateral adnexa are unremarkable. Other: There is extensive pneumoperitoneum, but this is almost all anterior. Some of it tracks along the left dorsal retroperitoneal planes but there are no air pockets along side of the bowel. This is probably postoperative free air, with hollow viscous perforation not strictly excluded. There is additional moderately extensive left abdominal wall emphysema, tracking into the groin areas. Some of the intraperitoneal air tracks into the inguinal canals and left hemiscrotum. No abscess is seen. A small amount of free fluid of low-density collects in the posterior right deep pelvis. Additional scattered fluid extends along the left retroperitoneum anterior to the aorta. Musculoskeletal: No aggressive regional osseous lesions. Review of the MIP images confirms the above findings. IMPRESSION: 1. Bilateral acute arterial emboli with overall moderate clot burden but no findings of acute right heart strain. Possible at least some of the embolic disease could be tumor thrombus. 2. Mild cardiomegaly with trace calcification in the LAD coronary artery. 3. Small left pleural effusion. 4. Bilateral lower lobe consolidations, left greater than right, could be due to pneumonia, infarctions or combination. 5. Extensive pneumomediastinum, and extensive left abdominal wall emphysema tracking into the inguinal canals and left hemiscrotum. 6. Small volume of thrombus in the IVC extending into it from the ligated left renal vein. On the MRI  11/15/2022 there was enhancing tumor thrombus in the left renal vein but not in the IVC. This could be a small tumor thrombus. 7. Interval left nephroadrenalectomy with low-density fluid settling posteriorly in the left renal fossa measuring 7.4 x 2.8 x 12.4 cm. This is probably a postoperative seroma or residual postoperative fluid. 8. Extensive pneumoperitoneum, almost all of which is anterior. This is probably postoperative free air, with hollow viscus perforation not strictly excluded. 9. Small amount of low-density free fluid in the posterior right deep pelvis. 10. Cholelithiasis. 11. Nonobstructive nephrolithiasis. 12. Aortic atherosclerosis.  Remaining findings described above. 13. Critical Value/emergent results were called by telephone at the time of interpretation on 12/19/2022 at 2:06 am to provider [facility] , who verbally acknowledged these results. Aortic Atherosclerosis (ICD10-I70.0). Electronically Signed   By: Almira Bar M.D.   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 Bronchial biopsy procedure also was done on 12/09/2022. Pulmonary embolism suspected, high probability. Abdominal pain also. EXAM: CT ANGIOGRAPHY CHEST CT ABDOMEN AND PELVIS WITH CONTRAST TECHNIQUE: Multidetector CT imaging of the chest was performed using the standard protocol during bolus administration of intravenous contrast. Multiplanar CT image reconstructions and MIPs were obtained to evaluate the vascular anatomy. Multidetector CT imaging of the abdomen and pelvis was performed using the standard protocol during bolus administration of intravenous contrast. RADIATION DOSE REDUCTION: This exam was performed according to the departmental dose-optimization program which includes automated exposure control, adjustment of the mA and/or kV according to patient size and/or use of  iterative reconstruction technique. CONTRAST:  OMNIPAQUE IOHEXOL 350 MG/ML SOLN COMPARISON:  Portable chest today, portable chest 12/16/2022 and 12/09/2022, chest CT without contrast 12/07/2022, CTA chest 10/15/2022, MRI abdomen 11/15/2022 and CT abdomen and pelvis with contrast 10/28/2022. FINDINGS: CTA CHEST FINDINGS Cardiovascular: The pulmonary trunk is upper limit of normal in caliber at 2.8 cm, was previously 2.3 cm. There are bilateral acute arterial emboli, overall moderate clot burden. There is no increase in the RV/LV ratio and no significant IVC  reflux pattern. No acute right heart strain is suspected. On the left, several subsegmental emboli are noted in the lingula and apical segments. There is thrombus in the distal left lower lobe main artery extending into all of the segmental arteries with nonocclusive thrombus, and in a few scattered downstream subsegmental arteries where there is probably occlusive thrombus in the some of the basal subsegments. On the right, there are scattered subsegmental arterial partially occlusive emboli in the anterior and apical segments. There are a few subsegmental emboli in the lateral and medial right middle lobe segments some of which appear occlusive. There are additional scattered subsegmental emboli in the basal segments of the lower lobe and in the superior segment. There is no pericardial effusion. There is mild cardiomegaly with a left chamber predominance, interval increased. There is trace calcification in the LAD coronary artery. Thoracic aorta is tortuous, minimal calcific plaque in the distal arch is seen without aneurysm, stenosis or dissection. The great vessels are clear.  Pulmonary veins are nondistended. Mediastinum/Nodes: There is extensive pneumomediastinum which is primarily anterior, some of this extending into the right paratracheal and left-to-right lateral prevascular spaces. This probably tracked up from the abdomen where there is free  intraperitoneal air presumably from the recent surgery. Esophageal thickness is normal. None of the air is seen around the esophagus. The trachea and main bronchi are clear. Unremarkable thyroid gland. No axillary or intrathoracic adenopathy. Lungs/Pleura: Small layering left pleural effusion. No right pleural effusion. No pneumothorax. There is posterior atelectasis in both lungs. New finding of patchy dense consolidation in the left lower lobe basal segments, focal consolidation in the superior segment of the right lower lobe findings could be due to pneumonia, infarctions or combination. No interstitial edema is seen. Remaining lungs are generally clear. 8 mm left lower lobe nodule noted previously is obscured due to left lung consolidation. Musculoskeletal: No aggressive bone lesion is seen. Review of the MIP images confirms the above findings. CT ABDOMEN and PELVIS FINDINGS Hepatobiliary: There is a small stone in the distal gallbladder. No wall thickening or bile duct dilatation. There is no liver mass enhancement. Pancreas: No abnormality. Spleen: No abnormality. Adrenals/Urinary Tract: Interval left nephroadrenalectomy. There are scattered retroperitoneal air pockets in the operative bed. There is mild low-density fluid settling posteriorly in the left renal fossa, with low-density fluid measuring 7.4 x 2.8 x 12.4 cm. This is probably a postoperative seroma or residual postoperative fluid. There is no right adrenal or renal mass enhancement. There is a 5 mm nonobstructive stone in the posterior right kidney. No obstructing stone is evident. There is mild bladder thickening versus underdistention. Correlate with urinalysis. Stomach/Bowel: Chronic gastric fold thickening. Likely chronic gastritis. The appendix and bowel are unremarkable. Vascular/Lymphatic: There is a small volume of thrombus in the IVC extending into it from the ligated left renal vein. 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 This could be a small tumor thrombus. Also not entirely certain some of the bilateral pulmonary arterial emboli are not tumor thrombus as well. No other significant vascular findings. There previously was left periaortic chain adenopathy but this has been removed. Reproductive: Uterus and bilateral adnexa are unremarkable. Other: There is extensive pneumoperitoneum, but this is almost all anterior. Some of it tracks along the left dorsal retroperitoneal planes but there are no air pockets along side of the bowel. This is probably postoperative free air, with hollow viscous perforation not strictly excluded.  There is additional moderately extensive left abdominal wall emphysema, tracking into the groin areas. Some of the intraperitoneal air tracks into the inguinal canals and left hemiscrotum. No abscess is seen. A small amount of free fluid of low-density collects in the posterior right deep pelvis. Additional scattered fluid extends along the left retroperitoneum anterior to the aorta. Musculoskeletal: No aggressive regional osseous lesions. Review of the MIP images confirms the above findings. IMPRESSION: 1. Bilateral acute arterial emboli with overall moderate clot burden but no findings of acute right heart strain. Possible at least some of the embolic disease could be tumor thrombus. 2. Mild cardiomegaly with trace calcification in the LAD coronary artery. 3. Small left pleural effusion. 4. Bilateral lower lobe consolidations, left greater than right, could be due to pneumonia, infarctions or combination. 5. Extensive pneumomediastinum, and extensive left abdominal wall emphysema tracking into the inguinal canals and left hemiscrotum. 6. Small volume of thrombus in the IVC extending into it from the ligated left renal vein. On the MRI 11/15/2022 there was enhancing tumor thrombus in the left renal vein but not in the IVC. This could be a small tumor thrombus. 7.  Interval left nephroadrenalectomy with low-density fluid settling posteriorly in the left renal fossa measuring 7.4 x 2.8 x 12.4 cm. This is probably a postoperative seroma or residual postoperative fluid. 8. Extensive pneumoperitoneum, almost all of which is anterior. This is probably postoperative free air, with hollow viscus perforation not strictly excluded. 9. Small amount of low-density free fluid in the posterior right deep pelvis. 10. Cholelithiasis. 11. Nonobstructive nephrolithiasis. 12. Aortic atherosclerosis.  Remaining findings described above. 13. Critical Value/emergent results were called by telephone at the time of interpretation on 12/19/2022 at 2:06 am to provider [facility] , who verbally acknowledged these results. Aortic Atherosclerosis (ICD10-I70.0). Electronically Signed   By: Almira Bar M.D.   On: 12/19/2022 02:50   DG Chest Portable 1 View  Result Date: 12/19/2022 CLINICAL DATA:  Shortness of breath EXAM: PORTABLE CHEST 1 VIEW COMPARISON:  12/16/2022 FINDINGS: Continued free air under the right hemi diets crash that continued free air under the hemidiaphragms, likely related to recent surgery. Low lung volumes with bibasilar atelectasis and vascular congestion. Heart and mediastinal contours within normal limits. No visible effusions or pneumothorax. IMPRESSION: Low lung volumes, bibasilar atelectasis, vascular congestion. Pneumoperitoneum, likely related to recent laparoscopic surgery. Electronically Signed   By: Charlett Nose M.D.   On: 12/19/2022 00:32        Scheduled Meds:  apixaban  10 mg Oral BID   Followed by   Melene Muller ON 12/27/2022] apixaban  5 mg Oral BID   atorvastatin  20 mg Oral Daily   azithromycin  500 mg Oral Daily   docusate sodium  100 mg Oral BID   insulin aspart  0-5 Units Subcutaneous QHS   insulin aspart  0-6 Units Subcutaneous TID WC   losartan  25 mg Oral Daily   metoprolol tartrate  25 mg Oral BID   montelukast  10 mg Oral QHS    pantoprazole  40 mg Oral Daily   sodium chloride flush  3 mL Intravenous Q12H   Continuous Infusions:  cefTRIAXone (ROCEPHIN)  IV 2 g (12/20/22 0414)   heparin Stopped (12/20/22 0933)     LOS: 1 day    Time spent: 38 min  Alwyn Ren, MD 12/20/2022, 9:52 AM

## 2022-12-20 NOTE — TOC Benefit Eligibility Note (Signed)
Transition of Care [facility]) Benefit Eligibility Note    Patient Details  Name: Paul Flowers MRN: [id number] Date of Birth: 05/03/1971   Medication/Dose: Eliquis  Covered?: Yes     Prescription Coverage Preferred Pharmacy: local  Spoke with Person/Company/Phone Number:: Michelle/ [employer] [phone]  Co-Pay: $60.00  Prior Approval: No  Deductible: Met       Caren Macadam Phone Number: 12/20/2022, 11:25 AM

## 2022-12-20 NOTE — Progress Notes (Signed)
Pt refused cpap tonight.  Pt encouraged to call should he change his mind.  RN aware.  12/20/22 2146  BiPAP/CPAP/SIPAP  Reason BIPAP/CPAP not in use Other(comment)

## 2022-12-20 NOTE — Plan of Care (Signed)
  Problem: Safety: Goal: Ability to remain free from injury will improve Outcome: Progressing   Problem: Pain Managment: Goal: General experience of comfort will improve Outcome: Progressing   Problem: Coping: Goal: Level of anxiety will decrease Outcome: Progressing   Problem: Clinical Measurements: Goal: Respiratory complications will improve Outcome: Progressing   
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
## 2022-12-20 NOTE — Discharge Instructions (Signed)
Information on my medicine - ELIQUIS (apixaban)  Why was Eliquis prescribed for you? Eliquis was prescribed to treat blood clots that may have been found in the veins of your legs (deep vein thrombosis) or in your lungs (pulmonary embolism) and to reduce the risk of them occurring again.  What do You need to know about Eliquis ? The starting dose is 10 mg (two 5 mg tablets) taken TWICE daily for the FIRST SEVEN (7) DAYS, then on (enter date)  12/27/22  the dose is reduced to ONE 5 mg tablet taken TWICE daily.  Eliquis may be taken with or without food.   Try to take the dose about the same time in the morning and in the evening. If you have difficulty swallowing the tablet whole please discuss with your pharmacist how to take the medication safely.  Take Eliquis exactly as prescribed and DO NOT stop taking Eliquis without talking to the doctor who prescribed the medication.  Stopping may increase your risk of developing a new blood clot.  Refill your prescription before you run out.  After discharge, you should have regular check-up appointments with your healthcare provider that is prescribing your Eliquis.    What do you do if you miss a dose? If a dose of ELIQUIS is not taken at the scheduled time, take it as soon as possible on the same day and twice-daily administration should be resumed. The dose should not be doubled to make up for a missed dose.  Important Safety Information A possible side effect of Eliquis is bleeding. You should call your healthcare provider right away if you experience any of the following: Bleeding from an injury or your nose that does not stop. Unusual colored urine (red or dark brown) or unusual colored stools (red or black). Unusual bruising for unknown reasons. A serious fall or if you hit your head (even if there is no bleeding).  Some medicines may interact with Eliquis and might increase your risk of bleeding or clotting while on Eliquis. To  help avoid this, consult your healthcare provider or pharmacist prior to using any new prescription or non-prescription medications, including herbals, vitamins, non-steroidal anti-inflammatory drugs (NSAIDs) and supplements.  This website has more information on Eliquis (apixaban): http://[email or web address].com/eliquis/home
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
## 2022-12-21 DIAGNOSIS — I2694 Multiple subsegmental pulmonary emboli without acute cor pulmonale: Secondary | ICD-10-CM | POA: Diagnosis not present

## 2022-12-21 LAB — BASIC METABOLIC PANEL
Anion gap: 9 (ref 5–15)
BUN: 9 mg/dL (ref 6–20)
CO2: 26 mmol/L (ref 22–32)
Calcium: 8.7 mg/dL — ABNORMAL LOW (ref 8.9–10.3)
Chloride: 102 mmol/L (ref 98–111)
Creatinine, Ser: 0.92 mg/dL (ref 0.61–1.24)
GFR, Estimated: >60 mL/min (ref >60)
Glucose, Bld: 119 mg/dL — ABNORMAL HIGH (ref 70–99)
Potassium: 3.5 mmol/L (ref 3.5–5.1)
Sodium: 137 mmol/L (ref 135–145)

## 2022-12-21 LAB — CBC
HCT: 27.9 % — ABNORMAL LOW (ref 39.0–52.0)
Hemoglobin: 8.5 g/dL — ABNORMAL LOW (ref 13.0–17.0)
MCH: 24.5 pg — ABNORMAL LOW (ref 26.0–34.0)
MCHC: 30.5 g/dL (ref 30.0–36.0)
MCV: 80.4 fL (ref 80.0–100.0)
Platelets: 279 10*3/uL (ref 150–400)
RBC: 3.47 MIL/uL — ABNORMAL LOW (ref 4.22–5.81)
RDW: 14.5 % (ref 11.5–15.5)
WBC: 8.5 10*3/uL (ref 4.0–10.5)
nRBC: 0 % (ref 0.0–0.2)

## 2022-12-21 LAB — LUPUS ANTICOAGULANT PANEL
DRVVT: 60.3 s — ABNORMAL HIGH (ref 0.0–47.0)
PTT Lupus Anticoagulant: 70.5 s — ABNORMAL HIGH (ref 0.0–43.5)

## 2022-12-21 LAB — GLUCOSE, CAPILLARY
Glucose-Capillary: 100 mg/dL — ABNORMAL HIGH (ref 70–99)
Glucose-Capillary: 142 mg/dL — ABNORMAL HIGH (ref 70–99)
Glucose-Capillary: 75 mg/dL (ref 70–99)
Glucose-Capillary: 118 mg/dL — ABNORMAL HIGH (ref 70–99)

## 2022-12-21 LAB — CARDIOLIPIN ANTIBODIES, IGG, IGM, IGA
Anticardiolipin IgA: <9 APL U/mL (ref 0–11)
Anticardiolipin IgG: <9 GPL U/mL (ref 0–14)
Anticardiolipin IgM: 9 MPL U/mL (ref 0–12)

## 2022-12-21 LAB — PROTEIN C ACTIVITY: Protein C Activity: 121 % (ref 73–180)

## 2022-12-21 LAB — PROTEIN S ACTIVITY: Protein S Activity: 79 % (ref 63–140)

## 2022-12-21 LAB — DRVVT CONFIRM: dRVVT Confirm: 1.1 ratio (ref 0.8–1.2)

## 2022-12-21 LAB — PTT-LA MIX: PTT-LA Mix: 61.3 s — ABNORMAL HIGH (ref 0.0–40.5)

## 2022-12-21 LAB — PROTEIN S, TOTAL: Protein S Ag, Total: 129 % (ref 60–150)

## 2022-12-21 LAB — HEXAGONAL PHASE PHOSPHOLIPID: Hexagonal Phase Phospholipid: 6 s (ref 0–11)

## 2022-12-21 LAB — DRVVT MIX: dRVVT Mix: 46.8 s — ABNORMAL HIGH (ref 0.0–40.4)

## 2022-12-21 NOTE — Progress Notes (Signed)
   12/21/22 2219  BiPAP/CPAP/SIPAP  Reason BIPAP/CPAP not in use Other(comment) (pt refused)   Pt refused cpap tonight, prefers to wear nasal cannula instead.  Pt encouraged to call should he change his mind.  RN aware.

## 2022-12-21 NOTE — Progress Notes (Signed)
PROGRESS NOTE    Paul Flowers  ZOX:[id number] DOB: 06-04-1971 DOA: 12/18/2022 PCP: Excell Seltzer, MD   Brief Narrative: Paul Flowers is a pleasant 52 y.o. 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 Patient reports that he was doing okay back at home on 12/17/2022 but the following morning was experiencing increased shortness of breath with pleuritic pain.  This continued to worsen over the course of the day to the point where he was dyspneic at rest and unable to speak more than a couple words at a time.  EMS was called at this point, he was noted to have a heart rate in the 150s, and was given 500 mL of NS prior to arrival in the ED. his saturation was low 90s on room air EKG demonstrates sinus tachycardia.  CT reveals acute bilateral pulmonary embolism without evidence for right heart strain, bilateral lower lobe consolidation worrisome for pneumonia and/or infarction, small IVC thrombus, extensive pneumomediastinum, and extensive pneumoperitoneum.  Blood cultures collected in the ED and the patient was given 1 L of LR, vancomycin, cefepime, and started on IV heparin.  Urology (Dr. Delanna Ahmadi) was consulted by the ED physician.  Assessment & Plan:   Principal Problem:   Multiple subsegmental pulmonary emboli without acute cor pulmonale (HCC) Active Problems:   OSA (obstructive sleep apnea)   Hypertension associated with diabetes (HCC)   Controlled type 2 diabetes mellitus without complication, without long-term current use of insulin (HCC)   Anemia   Renal mass   Pneumonia   #1 acute bilateral pulmonary embolism he was treated with IV heparin transition to [facility] 12/20/2022.  CT chest showed no right heart strain.  Doppler of the lower extremities no DVT.  Patient  is still dependent on oxygen at 2 L to maintain his saturation above 90%.   Echocardiogram  #2 pneumonia improving on Rocephin and azithromycin.  Strep pneumo negative.   Encourage incentive spirometry.    #3 left renal mass status post left radical nephrectomy 12/15/2022 pathology in process as of 12/21/2022 he follows up at [facility] with Dr. Michae Kava oncology.    #4 type 2 diabetes A1c 7.7 in April 2024  #5 hypertension-on losartan  #6 hyperlipidemia on Lipitor  #7 obstructive sleep apnea on CPAP    Estimated body mass index is 22.96 kg/m as calculated from the following:   Height as of this encounter: 5\' 10"  (1.778 m).   Weight as of this encounter: 72.6 kg.  DVT prophylaxis: Eliquis  code Status: Full code Family Communication: Significant other was at bedside yesterday  disposition Plan:  Status is: Inpatient Remains inpatient appropriate because: New onset hypoxia secondary to acute bilateral PE recent left nephrectomy Consultants:  Urology  Procedures: None Antimicrobials: Rocephin azithromycin  Subjective:  Complains of pleuritic chest pain dyspnea on exertion shortness of breath very anxious Feels like he needs couple more days to feel safe to go home  Objective: Vitals:   12/21/22 0539 12/21/22 0543 12/21/22 0930 12/21/22 1100  BP:  (!) 141/92 (!) 141/92 120/83  Pulse:  (!) 106 (!) 110 95  Resp:  20 20 15   Temp:  99.7 F (37.6 C) 98.1 F (36.7 C) 99.2 F (37.3 C)  TempSrc:  Oral Oral Oral  SpO2:  100% 100%   Weight: 72.6 kg  Height:        Intake/Output Summary (Last 24 hours) at 12/21/2022 1206 Last data filed at 12/21/2022 1023 Gross per 24 hour  Intake 1870.33 ml  Output 801 ml  Net 1069.33 ml    Filed Weights   12/19/22 0500 12/20/22 0416 12/21/22 0539  Weight: 75.4 kg 77.8 kg 72.6 kg    Examination:  General exam: Appears in no acute distress Respiratory system: Few scattered rhonchi to auscultation. Respiratory effort  normal. Cardiovascular system: S1 & S2 heard, RRR. No JVD, murmurs, rubs, gallops or clicks. No pedal edema. Gastrointestinal system: Abdomen is nondistended, soft and appropriately tender surgical site.  No organomegaly or masses felt. Normal bowel sounds heard. Central nervous system: Alert and oriented. No focal neurological deficits. Extremities: No edema. Skin: No rashes, lesions or ulcers Psychiatry: Judgement and insight appear normal. Mood & affect appropriate.     Data Reviewed: I have personally reviewed following labs and imaging studies  CBC: Recent Labs  Lab 12/15/22 1727 12/16/22 0514 12/19/22 0002 12/20/22 0438 12/21/22 0500  WBC  --   --  11.0* 10.0 8.5  NEUTROABS  --   --  7.8*  --   --   HGB 8.7* 8.3* 9.3* 8.3* 8.5*  HCT 29.4* 27.7* 30.7* 26.7* 27.9*  MCV  --   --  80.6 80.2 80.4  PLT  --   --  253 259 279    Basic Metabolic Panel: Recent Labs  Lab 12/16/22 0514 12/19/22 0002 12/19/22 0533 12/20/22 0438 12/21/22 0500  NA 135 138 137 138 137  K 3.9 3.4* 3.3* 3.2* 3.5  CL 100 102 104 103 102  CO2 24 26 24 26 26   GLUCOSE 174* 147* 114* 158* 119*  BUN 9 11 10 9 9   CREATININE 1.02 1.18 0.90 0.99 0.92  CALCIUM 8.3* 8.5* 8.1* 8.4* 8.7*  MG  --   --  1.8  --   --     GFR: Estimated Creatinine Clearance: 95.4 mL/min (by C-G formula based on SCr of 0.92 mg/dL). Liver Function Tests: Recent Labs  Lab 12/19/22 0002  AST 12*  ALT 13  ALKPHOS 79  BILITOT 0.8  PROT 7.9  ALBUMIN 2.6*    No results for input(s): "LIPASE", "AMYLASE" in the last 168 hours. No results for input(s): "AMMONIA" in the last 168 hours. Coagulation Profile: No results for input(s): "INR", "PROTIME" in the last 168 hours. Cardiac Enzymes: No results for input(s): "CKTOTAL", "CKMB", "CKMBINDEX", "TROPONINI" in the last 168 hours. BNP (last 3 results) No results for input(s): "PROBNP" in the last 8760 hours. HbA1C: No results for input(s): "HGBA1C" in the last 72  hours. CBG: Recent Labs  Lab 12/20/22 1214 12/20/22 1647 12/20/22 2144 12/21/22 0721 12/21/22 1137  GLUCAP 108* 82 142* 100* 142*    Lipid Profile: No results for input(s): "CHOL", "HDL", "LDLCALC", "TRIG", "CHOLHDL", "LDLDIRECT" in the last 72 hours. Thyroid Function Tests: No results for input(s): "TSH", "T4TOTAL", "FREET4", "T3FREE", "THYROIDAB" in the last 72 hours. Anemia Panel: No results for input(s): "VITAMINB12", "FOLATE", "FERRITIN", "TIBC", "IRON", "RETICCTPCT" in the last 72 hours. Sepsis Labs: No results for input(s): "PROCALCITON", "LATICACIDVEN" in the last 168 hours.  Recent Results (from the past 240 hour(s))  Blood culture (routine x 2)     Status: None (Preliminary result)   Collection Time: 12/19/22 12:34 AM   Specimen: BLOOD  Result Value Ref Range Status   Specimen Description   Final    BLOOD LEFT ANTECUBITAL Performed at [facility], [address] [place], [place] [address]    Special Requests   Final    BOTTLES DRAWN AEROBIC AND ANAEROBIC Blood Culture adequate volume Performed at [facility], [address], [place], [place] [address]    Culture   Final    NO GROWTH 2 DAYS Performed at [facility], [address] [place], [place] [address]    Report Status PENDING  Incomplete  Blood culture (routine x 2)     Status: None (Preliminary result)   Collection Time: 12/19/22  5:33 AM   Specimen: BLOOD LEFT ARM  Result Value Ref Range Status   Specimen Description   Final    BLOOD LEFT ARM Performed at [facility], [address] [place], [place] [address]    Special Requests   Final    BOTTLES DRAWN AEROBIC AND ANAEROBIC Blood Culture adequate volume Performed at [facility], [address], [place], [place] [address]    Culture   Final    NO GROWTH 2 DAYS Performed at [facility], [address] [place], [place] [address]    Report Status  PENDING  Incomplete         Radiology Studies: No results found.      Scheduled Meds:  apixaban  10 mg Oral BID   Followed by   Melene Muller ON 12/27/2022] apixaban  5 mg Oral BID   atorvastatin  20 mg Oral Daily   azithromycin  500 mg Oral Daily   docusate sodium  100 mg Oral BID   insulin aspart  0-5 Units Subcutaneous QHS   insulin aspart  0-6 Units Subcutaneous TID WC   losartan  25 mg Oral Daily   metoprolol tartrate  25 mg Oral BID   montelukast  10 mg Oral QHS   pantoprazole  40 mg Oral Daily   sodium chloride flush  3 mL Intravenous Q12H   Continuous Infusions:  cefTRIAXone (ROCEPHIN)  IV 2 g (12/21/22 0230)     LOS: 2 days    Time spent: 38 min  Alwyn Ren, MD 12/21/2022, 12:06 PM

## 2022-12-21 NOTE — Progress Notes (Signed)
Mobility Specialist - Progress Note  Pre-mobility: 111 bpm HR, 93% SpO2 During mobility: 122 bpm HR, 91% SpO2   12/21/22 1531  Mobility  Activity Ambulated independently in hallway  Level of Assistance Standby assist, set-up cues, supervision of patient - no hands on  Assistive Device None  Distance Ambulated (ft) 400 ft  Range of Motion/Exercises Active  Activity Response Tolerated well  Mobility Referral Yes  $Mobility charge 1 Mobility  Mobility Specialist Start Time (ACUTE ONLY) 1515  Mobility Specialist Stop Time (ACUTE ONLY) 1530  Mobility Specialist Time Calculation (min) (ACUTE ONLY) 15 min   Pt was found in bed and agreeable to ambulate. Had no complaints during session and at EOS returned to bathroom. Mother in room and RN notified.  Billey Chang Mobility Specialist

## 2022-12-22 ENCOUNTER — Inpatient Hospital Stay (HOSPITAL_COMMUNITY): Payer: Federal, State, Local not specified - PPO

## 2022-12-22 ENCOUNTER — Encounter (HOSPITAL_COMMUNITY): Payer: Self-pay | Admitting: Family Medicine

## 2022-12-22 ENCOUNTER — Other Ambulatory Visit (HOSPITAL_COMMUNITY): Payer: Self-pay

## 2022-12-22 DIAGNOSIS — I2602 Saddle embolus of pulmonary artery with acute cor pulmonale: Secondary | ICD-10-CM | POA: Diagnosis not present

## 2022-12-22 DIAGNOSIS — R112 Nausea with vomiting, unspecified: Secondary | ICD-10-CM

## 2022-12-22 DIAGNOSIS — I2694 Multiple subsegmental pulmonary emboli without acute cor pulmonale: Secondary | ICD-10-CM | POA: Diagnosis not present

## 2022-12-22 HISTORY — DX: Nausea with vomiting, unspecified: R11.2

## 2022-12-22 LAB — BASIC METABOLIC PANEL
Anion gap: 14 (ref 5–15)
BUN: 11 mg/dL (ref 6–20)
CO2: 23 mmol/L (ref 22–32)
Calcium: 9.1 mg/dL (ref 8.9–10.3)
Chloride: 100 mmol/L (ref 98–111)
Creatinine, Ser: 0.92 mg/dL (ref 0.61–1.24)
GFR, Estimated: >60 mL/min (ref >60)
Glucose, Bld: 103 mg/dL — ABNORMAL HIGH (ref 70–99)
Potassium: 3.7 mmol/L (ref 3.5–5.1)
Sodium: 137 mmol/L (ref 135–145)

## 2022-12-22 LAB — GLUCOSE, CAPILLARY
Glucose-Capillary: 93 mg/dL (ref 70–99)
Glucose-Capillary: 94 mg/dL (ref 70–99)
Glucose-Capillary: 88 mg/dL (ref 70–99)
Glucose-Capillary: 102 mg/dL — ABNORMAL HIGH (ref 70–99)

## 2022-12-22 LAB — CBC WITH DIFFERENTIAL/PLATELET
Abs Immature Granulocytes: 0.07 10*3/uL (ref 0.00–0.07)
Basophils Absolute: 0 10*3/uL (ref 0.0–0.1)
Basophils Relative: 0 %
Eosinophils Absolute: 0.3 10*3/uL (ref 0.0–0.5)
Eosinophils Relative: 3 %
HCT: 33.4 % — ABNORMAL LOW (ref 39.0–52.0)
Hemoglobin: 10 g/dL — ABNORMAL LOW (ref 13.0–17.0)
Immature Granulocytes: 1 %
Lymphocytes Relative: 10 %
Lymphs Abs: 1.1 10*3/uL (ref 0.7–4.0)
MCH: 24.2 pg — ABNORMAL LOW (ref 26.0–34.0)
MCHC: 29.9 g/dL — ABNORMAL LOW (ref 30.0–36.0)
MCV: 80.7 fL (ref 80.0–100.0)
Monocytes Absolute: 1.1 10*3/uL — ABNORMAL HIGH (ref 0.1–1.0)
Monocytes Relative: 10 %
Neutro Abs: 8.4 10*3/uL — ABNORMAL HIGH (ref 1.7–7.7)
Neutrophils Relative %: 76 %
Platelets: 360 10*3/uL (ref 150–400)
RBC: 4.14 MIL/uL — ABNORMAL LOW (ref 4.22–5.81)
RDW: 14.4 % (ref 11.5–15.5)
WBC: 11.1 10*3/uL — ABNORMAL HIGH (ref 4.0–10.5)
nRBC: 0 % (ref 0.0–0.2)

## 2022-12-22 LAB — ECHOCARDIOGRAM COMPLETE
Area-P 1/2: 4.82 cm2
Calc EF: 43.8 %
Height: 70 in
S' Lateral: 3.3 cm
Single Plane A2C EF: 42.8 %
Single Plane A4C EF: 43.1 %
Weight: 2656.1 oz

## 2022-12-22 LAB — CULTURE, BLOOD (ROUTINE X 2): Culture: NO GROWTH

## 2022-12-22 LAB — TROPONIN I (HIGH SENSITIVITY): Troponin I (High Sensitivity): 3 ng/L (ref <18)

## 2022-12-22 LAB — MAGNESIUM: Magnesium: 1.9 mg/dL (ref 1.7–2.4)

## 2022-12-22 LAB — SURGICAL PATHOLOGY

## 2022-12-22 MED ORDER — SODIUM CHLORIDE 0.9 % IV SOLN
25.0000 mg | Freq: Four times a day (QID) | INTRAVENOUS | Status: DC | PRN
  Administered 2022-12-22 (×2): 25 mg via INTRAVENOUS
  Filled 2022-12-22: qty 25
  Filled 2022-12-22 (×2): qty 1
  Filled 2022-12-22: qty 25

## 2022-12-22 MED ORDER — DOXYCYCLINE HYCLATE 100 MG PO CAPS
100.0000 mg | ORAL_CAPSULE | Freq: Two times a day (BID) | ORAL | 0 refills | Status: DC
  Filled 2022-12-22: qty 4, 2d supply, fill #0

## 2022-12-22 MED ORDER — APIXABAN 5 MG PO TABS: 5.0000 mg | ORAL_TABLET | Freq: Two times a day (BID) | ORAL | 3 refills | Status: DC

## 2022-12-22 MED ORDER — PROCHLORPERAZINE EDISYLATE 10 MG/2ML IJ SOLN
10.0000 mg | Freq: Four times a day (QID) | INTRAMUSCULAR | Status: DC | PRN
  Administered 2022-12-22: 10 mg via INTRAVENOUS
  Filled 2022-12-22: qty 2

## 2022-12-22 MED ORDER — APIXABAN (ELIQUIS) VTE STARTER PACK (10MG AND 5MG)
ORAL_TABLET | ORAL | 0 refills | Status: DC
  Filled 2022-12-22: qty 74, 30d supply, fill #0

## 2022-12-22 MED ORDER — AMOXICILLIN 875 MG PO TABS
875.0000 mg | ORAL_TABLET | Freq: Two times a day (BID) | ORAL | 0 refills | Status: DC
  Filled 2022-12-22: qty 4, 2d supply, fill #0

## 2022-12-22 NOTE — Consult Note (Signed)
  [facility] Customer service manager [facility]) Accountable Care Organization (ACO) [facility] Liaison Note  12/22/2022  Paul Flowers [birth year] [id number]  Location: [facility] Liaison screened the patient remotely at [facility].  Insurance: [employer] Paul Flowers is a 52 y.o. male who is a Primary Care Patient of Excell Seltzer, MD. with [place] at [facility]. The patient was screened for 7 day readmission hospitalization with noted medium risk score for unplanned readmission risk with 2IP/1ED in 6 months.  The patient was assessed for potential [facility]) Care Management service needs for post hospital transition for care coordination. Review of patient's electronic medical record reveals patient is readmitted with an acute bilateral pulmonary embolisms and pneumonia. HX of Diabetes A1C 7.7. Patient with recent s/p left nephrectomy. Spoke with patient HIPAA verified via hospital bedside phone, explained reason for call on behalf of [facility] for post hospital follow up call support.  Patient was accepting and states he feel he is managing his diabetes his blood sugars were in the 90 range today.  Encouraged patient to the follow up call anticipated by the post hospital [facility] team to ensure community needs are met.  Patient verbalized understanding.  Plan: [facility] Liaison will continue to follow progress and disposition to asess for post hospital community care coordination/management needs.  Referral request for community care coordination: anticipate provider office TOC follow up.   [facility] Care Management/Population Health does not replace or interfere with any arrangements made by the Inpatient Transition of Care team.   For questions contact:   Charlesetta Shanks, RN BSN CCM [facility]  [phone] business mobile phone Toll free office [phone]  *Concierge Line  [phone] Fax number:  [phone] [email or web address]

## 2022-12-22 NOTE — Assessment & Plan Note (Addendum)
-   began acutely 6/19; no prior N/V in hospitalization - unclear etiology but imaging studies on admission were notable for pneumomediastinum/pneumoperitoneum/abdominal wall emphysema - no changes in meds prior to onset - negative cardiac workup for ACS as cause - CT abd/pelvis repeated 6/20; does not seem acutely worse to necessarily explain symptoms - given hypoactive bowel sounds may just be a mild ileus although he endorses flatus and BM at times - NGT placed 6/20 with ~1000 cc output since; continue until output decreases or patient not tolerating; then will attempt clamp trial and re-initiate diet once removed

## 2022-12-22 NOTE — Progress Notes (Signed)
Mobility Specialist - Progress Note  Pre-mobility: 105 bpm HR, 98% SpO2 During mobility: 120 bpm HR, 90% SpO2 Post-mobility: 118 bpm HR, 95% SPO2   12/22/22 0945  Mobility  Activity Ambulated independently in hallway  Level of Assistance Independent after set-up  Assistive Device None  Distance Ambulated (ft) 500 ft  Range of Motion/Exercises Active  Activity Response Tolerated well  Mobility Referral Yes  $Mobility charge 1 Mobility  Mobility Specialist Start Time (ACUTE ONLY) [id number]  Mobility Specialist Stop Time (ACUTE ONLY) 0945  Mobility Specialist Time Calculation (min) (ACUTE ONLY) 7 min   Pt was found in bed and agreeable to ambulate. Had no complaints during session and at EOS returned to bathroom. All needs met and RN notified.  Billey Chang Mobility Specialist

## 2022-12-22 NOTE — Assessment & Plan Note (Addendum)
-   Okay to resume home CPAP at discharge

## 2022-12-22 NOTE — Assessment & Plan Note (Addendum)
-   continue losartan and lopressor -Dosage of losartan increased to 50 mg daily (from 25 mg daily) -Lopressor is new medication and continued at discharge due to persistent elevated heart rate; as clot burden lessens, may not need medication in future.  Patient instructed to monitor his heart rate at home upon discharge

## 2022-12-22 NOTE — Assessment & Plan Note (Addendum)
Resume home regimen. 

## 2022-12-22 NOTE — Hospital Course (Addendum)
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 Upon discharging home he began developing pleuritic chest pains and shortness of breath.  He presented back to the ER with these complaints and underwent further workup. CT angio chest was obtained along with abdomen/pelvis which showed bilateral PE.  Also found to have pneumomediastinum, abdominal wall emphysema, and pneumoperitoneum which were considered post op findings from recent surgery.  He was initially started on heparin which was transitioned to Eliquis.  See below for further A&P.

## 2022-12-22 NOTE — Assessment & Plan Note (Signed)
-   s/p robotic assisted laparoscopic left radical nephrectomy with retroperitoneal lymph node dissection on 12/15/2022

## 2022-12-22 NOTE — Assessment & Plan Note (Addendum)
-   prior to surgery baseline ~12 g/dL; likely some ABLA with slow improvement  -Hemoglobin improved back to 10 g/dL prior to discharge

## 2022-12-22 NOTE — Progress Notes (Signed)
  Echocardiogram 2D Echocardiogram has been performed.  Janalyn Harder 12/22/2022, 2:14 PM

## 2022-12-22 NOTE — Assessment & Plan Note (Signed)
-   Due to bilateral lower lobe consolidations noted on CT angio chest, patient started on CAP coverage for 5 days; course completed during hospitalization

## 2022-12-22 NOTE — Progress Notes (Signed)
Progress Note    Paul Flowers   ZOX:[id number]  DOB: 1970-07-12  DOA: 12/18/2022     3 PCP: Excell Seltzer, MD  Initial CC: SOB, pleuritic pain  Hospital Course: 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 Upon discharging home he began developing pleuritic chest pains and shortness of breath.  He presented back to the ER with these complaints and underwent further workup. CT angio chest was obtained along with abdomen/pelvis which showed bilateral PE.  Also found to have pneumomediastinum, abdominal wall emphysema, and pneumoperitoneum which were considered post op findings from recent surgery.  He was initially started on heparin which was transitioned to Eliquis.  Interval History:  No events overnight.  Walking around room easily when seen this morning.  Has been weaned off of oxygen and no desaturation during ambulation when walking with mobility specialist this morning.  We had planned for discharge this morning but later in the afternoon he began developing severe nausea and vomiting.  Assessment and Plan: * Multiple subsegmental pulmonary emboli without acute cor pulmonale (HCC) - per CTA chest on 6/16: "Bilateral acute arterial emboli with overall moderate clot burden but no findings of acute right heart strain. Possible at least some of the embolic disease could be tumor thrombus." - s/p IV heparin to Eliquis - echo obtained: EF 40-45%, global hypokinesis, Grade 1 DD, normal RV function  Renal mass - s/p robotic assisted laparoscopic left radical nephrectomy with retroperitoneal lymph node dissection on 12/15/2022  Nausea and vomiting - began acutely 6/19; no prior N/V in hospitalization - unclear etiology but imaging studies on admission were notable for  pneumomediastinum/pneumoperitoneum/abdominal wall emphysema - no changes in meds prior to onset - also, echo is showing reduced EF compared to May 2024 echo; no reports of CP from patient today but with N/V will check a repeat EKG and troponin for thoroughness  Pneumonia - Due to bilateral lower lobe consolidations noted on CT angio chest, patient started on CAP coverage for 5 days  Normocytic anemia - prior to surgery baseline ~12 g/dL; likely some ABLA with slow improvement  - continue trending while in hospital but should be stabilizing   Controlled type 2 diabetes mellitus without complication, without long-term current use of insulin (HCC) - continue SSI and CBG monitoring   Hypertension associated with diabetes (HCC) - continue losartan and lopressor  OSA (obstructive sleep apnea) - hold CPAP for now    Old records reviewed in assessment of this patient  Antimicrobials: Cefepime 6/16 x 1 Azithromycin 12/19/2022 >> current Rocephin 12/19/2022 >> current  DVT prophylaxis:   apixaban (ELIQUIS) tablet 10 mg  apixaban (ELIQUIS) tablet 5 mg   Code Status:   Code Status: Full Code  Mobility Assessment (last 72 hours)     Mobility Assessment     Row Name 12/22/22 0705 12/21/22 2058 12/21/22 0930 12/20/22 0812 12/19/22 2030   Does patient have an order for bedrest or is patient medically unstable No - Continue assessment No - Continue assessment No - Continue assessment No - Continue assessment No - Continue assessment   What is the highest level of mobility based on the progressive mobility assessment? Level 5 (Walks with assist in room/hall) - Balance while stepping forward/back and can walk in room with assist - Complete Level 5 (Walks with assist in  room/hall) - Balance while stepping forward/back and can walk in room with assist - Complete Level 5 (Walks with assist in room/hall) - Balance while stepping forward/back and can walk in room with assist - Complete Level 5 (Walks  with assist in room/hall) - Balance while stepping forward/back and can walk in room with assist - Complete Level 5 (Walks with assist in room/hall) - Balance while stepping forward/back and can walk in room with assist - Complete            Barriers to discharge:  Disposition Plan:  Home Status is: Inpt  Objective: Blood pressure (!) 133/91, pulse (!) 106, temperature 99.3 F (37.4 C), temperature source Oral, resp. rate 20, height 5\' 10"  (1.778 m), weight 75.3 kg, SpO2 96 %.  Examination:  Physical Exam Constitutional:      General: He is not in acute distress.    Appearance: Normal appearance.  HENT:     Head: Normocephalic and atraumatic.     Mouth/Throat:     Mouth: Mucous membranes are moist.  Eyes:     Extraocular Movements: Extraocular movements intact.  Cardiovascular:     Rate and Rhythm: Normal rate and regular rhythm.  Pulmonary:     Effort: Pulmonary effort is normal. No respiratory distress.     Breath sounds: Normal breath sounds. No wheezing.  Abdominal:     General: There is no distension.     Palpations: Abdomen is soft.     Tenderness: There is no abdominal tenderness.     Comments: Midline surgical incision noted  Musculoskeletal:        General: Normal range of motion.     Cervical back: Normal range of motion and neck supple.  Skin:    General: Skin is warm and dry.  Neurological:     General: No focal deficit present.     Mental Status: He is alert.  Psychiatric:        Mood and Affect: Mood normal.        Behavior: Behavior normal.      Consultants:    Procedures:    Data Reviewed: Results for orders placed or performed during the hospital encounter of 12/18/22 (from the past 24 hour(s))  Glucose, capillary     Status: Abnormal   Collection Time: 12/21/22  8:37 PM  Result Value Ref Range   Glucose-Capillary 118 (H) 70 - 99 mg/dL  Glucose, capillary     Status: None   Collection Time: 12/22/22  7:23 AM  Result Value Ref Range    Glucose-Capillary 93 70 - 99 mg/dL  Glucose, capillary     Status: None   Collection Time: 12/22/22 11:36 AM  Result Value Ref Range   Glucose-Capillary 94 70 - 99 mg/dL  Glucose, capillary     Status: None   Collection Time: 12/22/22  4:05 PM  Result Value Ref Range   Glucose-Capillary 88 70 - 99 mg/dL    I have reviewed pertinent nursing notes, vitals, labs, and images as necessary. I have ordered labwork to follow up on as indicated.  I have reviewed the last notes from staff over past 24 hours. I have discussed patient's care plan and test results with nursing staff, CM/SW, and other staff as appropriate.  Time spent: Greater than 50% of the 55 minute visit was spent in counseling/coordination of care for the patient as laid out in the A&P.   LOS: 3 days   Lewie Chamber, MD [facility] 12/22/2022, 6:17 PM

## 2022-12-22 NOTE — Assessment & Plan Note (Signed)
-   per CTA chest on 6/16: "Bilateral acute arterial emboli with overall moderate clot burden but no findings of acute right heart strain. Possible at least some of the embolic disease could be tumor thrombus." - s/p IV heparin to Eliquis - echo obtained: EF 40-45%, global hypokinesis, Grade 1 DD, normal RV function

## 2022-12-23 ENCOUNTER — Other Ambulatory Visit (HOSPITAL_COMMUNITY): Payer: Self-pay

## 2022-12-23 ENCOUNTER — Encounter (HOSPITAL_COMMUNITY): Payer: Self-pay | Admitting: Family Medicine

## 2022-12-23 ENCOUNTER — Inpatient Hospital Stay (HOSPITAL_COMMUNITY): Payer: Federal, State, Local not specified - PPO

## 2022-12-23 DIAGNOSIS — I2694 Multiple subsegmental pulmonary emboli without acute cor pulmonale: Secondary | ICD-10-CM | POA: Diagnosis not present

## 2022-12-23 DIAGNOSIS — N2889 Other specified disorders of kidney and ureter: Secondary | ICD-10-CM | POA: Diagnosis not present

## 2022-12-23 DIAGNOSIS — R112 Nausea with vomiting, unspecified: Secondary | ICD-10-CM | POA: Diagnosis not present

## 2022-12-23 DIAGNOSIS — R931 Abnormal findings on diagnostic imaging of heart and coronary circulation: Secondary | ICD-10-CM

## 2022-12-23 LAB — GLUCOSE, CAPILLARY
Glucose-Capillary: 125 mg/dL — ABNORMAL HIGH (ref 70–99)
Glucose-Capillary: 109 mg/dL — ABNORMAL HIGH (ref 70–99)
Glucose-Capillary: 109 mg/dL — ABNORMAL HIGH (ref 70–99)
Glucose-Capillary: 113 mg/dL — ABNORMAL HIGH (ref 70–99)

## 2022-12-23 LAB — CULTURE, BLOOD (ROUTINE X 2)
Special Requests: ADEQUATE
Special Requests: ADEQUATE

## 2022-12-23 MED ORDER — LORAZEPAM 2 MG/ML IJ SOLN
2.0000 mg | INTRAMUSCULAR | Status: DC | PRN
  Administered 2022-12-23: 2 mg via INTRAVENOUS
  Filled 2022-12-23: qty 1

## 2022-12-23 MED ORDER — PANTOPRAZOLE SODIUM 40 MG IV SOLR
40.0000 mg | Freq: Every day | INTRAVENOUS | Status: DC
  Administered 2022-12-24 – 2022-12-25 (×2): 40 mg via INTRAVENOUS
  Filled 2022-12-23 (×2): qty 10

## 2022-12-23 MED ORDER — IOHEXOL 300 MG/ML  SOLN
100.0000 mL | Freq: Once | INTRAMUSCULAR | Status: AC | PRN
  Administered 2022-12-23: 100 mL via INTRAVENOUS

## 2022-12-23 MED ORDER — LABETALOL HCL 5 MG/ML IV SOLN
10.0000 mg | INTRAVENOUS | Status: DC | PRN
  Filled 2022-12-23: qty 4

## 2022-12-23 MED ORDER — IOHEXOL 9 MG/ML PO SOLN: 500.0000 mL | ORAL | Status: AC

## 2022-12-23 MED ORDER — HYDRALAZINE HCL 20 MG/ML IJ SOLN: 10.0000 mg | INTRAMUSCULAR | Status: DC | PRN

## 2022-12-23 NOTE — Progress Notes (Signed)
       CROSS COVER NOTE  NAME: Paul Flowers MRN: [id number] DOB : September 09, 1970    Concern as stated by nurse / staff    Paged to review KUB for NGT placement    Pertinent findings on chart review:   Assessment and  Interventions   Assessment: KUB done 1943 pm on /20/2024 - imaging and report reviewed by me. NGT in good position for use Plan: Order placed for NGT use X X      Donnie Mesa NP [facility] 7pm-7am - check amion for availability Pager [phone]

## 2022-12-23 NOTE — Plan of Care (Signed)
  Problem: Education: Goal: Knowledge of the prescribed therapeutic regimen will improve Outcome: Progressing   Problem: Bowel/Gastric: Goal: Gastrointestinal status for postoperative course will improve Outcome: Progressing   Problem: Respiratory: Goal: Ability to achieve and maintain a regular respiratory rate will improve Outcome: Progressing   Problem: Activity: Goal: Ability to tolerate increased activity will improve Outcome: Progressing   Problem: Coping: Goal: Ability to adjust to condition or change in health will improve Outcome: Progressing

## 2022-12-23 NOTE — Progress Notes (Signed)
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 Upon discharging home he began developing pleuritic chest pains and shortness of breath.  He presented back to the ER with these complaints and underwent further workup. CT angio chest was obtained along with abdomen/pelvis which showed bilateral PE.  Also found to have pneumomediastinum, abdominal wall emphysema, and pneumoperitoneum which were considered post op findings from recent surgery.  He was initially started on heparin which was transitioned to Eliquis.  Interval History:  Still having ongoing vomiting essentially bilious. Not able to tolerate much food due to this as well. No obvious etiology noted on imaging and he states he's having flatus and a BM this morning too.  Abdomen is mildly tender and hypoactive bowel sounds as well.   Assessment and Plan: * Multiple subsegmental pulmonary emboli without acute cor pulmonale (HCC) - per CTA chest on 6/16: "Bilateral acute arterial emboli with overall moderate clot burden but no findings of acute right heart strain. Possible at least some of the embolic disease could be tumor thrombus." - s/p IV heparin to Eliquis - echo obtained: EF 40-45%, global hypokinesis, Grade 1 DD, normal RV function  Renal mass - s/p robotic assisted laparoscopic left radical nephrectomy with retroperitoneal lymph node dissection on 12/15/2022  Abnormal echocardiogram - no CP nor prior heart disease; recent echo 11/30/22 obtained due to SOB/palpitations but showed EF 50-55% - now repeat echo shows EF 40-45% with global hypokinesis, Grade 1 DD - will discuss with  cardiology in morning   Nausea and vomiting - began acutely 6/19; no prior N/V in hospitalization - unclear etiology but imaging studies on admission were notable for pneumomediastinum/pneumoperitoneum/abdominal wall emphysema - no changes in meds prior to onset - negative cardiac workup for ACS as cause - CT abd/pelvis repeated 6/20; does not seem acutely worse to necessarily explain symptoms - given hypoactive bowel sounds may just be a mild ileus although he endorses flatus and BM this morning - may need to trial NGT for comfort and decompression if ongoing emesis  Pneumonia-resolved as of 12/23/2022 - Due to bilateral lower lobe consolidations noted on CT angio chest, patient started on CAP coverage for 5 days; course completed during hospitalization  Normocytic anemia - prior to surgery baseline ~12 g/dL; likely some ABLA with slow improvement  - continue trending while in hospital but should be stabilizing   Controlled type 2 diabetes mellitus without complication, without long-term current use of insulin (HCC) - continue SSI and CBG monitoring   Hypertension associated with diabetes (HCC) - continue losartan and lopressor  OSA (obstructive sleep apnea) - hold CPAP for now    Old records reviewed in assessment of this patient  Antimicrobials: Cefepime 6/16 x 1 Azithromycin 12/19/2022 >> 6/20 Rocephin 12/19/2022 >> 6/20  DVT prophylaxis:   apixaban (ELIQUIS) tablet 10 mg  apixaban (ELIQUIS) tablet 5 mg   Code Status:   Code Status: Full Code  Mobility Assessment (last 72 hours)     Mobility Assessment     Row Name 12/23/22 0831 12/22/22 2200 12/22/22 0705 12/21/22 2058 12/21/22 0930   Does patient have an order for  bedrest or is patient medically unstable No - Continue assessment No - Continue assessment No - Continue assessment No - Continue assessment No - Continue assessment   What is the highest level of mobility based on the progressive mobility assessment?  Level 5 (Walks with assist in room/hall) - Balance while stepping forward/back and can walk in room with assist - Complete Level 5 (Walks with assist in room/hall) - Balance while stepping forward/back and can walk in room with assist - Complete Level 5 (Walks with assist in room/hall) - Balance while stepping forward/back and can walk in room with assist - Complete Level 5 (Walks with assist in room/hall) - Balance while stepping forward/back and can walk in room with assist - Complete Level 5 (Walks with assist in room/hall) - Balance while stepping forward/back and can walk in room with assist - Complete            Barriers to discharge:  Disposition Plan:  Home Status is: Inpt  Objective: Blood pressure (!) 162/93, pulse 99, temperature 99.5 F (37.5 C), temperature source Oral, resp. rate 20, height 5\' 10"  (1.778 m), weight 70.7 kg, SpO2 98 %.  Examination:  Physical Exam Constitutional:      General: He is not in acute distress.    Appearance: Normal appearance.  HENT:     Head: Normocephalic and atraumatic.     Mouth/Throat:     Mouth: Mucous membranes are moist.  Eyes:     Extraocular Movements: Extraocular movements intact.  Cardiovascular:     Rate and Rhythm: Normal rate and regular rhythm.  Pulmonary:     Effort: Pulmonary effort is normal. No respiratory distress.     Breath sounds: Normal breath sounds. No wheezing.  Abdominal:     General: Bowel sounds are decreased. There is no distension.     Palpations: Abdomen is soft.     Tenderness: There is generalized abdominal tenderness (mild).     Comments: Midline surgical incision noted  Musculoskeletal:        General: Normal range of motion.     Cervical back: Normal range of motion and neck supple.  Skin:    General: Skin is warm and dry.  Neurological:     General: No focal deficit present.     Mental Status: He is alert.  Psychiatric:        Mood and Affect: Mood normal.        Behavior: Behavior normal.       Consultants:    Procedures:    Data Reviewed: Results for orders placed or performed during the hospital encounter of 12/18/22 (from the past 24 hour(s))  Glucose, capillary     Status: None   Collection Time: 12/22/22  4:05 PM  Result Value Ref Range   Glucose-Capillary 88 70 - 99 mg/dL  Troponin I (High Sensitivity)     Status: None   Collection Time: 12/22/22  6:39 PM  Result Value Ref Range   Troponin I (High Sensitivity) 3 <18 ng/L  Basic metabolic panel     Status: Abnormal   Collection Time: 12/22/22  6:39 PM  Result Value Ref Range   Sodium 137 135 - 145 mmol/L   Potassium 3.7 3.5 - 5.1 mmol/L   Chloride 100 98 - 111 mmol/L   CO2 23 22 - 32 mmol/L   Glucose, Bld 103 (H) 70 - 99 mg/dL   BUN 11 6 - 20 mg/dL   Creatinine, Ser 1.61 0.61 - 1.24 mg/dL  Calcium 9.1 8.9 - 10.3 mg/dL   GFR, Estimated >91 >47 mL/min   Anion gap 14 5 - 15  Magnesium     Status: None   Collection Time: 12/22/22  6:39 PM  Result Value Ref Range   Magnesium 1.9 1.7 - 2.4 mg/dL  CBC with Differential/Platelet     Status: Abnormal   Collection Time: 12/22/22  6:39 PM  Result Value Ref Range   WBC 11.1 (H) 4.0 - 10.5 K/uL   RBC 4.14 (L) 4.22 - 5.81 MIL/uL   Hemoglobin 10.0 (L) 13.0 - 17.0 g/dL   HCT 82.9 (L) 56.2 - 13.0 %   MCV 80.7 80.0 - 100.0 fL   MCH 24.2 (L) 26.0 - 34.0 pg   MCHC 29.9 (L) 30.0 - 36.0 g/dL   RDW 86.5 78.4 - 69.6 %   Platelets 360 150 - 400 K/uL   nRBC 0.0 0.0 - 0.2 %   Neutrophils Relative % 76 %   Neutro Abs 8.4 (H) 1.7 - 7.7 K/uL   Lymphocytes Relative 10 %   Lymphs Abs 1.1 0.7 - 4.0 K/uL   Monocytes Relative 10 %   Monocytes Absolute 1.1 (H) 0.1 - 1.0 K/uL   Eosinophils Relative 3 %   Eosinophils Absolute 0.3 0.0 - 0.5 K/uL   Basophils Relative 0 %   Basophils Absolute 0.0 0.0 - 0.1 K/uL   Immature Granulocytes 1 %   Abs Immature Granulocytes 0.07 0.00 - 0.07 K/uL  Glucose, capillary     Status: Abnormal   Collection Time: 12/22/22  8:44 PM  Result  Value Ref Range   Glucose-Capillary 102 (H) 70 - 99 mg/dL  Glucose, capillary     Status: Abnormal   Collection Time: 12/23/22  7:19 AM  Result Value Ref Range   Glucose-Capillary 125 (H) 70 - 99 mg/dL  Glucose, capillary     Status: Abnormal   Collection Time: 12/23/22 11:33 AM  Result Value Ref Range   Glucose-Capillary 109 (H) 70 - 99 mg/dL    I have reviewed pertinent nursing notes, vitals, labs, and images as necessary. I have ordered labwork to follow up on as indicated.  I have reviewed the last notes from staff over past 24 hours. I have discussed patient's care plan and test results with nursing staff, CM/SW, and other staff as appropriate.  Time spent: Greater than 50% of the 55 minute visit was spent in counseling/coordination of care for the patient as laid out in the A&P.   LOS: 4 days   Lewie Chamber, MD [facility] 12/23/2022, 3:40 PM

## 2022-12-23 NOTE — Assessment & Plan Note (Addendum)
-   no CP nor prior heart disease; recent echo 11/30/22 obtained due to SOB/palpitations but showed EF 50-55% - now repeat echo shows EF 40-45% with global hypokinesis, Grade 1 DD - possibly from PE? -Appreciate cardiology evaluation.  After echo review, there was not felt to be significant change in LV contractility; medications were adjusted prior to discharge (losartan increased and Lopressor continued due to persistent tachycardia) -Outpatient follow-up will be arranged with cardiology to consider any further LV assessment

## 2022-12-24 DIAGNOSIS — R112 Nausea with vomiting, unspecified: Secondary | ICD-10-CM | POA: Diagnosis not present

## 2022-12-24 DIAGNOSIS — N2889 Other specified disorders of kidney and ureter: Secondary | ICD-10-CM | POA: Diagnosis not present

## 2022-12-24 DIAGNOSIS — R931 Abnormal findings on diagnostic imaging of heart and coronary circulation: Secondary | ICD-10-CM | POA: Diagnosis not present

## 2022-12-24 DIAGNOSIS — I2694 Multiple subsegmental pulmonary emboli without acute cor pulmonale: Secondary | ICD-10-CM | POA: Diagnosis not present

## 2022-12-24 LAB — CULTURE, BLOOD (ROUTINE X 2): Culture: NO GROWTH

## 2022-12-24 LAB — GLUCOSE, CAPILLARY
Glucose-Capillary: 107 mg/dL — ABNORMAL HIGH (ref 70–99)
Glucose-Capillary: 97 mg/dL (ref 70–99)
Glucose-Capillary: 96 mg/dL (ref 70–99)
Glucose-Capillary: 82 mg/dL (ref 70–99)

## 2022-12-24 NOTE — Progress Notes (Signed)
Mobility Specialist - Progress Note   12/24/22 1147  Mobility  Activity Ambulated independently in hallway  Level of Assistance Independent after set-up  Assistive Device None  Distance Ambulated (ft) 500 ft  Range of Motion/Exercises Active  Activity Response Tolerated well  Mobility Referral Yes  $Mobility charge 1 Mobility  Mobility Specialist Start Time (ACUTE ONLY) 1137  Mobility Specialist Stop Time (ACUTE ONLY) 1147  Mobility Specialist Time Calculation (min) (ACUTE ONLY) 10 min   Pt was found in bed and agreeable to ambulate. Stated feeling a little "foggy" during ambulation. At EOS returned to use bathroom. All needs met. RN notified.  Billey Chang Mobility Specialist

## 2022-12-24 NOTE — Progress Notes (Signed)
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 Had large renal vein tumro thrombus as well.    2 - Pulmonary Emboli - new bilateral small segment PE's on ER CT 6/16 on eval SOB and pleuritic chest pain. Recent h/o advanced renal cancer.   3 - Nausea / Vomiting - new nausea with emesis during admission for PE's. CT 6/20 w/o large fluid collections / free air / bowel leak. Still having bowel function and flatus. NGT placed 6/20.   Today "Paul Flowers" is mixed. Still some significant nausea / bilious emesis but CT yeserday reassuring and passing gas, had BM yesterday. No more SOB.   Objective: Vital signs in last 24 hours: Temp:  [98.5 F (36.9 C)-99.2 F (37.3 C)] 98.5 F (36.9 C) (06/21 1241) Pulse Rate:  [96-109] 96 (06/21 1241) Resp:  [16-19] 16 (06/21 1241) BP: (132-152)/(92-98) 132/92 (06/21 1241) SpO2:  [95 %-98 %] 98 % (06/21 1241) Last BM Date : 12/23/22  Intake/Output from previous day: 06/20 0701 - 06/21 0700 In: 120 [P.O.:120] Out: 1701 [Urine:1; Emesis/NG output:1700] Intake/Output this shift: No intake/output data recorded.  Pleasant, family at bedside Non-labored breathing on RA NG in with thin bilious output SNTND. No tmpany / rebound whatsoever Recent port and extraction sites c/d/I NO c/c/e  Lab Results:  Recent Labs    12/22/22 1839  WBC 11.1*  HGB 10.0*  HCT 33.4*  PLT 360   BMET Recent Labs    12/22/22 1839  NA 137  K 3.7  CL 100  CO2 23  GLUCOSE 103*  BUN 11  CREATININE 0.92  CALCIUM 9.1   PT/INR No results for input(s): "LABPROT", "INR" in the last 72 hours. ABG No results for input(s): "PHART", "HCO3" in the last 72 hours.  Invalid input(s): "PCO2", "PO2"  Studies/Results: DG Abd 1 View  Result Date: 12/23/2022 CLINICAL DATA:  Nasogastric tube placement EXAM: ABDOMEN - 1  VIEW COMPARISON:  12/23/2022 CT scan FINDINGS: Nasogastric tube noted with tip in the left upper quadrant compatible with the junction of the gastric body and fundus. Continued pneumoperitoneum observed under the right hemidiaphragm and along the stomach. This is presumably postoperative given the recent operative history, and does not appear substantially changed from the CT scan from earlier today. Continued mild airspace opacity in the left lower lobe, not changed from earlier CT scan and mostly due to atelectasis. The patient also has a trace left pleural effusion. IMPRESSION: 1. Nasogastric tube tip in the left upper quadrant compatible with the junction of the gastric body and fundus. 2. Stable pneumoperitoneum (reportedly postoperative). 3. Continued mild airspace opacity in the left lower lobe, mostly due to atelectasis. 4. Trace left pleural effusion. Electronically Signed   By: Gaylyn Rong M.D.   On: 12/23/2022 19:56   CT ABDOMEN PELVIS W CONTRAST  Result Date: 12/23/2022 CLINICAL DATA:  Postoperative abdominal pain. EXAM: CT ABDOMEN AND PELVIS WITH CONTRAST TECHNIQUE: Multidetector CT imaging of the abdomen and pelvis was performed using the standard protocol following bolus administration of intravenous contrast. RADIATION DOSE REDUCTION: This exam was performed according to the departmental dose-optimization program which includes automated exposure control, adjustment of the mA and/or kV according to patient size and/or use of iterative reconstruction technique. CONTRAST:  80 mL OMNIPAQUE IOHEXOL 300 MG/ML  SOLN COMPARISON:  December 19, 2022.  October 28, 2022. FINDINGS: Lower chest: Mild bilateral posterior basilar subsegmental  atelectasis. Hepatobiliary: Small gallstone. No biliary dilatation. Liver is otherwise unremarkable. Pancreas: Unremarkable. No pancreatic ductal dilatation or surrounding inflammatory changes. Spleen: Normal in size without focal abnormality. Adrenals/Urinary Tract:  Status post left adreno-nephrectomy. Right adrenal gland is unremarkable. Small nonobstructive right renal calculus is noted. No hydronephrosis or renal obstruction is noted. Urinary bladder is unremarkable. Stable appearance of fluid in left nephrectomy bed most consistent with postoperative seroma. Stomach/Bowel: Stomach is within normal limits. Appendix appears normal. No evidence of bowel wall thickening, distention, or inflammatory changes. Vascular/Lymphatic: No significant adenopathy is noted. There remains a small amount of thrombus within the residual ligated left renal vein which extends into the IVC. Reproductive: Prostate is unremarkable. Other: Continued presence of pneumoperitoneum which is most likely related to postoperative status. Subcutaneous gas is noted anteriorly in the pelvis as well as in the left anterior abdominal wall also most consistent with recent laparoscopic surgery. Musculoskeletal: There is interval development of 2.2 cm lucency in the right femoral neck concerning for possible metastatic disease. IMPRESSION: Interval development of 2.2 cm rounded lucency in right femoral neck since October 28, 2022, concerning for possible metastatic disease. Further evaluation with MRI is recommended. Status post surgical resection of left adrenal gland and kidney. Stable appearance of fluid is seen in left nephrectomy bed most consistent with postoperative seroma. Stable small nonobstructive right renal calculus. Continued presence of pneumoperitoneum is noted as well as subcutaneous gas anteriorly in the pelvis and left anterior abdominal wall consistent with recent laparoscopic surgery. Stable amount of thrombus seen extending from residual ligated left renal vein into IVC. Mild bibasilar subsegmental atelectasis is noted. Electronically Signed   By: Lupita Raider M.D.   On: 12/23/2022 11:14   DG Abd Portable 1V  Result Date: 12/22/2022 CLINICAL DATA:  Nausea and vomiting. EXAM: PORTABLE  ABDOMEN - 1 VIEW COMPARISON:  Abdominal CT dated 12/19/2022. FINDINGS: No bowel dilatation or evidence of obstruction. The free air seen on the CT not evaluated on this radiograph. Cross-table lateral or decubitus views or alternatively AP view in the upright position may provide better evaluation of the pneumoperitoneum. No radiopaque calculi. The osseous structures are intact. The soft tissues are unremarkable. IMPRESSION: Nonobstructive bowel gas pattern. Electronically Signed   By: Elgie Collard M.D.   On: 12/22/2022 18:44   DG CHEST PORT 1 VIEW  Result Date: 12/22/2022 CLINICAL DATA:  Pneumoperitoneum EXAM: PORTABLE CHEST 1 VIEW COMPARISON:  12/19/2022, 12/16/2022 FINDINGS: Airspace disease at left base. Stable cardiomediastinal silhouette. No pneumothorax. Residual pneumoperitoneum appears decreased from 12/16/2022, probably decreased from 12/19/2022. IMPRESSION: 1. Persistent left basilar airspace disease. 2. Residual pneumoperitoneum, appears decreased since 12/16/2022, probably decreased from 12/19/2022 but continued radiographic follow-up recommended to ensure resolution Electronically Signed   By: Jasmine Pang M.D.   On: 12/22/2022 18:42   ECHOCARDIOGRAM COMPLETE  Result Date: 12/22/2022    ECHOCARDIOGRAM REPORT   Patient Name:   JEFFRE Flowers Date of Exam: 12/22/2022 Medical Rec #:  [id number]      Height:       70.0 in Accession #:    [id number]     Weight:       166.0 lb Date of Birth:  Jul 22, 1970      BSA:          1.928 m Patient Age:    51 years       BP:           138/94 mmHg Patient Gender: M  HR:           98 bpm. Exam Location:  Inpatient Procedure: 2D Echo, 3D Echo, Cardiac Doppler and Color Doppler Indications:    I26.02 Pulmonary embolus  History:        Patient has prior history of Echocardiogram examinations, most                 recent 11/30/2022. Abnormal ECG, Arrythmias:Tachycardia,                 Signs/Symptoms:Dyspnea; Risk Factors:Sleep Apnea, Diabetes,                  Hypertension and Dyslipidemia. Pulmonary embolus.  Sonographer:    Sheralyn Boatman RDCS Referring Phys: [phone] DAVID GIRGUIS  Sonographer Comments: Technically difficult study due to poor echo windows and suboptimal parasternal window. Patient began to vomit during subcostals. Exam ended due to patient vomiting. IMPRESSIONS  1. Left ventricular ejection fraction, by estimation, is 40 to 45%. The left ventricle has mildly decreased function. The left ventricle demonstrates global hypokinesis. Left ventricular diastolic parameters are consistent with Grade I diastolic dysfunction (impaired relaxation).  2. Right ventricular systolic function is normal. The right ventricular size is normal. Tricuspid regurgitation signal is inadequate for assessing PA pressure.  3. The mitral valve is normal in structure. Trivial mitral valve regurgitation. No evidence of mitral stenosis.  4. The aortic valve is normal in structure. Aortic valve regurgitation is trivial. No aortic stenosis is present. FINDINGS  Left Ventricle: Left ventricular ejection fraction, by estimation, is 40 to 45%. The left ventricle has mildly decreased function. The left ventricle demonstrates global hypokinesis. The left ventricular internal cavity size was normal in size. There is  no left ventricular hypertrophy. Left ventricular diastolic parameters are consistent with Grade I diastolic dysfunction (impaired relaxation). Normal left ventricular filling pressure. Right Ventricle: The right ventricular size is normal. No increase in right ventricular wall thickness. Right ventricular systolic function is normal. Tricuspid regurgitation signal is inadequate for assessing PA pressure. Left Atrium: Left atrial size was normal in size. Right Atrium: Right atrial size was normal in size. Pericardium: There is no evidence of pericardial effusion. Mitral Valve: The mitral valve is normal in structure. Trivial mitral valve regurgitation. No evidence of mitral valve  stenosis. Tricuspid Valve: The tricuspid valve is normal in structure. Tricuspid valve regurgitation is trivial. No evidence of tricuspid stenosis. Aortic Valve: The aortic valve is normal in structure. Aortic valve regurgitation is trivial. No aortic stenosis is present. Pulmonic Valve: The pulmonic valve was not well visualized. Pulmonic valve regurgitation is not visualized. No evidence of pulmonic stenosis. Aorta: The aortic root is normal in size and structure. Venous: The inferior vena cava was not well visualized. IAS/Shunts: No atrial level shunt detected by color flow Doppler.  LEFT VENTRICLE PLAX 2D LVIDd:         4.10 cm      Diastology LVIDs:         3.30 cm      LV e' medial:    9.25 cm/s LV PW:         1.00 cm      LV E/e' medial:  6.5 LV IVS:        1.00 cm      LV e' lateral:   10.90 cm/s LVOT diam:     2.20 cm      LV E/e' lateral: 5.5 LV SV:         58 LV SV  Index:   30 LVOT Area:     3.80 cm                              3D Volume EF: LV Volumes (MOD)            3D EF:        39 % LV vol d, MOD A2C: 96.9 ml  LV EDV:       137 ml LV vol d, MOD A4C: 104.0 ml LV ESV:       83 ml LV vol s, MOD A2C: 55.4 ml  LV SV:        54 ml LV vol s, MOD A4C: 59.2 ml LV SV MOD A2C:     41.5 ml LV SV MOD A4C:     104.0 ml LV SV MOD BP:      44.7 ml RIGHT VENTRICLE RV S prime:     6.74 cm/s TAPSE (M-mode): 1.1 cm LEFT ATRIUM           Index        RIGHT ATRIUM          Index LA diam:      3.40 cm 1.76 cm/m   RA Area:     7.97 cm LA Vol (A2C): 22.2 ml 11.51 ml/m  RA Volume:   11.00 ml 5.70 ml/m LA Vol (A4C): 23.3 ml 12.08 ml/m  AORTIC VALVE LVOT Vmax:   90.80 cm/s LVOT Vmean:  62.900 cm/s LVOT VTI:    0.152 m  AORTA Ao Root diam: 3.20 cm Ao Asc diam:  3.30 cm MITRAL VALVE MV Area (PHT): 4.82 cm    SHUNTS MV Decel Time: 157 msec    Systemic VTI:  0.15 m MV E velocity: 59.90 cm/s  Systemic Diam: 2.20 cm MV A velocity: 83.80 cm/s MV E/A ratio:  0.71 Armanda Magic MD Electronically signed by Armanda Magic MD Signature  Date/Time: 12/22/2022/2:37:07 PM    Final     Anti-infectives: Anti-infectives (From admission, onward)    Start     Dose/Rate Route Frequency Ordered Stop   12/23/22 0000  amoxicillin (AMOXIL) 875 MG tablet        875 mg Oral 2 times daily 12/22/22 1101 12/25/22 2359   12/23/22 0000  doxycycline (VIBRAMYCIN) 100 MG capsule        100 mg Oral 2 times daily 12/22/22 1101 12/25/22 2359   12/19/22 1000  azithromycin (ZITHROMAX) tablet 500 mg        500 mg Oral Daily 12/19/22 0306 12/23/22 0819   12/19/22 0315  cefTRIAXone (ROCEPHIN) 2 g in sodium chloride 0.9 % 100 mL IVPB        2 g 200 mL/hr over 30 Minutes Intravenous Every 24 hours 12/19/22 0306 12/23/22 0401   12/19/22 0130  vancomycin (VANCOCIN) IVPB 1000 mg/200 mL premix        1,000 mg 200 mL/hr over 60 Minutes Intravenous  Once 12/19/22 0117 12/19/22 0401   12/19/22 0130  ceFEPIme (MAXIPIME) 2 g in sodium chloride 0.9 % 100 mL IVPB        2 g 200 mL/hr over 30 Minutes Intravenous  Once 12/19/22 0117 12/19/22 0257       Assessment/Plan:  No further cancer directed care this admission, he will likely recive systemic adjuvant therapy in elective setting from medical oncology who is established with in [place] and has utmost respect.  I fell  A OK to anticoagulate as needed for PE treatment from Urol / post-op perspective. Greatly appreciate medical team comanagemnet.   No overt intra-abdominal surgiacl processes (SBO, leaks, abscesses) leading to new nausea / emesis. This is good news. Agree with short term NGT, conservative measures. Discussed with him importance of recordign PO intake volume.    Loletta Parish. 12/24/2022

## 2022-12-24 NOTE — Progress Notes (Signed)
Progress Note    Paul Flowers   WUJ:[id number]  DOB: Mar 28, 1971  DOA: 12/18/2022     5 PCP: Excell Seltzer, MD  Initial CC: SOB, pleuritic pain  Hospital Course: Paul Flowers is a 52 yo male with PMH DM II, GERD, HTN, sleep apnea, left renal mass with tumor thrombus s/p robotic assisted laparoscopic left radical nephrectomy with retroperitoneal lymph node dissection on 12/15/2022 with urology. Upon discharging home he began developing pleuritic chest pains and shortness of breath.  He presented back to the ER with these complaints and underwent further workup. CT angio chest was obtained along with abdomen/pelvis which showed bilateral PE.  Also found to have pneumomediastinum, abdominal wall emphysema, and pneumoperitoneum which were considered post op findings from recent surgery.  He was initially started on heparin which was transitioned to Eliquis.  Interval History:  NG tube placed yesterday evening and he is tolerating fairly well.  So far has been having substantial output, 1.7 L since placement.  No further vomiting since placement.  Encouraged him to continue ambulating.  Bowel sounds are still hypoactive this morning.  Assessment and Plan: * Multiple subsegmental pulmonary emboli without acute cor pulmonale (HCC) - per CTA chest on 6/16: "Bilateral acute arterial emboli with overall moderate clot burden but no findings of acute right heart strain. Possible at least some of the embolic disease could be tumor thrombus." - s/p IV heparin to Eliquis - echo obtained: EF 40-45%, global hypokinesis, Grade 1 DD, normal RV function  Renal mass - s/p robotic assisted laparoscopic left radical nephrectomy with retroperitoneal lymph node dissection on 12/15/2022  Abnormal echocardiogram - no CP nor prior heart disease; recent echo 11/30/22 obtained due to SOB/palpitations but showed EF 50-55% - now repeat echo shows EF 40-45% with global hypokinesis, Grade 1 DD - possibly from PE? -  will discuss with cardiology   Nausea and vomiting - began acutely 6/19; no prior N/V in hospitalization - unclear etiology but imaging studies on admission were notable for pneumomediastinum/pneumoperitoneum/abdominal wall emphysema - no changes in meds prior to onset - negative cardiac workup for ACS as cause - CT abd/pelvis repeated 6/20; does not seem acutely worse to necessarily explain symptoms - given hypoactive bowel sounds may just be a mild ileus although he endorses flatus and BM at times - NGT placed 6/20 with ~1000 cc output since; continue until output decreases or patient not tolerating; then will attempt clamp trial and re-initiate diet once removed  Pneumonia-resolved as of 12/23/2022 - Due to bilateral lower lobe consolidations noted on CT angio chest, patient started on CAP coverage for 5 days; course completed during hospitalization  Normocytic anemia - prior to surgery baseline ~12 g/dL; likely some ABLA with slow improvement  - continue trending while in hospital but should be stabilizing   Controlled type 2 diabetes mellitus without complication, without long-term current use of insulin (HCC) - continue SSI and CBG monitoring   Hypertension associated with diabetes (HCC) - continue losartan and lopressor  OSA (obstructive sleep apnea) - hold CPAP for now    Old records reviewed in assessment of this patient  Antimicrobials: Cefepime 6/16 x 1 Azithromycin 12/19/2022 >> 6/20 Rocephin 12/19/2022 >> 6/20  DVT prophylaxis:   apixaban (ELIQUIS) tablet 10 mg  apixaban (ELIQUIS) tablet 5 mg   Code Status:   Code Status: Full Code  Mobility Assessment (last 72 hours)     Mobility Assessment     Row Name 12/23/22 0831 12/22/22 2200 12/22/22 9562  12/21/22 2058     Does patient have an order for bedrest or is patient medically unstable No - Continue assessment No - Continue assessment No - Continue assessment No - Continue assessment    What is the highest  level of mobility based on the progressive mobility assessment? Level 5 (Walks with assist in room/hall) - Balance while stepping forward/back and can walk in room with assist - Complete Level 5 (Walks with assist in room/hall) - Balance while stepping forward/back and can walk in room with assist - Complete Level 5 (Walks with assist in room/hall) - Balance while stepping forward/back and can walk in room with assist - Complete Level 5 (Walks with assist in room/hall) - Balance while stepping forward/back and can walk in room with assist - Complete             Barriers to discharge:  Disposition Plan:  Home Status is: Inpt  Objective: Blood pressure (!) 132/92, pulse 96, temperature 98.5 F (36.9 C), temperature source Oral, resp. rate 16, height 5\' 10"  (1.778 m), weight 70.7 kg, SpO2 98 %.  Examination:  Physical Exam Constitutional:      General: He is not in acute distress.    Appearance: Normal appearance.  HENT:     Head: Normocephalic and atraumatic.     Nose:     Comments: NGT in place    Mouth/Throat:     Mouth: Mucous membranes are moist.  Eyes:     Extraocular Movements: Extraocular movements intact.  Cardiovascular:     Rate and Rhythm: Normal rate and regular rhythm.  Pulmonary:     Effort: Pulmonary effort is normal. No respiratory distress.     Breath sounds: Normal breath sounds. No wheezing.  Abdominal:     General: Bowel sounds are decreased. There is no distension.     Palpations: Abdomen is soft.     Tenderness: There is generalized abdominal tenderness (mild).     Comments: Midline surgical incision noted  Musculoskeletal:        General: Normal range of motion.     Cervical back: Normal range of motion and neck supple.  Skin:    General: Skin is warm and dry.  Neurological:     General: No focal deficit present.     Mental Status: He is alert.  Psychiatric:        Mood and Affect: Mood normal.        Behavior: Behavior normal.       Consultants:    Procedures:    Data Reviewed: Results for orders placed or performed during the hospital encounter of 12/18/22 (from the past 24 hour(s))  Glucose, capillary     Status: Abnormal   Collection Time: 12/23/22  4:28 PM  Result Value Ref Range   Glucose-Capillary 109 (H) 70 - 99 mg/dL  Glucose, capillary     Status: Abnormal   Collection Time: 12/23/22  8:39 PM  Result Value Ref Range   Glucose-Capillary 113 (H) 70 - 99 mg/dL  Glucose, capillary     Status: Abnormal   Collection Time: 12/24/22  7:22 AM  Result Value Ref Range   Glucose-Capillary 107 (H) 70 - 99 mg/dL  Glucose, capillary     Status: None   Collection Time: 12/24/22 11:24 AM  Result Value Ref Range   Glucose-Capillary 97 70 - 99 mg/dL    I have reviewed pertinent nursing notes, vitals, labs, and images as necessary. I have ordered labwork to follow up  on as indicated.  I have reviewed the last notes from staff over past 24 hours. I have discussed patient's care plan and test results with nursing staff, CM/SW, and other staff as appropriate.  Time spent: Greater than 50% of the 55 minute visit was spent in counseling/coordination of care for the patient as laid out in the A&P.   LOS: 5 days   Lewie Chamber, MD [facility] 12/24/2022, 2:54 PM

## 2022-12-25 DIAGNOSIS — I2694 Multiple subsegmental pulmonary emboli without acute cor pulmonale: Secondary | ICD-10-CM | POA: Diagnosis not present

## 2022-12-25 DIAGNOSIS — I519 Heart disease, unspecified: Secondary | ICD-10-CM

## 2022-12-25 DIAGNOSIS — I10 Essential (primary) hypertension: Secondary | ICD-10-CM | POA: Diagnosis not present

## 2022-12-25 DIAGNOSIS — N2889 Other specified disorders of kidney and ureter: Secondary | ICD-10-CM | POA: Diagnosis not present

## 2022-12-25 DIAGNOSIS — R112 Nausea with vomiting, unspecified: Secondary | ICD-10-CM | POA: Diagnosis not present

## 2022-12-25 DIAGNOSIS — R931 Abnormal findings on diagnostic imaging of heart and coronary circulation: Secondary | ICD-10-CM | POA: Diagnosis not present

## 2022-12-25 LAB — GLUCOSE, CAPILLARY: Glucose-Capillary: 103 mg/dL — ABNORMAL HIGH (ref 70–99)

## 2022-12-25 MED ORDER — METOPROLOL TARTRATE 25 MG PO TABS: 25.0000 mg | ORAL_TABLET | Freq: Two times a day (BID) | ORAL | 3 refills | Status: DC

## 2022-12-25 MED ORDER — LOSARTAN POTASSIUM 50 MG PO TABS: 50.0000 mg | ORAL_TABLET | Freq: Every day | ORAL | 3 refills | Status: DC

## 2022-12-25 MED ORDER — PHENOL 1.4 % MT LIQD
1.0000 | OROMUCOSAL | Status: DC | PRN
  Filled 2022-12-25: qty 177

## 2022-12-25 NOTE — Plan of Care (Signed)
Problem: Education: Goal: Knowledge of the prescribed therapeutic regimen will improve Outcome: Adequate for Discharge   Problem: Bowel/Gastric: Goal: Gastrointestinal status for postoperative course will improve Outcome: Adequate for Discharge   Problem: Clinical Measurements: Goal: Postoperative complications will be avoided or minimized Outcome: Adequate for Discharge   Problem: Respiratory: Goal: Ability to achieve and maintain a regular respiratory rate will improve Outcome: Adequate for Discharge   Problem: Skin Integrity: Goal: Demonstration of wound healing without infection will improve Outcome: Adequate for Discharge   Problem: Urinary Elimination: Goal: Ability to avoid or minimize complications of infection will improve Outcome: Adequate for Discharge Goal: Ability to achieve and maintain urine output will improve Outcome: Adequate for Discharge   Problem: Activity: Goal: Ability to tolerate increased activity will improve Outcome: Adequate for Discharge   Problem: Clinical Measurements: Goal: Ability to maintain a body temperature in the normal range will improve Outcome: Adequate for Discharge   Problem: Respiratory: Goal: Ability to maintain adequate ventilation will improve Outcome: Adequate for Discharge Goal: Ability to maintain a clear airway will improve Outcome: Adequate for Discharge   Problem: Education: Goal: Ability to describe self-care measures that may prevent or decrease complications (Diabetes Survival Skills Education) will improve Outcome: Adequate for Discharge Goal: Individualized Educational Video(s) Outcome: Adequate for Discharge   Problem: Coping: Goal: Ability to adjust to condition or change in health will improve Outcome: Adequate for Discharge   Problem: Fluid Volume: Goal: Ability to maintain a balanced intake and output will improve Outcome: Adequate for Discharge   Problem: Health Behavior/Discharge Planning: Goal:  Ability to identify and utilize available resources and services will improve Outcome: Adequate for Discharge Goal: Ability to manage health-related needs will improve Outcome: Adequate for Discharge   Problem: Metabolic: Goal: Ability to maintain appropriate glucose levels will improve Outcome: Adequate for Discharge   Problem: Nutritional: Goal: Maintenance of adequate nutrition will improve Outcome: Adequate for Discharge Goal: Progress toward achieving an optimal weight will improve Outcome: Adequate for Discharge   Problem: Skin Integrity: Goal: Risk for impaired skin integrity will decrease Outcome: Adequate for Discharge   Problem: Tissue Perfusion: Goal: Adequacy of tissue perfusion will improve Outcome: Adequate for Discharge   Problem: Education: Goal: Knowledge of General Education information will improve Description: Including pain rating scale, medication(s)/side effects and non-pharmacologic comfort measures Outcome: Adequate for Discharge   Problem: Health Behavior/Discharge Planning: Goal: Ability to manage health-related needs will improve Outcome: Adequate for Discharge   Problem: Clinical Measurements: Goal: Ability to maintain clinical measurements within normal limits will improve Outcome: Adequate for Discharge Goal: Will remain free from infection Outcome: Adequate for Discharge Goal: Diagnostic test results will improve Outcome: Adequate for Discharge Goal: Respiratory complications will improve Outcome: Adequate for Discharge Goal: Cardiovascular complication will be avoided Outcome: Adequate for Discharge   Problem: Activity: Goal: Risk for activity intolerance will decrease Outcome: Adequate for Discharge   Problem: Nutrition: Goal: Adequate nutrition will be maintained Outcome: Adequate for Discharge   Problem: Coping: Goal: Level of anxiety will decrease Outcome: Adequate for Discharge   Problem: Elimination: Goal: Will not  experience complications related to bowel motility Outcome: Adequate for Discharge Goal: Will not experience complications related to urinary retention Outcome: Adequate for Discharge   Problem: Pain Managment: Goal: General experience of comfort will improve Outcome: Adequate for Discharge   Problem: Safety: Goal: Ability to remain free from injury will improve Outcome: Adequate for Discharge   Problem: Skin Integrity: Goal: Risk for impaired skin integrity will decrease Outcome: Adequate  for Discharge

## 2022-12-25 NOTE — Discharge Summary (Signed)
Physician Discharge Summary   Paul Flowers WUJ:[id number] DOB: [birth year] DOA: 12/18/2022  PCP: Paul Seltzer, MD  Admit date: 12/18/2022 Discharge date: 12/25/2022   Admitted From: Home Disposition:  Home Discharging physician: Lewie Chamber, MD Barriers to discharge: none  Recommendations at discharge: Follow up with cardiology  Follow up with urology; may need oncology referral? Lopressor may need to be tapered off if HR decreases as PE resolves    Discharge Condition: stable CODE STATUS: Full Diet recommendation:  Diet Orders (From admission, onward)     Start     Ordered   12/25/22 1031  DIET SOFT Room service appropriate? Yes; Fluid consistency: Thin  Diet effective now       Question Answer Comment  Room service appropriate? 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 Upon discharging home he began developing pleuritic chest pains and shortness of breath.  He presented back to the ER with these complaints and underwent further workup. CT angio chest was obtained along with abdomen/pelvis which showed bilateral PE.  Also found to have pneumomediastinum, abdominal wall emphysema, and pneumoperitoneum which were considered post op findings from recent surgery.  He was initially started on heparin which was transitioned to Eliquis.  See below for further A&P.   Assessment and Plan: * Multiple subsegmental pulmonary emboli without acute cor pulmonale (HCC) - per CTA chest on 6/16: "Bilateral acute arterial emboli with overall moderate clot burden but no findings of acute right heart strain. Possible  at least some of the embolic disease could be tumor thrombus." - s/p IV heparin to Eliquis - echo obtained: EF 40-45%, global hypokinesis, Grade 1 DD, normal RV function  Renal mass - s/p robotic assisted laparoscopic left radical nephrectomy with retroperitoneal lymph node dissection on 12/15/2022  Abnormal echocardiogram - no CP nor prior heart disease; recent echo 11/30/22 obtained due to SOB/palpitations but showed EF 50-55% - now repeat echo shows EF 40-45% with global hypokinesis, Grade 1 DD - possibly from PE? -Appreciate cardiology evaluation.  After echo review, there was not felt to be significant change in LV contractility; medications were adjusted prior to discharge (losartan increased and Lopressor continued due to persistent tachycardia) -Outpatient follow-up will be arranged with cardiology to consider any further LV assessment   Nausea and vomiting-resolved as of 12/25/2022 - began acutely 6/19; no prior N/V in hospitalization - unclear etiology but imaging studies on admission were notable for pneumomediastinum/pneumoperitoneum/abdominal wall emphysema - no changes in meds prior to onset - negative cardiac workup for ACS as cause - CT abd/pelvis repeated 6/20; does not seem acutely worse to explain symptoms - given hypoactive bowel sounds may just be a mild ileus although he endorses flatus and BM at times - NGT placed 6/20 with ~2000 cc output since; with monitoring, output did decrease.  He underwent clamp trial on 12/24/2022 with no return of nausea/vomiting.  NG tube was removed and he tolerated clear liquid diet well; diet to be advanced further at home  Pneumonia-resolved as of 12/23/2022 - Due to bilateral lower lobe consolidations noted on CT angio chest, patient started on CAP coverage for 5 days; course completed during hospitalization  Normocytic anemia - prior to surgery baseline ~12 g/dL; likely some ABLA with slow improvement  -Hemoglobin improved back to 10  g/dL prior to discharge  Controlled type 2 diabetes mellitus without complication, without long-term current use of insulin (HCC) - Resume home regimen  Hypertension associated with diabetes (HCC) - continue losartan and lopressor -Dosage of losartan increased to 50 mg daily (from 25 mg daily) -Lopressor is new medication and continued at discharge due to persistent elevated heart rate; as clot burden lessens, may not need medication in future.  Patient instructed to monitor his heart rate at home upon discharge  OSA (obstructive sleep apnea) - Okay to resume home CPAP at discharge   The patient's chronic medical conditions were treated accordingly per the patient's home medication regimen except as noted.  On day of discharge, patient was felt deemed stable for discharge. Patient/family member advised to call PCP or come back to ER if needed.   Principal Diagnosis: Multiple subsegmental pulmonary emboli without acute cor pulmonale [facility])  Discharge Diagnoses: Active Hospital Problems   Diagnosis Date Noted   Multiple subsegmental pulmonary emboli without acute cor pulmonale (HCC) 12/19/2022    Priority: 1.   Renal mass 12/15/2022    Priority: 2.   Abnormal echocardiogram 12/23/2022    Priority: 3.   Normocytic anemia 10/19/2022    Priority: 4.   Controlled type 2 diabetes mellitus without complication, without long-term current use of insulin (HCC) 09/24/2014   Hypertension associated with diabetes (HCC) 11/09/2013   OSA (obstructive sleep apnea) 03/19/2011    Resolved Hospital Problems   Diagnosis Date Noted Date Resolved   Nausea and vomiting 12/22/2022 12/25/2022    Priority: 3.   Pneumonia 12/19/2022 12/23/2022    Priority: 3.     Discharge Instructions     Diet general   Complete by: As directed    Diet general   Complete by: As directed    Increase activity slowly   Complete by: As directed    Increase activity slowly   Complete by: As directed        Allergies as of 12/25/2022   No Known Allergies      Medication List     TAKE these medications    atorvastatin 20 MG tablet Commonly known as: LIPITOR TAKE 1 TABLET BY MOUTH EVERY DAY   docusate sodium 100 MG capsule Commonly known as: COLACE Take 1 capsule (100 mg total) by mouth 2 (two) times daily.   Eliquis DVT/PE Starter Pack Generic drug: Apixaban Starter Pack (10mg  and 5mg ) Take as directed on package: start with two-5mg  tablets twice daily for 7 days. On day 8, switch to one-5mg  tablet twice daily.   apixaban 5 MG Tabs tablet Commonly known as: ELIQUIS Take 1 tablet (5 mg total) by mouth 2 (two) times daily. Start taking on: January 10, 2023   fluticasone 50 MCG/ACT nasal spray Commonly known as: FLONASE Place 1 spray into both nostrils daily.   HYDROcodone-acetaminophen 5-325 MG tablet Commonly known as: Norco Take 1-2 tablets by mouth every 6 (six) hours as needed for moderate pain or severe pain.   losartan 50 MG tablet Commonly known as: COZAAR Take 1 tablet (50 mg total) by mouth daily. What changed:  medication strength how much to take   metoprolol tartrate 25 MG tablet Commonly known  as: LOPRESSOR Take 1 tablet (25 mg total) by mouth 2 (two) times daily.   montelukast 10 MG tablet Commonly known as: SINGULAIR TAKE 1 TABLET BY MOUTH EVERYDAY AT BEDTIME What changed: See the new instructions.   omeprazole 20 MG capsule Commonly known as: PRILOSEC TAKE 1 CAPSULE BY MOUTH EVERY DAY   ONE TOUCH LANCETS Misc Use to test blood sugar once daily   sodium chloride 0.65 % Soln nasal spray Commonly known as: OCEAN Place 1 spray into both nostrils in the morning and at bedtime.   Xigduo XR 11-998 MG Tb24 Generic drug: Dapagliflozin Pro-metFORMIN ER TAKE 1 TABLET BY MOUTH EVERY DAY        No Known Allergies  Consultations: Urology  Procedures:   Discharge Exam: BP (!) 136/99 (BP Location: Left Arm)   Pulse 92   Temp 98.9 F (37.2 C)  (Oral)   Resp 19   Ht 5\' 10"  (1.778 m)   Wt 69.1 kg   SpO2 98%   BMI 21.86 kg/m  Physical Exam Constitutional:      General: He is not in acute distress.    Appearance: Normal appearance.  HENT:     Head: Normocephalic and atraumatic.     Mouth/Throat:     Mouth: Mucous membranes are moist.  Eyes:     Extraocular Movements: Extraocular movements intact.  Cardiovascular:     Rate and Rhythm: Normal rate and regular rhythm.  Pulmonary:     Effort: Pulmonary effort is normal. No respiratory distress.     Breath sounds: Normal breath sounds. No wheezing.  Abdominal:     General: Bowel sounds are normal. There is no distension.     Palpations: Abdomen is soft.     Tenderness: There is no abdominal tenderness.     Comments: Midline surgical incision noted  Musculoskeletal:        General: Normal range of motion.     Cervical back: Normal range of motion and neck supple.  Skin:    General: Skin is warm and dry.  Neurological:     General: No focal deficit present.     Mental Status: He is alert.  Psychiatric:        Mood and Affect: Mood normal.        Behavior: Behavior normal.      The results of significant diagnostics from this hospitalization (including imaging, microbiology, ancillary and laboratory) are listed below for reference.   Microbiology: Recent Results (from the past 240 hour(s))  Blood culture (routine x 2)     Status: None   Collection Time: 12/19/22 12:34 AM   Specimen: BLOOD  Result Value Ref Range Status   Specimen Description   Final    BLOOD LEFT ANTECUBITAL Performed at [facility], [address], [place], [place] [address]    Special Requests   Final    BOTTLES DRAWN AEROBIC AND ANAEROBIC Blood Culture adequate volume Performed at [facility], [address] [place], [place] [address]    Culture   Final    NO GROWTH 5 DAYS Performed at [facility], [address], [place], [place] [address]     Report Status 12/24/2022 FINAL  Final  Blood culture (routine x 2)     Status: None   Collection Time: 12/19/22  5:33 AM   Specimen: BLOOD LEFT ARM  Result Value Ref Range Status   Specimen Description   Final    BLOOD LEFT ARM  Performed at [facility], [address] [place], [place] [address]    Special Requests   Final    BOTTLES DRAWN AEROBIC AND ANAEROBIC Blood Culture adequate volume Performed at [facility], [address], [place], [place] [address]    Culture   Final    NO GROWTH 5 DAYS Performed at [facility], [address] [place], [place] [address]    Report Status 12/24/2022 FINAL  Final     Labs: BNP (last 3 results) Recent Labs    12/19/22 0002  BNP 32.3   Basic Metabolic Panel: Recent Labs  Lab 12/19/22 0002 12/19/22 0533 12/20/22 0438 12/21/22 0500 12/22/22 1839  NA 138 137 138 137 137  K 3.4* 3.3* 3.2* 3.5 3.7  CL 102 104 103 102 100  CO2 26 24 26 26 23   GLUCOSE 147* 114* 158* 119* 103*  BUN 11 10 9 9 11   CREATININE 1.18 0.90 0.99 0.92 0.92  CALCIUM 8.5* 8.1* 8.4* 8.7* 9.1  MG  --  1.8  --   --  1.9   Liver Function Tests: Recent Labs  Lab 12/19/22 0002  AST 12*  ALT 13  ALKPHOS 79  BILITOT 0.8  PROT 7.9  ALBUMIN 2.6*   No results for input(s): "LIPASE", "AMYLASE" in the last 168 hours. No results for input(s): "AMMONIA" in the last 168 hours. CBC: Recent Labs  Lab 12/19/22 0002 12/20/22 0438 12/21/22 0500 12/22/22 1839  WBC 11.0* 10.0 8.5 11.1*  NEUTROABS 7.8*  --   --  8.4*  HGB 9.3* 8.3* 8.5* 10.0*  HCT 30.7* 26.7* 27.9* 33.4*  MCV 80.6 80.2 80.4 80.7  PLT 253 259 279 360   Cardiac Enzymes: No results for input(s): "CKTOTAL", "CKMB", "CKMBINDEX", "TROPONINI" in the last 168 hours. BNP: Invalid input(s): "POCBNP" CBG: Recent Labs  Lab 12/24/22 0722 12/24/22 1124 12/24/22 1631 12/24/22 2108 12/25/22 0720  GLUCAP 107* 97 96 82 103*   D-Dimer No results for  input(s): "DDIMER" in the last 72 hours. Hgb A1c No results for input(s): "HGBA1C" in the last 72 hours. Lipid Profile No results for input(s): "CHOL", "HDL", "LDLCALC", "TRIG", "CHOLHDL", "LDLDIRECT" in the last 72 hours. Thyroid function studies No results for input(s): "TSH", "T4TOTAL", "T3FREE", "THYROIDAB" in the last 72 hours.  Invalid input(s): "FREET3" Anemia work up No results for input(s): "VITAMINB12", "FOLATE", "FERRITIN", "TIBC", "IRON", "RETICCTPCT" in the last 72 hours. Urinalysis    Component Value Date/Time   COLORURINE YELLOW 12/19/2022 0126   APPEARANCEUR CLEAR 12/19/2022 0126   LABSPEC 1.011 12/19/2022 0126   PHURINE 7.0 12/19/2022 0126   GLUCOSEU >=500 (A) 12/19/2022 0126   GLUCOSEU NEGATIVE 02/27/2009 0917   HGBUR NEGATIVE 12/19/2022 0126   BILIRUBINUR NEGATIVE 12/19/2022 0126   BILIRUBINUR Negative 10/14/2022 0900   KETONESUR NEGATIVE 12/19/2022 0126   PROTEINUR NEGATIVE 12/19/2022 0126   UROBILINOGEN 0.2 10/14/2022 0900   UROBILINOGEN 1.0 10/29/2014 0524   NITRITE NEGATIVE 12/19/2022 0126   LEUKOCYTESUR NEGATIVE 12/19/2022 0126   Sepsis Labs Recent Labs  Lab 12/19/22 0002 12/20/22 0438 12/21/22 0500 12/22/22 1839  WBC 11.0* 10.0 8.5 11.1*   Microbiology Recent Results (from the past 240 hour(s))  Blood culture (routine x 2)     Status: None   Collection Time: 12/19/22 12:34 AM   Specimen: BLOOD  Result Value Ref Range Status   Specimen Description   Final    BLOOD LEFT ANTECUBITAL Performed at [facility], [address]  [place], [place] [address]    Special Requests   Final    BOTTLES DRAWN AEROBIC AND ANAEROBIC Blood Culture adequate volume Performed at [facility], [address], [place], [place] [address]    Culture   Final    NO GROWTH 5 DAYS Performed at [facility], [address] [place], [place] [address]    Report Status 12/24/2022 FINAL  Final  Blood culture (routine x 2)      Status: None   Collection Time: 12/19/22  5:33 AM   Specimen: BLOOD LEFT ARM  Result Value Ref Range Status   Specimen Description   Final    BLOOD LEFT ARM Performed at [facility], [address], [place], [place] [address]    Special Requests   Final    BOTTLES DRAWN AEROBIC AND ANAEROBIC Blood Culture adequate volume Performed at [facility], [address] [place], [place] [address]    Culture   Final    NO GROWTH 5 DAYS Performed at [facility], [address], [place], [place] [address]    Report Status 12/24/2022 FINAL  Final    Procedures/Studies: DG Abd 1 View  Result Date: 12/23/2022 CLINICAL DATA:  Nasogastric tube placement EXAM: ABDOMEN - 1 VIEW COMPARISON:  12/23/2022 CT scan FINDINGS: Nasogastric tube noted with tip in the left upper quadrant compatible with the junction of the gastric body and fundus. Continued pneumoperitoneum observed under the right hemidiaphragm and along the stomach. This is presumably postoperative given the recent operative history, and does not appear substantially changed from the CT scan from earlier today. Continued mild airspace opacity in the left lower lobe, not changed from earlier CT scan and mostly due to atelectasis. The patient also has a trace left pleural effusion. IMPRESSION: 1. Nasogastric tube tip in the left upper quadrant compatible with the junction of the gastric body and fundus. 2. Stable pneumoperitoneum (reportedly postoperative). 3. Continued mild airspace opacity in the left lower lobe, mostly due to atelectasis. 4. Trace left pleural effusion. Electronically Signed   By: Gaylyn Rong M.D.   On: 12/23/2022 19:56   CT ABDOMEN PELVIS W CONTRAST  Result Date: 12/23/2022 CLINICAL DATA:  Postoperative abdominal pain. EXAM: CT ABDOMEN AND PELVIS WITH CONTRAST TECHNIQUE: Multidetector CT imaging of the abdomen and pelvis was performed using the standard protocol following bolus  administration of intravenous contrast. RADIATION DOSE REDUCTION: This exam was performed according to the departmental dose-optimization program which includes automated exposure control, adjustment of the mA and/or kV according to patient size and/or use of iterative reconstruction technique. CONTRAST:  80 mL OMNIPAQUE IOHEXOL 300 MG/ML  SOLN COMPARISON:  December 19, 2022.  October 28, 2022. FINDINGS: Lower chest: Mild bilateral posterior basilar subsegmental atelectasis. Hepatobiliary: Small gallstone. No biliary dilatation. Liver is otherwise unremarkable. Pancreas: Unremarkable. No pancreatic ductal dilatation or surrounding inflammatory changes. Spleen: Normal in size without focal abnormality. Adrenals/Urinary Tract: Status post left adreno-nephrectomy. Right adrenal gland is unremarkable. Small nonobstructive right renal calculus is noted. No hydronephrosis or renal obstruction is noted. Urinary bladder is unremarkable. Stable appearance of fluid in left nephrectomy bed most consistent with postoperative seroma. Stomach/Bowel: Stomach is within normal limits. Appendix appears normal. No evidence of bowel wall thickening, distention, or inflammatory changes. Vascular/Lymphatic: No significant adenopathy is noted. There remains a small amount of thrombus within the residual ligated left renal vein which extends into the IVC. Reproductive: Prostate is unremarkable. Other: Continued presence of pneumoperitoneum which  is most likely related to postoperative status. Subcutaneous gas is noted anteriorly in the pelvis as well as in the left anterior abdominal wall also most consistent with recent laparoscopic surgery. Musculoskeletal: There is interval development of 2.2 cm lucency in the right femoral neck concerning for possible metastatic disease. IMPRESSION: Interval development of 2.2 cm rounded lucency in right femoral neck since October 28, 2022, concerning for possible metastatic disease. Further evaluation with  MRI is recommended. Status post surgical resection of left adrenal gland and kidney. Stable appearance of fluid is seen in left nephrectomy bed most consistent with postoperative seroma. Stable small nonobstructive right renal calculus. Continued presence of pneumoperitoneum is noted as well as subcutaneous gas anteriorly in the pelvis and left anterior abdominal wall consistent with recent laparoscopic surgery. Stable amount of thrombus seen extending from residual ligated left renal vein into IVC. Mild bibasilar subsegmental atelectasis is noted. Electronically Signed   By: Lupita Raider M.D.   On: 12/23/2022 11:14   DG Abd Portable 1V  Result Date: 12/22/2022 CLINICAL DATA:  Nausea and vomiting. EXAM: PORTABLE ABDOMEN - 1 VIEW COMPARISON:  Abdominal CT dated 12/19/2022. FINDINGS: No bowel dilatation or evidence of obstruction. The free air seen on the CT not evaluated on this radiograph. Cross-table lateral or decubitus views or alternatively AP view in the upright position may provide better evaluation of the pneumoperitoneum. No radiopaque calculi. The osseous structures are intact. The soft tissues are unremarkable. IMPRESSION: Nonobstructive bowel gas pattern. Electronically Signed   By: Elgie Collard M.D.   On: 12/22/2022 18:44   DG CHEST PORT 1 VIEW  Result Date: 12/22/2022 CLINICAL DATA:  Pneumoperitoneum EXAM: PORTABLE CHEST 1 VIEW COMPARISON:  12/19/2022, 12/16/2022 FINDINGS: Airspace disease at left base. Stable cardiomediastinal silhouette. No pneumothorax. Residual pneumoperitoneum appears decreased from 12/16/2022, probably decreased from 12/19/2022. IMPRESSION: 1. Persistent left basilar airspace disease. 2. Residual pneumoperitoneum, appears decreased since 12/16/2022, probably decreased from 12/19/2022 but continued radiographic follow-up recommended to ensure resolution Electronically Signed   By: Jasmine Pang M.D.   On: 12/22/2022 18:42   ECHOCARDIOGRAM COMPLETE  Result Date:  12/22/2022    ECHOCARDIOGRAM REPORT   Patient Name:   Paul Flowers Date of Exam: 12/22/2022 Medical Rec #:  [id number]      Height:       70.0 in Accession #:    [id number]     Weight:       166.0 lb Date of Birth:  1971/07/02      BSA:          1.928 m Patient Age:    51 years       BP:           138/94 mmHg Patient Gender: M              HR:           98 bpm. Exam Location:  Inpatient Procedure: 2D Echo, 3D Echo, Cardiac Doppler and Color Doppler Indications:    I26.02 Pulmonary embolus  History:        Patient has prior history of Echocardiogram examinations, most                 recent 11/30/2022. Abnormal ECG, Arrythmias:Tachycardia,                 Signs/Symptoms:Dyspnea; Risk Factors:Sleep Apnea, Diabetes,                 Hypertension and Dyslipidemia. Pulmonary embolus.  Sonographer:  Sheralyn Boatman RDCS Referring Phys: [id number] Syanne Looney  Sonographer Comments: Technically difficult study due to poor echo windows and suboptimal parasternal window. Patient began to vomit during subcostals. Exam ended due to patient vomiting. IMPRESSIONS  1. Left ventricular ejection fraction, by estimation, is 40 to 45%. The left ventricle has mildly decreased function. The left ventricle demonstrates global hypokinesis. Left ventricular diastolic parameters are consistent with Grade I diastolic dysfunction (impaired relaxation).  2. Right ventricular systolic function is normal. The right ventricular size is normal. Tricuspid regurgitation signal is inadequate for assessing PA pressure.  3. The mitral valve is normal in structure. Trivial mitral valve regurgitation. No evidence of mitral stenosis.  4. The aortic valve is normal in structure. Aortic valve regurgitation is trivial. No aortic stenosis is present. FINDINGS  Left Ventricle: Left ventricular ejection fraction, by estimation, is 40 to 45%. The left ventricle has mildly decreased function. The left ventricle demonstrates global hypokinesis. The left ventricular internal  cavity size was normal in size. There is  no left ventricular hypertrophy. Left ventricular diastolic parameters are consistent with Grade I diastolic dysfunction (impaired relaxation). Normal left ventricular filling pressure. Right Ventricle: The right ventricular size is normal. No increase in right ventricular wall thickness. Right ventricular systolic function is normal. Tricuspid regurgitation signal is inadequate for assessing PA pressure. Left Atrium: Left atrial size was normal in size. Right Atrium: Right atrial size was normal in size. Pericardium: There is no evidence of pericardial effusion. Mitral Valve: The mitral valve is normal in structure. Trivial mitral valve regurgitation. No evidence of mitral valve stenosis. Tricuspid Valve: The tricuspid valve is normal in structure. Tricuspid valve regurgitation is trivial. No evidence of tricuspid stenosis. Aortic Valve: The aortic valve is normal in structure. Aortic valve regurgitation is trivial. No aortic stenosis is present. Pulmonic Valve: The pulmonic valve was not well visualized. Pulmonic valve regurgitation is not visualized. No evidence of pulmonic stenosis. Aorta: The aortic root is normal in size and structure. Venous: The inferior vena cava was not well visualized. IAS/Shunts: No atrial level shunt detected by color flow Doppler.  LEFT VENTRICLE PLAX 2D LVIDd:         4.10 cm      Diastology LVIDs:         3.30 cm      LV e' medial:    9.25 cm/s LV PW:         1.00 cm      LV E/e' medial:  6.5 LV IVS:        1.00 cm      LV e' lateral:   10.90 cm/s LVOT diam:     2.20 cm      LV E/e' lateral: 5.5 LV SV:         58 LV SV Index:   30 LVOT Area:     3.80 cm                              3D Volume EF: LV Volumes (MOD)            3D EF:        39 % LV vol d, MOD A2C: 96.9 ml  LV EDV:       137 ml LV vol d, MOD A4C: 104.0 ml LV ESV:       83 ml LV vol s, MOD A2C: 55.4 ml  LV SV:  54 ml LV vol s, MOD A4C: 59.2 ml LV SV MOD A2C:     41.5 ml LV  SV MOD A4C:     104.0 ml LV SV MOD BP:      44.7 ml RIGHT VENTRICLE RV S prime:     6.74 cm/s TAPSE (M-mode): 1.1 cm LEFT ATRIUM           Index        RIGHT ATRIUM          Index LA diam:      3.40 cm 1.76 cm/m   RA Area:     7.97 cm LA Vol (A2C): 22.2 ml 11.51 ml/m  RA Volume:   11.00 ml 5.70 ml/m LA Vol (A4C): 23.3 ml 12.08 ml/m  AORTIC VALVE LVOT Vmax:   90.80 cm/s LVOT Vmean:  62.900 cm/s LVOT VTI:    0.152 m  AORTA Ao Root diam: 3.20 cm Ao Asc diam:  3.30 cm MITRAL VALVE MV Area (PHT): 4.82 cm    SHUNTS MV Decel Time: 157 msec    Systemic VTI:  0.15 m MV E velocity: 59.90 cm/s  Systemic Diam: 2.20 cm MV A velocity: 83.80 cm/s MV E/A ratio:  0.71 Armanda Magic MD Electronically signed by Armanda Magic MD Signature Date/Time: 12/22/2022/2:37:07 PM    Final    VAS [place] LOWER EXTREMITY VENOUS (DVT)  Result Date: 12/20/2022  Lower Venous DVT Study Patient Name:  Paul Flowers  Date of Exam:   12/19/2022 Medical Rec #: [id number]       Accession #:    [id number] Date of Birth: April 10, 1971       Patient Gender: M Patient Age:   17 years Exam Location:  [facility] Procedure:      VAS [place] LOWER EXTREMITY VENOUS (DVT) Referring Phys: Marcial Pacas OPYD --------------------------------------------------------------------------------  Indications: Pulmonary embolism. 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 Comparison Study: No prior studies. Performing Technologist: Jean Rosenthal RDMS, RVT  Examination Guidelines: A complete evaluation includes B-mode imaging, spectral Doppler, color Doppler, and power Doppler as needed of all accessible portions of each vessel. Bilateral testing is considered an integral part of a complete examination. Limited examinations for reoccurring indications may be performed as noted. The reflux portion of the exam is performed with the patient in reverse Trendelenburg.   +---------+---------------+---------+-----------+----------+--------------+ RIGHT    CompressibilityPhasicitySpontaneityPropertiesThrombus Aging +---------+---------------+---------+-----------+----------+--------------+ CFV      Full           Yes                                          +---------+---------------+---------+-----------+----------+--------------+ SFJ      Full                                                        +---------+---------------+---------+-----------+----------+--------------+ FV Prox  Full                                                        +---------+---------------+---------+-----------+----------+--------------+  FV Mid   Full                                                        +---------+---------------+---------+-----------+----------+--------------+ FV DistalFull           Yes      Yes                                 +---------+---------------+---------+-----------+----------+--------------+ PFV      Full                                                        +---------+---------------+---------+-----------+----------+--------------+ POP      Full           Yes      Yes                                 +---------+---------------+---------+-----------+----------+--------------+ PTV      Full                                                        +---------+---------------+---------+-----------+----------+--------------+ PERO     Full                                                        +---------+---------------+---------+-----------+----------+--------------+   +---------+---------------+---------+-----------+----------+--------------+ LEFT     CompressibilityPhasicitySpontaneityPropertiesThrombus Aging +---------+---------------+---------+-----------+----------+--------------+ CFV      Full           Yes      Yes                                  +---------+---------------+---------+-----------+----------+--------------+ SFJ      Full                                                        +---------+---------------+---------+-----------+----------+--------------+ FV Prox  Full                                                        +---------+---------------+---------+-----------+----------+--------------+ FV Mid   Full                                                        +---------+---------------+---------+-----------+----------+--------------+  FV DistalFull           Yes      Yes                                 +---------+---------------+---------+-----------+----------+--------------+ PFV      Full                                                        +---------+---------------+---------+-----------+----------+--------------+ POP      Full           Yes      Yes                                 +---------+---------------+---------+-----------+----------+--------------+ PTV      Full                                                        +---------+---------------+---------+-----------+----------+--------------+ PERO     Full                                                        +---------+---------------+---------+-----------+----------+--------------+     Summary: RIGHT: - There is no evidence of deep vein thrombosis in the lower extremity.  - No cystic structure found in the popliteal fossa.  LEFT: - There is no evidence of deep vein thrombosis in the lower extremity.  - No cystic structure found in the popliteal fossa.  *See table(s) above for measurements and observations. Electronically signed by Sherald Hess MD on 12/20/2022 at 9:24:00 AM.    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 Bronchial biopsy procedure also was done on 12/09/2022. Pulmonary  embolism suspected, high probability. Abdominal pain also. EXAM: CT ANGIOGRAPHY CHEST CT ABDOMEN AND PELVIS WITH CONTRAST TECHNIQUE: Multidetector CT imaging of the chest was performed using the standard protocol during bolus administration of intravenous contrast. Multiplanar CT image reconstructions and MIPs were obtained to evaluate the vascular anatomy. Multidetector CT imaging of the abdomen and pelvis was performed using the standard protocol during bolus administration of intravenous contrast. RADIATION DOSE REDUCTION: This exam was performed according to the departmental dose-optimization program which includes automated exposure control, adjustment of the mA and/or kV according to patient size and/or use of iterative reconstruction technique. CONTRAST:  OMNIPAQUE IOHEXOL 350 MG/ML SOLN COMPARISON:  Portable chest today, portable chest 12/16/2022 and 12/09/2022, chest CT without contrast 12/07/2022, CTA chest 10/15/2022, MRI abdomen 11/15/2022 and CT abdomen and pelvis with contrast 10/28/2022. FINDINGS: CTA CHEST FINDINGS Cardiovascular: The pulmonary trunk is upper limit of normal in caliber at 2.8 cm, was previously 2.3 cm. There are bilateral acute arterial emboli, overall moderate clot burden. There is no increase in the RV/LV ratio and no significant IVC reflux pattern. No  acute right heart strain is suspected. On the left, several subsegmental emboli are noted in the lingula and apical segments. There is thrombus in the distal left lower lobe main artery extending into all of the segmental arteries with nonocclusive thrombus, and in a few scattered downstream subsegmental arteries where there is probably occlusive thrombus in the some of the basal subsegments. On the right, there are scattered subsegmental arterial partially occlusive emboli in the anterior and apical segments. There are a few subsegmental emboli in the lateral and medial right middle lobe segments some of which appear  occlusive. There are additional scattered subsegmental emboli in the basal segments of the lower lobe and in the superior segment. There is no pericardial effusion. There is mild cardiomegaly with a left chamber predominance, interval increased. There is trace calcification in the LAD coronary artery. Thoracic aorta is tortuous, minimal calcific plaque in the distal arch is seen without aneurysm, stenosis or dissection. The great vessels are clear.  Pulmonary veins are nondistended. Mediastinum/Nodes: There is extensive pneumomediastinum which is primarily anterior, some of this extending into the right paratracheal and left-to-right lateral prevascular spaces. This probably tracked up from the abdomen where there is free intraperitoneal air presumably from the recent surgery. Esophageal thickness is normal. None of the air is seen around the esophagus. The trachea and main bronchi are clear. Unremarkable thyroid gland. No axillary or intrathoracic adenopathy. Lungs/Pleura: Small layering left pleural effusion. No right pleural effusion. No pneumothorax. There is posterior atelectasis in both lungs. New finding of patchy dense consolidation in the left lower lobe basal segments, focal consolidation in the superior segment of the right lower lobe findings could be due to pneumonia, infarctions or combination. No interstitial edema is seen. Remaining lungs are generally clear. 8 mm left lower lobe nodule noted previously is obscured due to left lung consolidation. Musculoskeletal: No aggressive bone lesion is seen. Review of the MIP images confirms the above findings. CT ABDOMEN and PELVIS FINDINGS Hepatobiliary: There is a small stone in the distal gallbladder. No wall thickening or bile duct dilatation. There is no liver mass enhancement. Pancreas: No abnormality. Spleen: No abnormality. Adrenals/Urinary Tract: Interval left nephroadrenalectomy. There are scattered retroperitoneal air pockets in the operative bed.  There is mild low-density fluid settling posteriorly in the left renal fossa, with low-density fluid measuring 7.4 x 2.8 x 12.4 cm. This is probably a postoperative seroma or residual postoperative fluid. There is no right adrenal or renal mass enhancement. There is a 5 mm nonobstructive stone in the posterior right kidney. No obstructing stone is evident. There is mild bladder thickening versus underdistention. Correlate with urinalysis. Stomach/Bowel: Chronic gastric fold thickening. Likely chronic gastritis. The appendix and bowel are unremarkable. Vascular/Lymphatic: There is a small volume of thrombus in the IVC extending into it from the ligated left renal vein. On the MRI 11/15/2022 there was enhancing tumor thrombus in the left renal vein but not in the IVC. This could be a small tumor thrombus. Also not entirely certain some of the bilateral pulmonary arterial emboli are not tumor thrombus as well. No other significant vascular findings. There previously was left periaortic chain adenopathy but this has been removed. Reproductive: Uterus and bilateral adnexa are unremarkable. Other: There is extensive pneumoperitoneum, but this is almost all anterior. Some of it tracks along the left dorsal retroperitoneal planes but there are no air pockets along side of the bowel. This is probably postoperative free air, with hollow viscous perforation not strictly excluded. There is additional  moderately extensive left abdominal wall emphysema, tracking into the groin areas. Some of the intraperitoneal air tracks into the inguinal canals and left hemiscrotum. No abscess is seen. A small amount of free fluid of low-density collects in the posterior right deep pelvis. Additional scattered fluid extends along the left retroperitoneum anterior to the aorta. Musculoskeletal: No aggressive regional osseous lesions. Review of the MIP images confirms the above findings. IMPRESSION: 1. Bilateral acute arterial emboli with overall  moderate clot burden but no findings of acute right heart strain. Possible at least some of the embolic disease could be tumor thrombus. 2. Mild cardiomegaly with trace calcification in the LAD coronary artery. 3. Small left pleural effusion. 4. Bilateral lower lobe consolidations, left greater than right, could be due to pneumonia, infarctions or combination. 5. Extensive pneumomediastinum, and extensive left abdominal wall emphysema tracking into the inguinal canals and left hemiscrotum. 6. Small volume of thrombus in the IVC extending into it from the ligated left renal vein. On the MRI 11/15/2022 there was enhancing tumor thrombus in the left renal vein but not in the IVC. This could be a small tumor thrombus. 7. Interval left nephroadrenalectomy with low-density fluid settling posteriorly in the left renal fossa measuring 7.4 x 2.8 x 12.4 cm. This is probably a postoperative seroma or residual postoperative fluid. 8. Extensive pneumoperitoneum, almost all of which is anterior. This is probably postoperative free air, with hollow viscus perforation not strictly excluded. 9. Small amount of low-density free fluid in the posterior right deep pelvis. 10. Cholelithiasis. 11. Nonobstructive nephrolithiasis. 12. Aortic atherosclerosis.  Remaining findings described above. 13. Critical Value/emergent results were called by telephone at the time of interpretation on 12/19/2022 at 2:06 am to provider [facility] , who verbally acknowledged these results. Aortic Atherosclerosis (ICD10-I70.0). Electronically Signed   By: Almira Bar M.D.   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 Bronchial biopsy procedure also was done on 12/09/2022. Pulmonary embolism suspected, high probability. Abdominal pain also. EXAM: CT ANGIOGRAPHY CHEST CT ABDOMEN AND PELVIS WITH CONTRAST  TECHNIQUE: Multidetector CT imaging of the chest was performed using the standard protocol during bolus administration of intravenous contrast. Multiplanar CT image reconstructions and MIPs were obtained to evaluate the vascular anatomy. Multidetector CT imaging of the abdomen and pelvis was performed using the standard protocol during bolus administration of intravenous contrast. RADIATION DOSE REDUCTION: This exam was performed according to the departmental dose-optimization program which includes automated exposure control, adjustment of the mA and/or kV according to patient size and/or use of iterative reconstruction technique. CONTRAST:  OMNIPAQUE IOHEXOL 350 MG/ML SOLN COMPARISON:  Portable chest today, portable chest 12/16/2022 and 12/09/2022, chest CT without contrast 12/07/2022, CTA chest 10/15/2022, MRI abdomen 11/15/2022 and CT abdomen and pelvis with contrast 10/28/2022. FINDINGS: CTA CHEST FINDINGS Cardiovascular: The pulmonary trunk is upper limit of normal in caliber at 2.8 cm, was previously 2.3 cm. There are bilateral acute arterial emboli, overall moderate clot burden. There is no increase in the RV/LV ratio and no significant IVC reflux pattern. No acute right heart strain is suspected. On the left, several subsegmental emboli are noted in the lingula and apical segments. There is thrombus in the distal left lower lobe main artery extending into all of the segmental arteries with nonocclusive thrombus, and in a few scattered downstream subsegmental arteries where there is probably occlusive thrombus in the some of  the basal subsegments. On the right, there are scattered subsegmental arterial partially occlusive emboli in the anterior and apical segments. There are a few subsegmental emboli in the lateral and medial right middle lobe segments some of which appear occlusive. There are additional scattered subsegmental emboli in the basal segments of the lower lobe and in the superior segment.  There is no pericardial effusion. There is mild cardiomegaly with a left chamber predominance, interval increased. There is trace calcification in the LAD coronary artery. Thoracic aorta is tortuous, minimal calcific plaque in the distal arch is seen without aneurysm, stenosis or dissection. The great vessels are clear.  Pulmonary veins are nondistended. Mediastinum/Nodes: There is extensive pneumomediastinum which is primarily anterior, some of this extending into the right paratracheal and left-to-right lateral prevascular spaces. This probably tracked up from the abdomen where there is free intraperitoneal air presumably from the recent surgery. Esophageal thickness is normal. None of the air is seen around the esophagus. The trachea and main bronchi are clear. Unremarkable thyroid gland. No axillary or intrathoracic adenopathy. Lungs/Pleura: Small layering left pleural effusion. No right pleural effusion. No pneumothorax. There is posterior atelectasis in both lungs. New finding of patchy dense consolidation in the left lower lobe basal segments, focal consolidation in the superior segment of the right lower lobe findings could be due to pneumonia, infarctions or combination. No interstitial edema is seen. Remaining lungs are generally clear. 8 mm left lower lobe nodule noted previously is obscured due to left lung consolidation. Musculoskeletal: No aggressive bone lesion is seen. Review of the MIP images confirms the above findings. CT ABDOMEN and PELVIS FINDINGS Hepatobiliary: There is a small stone in the distal gallbladder. No wall thickening or bile duct dilatation. There is no liver mass enhancement. Pancreas: No abnormality. Spleen: No abnormality. Adrenals/Urinary Tract: Interval left nephroadrenalectomy. There are scattered retroperitoneal air pockets in the operative bed. There is mild low-density fluid settling posteriorly in the left renal fossa, with low-density fluid measuring 7.4 x 2.8 x 12.4 cm.  This is probably a postoperative seroma or residual postoperative fluid. There is no right adrenal or renal mass enhancement. There is a 5 mm nonobstructive stone in the posterior right kidney. No obstructing stone is evident. There is mild bladder thickening versus underdistention. Correlate with urinalysis. Stomach/Bowel: Chronic gastric fold thickening. Likely chronic gastritis. The appendix and bowel are unremarkable. Vascular/Lymphatic: There is a small volume of thrombus in the IVC extending into it from the ligated left renal vein. On the MRI 11/15/2022 there was enhancing tumor thrombus in the left renal vein but not in the IVC. This could be a small tumor thrombus. Also not entirely certain some of the bilateral pulmonary arterial emboli are not tumor thrombus as well. No other significant vascular findings. There previously was left periaortic chain adenopathy but this has been removed. Reproductive: Uterus and bilateral adnexa are unremarkable. Other: There is extensive pneumoperitoneum, but this is almost all anterior. Some of it tracks along the left dorsal retroperitoneal planes but there are no air pockets along side of the bowel. This is probably postoperative free air, with hollow viscous perforation not strictly excluded. There is additional moderately extensive left abdominal wall emphysema, tracking into the groin areas. Some of the intraperitoneal air tracks into the inguinal canals and left hemiscrotum. No abscess is seen. A small amount of free fluid of low-density collects in the posterior right deep pelvis. Additional scattered fluid extends along the left retroperitoneum anterior to the aorta. Musculoskeletal: No aggressive regional  osseous lesions. Review of the MIP images confirms the above findings. IMPRESSION: 1. Bilateral acute arterial emboli with overall moderate clot burden but no findings of acute right heart strain. Possible at least some of the embolic disease could be tumor  thrombus. 2. Mild cardiomegaly with trace calcification in the LAD coronary artery. 3. Small left pleural effusion. 4. Bilateral lower lobe consolidations, left greater than right, could be due to pneumonia, infarctions or combination. 5. Extensive pneumomediastinum, and extensive left abdominal wall emphysema tracking into the inguinal canals and left hemiscrotum. 6. Small volume of thrombus in the IVC extending into it from the ligated left renal vein. On the MRI 11/15/2022 there was enhancing tumor thrombus in the left renal vein but not in the IVC. This could be a small tumor thrombus. 7. Interval left nephroadrenalectomy with low-density fluid settling posteriorly in the left renal fossa measuring 7.4 x 2.8 x 12.4 cm. This is probably a postoperative seroma or residual postoperative fluid. 8. Extensive pneumoperitoneum, almost all of which is anterior. This is probably postoperative free air, with hollow viscus perforation not strictly excluded. 9. Small amount of low-density free fluid in the posterior right deep pelvis. 10. Cholelithiasis. 11. Nonobstructive nephrolithiasis. 12. Aortic atherosclerosis.  Remaining findings described above. 13. Critical Value/emergent results were called by telephone at the time of interpretation on 12/19/2022 at 2:06 am to provider [facility] , who verbally acknowledged these results. Aortic Atherosclerosis (ICD10-I70.0). Electronically Signed   By: Almira Bar M.D.   On: 12/19/2022 02:50   DG Chest Portable 1 View  Result Date: 12/19/2022 CLINICAL DATA:  Shortness of breath EXAM: PORTABLE CHEST 1 VIEW COMPARISON:  12/16/2022 FINDINGS: Continued free air under the right hemi diets crash that continued free air under the hemidiaphragms, likely related to recent surgery. Low lung volumes with bibasilar atelectasis and vascular congestion. Heart and mediastinal contours within normal limits. No visible effusions or pneumothorax. IMPRESSION: Low lung volumes, bibasilar  atelectasis, vascular congestion. Pneumoperitoneum, likely related to recent laparoscopic surgery. Electronically Signed   By: Charlett Nose M.D.   On: 12/19/2022 00:32   DG CHEST PORT 1 VIEW  Result Date: 12/16/2022 CLINICAL DATA:  Chest pain.  Status post left nephrectomy EXAM: PORTABLE CHEST 1 VIEW COMPARISON:  12/09/2022 x-ray FINDINGS: Underinflation with increasing lung base opacities. Atelectasis is favored over infiltrate recommend follow-up. No pneumothorax or effusion. Normal cardiopericardial silhouette. Film is rotated to the left. There is lucency beneath the diaphragm diffusely. Possible free air. Patient is status post nephrectomy. IMPRESSION: Underinflation with increasing lung base opacities. Atelectasis is favored over infiltrate recommend follow-up. Free air seen in the abdomen. This very well could be postoperative in light of the patient's recent laparoscopic nephrectomy. Please correlate with specific history Electronically Signed   By: Karen Kays M.D.   On: 12/16/2022 15:12   CT Super D Chest Wo Contrast  Result Date: 12/13/2022 CLINICAL DATA:  Large left renal mass with left lower lobe pulmonary nodule seen on recent CT scan. EXAM: CT CHEST WITHOUT CONTRAST TECHNIQUE: Multidetector CT imaging of the chest was performed using thin slice collimation for electromagnetic bronchoscopy planning purposes, without intravenous contrast. RADIATION DOSE REDUCTION: This exam was performed according to the departmental dose-optimization program which includes automated exposure control, adjustment of the mA and/or kV according to patient size and/or use of iterative reconstruction technique. COMPARISON:  CT scan 10/15/2022 FINDINGS: Cardiovascular: The heart is normal in size. No pericardial effusion. The aorta is normal in caliber. Minimal calcification. Minimal scattered coronary  artery calcifications. Mediastinum/Nodes: No mediastinal or hilar mass or lymphadenopathy. The esophagus is grossly  normal. Lungs/Pleura: Persistent left lower lobe pulmonary nodule measuring 8 mm on image 84/4. Indeterminate finding but given the large left renal mass certainly suspicious for metastasis. No other pulmonary nodules or lesions are identified except for a benign calcified granuloma in left upper on image number 70/4. The central tracheobronchial tree is unremarkable. No acute pulmonary process. Upper Abdomen: Stable large left renal mass and left adrenal gland lesion and left para-aortic adenopathy. 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 Cholelithiasis. Musculoskeletal: No worrisome bone lesions. IMPRESSION: 1. Persistent 8 mm left lower lobe pulmonary nodule, suspicious for metastasis. 2. No mediastinal or hilar mass or adenopathy. 3. Stable large left renal mass and left adrenal gland lesion and left para-aortic adenopathy. 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 4. Cholelithiasis. Electronically Signed   By: Rudie Meyer M.D.   On: 12/13/2022 11:08   DG Chest Port 1 View  Result Date: 12/09/2022 CLINICAL DATA:  Status post bronchoscopy EXAM: PORTABLE CHEST 1 VIEW COMPARISON:  Chest x-ray dated October 15, 2022 FINDINGS: Cardiac and mediastinal contours are within normal limits. Mild left lower lobe opacities. No evidence of pleural effusion. No evidence of pneumothorax. IMPRESSION: Mild left lower lobe opacities, likely atelectasis or post bronchoscopy changes. No evidence of pneumothorax. Electronically Signed   By: Allegra Lai M.D.   On: 12/09/2022 10:12   DG C-ARM BRONCHOSCOPY  Result Date: 12/09/2022 C-ARM BRONCHOSCOPY: Fluoroscopy was utilized by the requesting physician.  No radiographic interpretation.   ECHOCARDIOGRAM COMPLETE  Result Date: 11/30/2022    ECHOCARDIOGRAM REPORT   Patient Name:   Paul Flowers Date of Exam: 11/30/2022 Medical Rec #:  [id number]      Height:       70.0 in Accession #:    [id number]     Weight:       171.8 lb Date of Birth:  Dec 23, 1970      BSA:           1.957 m Patient Age:    51 years       BP:           124/62 mmHg Patient Gender: M              HR:           105 bpm. Exam Location:  [facility] Procedure: 2D Echo, Cardiac Doppler and Color Doppler Indications:     Dyspnea R06.00                  Shortness of breath and palpitations.  History:         Patient has prior history of Echocardiogram examinations, most                  recent 12/11/2013. Risk Factors:Hypertension and Sleep Apnea.  Sonographer:     Cristela Blue Referring Phys:  [id number] Michaelyn Barter Diagnosing Phys: Yvonne Kendall MD  Sonographer Comments: Technically challenging study due to limited acoustic windows, no parasternal window and no subcostal window. IMPRESSIONS  1. Left ventricular ejection fraction, by estimation, is 50 to 55%. The left ventricle has low normal function. Left ventricular endocardial border not optimally defined to evaluate regional wall motion. Left ventricular diastolic parameters are indeterminate.  2. Right ventricular systolic function is normal. The right ventricular size is normal.  3. The mitral valve is grossly normal. No evidence of mitral valve regurgitation.  4. The aortic valve was not well  visualized. Aortic valve regurgitation is not visualized. No aortic stenosis is present. FINDINGS  Left Ventricle: Left ventricular ejection fraction, by estimation, is 50 to 55%. The left ventricle has low normal function. Left ventricular endocardial border not optimally defined to evaluate regional wall motion. Global longitudinal strain performed  but not reported based on interpreter judgement due to suboptimal tracking. The left ventricular internal cavity size was normal in size. There is no left ventricular hypertrophy. Left ventricular diastolic parameters are indeterminate. Right Ventricle: The right ventricular size is normal. Right vetricular wall thickness was not well visualized. Right ventricular systolic function is normal. Left Atrium: Left atrial size was  normal in size. Right Atrium: Right atrial size was normal in size. Pericardium: The pericardium was not well visualized. Mitral Valve: The mitral valve is grossly normal. No evidence of mitral valve regurgitation. Tricuspid Valve: The tricuspid valve is not well visualized. Tricuspid valve regurgitation is trivial. Aortic Valve: The aortic valve was not well visualized. Aortic valve regurgitation is not visualized. No aortic stenosis is present. Aortic valve mean gradient measures 3.0 mmHg. Aortic valve peak gradient measures 5.2 mmHg. Aortic valve area, by VTI measures 3.00 cm. Pulmonic Valve: The pulmonic valve was not well visualized. Pulmonic valve regurgitation is not visualized. No evidence of pulmonic stenosis. Aorta: The aortic root is normal in size and structure. Pulmonary Artery: The pulmonary artery is not well seen. Venous: The inferior vena cava was not well visualized. IAS/Shunts: The interatrial septum was not well visualized.  LEFT VENTRICLE PLAX 2D LVIDd:         4.60 cm   Diastology LVIDs:         3.30 cm   LV e' medial:    7.72 cm/s LV PW:         1.00 cm   LV E/e' medial:  7.7 LV IVS:        0.90 cm   LV e' lateral:   15.90 cm/s LVOT diam:     2.10 cm   LV E/e' lateral: 3.8 LV SV:         48 LV SV Index:   24 LVOT Area:     3.46 cm  RIGHT VENTRICLE RV Basal diam:  2.30 cm RV Mid diam:    2.00 cm RV S prime:     12.80 cm/s TAPSE (M-mode): 2.2 cm LEFT ATRIUM           Index        RIGHT ATRIUM          Index LA diam:      3.10 cm 1.58 cm/m   RA Area:     8.38 cm LA Vol (A2C): 37.2 ml 19.01 ml/m  RA Volume:   15.60 ml 7.97 ml/m LA Vol (A4C): 16.7 ml 8.53 ml/m  AORTIC VALVE AV Area (Vmax):    2.46 cm AV Area (Vmean):   2.45 cm AV Area (VTI):     3.00 cm AV Vmax:           113.50 cm/s AV Vmean:          82.000 cm/s AV VTI:            0.160 m AV Peak Grad:      5.2 mmHg AV Mean Grad:      3.0 mmHg LVOT Vmax:         80.60 cm/s LVOT Vmean:        57.900 cm/s LVOT VTI:  0.138 m  LVOT/AV VTI ratio: 0.87  AORTA Ao Root diam: 3.30 cm MITRAL VALVE               TRICUSPID VALVE MV Area (PHT): 4.77 cm    TR Peak grad:   20.8 mmHg MV Decel Time: 159 msec    TR Vmax:        228.00 cm/s MV E velocity: 59.70 cm/s MV A velocity: 96.40 cm/s  SHUNTS MV E/A ratio:  0.62        Systemic VTI:  0.14 m                            Systemic Diam: 2.10 cm Yvonne Kendall MD Electronically signed by Yvonne Kendall MD Signature Date/Time: 11/30/2022/4:59:59 PM    Final      Time coordinating discharge: Over 30 minutes    Lewie Chamber, MD  [facility] 12/25/2022, 2:54 PM

## 2022-12-25 NOTE — TOC Transition Note (Signed)
Transition of Care [facility]) - CM/SW Discharge Note   Patient Details  Name: Paul Flowers MRN: [id number] Date of Birth: 18-Nov-1970  [facility] CM/SW Contact:  Adrian Prows, RN Phone Number: 12/25/2022, 10:50 AM   Clinical Narrative:    D/C orders received; no TOC needs.   Final next level of care: Home/Self Care     Patient Goals and CMS Choice      Discharge Placement                         Discharge Plan and Services Additional resources added to the After Visit Summary for                                       Social Determinants of Health (SDOH) Interventions SDOH Screenings   Food Insecurity: No Food Insecurity (12/19/2022)  Housing: Low Risk  (12/19/2022)  Transportation Needs: No Transportation Needs (12/19/2022)  Utilities: Not At Risk (12/19/2022)  Alcohol Screen: Low Risk  (10/14/2022)  Depression (PHQ2-9): Low Risk  (07/19/2022)  Financial Resource Strain: Low Risk  (10/14/2022)  Physical Activity: Insufficiently Active (10/14/2022)  Social Connections: Moderately Integrated (10/14/2022)  Stress: No Stress Concern Present (10/14/2022)  Tobacco Use: Low Risk  (12/23/2022)     Readmission Risk Interventions    12/20/2022   11:59 AM 12/16/2022    9:11 AM  Readmission Risk Prevention Plan  Post Dischage Appt  Complete  Medication Screening  Complete  Transportation Screening Complete Complete  PCP or Specialist Appt within 5-7 Days Complete   Home Care Screening Complete   Medication Review (RN CM) Complete

## 2022-12-25 NOTE — Progress Notes (Signed)
Patient given discharge, medication, and follow up instructions, verbalized understanding< IV x 2 removed, personal belongings with patient, family to transport home

## 2022-12-25 NOTE — Consult Note (Signed)
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 quite high.  Prior to hospitalization it appears that his only medication was losartan 25 mg once daily, will increase this to 50 mg daily.  He is now also receiving metoprolol.  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   Risk Assessment/Risk Scores:            [facility] will sign off.   Medication Recommendations: Eliquis - dose per VTE protocol Other recommendations (labs, testing, etc): Consider alternative assessment of LV function with cardiac MRI as an outpatient.  Will decide at his office visit. Follow up as an outpatient: Will arrange follow-up.  For questions or updates, please contact [facility] Please consult [email or web address] for contact info under    Signed, Thurmon Fair, MD  12/25/2022 8:20 AM

## 2022-12-27 ENCOUNTER — Telehealth: Payer: Self-pay | Admitting: *Deleted

## 2022-12-27 NOTE — Transitions of Care (Post Inpatient/ED Visit) (Signed)
   12/27/2022  Name: Paul Flowers MRN: [id number] DOB: 06-23-1971  Today's TOC FU Call Status: Today's TOC FU Call Status:: Unsuccessul Call (1st Attempt) Unsuccessful Call (1st Attempt) Date: 12/27/22  Attempted to reach the patient regarding the most recent Inpatient/ED visit.  Follow Up Plan: Additional outreach attempts will be made to reach the patient to complete the Transitions of Care (Post Inpatient/ED visit) call.   Gean Maidens BSN RN [facility] [phone]

## 2022-12-28 ENCOUNTER — Telehealth: Payer: Self-pay | Admitting: *Deleted

## 2022-12-28 NOTE — Transitions of Care (Post Inpatient/ED Visit) (Signed)
   12/28/2022  Name: Paul Flowers MRN: [id number] DOB: [birth year]  Today's TOC FU Call Status: Today's TOC FU Call Status:: Unsuccessful Call (2nd Attempt) Unsuccessful Call (2nd Attempt) Date: 12/28/22  Attempted to reach the patient regarding the most recent Inpatient/ED visit.  Follow Up Plan: Additional outreach attempts will be made to reach the patient to complete the Transitions of Care (Post Inpatient/ED visit) call.   Gean Maidens BSN RN [facility] [phone]

## 2022-12-29 ENCOUNTER — Telehealth: Payer: Self-pay | Admitting: *Deleted

## 2022-12-29 DIAGNOSIS — G4733 Obstructive sleep apnea (adult) (pediatric): Secondary | ICD-10-CM | POA: Diagnosis not present

## 2022-12-29 NOTE — Transitions of Care (Post Inpatient/ED Visit) (Signed)
12/29/2022  Name: Paul Flowers MRN: [id number] DOB: 06/26/1971  Today's TOC FU Call Status: Today's TOC FU Call Status:: Successful TOC FU Call Competed TOC FU Call Complete Date: 12/29/22  Transition Care Management Follow-up Telephone Call Date of Discharge: 12/25/22 Discharge Facility: [facility]) Type of Discharge: Inpatient Admission Primary Inpatient Discharge Diagnosis:: Multiple subsegmental pulmonary emboli without acute cor pulmonale How have you been since you were released from the hospital?: Better Any questions or concerns?: Yes Patient Questions/Concerns:: Do i have a lead way when taking Eliquis time frame. RN discussed taking medication Patient Questions/Concerns Addressed: Other:  Items Reviewed: Did you receive and understand the discharge instructions provided?: Yes Medications obtained,verified, and reconciled?: Yes (Medications Reviewed) Any new allergies since your discharge?: No Dietary orders reviewed?: No Do you have support at home?: Yes People in Home: significant other  Medications Reviewed Today: Medications Reviewed Today     Reviewed by Luella Cook, RN (Case Manager) on 12/29/22 at 1054  Med List Status: <None>   Medication Order Taking? Sig Documenting Provider Last Dose Status Informant  apixaban (ELIQUIS) 5 MG TABS tablet [id number] Yes Take 1 tablet (5 mg total) by mouth 2 (two) times daily. Lewie Chamber, MD Taking Active   APIXABAN Everlene Balls) VTE STARTER PACK (10MG  AND 5MG ) [id number] Yes Take as directed on package: start with two-5mg  tablets twice daily for 7 days. On day 8, switch to one-5mg  tablet twice daily. Lewie Chamber, MD Taking Active   atorvastatin (LIPITOR) 20 MG tablet [id number] Yes TAKE 1 TABLET BY MOUTH EVERY DAY Bedsole, Amy E, MD Taking Active Self  docusate sodium (COLACE) 100 MG capsule [id number] Yes Take 1 capsule (100 mg total) by mouth 2 (two) times daily. Harrie Foreman, PA-C Taking Active Self   fluticasone (FLONASE) 50 MCG/ACT nasal spray [id number] Yes Place 1 spray into both nostrils daily. Raechel Chute, MD Taking Active Self  HYDROcodone-acetaminophen (NORCO) 5-325 MG tablet [id number] Yes Take 1-2 tablets by mouth every 6 (six) hours as needed for moderate pain or severe pain. Harrie Foreman, PA-C Taking Active Self  losartan (COZAAR) 50 MG tablet [id number] Yes Take 1 tablet (50 mg total) by mouth daily. Lewie Chamber, MD Taking Active   metoprolol tartrate (LOPRESSOR) 25 MG tablet [id number] Yes Take 1 tablet (25 mg total) by mouth 2 (two) times daily. Lewie Chamber, MD Taking Active   montelukast (SINGULAIR) 10 MG tablet [id number] Yes TAKE 1 TABLET BY MOUTH EVERYDAY AT BEDTIME  Patient taking differently: Take 10 mg by mouth daily. TAKE 1 TABLET BY MOUTH EVERYDAY   Bedsole, Amy E, MD Taking Active Self  omeprazole (PRILOSEC) 20 MG capsule [id number] Yes TAKE 1 CAPSULE BY MOUTH EVERY DAY Bedsole, Amy E, MD Taking Active Self  ONE TOUCH LANCETS MISC [id number] Yes Use to test blood sugar once daily Reather Littler, MD Taking Active Self  sodium chloride (OCEAN) 0.65 % SOLN nasal spray [id number]  Place 1 spray into both nostrils in the morning and at bedtime. Raechel Chute, MD  Active Self           Med Note Gunnar Fusi, MELISSA R   Wed Dec 01, 2022  9:23 AM) Have not picked up from pharmacy   XIGDUO XR 11-998 MG TB24 [id number] Yes TAKE 1 TABLET BY MOUTH EVERY DAY Reather Littler, MD Taking Active Self            Home Care and Equipment/Supplies: Were Home Health Services Ordered?: NA Any new equipment or medical supplies ordered?: NA  Functional Questionnaire: Do you need assistance with bathing/showering or dressing?: No Do you need assistance with meal preparation?: No Do you need assistance with eating?: No Do you have difficulty maintaining continence: No Do you need assistance with getting out of bed/getting out of a chair/moving?: No Do you have difficulty managing or taking  your medications?: No  Follow up appointments reviewed: PCP Follow-up appointment confirmed?: NA Specialist Hospital Follow-up appointment confirmed?: Yes Date of Specialist follow-up appointment?: 12/30/22 Follow-Up Specialty Provider:: [id number] Dr Alena Bills , [id number] Dr Aundria Rud, [id number] Tereso Newcomer PA Do you need transportation to your follow-up appointment?: No Do you understand care options if your condition(s) worsen?: Yes-patient verbalized understanding   Interventions Today    Flowsheet Row Most Recent Value  General Interventions   General Interventions Discussed/Reviewed General Interventions Discussed, General Interventions Reviewed, Doctor Visits  Doctor Visits Discussed/Reviewed Doctor Visits Discussed, Doctor Visits Reviewed  Pharmacy Interventions   Pharmacy Dicussed/Reviewed Pharmacy Topics Discussed, Pharmacy Topics Reviewed      TOC Interventions Today    Flowsheet Row Most Recent Value  TOC Interventions   TOC Interventions Discussed/Reviewed TOC Interventions Discussed, TOC Interventions Reviewed        Gean Maidens BSN RN [facility] [phone]

## 2022-12-30 ENCOUNTER — Inpatient Hospital Stay (HOSPITAL_BASED_OUTPATIENT_CLINIC_OR_DEPARTMENT_OTHER): Payer: Federal, State, Local not specified - PPO | Admitting: Internal Medicine

## 2022-12-30 ENCOUNTER — Encounter: Payer: Self-pay | Admitting: Internal Medicine

## 2022-12-30 VITALS — BP 104/87 | HR 100 | Temp 98.7°F | Resp 16 | Ht 70.0 in | Wt 152.0 lb

## 2022-12-30 DIAGNOSIS — C642 Malignant neoplasm of left kidney, except renal pelvis: Secondary | ICD-10-CM | POA: Insufficient documentation

## 2022-12-30 DIAGNOSIS — D509 Iron deficiency anemia, unspecified: Secondary | ICD-10-CM | POA: Diagnosis not present

## 2022-12-30 DIAGNOSIS — R911 Solitary pulmonary nodule: Secondary | ICD-10-CM

## 2022-12-30 DIAGNOSIS — C649 Malignant neoplasm of unspecified kidney, except renal pelvis: Secondary | ICD-10-CM | POA: Insufficient documentation

## 2022-12-30 DIAGNOSIS — I2694 Multiple subsegmental pulmonary emboli without acute cor pulmonale: Secondary | ICD-10-CM

## 2022-12-30 NOTE — Progress Notes (Addendum)
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       166.0 lb Date of Birth:  1971-02-09      BSA:          1.928 m Patient Age:    51 years       BP:           138/94 mmHg Patient Gender: M              HR:           98 bpm. Exam Location:  Inpatient Procedure: 2D Echo, 3D Echo, Cardiac Doppler and Color Doppler Indications:    I26.02 Pulmonary embolus  History:        Patient has prior history of Echocardiogram examinations, most                 recent 11/30/2022. Abnormal ECG, Arrythmias:Tachycardia,                 Signs/Symptoms:Dyspnea; Risk Factors:Sleep Apnea, Diabetes,                 Hypertension and Dyslipidemia. Pulmonary embolus.  Sonographer:    Sheralyn Boatman RDCS Referring Phys: [phone] DAVID GIRGUIS  Sonographer Comments: Technically difficult study due to poor echo windows and suboptimal parasternal window. Patient began to vomit during subcostals. Exam ended due to patient vomiting. IMPRESSIONS  1. Left ventricular ejection fraction, by estimation, is 40 to 45%. The left ventricle has mildly decreased function. The  left ventricle demonstrates global hypokinesis. Left ventricular diastolic parameters are consistent with Grade I diastolic dysfunction (impaired relaxation).  2. Right ventricular systolic function is normal. The right ventricular size is normal. Tricuspid regurgitation signal is inadequate for assessing PA pressure.  3. The mitral valve is normal in structure. Trivial mitral valve regurgitation. No evidence of mitral stenosis.  4. The aortic valve is normal in structure. Aortic valve regurgitation is trivial. No aortic stenosis is present. FINDINGS  Left Ventricle: Left ventricular ejection fraction, by estimation, is 40 to 45%. The left ventricle has mildly decreased function. The left ventricle demonstrates global hypokinesis. The left ventricular internal cavity size was normal in size. There is  no left ventricular hypertrophy. Left ventricular diastolic parameters are consistent with Grade I diastolic dysfunction (impaired relaxation). Normal left ventricular filling pressure. Right Ventricle: The right ventricular size is normal. No increase in right ventricular wall thickness. Right ventricular systolic function is normal. Tricuspid regurgitation signal is inadequate for assessing PA pressure. Left Atrium: Left atrial size was normal in size. Right Atrium: Right atrial size was normal in size. Pericardium: There is no evidence of pericardial effusion. Mitral Valve: The mitral valve is normal in structure. Trivial mitral valve regurgitation. No evidence of mitral valve stenosis. Tricuspid  Valve: The tricuspid valve is normal in structure. Tricuspid valve regurgitation is trivial. No evidence of tricuspid stenosis. Aortic Valve: The aortic valve is normal in structure. Aortic valve regurgitation is trivial. No aortic stenosis is present. Pulmonic Valve: The pulmonic valve was not well visualized. Pulmonic valve regurgitation is not visualized. No evidence of pulmonic stenosis. Aorta: The aortic root is normal  in size and structure. Venous: The inferior vena cava was not well visualized. IAS/Shunts: No atrial level shunt detected by color flow Doppler.  LEFT VENTRICLE PLAX 2D LVIDd:         4.10 cm      Diastology LVIDs:         3.30 cm      LV e' medial:    9.25 cm/s LV PW:         1.00 cm      LV E/e' medial:  6.5 LV IVS:        1.00 cm      LV e' lateral:   10.90 cm/s LVOT diam:     2.20 cm      LV E/e' lateral: 5.5 LV SV:         58 LV SV Index:   30 LVOT Area:     3.80 cm                              3D Volume EF: LV Volumes (MOD)            3D EF:        39 % LV vol d, MOD A2C: 96.9 ml  LV EDV:       137 ml LV vol d, MOD A4C: 104.0 ml LV ESV:       83 ml LV vol s, MOD A2C: 55.4 ml  LV SV:        54 ml LV vol s, MOD A4C: 59.2 ml LV SV MOD A2C:     41.5 ml LV SV MOD A4C:     104.0 ml LV SV MOD BP:      44.7 ml RIGHT VENTRICLE RV S prime:     6.74 cm/s TAPSE (M-mode): 1.1 cm LEFT ATRIUM           Index        RIGHT ATRIUM          Index LA diam:      3.40 cm 1.76 cm/m   RA Area:     7.97 cm LA Vol (A2C): 22.2 ml 11.51 ml/m  RA Volume:   11.00 ml 5.70 ml/m LA Vol (A4C): 23.3 ml 12.08 ml/m  AORTIC VALVE LVOT Vmax:   90.80 cm/s LVOT Vmean:  62.900 cm/s LVOT VTI:    0.152 m  AORTA Ao Root diam: 3.20 cm Ao Asc diam:  3.30 cm MITRAL VALVE MV Area (PHT): 4.82 cm    SHUNTS MV Decel Time: 157 msec    Systemic VTI:  0.15 m MV E velocity: 59.90 cm/s  Systemic Diam: 2.20 cm MV A velocity: 83.80 cm/s MV E/A ratio:  0.71 Armanda Magic MD Electronically signed by Armanda Magic MD Signature Date/Time: 12/22/2022/2:37:07 PM    Final    VAS [place] LOWER EXTREMITY VENOUS (DVT)  Result Date: 12/20/2022  Lower Venous DVT Study Patient Name:  Paul Flowers  Date of Exam:   12/19/2022 Medical Rec #: [id number]       Accession #:  [id number] Date of Birth: [birth year]       Patient Gender: M Patient Age:   65 years Exam Location:  [facility] Procedure:      VAS [place] LOWER EXTREMITY VENOUS (DVT) Referring Phys: TIMOTHY OPYD  --------------------------------------------------------------------------------  Indications: Pulmonary embolism. 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 Comparison Study: No prior studies. Performing Technologist: Jean Rosenthal RDMS, RVT  Examination Guidelines: A complete evaluation includes B-mode imaging, spectral Doppler, color Doppler, and power Doppler as needed of all accessible portions of each vessel. Bilateral testing is considered an integral part of a complete examination. Limited examinations for reoccurring indications may be performed as noted. The reflux portion of the exam is performed with the patient in reverse Trendelenburg.  +---------+---------------+---------+-----------+----------+--------------+ RIGHT    CompressibilityPhasicitySpontaneityPropertiesThrombus Aging +---------+---------------+---------+-----------+----------+--------------+ CFV      Full           Yes                                          +---------+---------------+---------+-----------+----------+--------------+ SFJ      Full                                                        +---------+---------------+---------+-----------+----------+--------------+ FV Prox  Full                                                        +---------+---------------+---------+-----------+----------+--------------+ FV Mid   Full                                                        +---------+---------------+---------+-----------+----------+--------------+ FV DistalFull           Yes      Yes                                 +---------+---------------+---------+-----------+----------+--------------+ PFV      Full                                                        +---------+---------------+---------+-----------+----------+--------------+ POP      Full           Yes      Yes                                  +---------+---------------+---------+-----------+----------+--------------+ PTV      Full                                                        +---------+---------------+---------+-----------+----------+--------------+  PERO     Full                                                        +---------+---------------+---------+-----------+----------+--------------+   +---------+---------------+---------+-----------+----------+--------------+ LEFT     CompressibilityPhasicitySpontaneityPropertiesThrombus Aging +---------+---------------+---------+-----------+----------+--------------+ CFV      Full           Yes      Yes                                 +---------+---------------+---------+-----------+----------+--------------+ SFJ      Full                                                        +---------+---------------+---------+-----------+----------+--------------+ FV Prox  Full                                                        +---------+---------------+---------+-----------+----------+--------------+ FV Mid   Full                                                        +---------+---------------+---------+-----------+----------+--------------+ FV DistalFull           Yes      Yes                                 +---------+---------------+---------+-----------+----------+--------------+ PFV      Full                                                        +---------+---------------+---------+-----------+----------+--------------+ POP      Full           Yes      Yes                                 +---------+---------------+---------+-----------+----------+--------------+ PTV      Full                                                        +---------+---------------+---------+-----------+----------+--------------+ PERO     Full                                                         +---------+---------------+---------+-----------+----------+--------------+  Summary: RIGHT: - There is no evidence of deep vein thrombosis in the lower extremity.  - No cystic structure found in the popliteal fossa.  LEFT: - There is no evidence of deep vein thrombosis in the lower extremity.  - No cystic structure found in the popliteal fossa.  *See table(s) above for measurements and observations. Electronically signed by Sherald Hess MD on 12/20/2022 at 9:24:00 AM.    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 Bronchial biopsy procedure also was done on 12/09/2022. Pulmonary embolism suspected, high probability. Abdominal pain also. EXAM: CT ANGIOGRAPHY CHEST CT ABDOMEN AND PELVIS WITH CONTRAST TECHNIQUE: Multidetector CT imaging of the chest was performed using the standard protocol during bolus administration of intravenous contrast. Multiplanar CT image reconstructions and MIPs were obtained to evaluate the vascular anatomy. Multidetector CT imaging of the abdomen and pelvis was performed using the standard protocol during bolus administration of intravenous contrast. RADIATION DOSE REDUCTION: This exam was performed according to the departmental dose-optimization program which includes automated exposure control, adjustment of the mA and/or kV according to patient size and/or use of iterative reconstruction technique. CONTRAST:  OMNIPAQUE IOHEXOL 350 MG/ML SOLN COMPARISON:  Portable chest today, portable chest 12/16/2022 and 12/09/2022, chest CT without contrast 12/07/2022, CTA chest 10/15/2022, MRI abdomen 11/15/2022 and CT abdomen and pelvis with contrast 10/28/2022. FINDINGS: CTA CHEST FINDINGS Cardiovascular: The pulmonary trunk is upper limit of normal in caliber at 2.8 cm, was previously 2.3 cm. There are bilateral acute arterial emboli, overall  moderate clot burden. There is no increase in the RV/LV ratio and no significant IVC reflux pattern. No acute right heart strain is suspected. On the left, several subsegmental emboli are noted in the lingula and apical segments. There is thrombus in the distal left lower lobe main artery extending into all of the segmental arteries with nonocclusive thrombus, and in a few scattered downstream subsegmental arteries where there is probably occlusive thrombus in the some of the basal subsegments. On the right, there are scattered subsegmental arterial partially occlusive emboli in the anterior and apical segments. There are a few subsegmental emboli in the lateral and medial right middle lobe segments some of which appear occlusive. There are additional scattered subsegmental emboli in the basal segments of the lower lobe and in the superior segment. There is no pericardial effusion. There is mild cardiomegaly with a left chamber predominance, interval increased. There is trace calcification in the LAD coronary artery. Thoracic aorta is tortuous, minimal calcific plaque in the distal arch is seen without aneurysm, stenosis or dissection. The great vessels are clear.  Pulmonary veins are nondistended. Mediastinum/Nodes: There is extensive pneumomediastinum which is primarily anterior, some of this extending into the right paratracheal and left-to-right lateral prevascular spaces. This probably tracked up from the abdomen where there is free intraperitoneal air presumably from the recent surgery. Esophageal thickness is normal. None of the air is seen around the esophagus. The trachea and main bronchi are clear. Unremarkable thyroid gland. No axillary or intrathoracic adenopathy. Lungs/Pleura: Small layering left pleural effusion. No right pleural effusion. No pneumothorax. There is posterior atelectasis in both lungs. New finding of patchy dense consolidation in the left lower lobe basal segments, focal consolidation  in the superior segment of the right lower lobe findings could be due to pneumonia, infarctions or combination. No interstitial edema is seen. Remaining lungs are generally  clear. 8 mm left lower lobe nodule noted previously is obscured due to left lung consolidation. Musculoskeletal: No aggressive bone lesion is seen. Review of the MIP images confirms the above findings. CT ABDOMEN and PELVIS FINDINGS Hepatobiliary: There is a small stone in the distal gallbladder. No wall thickening or bile duct dilatation. There is no liver mass enhancement. Pancreas: No abnormality. Spleen: No abnormality. Adrenals/Urinary Tract: Interval left nephroadrenalectomy. There are scattered retroperitoneal air pockets in the operative bed. There is mild low-density fluid settling posteriorly in the left renal fossa, with low-density fluid measuring 7.4 x 2.8 x 12.4 cm. This is probably a postoperative seroma or residual postoperative fluid. There is no right adrenal or renal mass enhancement. There is a 5 mm nonobstructive stone in the posterior right kidney. No obstructing stone is evident. There is mild bladder thickening versus underdistention. Correlate with urinalysis. Stomach/Bowel: Chronic gastric fold thickening. Likely chronic gastritis. The appendix and bowel are unremarkable. Vascular/Lymphatic: There is a small volume of thrombus in the IVC extending into it from the ligated left renal vein. On the MRI 11/15/2022 there was enhancing tumor thrombus in the left renal vein but not in the IVC. This could be a small tumor thrombus. Also not entirely certain some of the bilateral pulmonary arterial emboli are not tumor thrombus as well. No other significant vascular findings. There previously was left periaortic chain adenopathy but this has been removed. Reproductive: Uterus and bilateral adnexa are unremarkable. Other: There is extensive pneumoperitoneum, but this is almost all anterior. Some of it tracks along the left dorsal  retroperitoneal planes but there are no air pockets along side of the bowel. This is probably postoperative free air, with hollow viscous perforation not strictly excluded. There is additional moderately extensive left abdominal wall emphysema, tracking into the groin areas. Some of the intraperitoneal air tracks into the inguinal canals and left hemiscrotum. No abscess is seen. A small amount of free fluid of low-density collects in the posterior right deep pelvis. Additional scattered fluid extends along the left retroperitoneum anterior to the aorta. Musculoskeletal: No aggressive regional osseous lesions. Review of the MIP images confirms the above findings. IMPRESSION: 1. Bilateral acute arterial emboli with overall moderate clot burden but no findings of acute right heart strain. Possible at least some of the embolic disease could be tumor thrombus. 2. Mild cardiomegaly with trace calcification in the LAD coronary artery. 3. Small left pleural effusion. 4. Bilateral lower lobe consolidations, left greater than right, could be due to pneumonia, infarctions or combination. 5. Extensive pneumomediastinum, and extensive left abdominal wall emphysema tracking into the inguinal canals and left hemiscrotum. 6. Small volume of thrombus in the IVC extending into it from the ligated left renal vein. On the MRI 11/15/2022 there was enhancing tumor thrombus in the left renal vein but not in the IVC. This could be a small tumor thrombus. 7. Interval left nephroadrenalectomy with low-density fluid settling posteriorly in the left renal fossa measuring 7.4 x 2.8 x 12.4 cm. This is probably a postoperative seroma or residual postoperative fluid. 8. Extensive pneumoperitoneum, almost all of which is anterior. This is probably postoperative free air, with hollow viscus perforation not strictly excluded. 9. Small amount of low-density free fluid in the posterior right deep pelvis. 10. Cholelithiasis. 11. Nonobstructive  nephrolithiasis. 12. Aortic atherosclerosis.  Remaining findings described above. 13. Critical Value/emergent results were called by telephone at the time of interpretation on 12/19/2022 at 2:06 am to provider [facility] , who verbally acknowledged these  results. Aortic Atherosclerosis (ICD10-I70.0). Electronically Signed   By: Almira Bar M.D.   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 Bronchial biopsy procedure also was done on 12/09/2022. Pulmonary embolism suspected, high probability. Abdominal pain also. EXAM: CT ANGIOGRAPHY CHEST CT ABDOMEN AND PELVIS WITH CONTRAST TECHNIQUE: Multidetector CT imaging of the chest was performed using the standard protocol during bolus administration of intravenous contrast. Multiplanar CT image reconstructions and MIPs were obtained to evaluate the vascular anatomy. Multidetector CT imaging of the abdomen and pelvis was performed using the standard protocol during bolus administration of intravenous contrast. RADIATION DOSE REDUCTION: This exam was performed according to the departmental dose-optimization program which includes automated exposure control, adjustment of the mA and/or kV according to patient size and/or use of iterative reconstruction technique. CONTRAST:  OMNIPAQUE IOHEXOL 350 MG/ML SOLN COMPARISON:  Portable chest today, portable chest 12/16/2022 and 12/09/2022, chest CT without contrast 12/07/2022, CTA chest 10/15/2022, MRI abdomen 11/15/2022 and CT abdomen and pelvis with contrast 10/28/2022. FINDINGS: CTA CHEST FINDINGS Cardiovascular: The pulmonary trunk is upper limit of normal in caliber at 2.8 cm, was previously 2.3 cm. There are bilateral acute arterial emboli, overall moderate clot burden. There is no increase in the RV/LV ratio and no significant IVC reflux pattern. No acute right heart strain  is suspected. On the left, several subsegmental emboli are noted in the lingula and apical segments. There is thrombus in the distal left lower lobe main artery extending into all of the segmental arteries with nonocclusive thrombus, and in a few scattered downstream subsegmental arteries where there is probably occlusive thrombus in the some of the basal subsegments. On the right, there are scattered subsegmental arterial partially occlusive emboli in the anterior and apical segments. There are a few subsegmental emboli in the lateral and medial right middle lobe segments some of which appear occlusive. There are additional scattered subsegmental emboli in the basal segments of the lower lobe and in the superior segment. There is no pericardial effusion. There is mild cardiomegaly with a left chamber predominance, interval increased. There is trace calcification in the LAD coronary artery. Thoracic aorta is tortuous, minimal calcific plaque in the distal arch is seen without aneurysm, stenosis or dissection. The great vessels are clear.  Pulmonary veins are nondistended. Mediastinum/Nodes: There is extensive pneumomediastinum which is primarily anterior, some of this extending into the right paratracheal and left-to-right lateral prevascular spaces. This probably tracked up from the abdomen where there is free intraperitoneal air presumably from the recent surgery. Esophageal thickness is normal. None of the air is seen around the esophagus. The trachea and main bronchi are clear. Unremarkable thyroid gland. No axillary or intrathoracic adenopathy. Lungs/Pleura: Small layering left pleural effusion. No right pleural effusion. No pneumothorax. There is posterior atelectasis in both lungs. New finding of patchy dense consolidation in the left lower lobe basal segments, focal consolidation in the superior segment of the right lower lobe findings could be due to pneumonia, infarctions or combination. No interstitial  edema is seen. Remaining lungs are generally clear. 8 mm left lower lobe nodule noted previously is obscured due to left lung consolidation. Musculoskeletal: No aggressive bone lesion is seen. Review of the MIP images confirms the above findings. CT ABDOMEN and PELVIS FINDINGS Hepatobiliary: There is a small stone in the distal gallbladder. No wall thickening or bile duct dilatation. There is no liver mass  enhancement. Pancreas: No abnormality. Spleen: No abnormality. Adrenals/Urinary Tract: Interval left nephroadrenalectomy. There are scattered retroperitoneal air pockets in the operative bed. There is mild low-density fluid settling posteriorly in the left renal fossa, with low-density fluid measuring 7.4 x 2.8 x 12.4 cm. This is probably a postoperative seroma or residual postoperative fluid. There is no right adrenal or renal mass enhancement. There is a 5 mm nonobstructive stone in the posterior right kidney. No obstructing stone is evident. There is mild bladder thickening versus underdistention. Correlate with urinalysis. Stomach/Bowel: Chronic gastric fold thickening. Likely chronic gastritis. The appendix and bowel are unremarkable. Vascular/Lymphatic: There is a small volume of thrombus in the IVC extending into it from the ligated left renal vein. On the MRI 11/15/2022 there was enhancing tumor thrombus in the left renal vein but not in the IVC. This could be a small tumor thrombus. Also not entirely certain some of the bilateral pulmonary arterial emboli are not tumor thrombus as well. No other significant vascular findings. There previously was left periaortic chain adenopathy but this has been removed. Reproductive: Uterus and bilateral adnexa are unremarkable. Other: There is extensive pneumoperitoneum, but this is almost all anterior. Some of it tracks along the left dorsal retroperitoneal planes but there are no air pockets along side of the bowel. This is probably postoperative free air, with  hollow viscous perforation not strictly excluded. There is additional moderately extensive left abdominal wall emphysema, tracking into the groin areas. Some of the intraperitoneal air tracks into the inguinal canals and left hemiscrotum. No abscess is seen. A small amount of free fluid of low-density collects in the posterior right deep pelvis. Additional scattered fluid extends along the left retroperitoneum anterior to the aorta. Musculoskeletal: No aggressive regional osseous lesions. Review of the MIP images confirms the above findings. IMPRESSION: 1. Bilateral acute arterial emboli with overall moderate clot burden but no findings of acute right heart strain. Possible at least some of the embolic disease could be tumor thrombus. 2. Mild cardiomegaly with trace calcification in the LAD coronary artery. 3. Small left pleural effusion. 4. Bilateral lower lobe consolidations, left greater than right, could be due to pneumonia, infarctions or combination. 5. Extensive pneumomediastinum, and extensive left abdominal wall emphysema tracking into the inguinal canals and left hemiscrotum. 6. Small volume of thrombus in the IVC extending into it from the ligated left renal vein. On the MRI 11/15/2022 there was enhancing tumor thrombus in the left renal vein but not in the IVC. This could be a small tumor thrombus. 7. Interval left nephroadrenalectomy with low-density fluid settling posteriorly in the left renal fossa measuring 7.4 x 2.8 x 12.4 cm. This is probably a postoperative seroma or residual postoperative fluid. 8. Extensive pneumoperitoneum, almost all of which is anterior. This is probably postoperative free air, with hollow viscus perforation not strictly excluded. 9. Small amount of low-density free fluid in the posterior right deep pelvis. 10. Cholelithiasis. 11. Nonobstructive nephrolithiasis. 12. Aortic atherosclerosis.  Remaining findings described above. 13. Critical Value/emergent results were called by  telephone at the time of interpretation on 12/19/2022 at 2:06 am to provider [facility] , who verbally acknowledged these results. Aortic Atherosclerosis (ICD10-I70.0). Electronically Signed   By: Almira Bar M.D.   On: 12/19/2022 02:50   DG Chest Portable 1 View  Result Date: 12/19/2022 CLINICAL DATA:  Shortness of breath EXAM: PORTABLE CHEST 1 VIEW COMPARISON:  12/16/2022 FINDINGS: Continued free air under the right hemi diets crash that continued free air under the  hemidiaphragms, likely related to recent surgery. Low lung volumes with bibasilar atelectasis and vascular congestion. Heart and mediastinal contours within normal limits. No visible effusions or pneumothorax. IMPRESSION: Low lung volumes, bibasilar atelectasis, vascular congestion. Pneumoperitoneum, likely related to recent laparoscopic surgery. Electronically Signed   By: Charlett Nose M.D.   On: 12/19/2022 00:32   DG CHEST PORT 1 VIEW  Result Date: 12/16/2022 CLINICAL DATA:  Chest pain.  Status post left nephrectomy EXAM: PORTABLE CHEST 1 VIEW COMPARISON:  12/09/2022 x-ray FINDINGS: Underinflation with increasing lung base opacities. Atelectasis is favored over infiltrate recommend follow-up. No pneumothorax or effusion. Normal cardiopericardial silhouette. Film is rotated to the left. There is lucency beneath the diaphragm diffusely. Possible free air. Patient is status post nephrectomy. IMPRESSION: Underinflation with increasing lung base opacities. Atelectasis is favored over infiltrate recommend follow-up. Free air seen in the abdomen. This very well could be postoperative in light of the patient's recent laparoscopic nephrectomy. Please correlate with specific history Electronically Signed   By: Karen Kays M.D.   On: 12/16/2022 15:12   CT Super D Chest Wo Contrast  Result Date: 12/13/2022 CLINICAL DATA:  Large left renal mass with left lower lobe pulmonary nodule seen on recent CT scan. EXAM: CT CHEST WITHOUT CONTRAST  TECHNIQUE: Multidetector CT imaging of the chest was performed using thin slice collimation for electromagnetic bronchoscopy planning purposes, without intravenous contrast. RADIATION DOSE REDUCTION: This exam was performed according to the departmental dose-optimization program which includes automated exposure control, adjustment of the mA and/or kV according to patient size and/or use of iterative reconstruction technique. COMPARISON:  CT scan 10/15/2022 FINDINGS: Cardiovascular: The heart is normal in size. No pericardial effusion. The aorta is normal in caliber. Minimal calcification. Minimal scattered coronary artery calcifications. Mediastinum/Nodes: No mediastinal or hilar mass or lymphadenopathy. The esophagus is grossly normal. Lungs/Pleura: Persistent left lower lobe pulmonary nodule measuring 8 mm on image 84/4. Indeterminate finding but given the large left renal mass certainly suspicious for metastasis. No other pulmonary nodules or lesions are identified except for a benign calcified granuloma in left upper on image number 70/4. The central tracheobronchial tree is unremarkable. No acute pulmonary process. Upper Abdomen: Stable large left renal mass and left adrenal gland lesion and left para-aortic adenopathy. 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 Cholelithiasis. Musculoskeletal: No worrisome bone lesions. IMPRESSION: 1. Persistent 8 mm left lower lobe pulmonary nodule, suspicious for metastasis. 2. No mediastinal or hilar mass or adenopathy. 3. Stable large left renal mass and left adrenal gland lesion and left para-aortic adenopathy. 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 4. Cholelithiasis. Electronically Signed   By: Rudie Meyer M.D.   On: 12/13/2022 11:08   DG Chest Port 1 View  Result Date: 12/09/2022 CLINICAL DATA:  Status post bronchoscopy EXAM: PORTABLE CHEST 1 VIEW COMPARISON:  Chest x-ray dated October 15, 2022 FINDINGS: Cardiac and mediastinal contours are within normal  limits. Mild left lower lobe opacities. No evidence of pleural effusion. No evidence of pneumothorax. IMPRESSION: Mild left lower lobe opacities, likely atelectasis or post bronchoscopy changes. No evidence of pneumothorax. Electronically Signed   By: Allegra Lai M.D.   On: 12/09/2022 10:12   DG C-ARM BRONCHOSCOPY  Result Date: 12/09/2022 C-ARM BRONCHOSCOPY: Fluoroscopy was utilized by the requesting physician.  No radiographic interpretation.

## 2022-12-30 NOTE — Progress Notes (Signed)
START ON PATHWAY REGIMEN - Renal Cell     A cycle is every 21 days:     Pembrolizumab   **Always confirm dose/schedule in your pharmacy ordering system**  Patient Characteristics: Postoperative without Neoadjuvant Therapy, M0 (Pathologic Staging), Stage I, II, III, or Resected T4M0 Stage IV, High Risk Therapeutic Status: Postoperative without Neoadjuvant Therapy, M0 (Pathologic Staging) AJCC M Category: cM0 AJCC 8 Stage Grouping: IV AJCC T Category: pT4 AJCC N Category: pN1 Risk Status: High Risk Intent of Therapy: Curative Intent, Discussed with Patient

## 2022-12-31 ENCOUNTER — Encounter: Payer: Self-pay | Admitting: Student in an Organized Health Care Education/Training Program

## 2022-12-31 ENCOUNTER — Ambulatory Visit
Payer: Federal, State, Local not specified - PPO | Admitting: Student in an Organized Health Care Education/Training Program

## 2022-12-31 VITALS — BP 110/70 | HR 102 | Temp 97.9°F | Ht 70.0 in | Wt 152.2 lb

## 2022-12-31 DIAGNOSIS — R911 Solitary pulmonary nodule: Secondary | ICD-10-CM | POA: Diagnosis not present

## 2022-12-31 DIAGNOSIS — C642 Malignant neoplasm of left kidney, except renal pelvis: Secondary | ICD-10-CM

## 2022-12-31 DIAGNOSIS — I2694 Multiple subsegmental pulmonary emboli without acute cor pulmonale: Secondary | ICD-10-CM

## 2022-12-31 DIAGNOSIS — R053 Chronic cough: Secondary | ICD-10-CM

## 2022-12-31 LAB — PROTHROMBIN GENE MUTATION
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
## 2023-01-01 ENCOUNTER — Other Ambulatory Visit: Payer: Self-pay

## 2023-01-03 ENCOUNTER — Telehealth: Payer: Self-pay | Admitting: *Deleted

## 2023-01-03 NOTE — Telephone Encounter (Signed)
Pt given appt for port placement on Mon 7/8 at 11a. Instructed to arrive at 10am at the heart and vascular entrance. Pt verbalized understanding and confirmed appt.

## 2023-01-04 ENCOUNTER — Inpatient Hospital Stay: Payer: Federal, State, Local not specified - PPO | Attending: Internal Medicine

## 2023-01-04 DIAGNOSIS — Z79620 Long term (current) use of immunosuppressive biologic: Secondary | ICD-10-CM | POA: Insufficient documentation

## 2023-01-04 DIAGNOSIS — Z5112 Encounter for antineoplastic immunotherapy: Secondary | ICD-10-CM | POA: Insufficient documentation

## 2023-01-04 DIAGNOSIS — C642 Malignant neoplasm of left kidney, except renal pelvis: Secondary | ICD-10-CM | POA: Insufficient documentation

## 2023-01-04 DIAGNOSIS — J9811 Atelectasis: Secondary | ICD-10-CM | POA: Insufficient documentation

## 2023-01-04 DIAGNOSIS — I2699 Other pulmonary embolism without acute cor pulmonale: Secondary | ICD-10-CM | POA: Insufficient documentation

## 2023-01-04 DIAGNOSIS — D509 Iron deficiency anemia, unspecified: Secondary | ICD-10-CM | POA: Insufficient documentation

## 2023-01-04 DIAGNOSIS — E876 Hypokalemia: Secondary | ICD-10-CM | POA: Insufficient documentation

## 2023-01-05 LAB — FACTOR 5 LEIDEN

## 2023-01-05 NOTE — Progress Notes (Signed)
Patient for IR Port Insertion on Mon 01/10/2023, I called and LVM for the patient on the phone and gave pre-procedure instructions. VM made pt aware to be here at 10a, NPO after MN prior to procedure as well as driver post procedure/recovery/discharge.  Called 01/05/2023

## 2023-01-06 ENCOUNTER — Encounter: Payer: Self-pay | Admitting: Physician Assistant

## 2023-01-06 DIAGNOSIS — I519 Heart disease, unspecified: Secondary | ICD-10-CM

## 2023-01-06 NOTE — Progress Notes (Signed)
Cardiology Office Note:    Date:  01/07/2023  ID:  LABYRON NOHL, DOB [birth year], MRN [id number] PCP: Excell Seltzer, MD  [facility] Providers Cardiologist:  Thurmon Fair, MD       Patient Profile:      Cardiomyopathy with mild LV dysfunction Cardiac catheterization 12/11/13: no CAD  TTE 11/30/22: EF 50-55 Chest CTA 12/2022: trace LAD Ca2+ TTE 12/21/22: EF 40-45, global HK, Gr 1 DD, NL RVSF, trivial MR, trivial AI  Reviewed by Dr. Royann Shivers >> no ? between 5/24 and 6/24; EF ~ 50 Diabetes mellitus  Hypertension  OSA GERD Aortic atherosclerosis  Renal CA  S/p L nephrectomy C/b multiple subsegmental pulmonary emboli>>Eliquis  No R heart strain Hx of pulmonary embolism 12/2022 Anemia       History of Present Illness:   Paul Flowers is a 52 y.o. male who returns for post hospital follow up. He was admitted 6/15-6/22 for elective L nephrectomy due to renal mass. Hospital course was c/b acute PE and possible pneumonia. He was evaluated by Dr. Royann Shivers with Cardiology b/c an abnormal echocardiogram. His EF was read out as 40-45. This was compared to an echocardiogram done in 11/2022. It was felt that there was no significant change. He has mild LV dysfunction with EF ~ 50. No further testing was recommended. He is here alone. He notes significant fatigue, nausea/loss of appetite and cold intolerance. He has not had chest pain, significant shortness of breath, orthopnea, leg edema, syncope. HRs at home are usually 90s. He has not had Metoprolol yet today.  ROS See HPI    Studies Reviewed:        Risk Assessment/Calculations:           Physical Exam:   VS:  BP 114/70   Pulse (!) 111   Ht 5\' 10"  (1.778 m)   Wt 148 lb 12.8 oz (67.5 kg)   SpO2 99%   BMI 21.35 kg/m    Wt Readings from Last 3 Encounters:  01/07/23 148 lb 12.8 oz (67.5 kg)  12/31/22 152 lb 3.2 oz (69 kg)  12/30/22 152 lb (68.9 kg)    Constitutional:      Appearance: Not in distress.  Pulmonary:     Breath  sounds: Normal breath sounds. No wheezing. No rales.  Cardiovascular:     Tachycardia present. Regular rhythm.     Murmurs: There is no murmur.  Edema:    Peripheral edema absent.  Skin:    General: Skin is warm and dry.      ASSESSMENT AND PLAN:   Left ventricular systolic dysfunction (LVSD) He is felt to have mild LV dysfunction with an EF of 50%. He is essentially AHA Stage B. We discussed the findings on his echocardiogram today. NYHA II. Volume status stable. His BP is not likely to tolerate the addition of ACE/ARB/ARNI or MRA at this time. I do not think these are indicated at this point as well. I recommend a follow up limited echocardiogram in 8 weeks. If his EF is lower, we can try to adjust GDMT. He is getting ready to start Immunotherapy soon. He may not be able to tolerate much. He is already on [place] in the form of Xigduo.  Continue Farxiga and continue metoprolol tartrate 25 mg twice daily.  Limited echocardiogram in 8 weeks Follow up in 6 mos (or sooner if EF lower)  Hypertension associated with diabetes (HCC) BP controlled. Continue Metoprolol tartrate 25 mg twice daily. HR is  high today but he has not had his meds yet. I suspect his sinus tachycardia is physiologic and related to recent PE, cancer, anemia, deconditioning.   Renal cell cancer (HCC) Continue follow up with oncology. Immunotherapy is to start soon.     Dispo:  Return in about 6 months (around 07/10/2023) for Routine Follow Up w/ Dr. Royann Shivers.  Signed, Tereso Newcomer, PA-C

## 2023-01-07 ENCOUNTER — Encounter: Payer: Self-pay | Admitting: Physician Assistant

## 2023-01-07 ENCOUNTER — Ambulatory Visit: Payer: Federal, State, Local not specified - PPO | Attending: Physician Assistant | Admitting: Physician Assistant

## 2023-01-07 VITALS — BP 114/70 | HR 111 | Ht 70.0 in | Wt 148.8 lb

## 2023-01-07 DIAGNOSIS — E1159 Type 2 diabetes mellitus with other circulatory complications: Secondary | ICD-10-CM

## 2023-01-07 DIAGNOSIS — C649 Malignant neoplasm of unspecified kidney, except renal pelvis: Secondary | ICD-10-CM | POA: Diagnosis not present

## 2023-01-07 DIAGNOSIS — I519 Heart disease, unspecified: Secondary | ICD-10-CM

## 2023-01-07 DIAGNOSIS — I152 Hypertension secondary to endocrine disorders: Secondary | ICD-10-CM | POA: Diagnosis not present

## 2023-01-07 NOTE — Assessment & Plan Note (Signed)
BP controlled. Continue Metoprolol tartrate 25 mg twice daily. HR is high today but he has not had his meds yet. I suspect his sinus tachycardia is physiologic and related to recent PE, cancer, anemia, deconditioning.

## 2023-01-07 NOTE — Patient Instructions (Signed)
Medication Instructions:  Your physician recommends that you continue on your current medications as directed. Please refer to the Current Medication list given to you today.  *If you need a refill on your cardiac medications before your next appointment, please call your pharmacy*  Lab Work: None ordered today.  Testing/Procedures: Your physician has requested that you have a limited echocardiogram in 8 weeks. Echocardiography is a painless test that uses sound waves to create images of your heart. It provides your doctor with information about the size and shape of your heart and how well your heart's chambers and valves are working. This procedure takes approximately one hour. There are no restrictions for this procedure. Please do NOT wear cologne, perfume, aftershave, or lotions (deodorant is allowed). Please arrive 15 minutes prior to your appointment time.  Follow-Up: At [facility], you and your health needs are our priority.  As part of our continuing mission to provide you with exceptional heart care, we have created designated Provider Care Teams.  These Care Teams include your primary Cardiologist (physician) and Advanced Practice Providers (APPs -  Physician Assistants and Nurse Practitioners) who all work together to provide you with the care you need, when you need it.  Your next appointment:   6 month(s)  The format for your next appointment:   In Person  Provider:   Thurmon Fair, MD {

## 2023-01-07 NOTE — Assessment & Plan Note (Signed)
Continue follow up with oncology. Immunotherapy is to start soon.

## 2023-01-07 NOTE — Assessment & Plan Note (Signed)
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 He is essentially AHA Stage B. We discussed the findings on his echocardiogram today. NYHA II. Volume status stable. His BP is not likely to tolerate the addition of ACE/ARB/ARNI or MRA at this time. I do not think these are indicated at this point as well. I recommend a follow up limited echocardiogram in 8 weeks. If his EF is lower, we can try to adjust GDMT. He is getting ready to start Immunotherapy soon. He may not be able to tolerate much. He is already on [place] in the form of Xigduo.  Continue Farxiga and continue metoprolol tartrate 25 mg twice daily.  Limited echocardiogram in 8 weeks Follow up in 6 mos (or sooner if EF lower)

## 2023-01-08 ENCOUNTER — Other Ambulatory Visit: Payer: Self-pay

## 2023-01-09 ENCOUNTER — Other Ambulatory Visit: Payer: Self-pay | Admitting: Student

## 2023-01-09 DIAGNOSIS — C649 Malignant neoplasm of unspecified kidney, except renal pelvis: Secondary | ICD-10-CM

## 2023-01-09 DIAGNOSIS — Z01812 Encounter for preprocedural laboratory examination: Secondary | ICD-10-CM

## 2023-01-10 ENCOUNTER — Ambulatory Visit
Admission: RE | Admit: 2023-01-10 | Discharge: 2023-01-10 | Disposition: A | Discharge status: Home / Self Care | Payer: Federal, State, Local not specified - PPO | Source: Ambulatory Visit | Attending: Internal Medicine | Admitting: Internal Medicine

## 2023-01-10 ENCOUNTER — Telehealth: Payer: Self-pay

## 2023-01-10 DIAGNOSIS — Z01812 Encounter for preprocedural laboratory examination: Secondary | ICD-10-CM

## 2023-01-10 DIAGNOSIS — C642 Malignant neoplasm of left kidney, except renal pelvis: Secondary | ICD-10-CM

## 2023-01-10 DIAGNOSIS — C649 Malignant neoplasm of unspecified kidney, except renal pelvis: Secondary | ICD-10-CM

## 2023-01-10 MED ORDER — SODIUM CHLORIDE 0.9 % IV SOLN: INTRAVENOUS | Status: DC

## 2023-01-10 NOTE — Progress Notes (Signed)
Patient arrived late for scheduled appointment and was not fasting this morning for administration of sedation medication. Unfortunately, patient will need to be rescheduled for image-guided port placement. Kennieth Francois, PA-C 01/10/2023

## 2023-01-10 NOTE — Telephone Encounter (Signed)
Patient ate breakfast- IR appointment for port placement to be rescheduled

## 2023-01-11 ENCOUNTER — Encounter: Payer: Self-pay | Admitting: Internal Medicine

## 2023-01-13 ENCOUNTER — Ambulatory Visit
Admission: RE | Admit: 2023-01-13 | Discharge: 2023-01-13 | Disposition: A | Discharge status: Home / Self Care | Payer: Federal, State, Local not specified - PPO | Source: Ambulatory Visit | Attending: Internal Medicine | Admitting: Internal Medicine

## 2023-01-13 ENCOUNTER — Inpatient Hospital Stay: Payer: Federal, State, Local not specified - PPO

## 2023-01-13 ENCOUNTER — Inpatient Hospital Stay (HOSPITAL_BASED_OUTPATIENT_CLINIC_OR_DEPARTMENT_OTHER): Payer: Federal, State, Local not specified - PPO | Admitting: Internal Medicine

## 2023-01-13 ENCOUNTER — Telehealth: Payer: Self-pay

## 2023-01-13 VITALS — BP 116/82 | HR 90 | Temp 98.3°F | Wt 151.4 lb

## 2023-01-13 DIAGNOSIS — I2699 Other pulmonary embolism without acute cor pulmonale: Secondary | ICD-10-CM | POA: Diagnosis not present

## 2023-01-13 DIAGNOSIS — M25551 Pain in right hip: Secondary | ICD-10-CM | POA: Diagnosis not present

## 2023-01-13 DIAGNOSIS — R600 Localized edema: Secondary | ICD-10-CM | POA: Diagnosis not present

## 2023-01-13 DIAGNOSIS — J9811 Atelectasis: Secondary | ICD-10-CM | POA: Diagnosis not present

## 2023-01-13 DIAGNOSIS — M1611 Unilateral primary osteoarthritis, right hip: Secondary | ICD-10-CM | POA: Diagnosis not present

## 2023-01-13 DIAGNOSIS — C642 Malignant neoplasm of left kidney, except renal pelvis: Secondary | ICD-10-CM

## 2023-01-13 DIAGNOSIS — D508 Other iron deficiency anemias: Secondary | ICD-10-CM | POA: Diagnosis not present

## 2023-01-13 DIAGNOSIS — M898X5 Other specified disorders of bone, thigh: Secondary | ICD-10-CM | POA: Diagnosis not present

## 2023-01-13 DIAGNOSIS — M24151 Other articular cartilage disorders, right hip: Secondary | ICD-10-CM | POA: Diagnosis not present

## 2023-01-13 DIAGNOSIS — Z5112 Encounter for antineoplastic immunotherapy: Secondary | ICD-10-CM

## 2023-01-13 DIAGNOSIS — E876 Hypokalemia: Secondary | ICD-10-CM | POA: Diagnosis not present

## 2023-01-13 DIAGNOSIS — D509 Iron deficiency anemia, unspecified: Secondary | ICD-10-CM | POA: Diagnosis not present

## 2023-01-13 DIAGNOSIS — Z79620 Long term (current) use of immunosuppressive biologic: Secondary | ICD-10-CM | POA: Diagnosis not present

## 2023-01-13 LAB — CBC WITH DIFFERENTIAL (CANCER CENTER ONLY)
Abs Immature Granulocytes: 0.01 10*3/uL (ref 0.00–0.07)
Basophils Absolute: 0 10*3/uL (ref 0.0–0.1)
Basophils Relative: 1 %
Eosinophils Absolute: 0.3 10*3/uL (ref 0.0–0.5)
Eosinophils Relative: 7 %
HCT: 31.8 % — ABNORMAL LOW (ref 39.0–52.0)
Hemoglobin: 9.9 g/dL — ABNORMAL LOW (ref 13.0–17.0)
Immature Granulocytes: 0 %
Lymphocytes Relative: 25 %
Lymphs Abs: 1.1 10*3/uL (ref 0.7–4.0)
MCH: 24.8 pg — ABNORMAL LOW (ref 26.0–34.0)
MCHC: 31.1 g/dL (ref 30.0–36.0)
MCV: 79.5 fL — ABNORMAL LOW (ref 80.0–100.0)
Monocytes Absolute: 0.5 10*3/uL (ref 0.1–1.0)
Monocytes Relative: 10 %
Neutro Abs: 2.6 10*3/uL (ref 1.7–7.7)
Neutrophils Relative %: 57 %
Platelet Count: 244 10*3/uL (ref 150–400)
RBC: 4 MIL/uL — ABNORMAL LOW (ref 4.22–5.81)
RDW: 15.9 % — ABNORMAL HIGH (ref 11.5–15.5)
WBC Count: 4.5 10*3/uL (ref 4.0–10.5)
nRBC: 0 % (ref 0.0–0.2)

## 2023-01-13 LAB — TSH: TSH: 0.426 u[IU]/mL (ref 0.350–4.500)

## 2023-01-13 LAB — CMP (CANCER CENTER ONLY)
ALT: 12 U/L (ref 0–44)
AST: 14 U/L — ABNORMAL LOW (ref 15–41)
Albumin: 3.5 g/dL (ref 3.5–5.0)
Alkaline Phosphatase: 92 U/L (ref 38–126)
Anion gap: 12 (ref 5–15)
BUN: 10 mg/dL (ref 6–20)
CO2: 24 mmol/L (ref 22–32)
Calcium: 9.2 mg/dL (ref 8.9–10.3)
Chloride: 101 mmol/L (ref 98–111)
Creatinine: 0.85 mg/dL (ref 0.61–1.24)
GFR, Estimated: >60 mL/min (ref >60)
Glucose, Bld: 137 mg/dL — ABNORMAL HIGH (ref 70–99)
Potassium: 3 mmol/L — ABNORMAL LOW (ref 3.5–5.1)
Sodium: 137 mmol/L (ref 135–145)
Total Bilirubin: 0.5 mg/dL (ref 0.3–1.2)
Total Protein: 7.8 g/dL (ref 6.5–8.1)

## 2023-01-13 MED ORDER — POTASSIUM CHLORIDE CRYS ER 20 MEQ PO TBCR: 20.0000 meq | EXTENDED_RELEASE_TABLET | Freq: Every day | ORAL | 0 refills | Status: DC

## 2023-01-13 MED ORDER — SODIUM CHLORIDE 0.9 % IV SOLN
200.0000 mg | Freq: Once | INTRAVENOUS | Status: AC
  Administered 2023-01-13: 200 mg via INTRAVENOUS
  Filled 2023-01-13: qty 8

## 2023-01-13 MED ORDER — GADOBUTROL 1 MMOL/ML IV SOLN
6.0000 mL | Freq: Once | INTRAVENOUS | Status: AC | PRN
  Administered 2023-01-13: 6 mL via INTRAVENOUS

## 2023-01-13 MED ORDER — SODIUM CHLORIDE 0.9 % IV SOLN
Freq: Once | INTRAVENOUS | Status: AC
  Filled 2023-01-13: qty 250

## 2023-01-13 MED ORDER — POTASSIUM CHLORIDE 20 MEQ/100ML IV SOLN: 20.0000 meq | Freq: Once | INTRAVENOUS | Status: DC

## 2023-01-13 MED ORDER — POTASSIUM CHLORIDE 20 MEQ/100ML IV SOLN
20.0000 meq | Freq: Once | INTRAVENOUS | Status: AC
  Administered 2023-01-13: 20 meq via INTRAVENOUS

## 2023-01-13 NOTE — Telephone Encounter (Signed)
Patient missed last port apt. Informed patient per Marylu Lund new port apt scheduled for Mon 7/15 at 1230p. Patient agrees and verbalizes understanding

## 2023-01-13 NOTE — Progress Notes (Signed)
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 fraction, by estimation, is 40 to 45%. The left ventricle has mildly decreased function. The left ventricle demonstrates global hypokinesis. Left ventricular diastolic parameters are consistent with Grade I diastolic dysfunction (impaired relaxation).  2. Right ventricular systolic function is normal. The right ventricular size is normal. Tricuspid regurgitation signal is inadequate for assessing PA pressure.  3. The mitral valve is normal in structure. Trivial mitral valve regurgitation. No evidence of mitral stenosis.  4. The aortic valve is normal in structure. Aortic valve regurgitation is trivial. No aortic stenosis is present. FINDINGS  Left Ventricle: Left ventricular ejection fraction, by estimation, is 40 to 45%. The left ventricle has mildly decreased function. The left ventricle demonstrates global hypokinesis. The left ventricular internal cavity size was normal in size. There is  no left ventricular hypertrophy. Left ventricular diastolic parameters are consistent with Grade I diastolic  dysfunction (impaired relaxation). Normal left ventricular filling pressure. Right Ventricle: The right ventricular size is normal. No increase in right ventricular wall thickness. Right ventricular systolic function is normal. Tricuspid regurgitation signal is inadequate for assessing PA pressure. Left Atrium: Left atrial size was normal in size. Right Atrium: Right atrial size was normal in size. Pericardium: There is no evidence of pericardial effusion. Mitral Valve: The mitral valve is normal in structure. Trivial mitral valve regurgitation. No evidence of mitral valve stenosis. Tricuspid Valve: The tricuspid valve is normal in structure. Tricuspid valve regurgitation is trivial. No evidence of tricuspid stenosis. Aortic Valve: The aortic valve is normal in structure. Aortic valve regurgitation is trivial. No aortic stenosis is present. Pulmonic Valve: The pulmonic valve was not well visualized. Pulmonic valve regurgitation is not visualized. No evidence of pulmonic stenosis. Aorta: The aortic root is normal in size and structure. Venous: The inferior vena cava was not well visualized. IAS/Shunts: No atrial level shunt detected by color flow Doppler.  LEFT VENTRICLE PLAX 2D LVIDd:         4.10 cm      Diastology LVIDs:         3.30 cm      LV e' medial:  9.25 cm/s LV PW:         1.00 cm      LV E/e' medial:  6.5 LV IVS:        1.00 cm      LV e' lateral:   10.90 cm/s LVOT diam:     2.20 cm      LV E/e' lateral: 5.5 LV SV:         58 LV SV Index:   30 LVOT Area:     3.80 cm                              3D Volume EF: LV Volumes (MOD)            3D EF:        39 % LV vol d, MOD A2C: 96.9 ml  LV EDV:       137 ml LV vol d, MOD A4C: 104.0 ml LV ESV:       83 ml LV vol s, MOD A2C: 55.4 ml  LV SV:        54 ml LV vol s, MOD A4C: 59.2 ml LV SV MOD A2C:     41.5 ml LV SV MOD A4C:     104.0 ml LV SV MOD BP:      44.7 ml RIGHT VENTRICLE RV S prime:     6.74 cm/s TAPSE (M-mode): 1.1 cm LEFT ATRIUM           Index         RIGHT ATRIUM          Index LA diam:      3.40 cm 1.76 cm/m   RA Area:     7.97 cm LA Vol (A2C): 22.2 ml 11.51 ml/m  RA Volume:   11.00 ml 5.70 ml/m LA Vol (A4C): 23.3 ml 12.08 ml/m  AORTIC VALVE LVOT Vmax:   90.80 cm/s LVOT Vmean:  62.900 cm/s LVOT VTI:    0.152 m  AORTA Ao Root diam: 3.20 cm Ao Asc diam:  3.30 cm MITRAL VALVE MV Area (PHT): 4.82 cm    SHUNTS MV Decel Time: 157 msec    Systemic VTI:  0.15 m MV E velocity: 59.90 cm/s  Systemic Diam: 2.20 cm MV A velocity: 83.80 cm/s MV E/A ratio:  0.71 Armanda Magic MD Electronically signed by Armanda Magic MD Signature Date/Time: 12/22/2022/2:37:07 PM    Final    VAS [place] LOWER EXTREMITY VENOUS (DVT)  Result Date: 12/20/2022  Lower Venous DVT Study Patient Name:  TYRIK STETZER  Date of Exam:   12/19/2022 Medical Rec #: [id number]       Accession #:    [id number] Date of Birth: 12-Jun-1971       Patient Gender: M Patient Age:   9 years Exam Location:  [facility] Procedure:      VAS [place] LOWER EXTREMITY VENOUS (DVT) Referring Phys: Marcial Pacas OPYD --------------------------------------------------------------------------------  Indications: Pulmonary embolism. 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 Comparison Study: No prior studies. Performing Technologist: Jean Rosenthal RDMS, RVT  Examination Guidelines: A complete evaluation includes B-mode imaging, spectral Doppler, color Doppler, and power Doppler as needed of all accessible portions of each vessel. Bilateral testing is considered an integral part of  a complete examination. Limited examinations for reoccurring indications may be performed as noted. The reflux portion of the exam is performed with the patient in reverse Trendelenburg.  +---------+---------------+---------+-----------+----------+--------------+ RIGHT    CompressibilityPhasicitySpontaneityPropertiesThrombus Aging  +---------+---------------+---------+-----------+----------+--------------+ CFV      Full           Yes                                          +---------+---------------+---------+-----------+----------+--------------+ SFJ      Full                                                        +---------+---------------+---------+-----------+----------+--------------+ FV Prox  Full                                                        +---------+---------------+---------+-----------+----------+--------------+ FV Mid   Full                                                        +---------+---------------+---------+-----------+----------+--------------+ FV DistalFull           Yes      Yes                                 +---------+---------------+---------+-----------+----------+--------------+ PFV      Full                                                        +---------+---------------+---------+-----------+----------+--------------+ POP      Full           Yes      Yes                                 +---------+---------------+---------+-----------+----------+--------------+ PTV      Full                                                        +---------+---------------+---------+-----------+----------+--------------+ PERO     Full                                                        +---------+---------------+---------+-----------+----------+--------------+   +---------+---------------+---------+-----------+----------+--------------+ LEFT     CompressibilityPhasicitySpontaneityPropertiesThrombus Aging +---------+---------------+---------+-----------+----------+--------------+ CFV      Full  Yes      Yes                                 +---------+---------------+---------+-----------+----------+--------------+ SFJ      Full                                                         +---------+---------------+---------+-----------+----------+--------------+ FV Prox  Full                                                        +---------+---------------+---------+-----------+----------+--------------+ FV Mid   Full                                                        +---------+---------------+---------+-----------+----------+--------------+ FV DistalFull           Yes      Yes                                 +---------+---------------+---------+-----------+----------+--------------+ PFV      Full                                                        +---------+---------------+---------+-----------+----------+--------------+ POP      Full           Yes      Yes                                 +---------+---------------+---------+-----------+----------+--------------+ PTV      Full                                                        +---------+---------------+---------+-----------+----------+--------------+ PERO     Full                                                        +---------+---------------+---------+-----------+----------+--------------+     Summary: RIGHT: - There is no evidence of deep vein thrombosis in the lower extremity.  - No cystic structure found in the popliteal fossa.  LEFT: - There is no evidence of deep vein thrombosis in the lower extremity.  - No cystic structure found in the popliteal fossa.  *See table(s) above for measurements and observations. Electronically signed by Sherald Hess MD on 12/20/2022 at 9:24:00 AM.    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 Bronchial biopsy procedure also was done on 12/09/2022. Pulmonary embolism suspected, high probability. Abdominal pain also. EXAM: CT ANGIOGRAPHY CHEST CT ABDOMEN AND PELVIS WITH CONTRAST TECHNIQUE: Multidetector CT  imaging of the chest was performed using the standard protocol during bolus administration of intravenous contrast. Multiplanar CT image reconstructions and MIPs were obtained to evaluate the vascular anatomy. Multidetector CT imaging of the abdomen and pelvis was performed using the standard protocol during bolus administration of intravenous contrast. RADIATION DOSE REDUCTION: This exam was performed according to the departmental dose-optimization program which includes automated exposure control, adjustment of the mA and/or kV according to patient size and/or use of iterative reconstruction technique. CONTRAST:  OMNIPAQUE IOHEXOL 350 MG/ML SOLN COMPARISON:  Portable chest today, portable chest 12/16/2022 and 12/09/2022, chest CT without contrast 12/07/2022, CTA chest 10/15/2022, MRI abdomen 11/15/2022 and CT abdomen and pelvis with contrast 10/28/2022. FINDINGS: CTA CHEST FINDINGS Cardiovascular: The pulmonary trunk is upper limit of normal in caliber at 2.8 cm, was previously 2.3 cm. There are bilateral acute arterial emboli, overall moderate clot burden. There is no increase in the RV/LV ratio and no significant IVC reflux pattern. No acute right heart strain is suspected. On the left, several subsegmental emboli are noted in the lingula and apical segments. There is thrombus in the distal left lower lobe main artery extending into all of the segmental arteries with nonocclusive thrombus, and in a few scattered downstream subsegmental arteries where there is probably occlusive thrombus in the some of the basal subsegments. On the right, there are scattered subsegmental arterial partially occlusive emboli in the anterior and apical segments. There are a few subsegmental emboli in the lateral and medial right middle lobe segments some of which appear occlusive. There are additional scattered subsegmental emboli in the basal segments of the lower lobe and in the superior segment. There is no pericardial  effusion. There is mild cardiomegaly with a left chamber predominance, interval increased. There is trace calcification in the LAD coronary artery. Thoracic aorta is tortuous, minimal calcific plaque in the distal arch is seen without aneurysm, stenosis or dissection. The great vessels are clear.  Pulmonary veins are nondistended. Mediastinum/Nodes: There is extensive pneumomediastinum which is primarily anterior, some of this extending into the right paratracheal and left-to-right lateral prevascular spaces. This probably tracked up from the abdomen where there is free intraperitoneal air presumably from the recent surgery. Esophageal thickness is normal. None of the air is seen around the esophagus. The trachea and main bronchi are clear. Unremarkable thyroid gland. No axillary or intrathoracic adenopathy. Lungs/Pleura: Small layering left pleural effusion. No right pleural effusion. No pneumothorax. There is posterior atelectasis in both lungs. New finding of patchy dense consolidation in the left lower lobe basal segments, focal consolidation in the superior segment of the right lower lobe findings could be due to pneumonia, infarctions or combination. No interstitial edema is seen. Remaining lungs are generally clear. 8 mm left lower lobe nodule noted previously is obscured due to left lung consolidation. Musculoskeletal: No aggressive bone lesion is seen. Review of the MIP images confirms the above findings. CT ABDOMEN and PELVIS FINDINGS Hepatobiliary: There is a small stone in the distal gallbladder. No wall thickening or bile duct dilatation. There is no liver mass enhancement. Pancreas: No abnormality. Spleen: No abnormality. Adrenals/Urinary Tract: Interval left nephroadrenalectomy. There are scattered retroperitoneal air  pockets in the operative bed. There is mild low-density fluid settling posteriorly in the left renal fossa, with low-density fluid measuring 7.4 x 2.8 x 12.4 cm. This is probably a  postoperative seroma or residual postoperative fluid. There is no right adrenal or renal mass enhancement. There is a 5 mm nonobstructive stone in the posterior right kidney. No obstructing stone is evident. There is mild bladder thickening versus underdistention. Correlate with urinalysis. Stomach/Bowel: Chronic gastric fold thickening. Likely chronic gastritis. The appendix and bowel are unremarkable. Vascular/Lymphatic: There is a small volume of thrombus in the IVC extending into it from the ligated left renal vein. On the MRI 11/15/2022 there was enhancing tumor thrombus in the left renal vein but not in the IVC. This could be a small tumor thrombus. Also not entirely certain some of the bilateral pulmonary arterial emboli are not tumor thrombus as well. No other significant vascular findings. There previously was left periaortic chain adenopathy but this has been removed. Reproductive: Uterus and bilateral adnexa are unremarkable. Other: There is extensive pneumoperitoneum, but this is almost all anterior. Some of it tracks along the left dorsal retroperitoneal planes but there are no air pockets along side of the bowel. This is probably postoperative free air, with hollow viscous perforation not strictly excluded. There is additional moderately extensive left abdominal wall emphysema, tracking into the groin areas. Some of the intraperitoneal air tracks into the inguinal canals and left hemiscrotum. No abscess is seen. A small amount of free fluid of low-density collects in the posterior right deep pelvis. Additional scattered fluid extends along the left retroperitoneum anterior to the aorta. Musculoskeletal: No aggressive regional osseous lesions. Review of the MIP images confirms the above findings. IMPRESSION: 1. Bilateral acute arterial emboli with overall moderate clot burden but no findings of acute right heart strain. Possible at least some of the embolic disease could be tumor thrombus. 2. Mild  cardiomegaly with trace calcification in the LAD coronary artery. 3. Small left pleural effusion. 4. Bilateral lower lobe consolidations, left greater than right, could be due to pneumonia, infarctions or combination. 5. Extensive pneumomediastinum, and extensive left abdominal wall emphysema tracking into the inguinal canals and left hemiscrotum. 6. Small volume of thrombus in the IVC extending into it from the ligated left renal vein. On the MRI 11/15/2022 there was enhancing tumor thrombus in the left renal vein but not in the IVC. This could be a small tumor thrombus. 7. Interval left nephroadrenalectomy with low-density fluid settling posteriorly in the left renal fossa measuring 7.4 x 2.8 x 12.4 cm. This is probably a postoperative seroma or residual postoperative fluid. 8. Extensive pneumoperitoneum, almost all of which is anterior. This is probably postoperative free air, with hollow viscus perforation not strictly excluded. 9. Small amount of low-density free fluid in the posterior right deep pelvis. 10. Cholelithiasis. 11. Nonobstructive nephrolithiasis. 12. Aortic atherosclerosis.  Remaining findings described above. 13. Critical Value/emergent results were called by telephone at the time of interpretation on 12/19/2022 at 2:06 am to provider [facility] , who verbally acknowledged these results. Aortic Atherosclerosis (ICD10-I70.0). Electronically Signed   By: Almira Bar M.D.   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 Bronchial biopsy procedure also was done on 12/09/2022. Pulmonary embolism suspected, high probability. Abdominal pain also. EXAM: CT ANGIOGRAPHY CHEST CT ABDOMEN AND PELVIS WITH CONTRAST TECHNIQUE: Multidetector CT imaging of the chest  was performed using the standard protocol during bolus administration of intravenous contrast.  Multiplanar CT image reconstructions and MIPs were obtained to evaluate the vascular anatomy. Multidetector CT imaging of the abdomen and pelvis was performed using the standard protocol during bolus administration of intravenous contrast. RADIATION DOSE REDUCTION: This exam was performed according to the departmental dose-optimization program which includes automated exposure control, adjustment of the mA and/or kV according to patient size and/or use of iterative reconstruction technique. CONTRAST:  OMNIPAQUE IOHEXOL 350 MG/ML SOLN COMPARISON:  Portable chest today, portable chest 12/16/2022 and 12/09/2022, chest CT without contrast 12/07/2022, CTA chest 10/15/2022, MRI abdomen 11/15/2022 and CT abdomen and pelvis with contrast 10/28/2022. FINDINGS: CTA CHEST FINDINGS Cardiovascular: The pulmonary trunk is upper limit of normal in caliber at 2.8 cm, was previously 2.3 cm. There are bilateral acute arterial emboli, overall moderate clot burden. There is no increase in the RV/LV ratio and no significant IVC reflux pattern. No acute right heart strain is suspected. On the left, several subsegmental emboli are noted in the lingula and apical segments. There is thrombus in the distal left lower lobe main artery extending into all of the segmental arteries with nonocclusive thrombus, and in a few scattered downstream subsegmental arteries where there is probably occlusive thrombus in the some of the basal subsegments. On the right, there are scattered subsegmental arterial partially occlusive emboli in the anterior and apical segments. There are a few subsegmental emboli in the lateral and medial right middle lobe segments some of which appear occlusive. There are additional scattered subsegmental emboli in the basal segments of the lower lobe and in the superior segment. There is no pericardial effusion. There is mild cardiomegaly with a left chamber predominance, interval increased. There is trace calcification  in the LAD coronary artery. Thoracic aorta is tortuous, minimal calcific plaque in the distal arch is seen without aneurysm, stenosis or dissection. The great vessels are clear.  Pulmonary veins are nondistended. Mediastinum/Nodes: There is extensive pneumomediastinum which is primarily anterior, some of this extending into the right paratracheal and left-to-right lateral prevascular spaces. This probably tracked up from the abdomen where there is free intraperitoneal air presumably from the recent surgery. Esophageal thickness is normal. None of the air is seen around the esophagus. The trachea and main bronchi are clear. Unremarkable thyroid gland. No axillary or intrathoracic adenopathy. Lungs/Pleura: Small layering left pleural effusion. No right pleural effusion. No pneumothorax. There is posterior atelectasis in both lungs. New finding of patchy dense consolidation in the left lower lobe basal segments, focal consolidation in the superior segment of the right lower lobe findings could be due to pneumonia, infarctions or combination. No interstitial edema is seen. Remaining lungs are generally clear. 8 mm left lower lobe nodule noted previously is obscured due to left lung consolidation. Musculoskeletal: No aggressive bone lesion is seen. Review of the MIP images confirms the above findings. CT ABDOMEN and PELVIS FINDINGS Hepatobiliary: There is a small stone in the distal gallbladder. No wall thickening or bile duct dilatation. There is no liver mass enhancement. Pancreas: No abnormality. Spleen: No abnormality. Adrenals/Urinary Tract: Interval left nephroadrenalectomy. There are scattered retroperitoneal air pockets in the operative bed. There is mild low-density fluid settling posteriorly in the left renal fossa, with low-density fluid measuring 7.4 x 2.8 x 12.4 cm. This is probably a postoperative seroma or residual postoperative fluid. There is no right adrenal or renal mass enhancement. There is a 5 mm  nonobstructive stone in the posterior right kidney.  No obstructing stone is evident. There is mild bladder thickening versus underdistention. Correlate with urinalysis. Stomach/Bowel: Chronic gastric fold thickening. Likely chronic gastritis. The appendix and bowel are unremarkable. Vascular/Lymphatic: There is a small volume of thrombus in the IVC extending into it from the ligated left renal vein. On the MRI 11/15/2022 there was enhancing tumor thrombus in the left renal vein but not in the IVC. This could be a small tumor thrombus. Also not entirely certain some of the bilateral pulmonary arterial emboli are not tumor thrombus as well. No other significant vascular findings. There previously was left periaortic chain adenopathy but this has been removed. Reproductive: Uterus and bilateral adnexa are unremarkable. Other: There is extensive pneumoperitoneum, but this is almost all anterior. Some of it tracks along the left dorsal retroperitoneal planes but there are no air pockets along side of the bowel. This is probably postoperative free air, with hollow viscous perforation not strictly excluded. There is additional moderately extensive left abdominal wall emphysema, tracking into the groin areas. Some of the intraperitoneal air tracks into the inguinal canals and left hemiscrotum. No abscess is seen. A small amount of free fluid of low-density collects in the posterior right deep pelvis. Additional scattered fluid extends along the left retroperitoneum anterior to the aorta. Musculoskeletal: No aggressive regional osseous lesions. Review of the MIP images confirms the above findings. IMPRESSION: 1. Bilateral acute arterial emboli with overall moderate clot burden but no findings of acute right heart strain. Possible at least some of the embolic disease could be tumor thrombus. 2. Mild cardiomegaly with trace calcification in the LAD coronary artery. 3. Small left pleural effusion. 4. Bilateral lower lobe  consolidations, left greater than right, could be due to pneumonia, infarctions or combination. 5. Extensive pneumomediastinum, and extensive left abdominal wall emphysema tracking into the inguinal canals and left hemiscrotum. 6. Small volume of thrombus in the IVC extending into it from the ligated left renal vein. On the MRI 11/15/2022 there was enhancing tumor thrombus in the left renal vein but not in the IVC. This could be a small tumor thrombus. 7. Interval left nephroadrenalectomy with low-density fluid settling posteriorly in the left renal fossa measuring 7.4 x 2.8 x 12.4 cm. This is probably a postoperative seroma or residual postoperative fluid. 8. Extensive pneumoperitoneum, almost all of which is anterior. This is probably postoperative free air, with hollow viscus perforation not strictly excluded. 9. Small amount of low-density free fluid in the posterior right deep pelvis. 10. Cholelithiasis. 11. Nonobstructive nephrolithiasis. 12. Aortic atherosclerosis.  Remaining findings described above. 13. Critical Value/emergent results were called by telephone at the time of interpretation on 12/19/2022 at 2:06 am to provider [facility] , who verbally acknowledged these results. Aortic Atherosclerosis (ICD10-I70.0). Electronically Signed   By: Almira Bar M.D.   On: 12/19/2022 02:50   DG Chest Portable 1 View  Result Date: 12/19/2022 CLINICAL DATA:  Shortness of breath EXAM: PORTABLE CHEST 1 VIEW COMPARISON:  12/16/2022 FINDINGS: Continued free air under the right hemi diets crash that continued free air under the hemidiaphragms, likely related to recent surgery. Low lung volumes with bibasilar atelectasis and vascular congestion. Heart and mediastinal contours within normal limits. No visible effusions or pneumothorax. IMPRESSION: Low lung volumes, bibasilar atelectasis, vascular congestion. Pneumoperitoneum, likely related to recent laparoscopic surgery. Electronically Signed   By: Charlett Nose  M.D.   On: 12/19/2022 00:32   DG CHEST PORT 1 VIEW  Result Date: 12/16/2022 CLINICAL DATA:  Chest pain.  Status post  left nephrectomy EXAM: PORTABLE CHEST 1 VIEW COMPARISON:  12/09/2022 x-ray FINDINGS: Underinflation with increasing lung base opacities. Atelectasis is favored over infiltrate recommend follow-up. No pneumothorax or effusion. Normal cardiopericardial silhouette. Film is rotated to the left. There is lucency beneath the diaphragm diffusely. Possible free air. Patient is status post nephrectomy. IMPRESSION: Underinflation with increasing lung base opacities. Atelectasis is favored over infiltrate recommend follow-up. Free air seen in the abdomen. This very well could be postoperative in light of the patient's recent laparoscopic nephrectomy. Please correlate with specific history Electronically Signed   By: Karen Kays M.D.   On: 12/16/2022 15:12

## 2023-01-13 NOTE — Patient Instructions (Signed)
[facility]  Discharge Instructions: Thank you for choosing [facility] to provide your oncology and hematology care.  If you have a lab appointment with the Cancer Center, please go directly to the Cancer Center and check in at the registration area.  Wear comfortable clothing and clothing appropriate for easy access to any Portacath or PICC line.   We strive to give you quality time with your provider. You may need to reschedule your appointment if you arrive late (15 or more minutes).  Arriving late affects you and other patients whose appointments are after yours.  Also, if you miss three or more appointments without notifying the office, you may be dismissed from the clinic at the provider's discretion.      For prescription refill requests, have your pharmacy contact our office and allow 72 hours for refills to be completed.    Today you received the following chemotherapy and/or immunotherapy agents keytruda      To help prevent nausea and vomiting after your treatment, we encourage you to take your nausea medication as directed.  BELOW ARE SYMPTOMS THAT SHOULD BE REPORTED IMMEDIATELY: *FEVER GREATER THAN 100.4 F (38 C) OR HIGHER *CHILLS OR SWEATING *NAUSEA AND VOMITING THAT IS NOT CONTROLLED WITH YOUR NAUSEA MEDICATION *UNUSUAL SHORTNESS OF BREATH *UNUSUAL BRUISING OR BLEEDING *URINARY PROBLEMS (pain or burning when urinating, or frequent urination) *BOWEL PROBLEMS (unusual diarrhea, constipation, pain near the anus) TENDERNESS IN MOUTH AND THROAT WITH OR WITHOUT PRESENCE OF ULCERS (sore throat, sores in mouth, or a toothache) UNUSUAL RASH, SWELLING OR PAIN  UNUSUAL VAGINAL DISCHARGE OR ITCHING   Items with * indicate a potential emergency and should be followed up as soon as possible or go to the Emergency Department if any problems should occur.  Please show the CHEMOTHERAPY ALERT CARD or IMMUNOTHERAPY ALERT CARD at check-in to  the Emergency Department and triage nurse.  Should you have questions after your visit or need to cancel or reschedule your appointment, please contact [facility]  [phone] and follow the prompts.  Office hours are 8:00 a.m. to 4:30 p.m. Monday - Friday. Please note that voicemails left after 4:00 p.m. may not be returned until the following business day.  We are closed weekends and major holidays. You have access to a nurse at all times for urgent questions. Please call the main number to the clinic [phone] and follow the prompts.  For any non-urgent questions, you may also contact your provider using MyChart. We now offer e-Visits for anyone 18 and older to request care online for non-urgent symptoms. For details visit [email or web address].   Also download the MyChart app! Go to the app store, search "MyChart", open the app, select [facility], and log in with your MyChart username and password.  Pembrolizumab Injection What is this medication? PEMBROLIZUMAB (PEM broe LIZ ue mab) treats some types of cancer. It works by helping your immune system slow or stop the spread of cancer cells. It is a monoclonal antibody. This medicine may be used for other purposes; ask your health care provider or pharmacist if you have questions. COMMON BRAND NAME(S): Keytruda What should I tell my care team before I take this medication? They need to know if you have any of these conditions: Allogeneic stem cell transplant (uses someone else's stem cells) Autoimmune diseases, such as Crohn disease, ulcerative colitis, lupus History of chest radiation Nervous system problems, such as Guillain-Barre syndrome, myasthenia gravis   Organ transplant An unusual or allergic reaction to pembrolizumab, other medications, foods, dyes, or preservatives Pregnant or trying to get pregnant Breast-feeding How should I use this medication? This medication is injected into a vein. It  is given by your care team in a hospital or clinic setting. A special MedGuide will be given to you before each treatment. Be sure to read this information carefully each time. Talk to your care team about the use of this medication in children. While it may be prescribed for children as young as 6 months for selected conditions, precautions do apply. Overdosage: If you think you have taken too much of this medicine contact a poison control center or emergency room at once. NOTE: This medicine is only for you. Do not share this medicine with others. What if I miss a dose? Keep appointments for follow-up doses. It is important not to miss your dose. Call your care team if you are unable to keep an appointment. What may interact with this medication? Interactions have not been studied. This list may not describe all possible interactions. Give your health care provider a list of all the medicines, herbs, non-prescription drugs, or dietary supplements you use. Also tell them if you smoke, drink alcohol, or use illegal drugs. Some items may interact with your medicine. What should I watch for while using this medication? Your condition will be monitored carefully while you are receiving this medication. You may need blood work while taking this medication. This medication may cause serious skin reactions. They can happen weeks to months after starting the medication. Contact your care team right away if you notice fevers or flu-like symptoms with a rash. The rash may be red or purple and then turn into blisters or peeling of the skin. You may also notice a red rash with swelling of the face, lips, or lymph nodes in your neck or under your arms. Tell your care team right away if you have any change in your eyesight. Talk to your care team if you may be pregnant. Serious birth defects can occur if you take this medication during pregnancy and for 4 months after the last dose. You will need a negative  pregnancy test before starting this medication. Contraception is recommended while taking this medication and for 4 months after the last dose. Your care team can help you find the option that works for you. Do not breastfeed while taking this medication and for 4 months after the last dose. What side effects may I notice from receiving this medication? Side effects that you should report to your care team as soon as possible: Allergic reactions--skin rash, itching, hives, swelling of the face, lips, tongue, or throat Dry cough, shortness of breath or trouble breathing Eye pain, redness, irritation, or discharge with blurry or decreased vision Heart muscle inflammation--unusual weakness or fatigue, shortness of breath, chest pain, fast or irregular heartbeat, dizziness, swelling of the ankles, feet, or hands Hormone gland problems--headache, sensitivity to light, unusual weakness or fatigue, dizziness, fast or irregular heartbeat, increased sensitivity to cold or heat, excessive sweating, constipation, hair loss, increased thirst or amount of urine, tremors or shaking, irritability Infusion reactions--chest pain, shortness of breath or trouble breathing, feeling faint or lightheaded Kidney injury (glomerulonephritis)--decrease in the amount of urine, red or dark brown urine, foamy or bubbly urine, swelling of the ankles, hands, or feet Liver injury--right upper belly pain, loss of appetite, nausea, light-colored stool, dark yellow or brown urine, yellowing skin or eyes, unusual weakness   or fatigue Pain, tingling, or numbness in the hands or feet, muscle weakness, change in vision, confusion or trouble speaking, loss of balance or coordination, trouble walking, seizures Rash, fever, and swollen lymph nodes Redness, blistering, peeling, or loosening of the skin, including inside the mouth Sudden or severe stomach pain, bloody diarrhea, fever, nausea, vomiting Side effects that usually do not require  medical attention (report to your care team if they continue or are bothersome): Bone, joint, or muscle pain Diarrhea Fatigue Loss of appetite Nausea Skin rash This list may not describe all possible side effects. Call your doctor for medical advice about side effects. You may report side effects to FDA at 1-800-FDA-1088. Where should I keep my medication? This medication is given in a hospital or clinic. It will not be stored at home. NOTE: This sheet is a summary. It may not cover all possible information. If you have questions about this medicine, talk to your doctor, pharmacist, or health care provider.  2024 Elsevier/Gold Standard (2021-11-03 00:00:00)   

## 2023-01-13 NOTE — Progress Notes (Signed)
Patient is having some right hip pain again. He is starting to have some trouble staying asleep.

## 2023-01-14 ENCOUNTER — Other Ambulatory Visit: Payer: Self-pay

## 2023-01-14 ENCOUNTER — Telehealth: Payer: Self-pay

## 2023-01-14 ENCOUNTER — Telehealth: Payer: Self-pay | Admitting: Internal Medicine

## 2023-01-14 DIAGNOSIS — M25551 Pain in right hip: Secondary | ICD-10-CM

## 2023-01-14 LAB — T4: T4, Total: 8.6 ug/dL (ref 4.5–12.0)

## 2023-01-14 NOTE — Telephone Encounter (Signed)
Faxed Urgent [facility] Referral on 01/14/2023 around 10:10 am.

## 2023-01-14 NOTE — Telephone Encounter (Signed)
MRI right hip with and without contrast was reviewed with the patient.  It is showing 3.1 cm enhancing metastatic lesion within the right femoral neck.  The lesion is at high risk for pathological fracture.  Patient was advised to avoid weightbearing on the right side.  Urgent referral to [facility] orthopedic oncology has been made.

## 2023-01-14 NOTE — Progress Notes (Signed)
Patient for IR Port Insertion on Monday 01/17/2023, I called and spoke with the patient on the phone and gave pre-procedure instructions. Pt was made aware to be here at 12:30p, NPO after MN prior to procedure as well as driver post procedure/recovery/discharge. Pt stated understanding.  Called 01/14/2023

## 2023-01-16 ENCOUNTER — Other Ambulatory Visit (HOSPITAL_COMMUNITY): Payer: Self-pay | Admitting: Student

## 2023-01-17 ENCOUNTER — Ambulatory Visit
Admission: RE | Admit: 2023-01-17 | Discharge: 2023-01-17 | Disposition: A | Discharge status: Home / Self Care | Payer: Federal, State, Local not specified - PPO | Source: Ambulatory Visit | Attending: Internal Medicine | Admitting: Internal Medicine

## 2023-01-17 DIAGNOSIS — K219 Gastro-esophageal reflux disease without esophagitis: Secondary | ICD-10-CM | POA: Diagnosis not present

## 2023-01-17 DIAGNOSIS — C649 Malignant neoplasm of unspecified kidney, except renal pelvis: Secondary | ICD-10-CM | POA: Diagnosis not present

## 2023-01-17 DIAGNOSIS — E119 Type 2 diabetes mellitus without complications: Secondary | ICD-10-CM | POA: Insufficient documentation

## 2023-01-17 DIAGNOSIS — G4730 Sleep apnea, unspecified: Secondary | ICD-10-CM | POA: Insufficient documentation

## 2023-01-17 DIAGNOSIS — C642 Malignant neoplasm of left kidney, except renal pelvis: Secondary | ICD-10-CM | POA: Diagnosis not present

## 2023-01-17 DIAGNOSIS — I119 Hypertensive heart disease without heart failure: Secondary | ICD-10-CM | POA: Diagnosis not present

## 2023-01-17 HISTORY — PX: IR IMAGING GUIDED PORT INSERTION: IMG5740

## 2023-01-17 LAB — POTASSIUM (ARMC VASCULAR LAB ONLY): Potassium (ARMC vascular lab): 3.4 mmol/L — ABNORMAL LOW (ref 3.5–5.1)

## 2023-01-17 LAB — GLUCOSE, CAPILLARY: Glucose-Capillary: 92 mg/dL (ref 70–99)

## 2023-01-17 MED ORDER — MIDAZOLAM HCL 2 MG/2ML IJ SOLN
INTRAMUSCULAR | Status: AC
  Filled 2023-01-17: qty 2

## 2023-01-17 MED ORDER — HEPARIN SOD (PORK) LOCK FLUSH 100 UNIT/ML IV SOLN
500.0000 [IU] | Freq: Once | INTRAVENOUS | Status: AC
  Administered 2023-01-17: 500 [IU] via INTRAVENOUS

## 2023-01-17 MED ORDER — FENTANYL CITRATE (PF) 100 MCG/2ML IJ SOLN
INTRAMUSCULAR | Status: AC | PRN
  Administered 2023-01-17 (×2): 25 ug via INTRAVENOUS
  Administered 2023-01-17: 50 ug via INTRAVENOUS

## 2023-01-17 MED ORDER — MIDAZOLAM HCL 2 MG/2ML IJ SOLN
INTRAMUSCULAR | Status: AC | PRN
  Administered 2023-01-17 (×2): 1 mg via INTRAVENOUS
  Administered 2023-01-17 (×2): .5 mg via INTRAVENOUS

## 2023-01-17 MED ORDER — FENTANYL CITRATE (PF) 100 MCG/2ML IJ SOLN
INTRAMUSCULAR | Status: AC
  Filled 2023-01-17: qty 2

## 2023-01-17 MED ORDER — LIDOCAINE-EPINEPHRINE 1 %-1:100000 IJ SOLN
INTRAMUSCULAR | Status: AC
  Filled 2023-01-17: qty 1

## 2023-01-17 MED ORDER — LIDOCAINE-EPINEPHRINE 1 %-1:100000 IJ SOLN
18.0000 mL | Freq: Once | INTRAMUSCULAR | Status: AC
  Administered 2023-01-17: 18 mL via INTRADERMAL

## 2023-01-17 MED ORDER — HEPARIN SOD (PORK) LOCK FLUSH 100 UNIT/ML IV SOLN
INTRAVENOUS | Status: AC
  Filled 2023-01-17: qty 5

## 2023-01-17 MED ORDER — SODIUM CHLORIDE 0.9 % IV SOLN: INTRAVENOUS | Status: DC

## 2023-01-17 NOTE — Procedures (Signed)
Interventional Radiology Procedure Note  Date of Procedure: 01/17/2023  Procedure: Port placement   Findings:  1. Right chest port placement    Complications: No immediate complications noted.   Estimated Blood Loss: minimal  Follow-up and Recommendations: 1. Ready for use    Olive Bass, MD  Vascular & Interventional Radiology  01/17/2023 2:25 PM

## 2023-01-17 NOTE — H&P (Signed)
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                                                        +---------+---------------+---------+-----------+----------+--------------+ FV Mid   Full                                                        +---------+---------------+---------+-----------+----------+--------------+ FV DistalFull           Yes      Yes                                 +---------+---------------+---------+-----------+----------+--------------+  PFV      Full                                                        +---------+---------------+---------+-----------+----------+--------------+ POP      Full           Yes      Yes                                 +---------+---------------+---------+-----------+----------+--------------+ PTV      Full                                                        +---------+---------------+---------+-----------+----------+--------------+ PERO     Full                                                        +---------+---------------+---------+-----------+----------+--------------+     Summary: RIGHT: - There is no evidence of deep vein thrombosis in  the lower extremity.  - No cystic structure found in the popliteal fossa.  LEFT: - There is no evidence of deep vein thrombosis in the lower extremity.  - No cystic structure found in the popliteal fossa.  *See table(s) above for measurements and observations. Electronically signed by Sherald Hess MD on 12/20/2022 at 9:24:00 AM.    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 Bronchial biopsy procedure also was done on 12/09/2022. Pulmonary embolism suspected, high probability. Abdominal pain also. EXAM: CT ANGIOGRAPHY CHEST CT ABDOMEN AND PELVIS WITH CONTRAST TECHNIQUE: Multidetector CT imaging of the chest was performed using the standard protocol during bolus administration of intravenous contrast. Multiplanar CT image reconstructions and MIPs were obtained to evaluate the vascular anatomy. Multidetector CT imaging of the abdomen and pelvis was performed using the standard protocol during bolus administration of intravenous contrast. RADIATION DOSE REDUCTION: This exam was performed according to the departmental dose-optimization program which includes automated exposure control, adjustment of the mA and/or kV according to patient size and/or use of iterative reconstruction technique. CONTRAST:  OMNIPAQUE IOHEXOL 350 MG/ML SOLN COMPARISON:  Portable chest today, portable chest 12/16/2022 and 12/09/2022, chest CT without contrast 12/07/2022, CTA chest 10/15/2022, MRI abdomen 11/15/2022 and CT abdomen and pelvis with contrast 10/28/2022. FINDINGS: CTA CHEST FINDINGS Cardiovascular: The pulmonary trunk is upper limit of normal in caliber at 2.8 cm, was previously 2.3 cm. There are bilateral acute arterial emboli, overall moderate clot burden. There is no increase in the RV/LV ratio and no significant IVC reflux pattern. No acute right heart strain is suspected. On the  left, several subsegmental emboli are noted in the lingula and apical segments. There is thrombus in the distal left lower lobe main artery extending into all of the segmental arteries with nonocclusive thrombus, and in a few scattered downstream subsegmental arteries where there  is probably occlusive thrombus in the some of the basal subsegments. On the right, there are scattered subsegmental arterial partially occlusive emboli in the anterior and apical segments. There are a few subsegmental emboli in the lateral and medial right middle lobe segments some of which appear occlusive. There are additional scattered subsegmental emboli in the basal segments of the lower lobe and in the superior segment. There is no pericardial effusion. There is mild cardiomegaly with a left chamber predominance, interval increased. There is trace calcification in the LAD coronary artery. Thoracic aorta is tortuous, minimal calcific plaque in the distal arch is seen without aneurysm, stenosis or dissection. The great vessels are clear.  Pulmonary veins are nondistended. Mediastinum/Nodes: There is extensive pneumomediastinum which is primarily anterior, some of this extending into the right paratracheal and left-to-right lateral prevascular spaces. This probably tracked up from the abdomen where there is free intraperitoneal air presumably from the recent surgery. Esophageal thickness is normal. None of the air is seen around the esophagus. The trachea and main bronchi are clear. Unremarkable thyroid gland. No axillary or intrathoracic adenopathy. Lungs/Pleura: Small layering left pleural effusion. No right pleural effusion. No pneumothorax. There is posterior atelectasis in both lungs. New finding of patchy dense consolidation in the left lower lobe basal segments, focal consolidation in the superior segment of the right lower lobe findings could be due to pneumonia, infarctions or combination. No interstitial edema is seen.  Remaining lungs are generally clear. 8 mm left lower lobe nodule noted previously is obscured due to left lung consolidation. Musculoskeletal: No aggressive bone lesion is seen. Review of the MIP images confirms the above findings. CT ABDOMEN and PELVIS FINDINGS Hepatobiliary: There is a small stone in the distal gallbladder. No wall thickening or bile duct dilatation. There is no liver mass enhancement. Pancreas: No abnormality. Spleen: No abnormality. Adrenals/Urinary Tract: Interval left nephroadrenalectomy. There are scattered retroperitoneal air pockets in the operative bed. There is mild low-density fluid settling posteriorly in the left renal fossa, with low-density fluid measuring 7.4 x 2.8 x 12.4 cm. This is probably a postoperative seroma or residual postoperative fluid. There is no right adrenal or renal mass enhancement. There is a 5 mm nonobstructive stone in the posterior right kidney. No obstructing stone is evident. There is mild bladder thickening versus underdistention. Correlate with urinalysis. Stomach/Bowel: Chronic gastric fold thickening. Likely chronic gastritis. The appendix and bowel are unremarkable. Vascular/Lymphatic: There is a small volume of thrombus in the IVC extending into it from the ligated left renal vein. On the MRI 11/15/2022 there was enhancing tumor thrombus in the left renal vein but not in the IVC. This could be a small tumor thrombus. Also not entirely certain some of the bilateral pulmonary arterial emboli are not tumor thrombus as well. No other significant vascular findings. There previously was left periaortic chain adenopathy but this has been removed. Reproductive: Uterus and bilateral adnexa are unremarkable. Other: There is extensive pneumoperitoneum, but this is almost all anterior. Some of it tracks along the left dorsal retroperitoneal planes but there are no air pockets along side of the bowel. This is probably postoperative free air, with hollow viscous  perforation not strictly excluded. There is additional moderately extensive left abdominal wall emphysema, tracking into the groin areas. Some of the intraperitoneal air tracks into the inguinal canals and left hemiscrotum. No abscess is seen. A small amount of free fluid of low-density collects in the posterior right deep pelvis. Additional scattered fluid extends along the left retroperitoneum  anterior to the aorta. Musculoskeletal: No aggressive regional osseous lesions. Review of the MIP images confirms the above findings. IMPRESSION: 1. Bilateral acute arterial emboli with overall moderate clot burden but no findings of acute right heart strain. Possible at least some of the embolic disease could be tumor thrombus. 2. Mild cardiomegaly with trace calcification in the LAD coronary artery. 3. Small left pleural effusion. 4. Bilateral lower lobe consolidations, left greater than right, could be due to pneumonia, infarctions or combination. 5. Extensive pneumomediastinum, and extensive left abdominal wall emphysema tracking into the inguinal canals and left hemiscrotum. 6. Small volume of thrombus in the IVC extending into it from the ligated left renal vein. On the MRI 11/15/2022 there was enhancing tumor thrombus in the left renal vein but not in the IVC. This could be a small tumor thrombus. 7. Interval left nephroadrenalectomy with low-density fluid settling posteriorly in the left renal fossa measuring 7.4 x 2.8 x 12.4 cm. This is probably a postoperative seroma or residual postoperative fluid. 8. Extensive pneumoperitoneum, almost all of which is anterior. This is probably postoperative free air, with hollow viscus perforation not strictly excluded. 9. Small amount of low-density free fluid in the posterior right deep pelvis. 10. Cholelithiasis. 11. Nonobstructive nephrolithiasis. 12. Aortic atherosclerosis.  Remaining findings described above. 13. Critical Value/emergent results were called by telephone at  the time of interpretation on 12/19/2022 at 2:06 am to provider [facility] , who verbally acknowledged these results. Aortic Atherosclerosis (ICD10-I70.0). Electronically Signed   By: Almira Bar M.D.   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 Bronchial biopsy procedure also was done on 12/09/2022. Pulmonary embolism suspected, high probability. Abdominal pain also. EXAM: CT ANGIOGRAPHY CHEST CT ABDOMEN AND PELVIS WITH CONTRAST TECHNIQUE: Multidetector CT imaging of the chest was performed using the standard protocol during bolus administration of intravenous contrast. Multiplanar CT image reconstructions and MIPs were obtained to evaluate the vascular anatomy. Multidetector CT imaging of the abdomen and pelvis was performed using the standard protocol during bolus administration of intravenous contrast. RADIATION DOSE REDUCTION: This exam was performed according to the departmental dose-optimization program which includes automated exposure control, adjustment of the mA and/or kV according to patient size and/or use of iterative reconstruction technique. CONTRAST:  OMNIPAQUE IOHEXOL 350 MG/ML SOLN COMPARISON:  Portable chest today, portable chest 12/16/2022 and 12/09/2022, chest CT without contrast 12/07/2022, CTA chest 10/15/2022, MRI abdomen 11/15/2022 and CT abdomen and pelvis with contrast 10/28/2022. FINDINGS: CTA CHEST FINDINGS Cardiovascular: The pulmonary trunk is upper limit of normal in caliber at 2.8 cm, was previously 2.3 cm. There are bilateral acute arterial emboli, overall moderate clot burden. There is no increase in the RV/LV ratio and no significant IVC reflux pattern. No acute right heart strain is suspected. On the left, several subsegmental emboli are noted in the lingula and apical segments. There is thrombus in the distal left lower  lobe main artery extending into all of the segmental arteries with nonocclusive thrombus, and in a few scattered downstream subsegmental arteries where there is probably occlusive thrombus in the some of the basal subsegments. On the right, there are scattered subsegmental arterial partially occlusive emboli in the anterior and apical segments. There are a few subsegmental emboli in the lateral and medial right middle lobe segments some of which appear occlusive. There are additional scattered subsegmental emboli in the basal segments of  the lower lobe and in the superior segment. There is no pericardial effusion. There is mild cardiomegaly with a left chamber predominance, interval increased. There is trace calcification in the LAD coronary artery. Thoracic aorta is tortuous, minimal calcific plaque in the distal arch is seen without aneurysm, stenosis or dissection. The great vessels are clear.  Pulmonary veins are nondistended. Mediastinum/Nodes: There is extensive pneumomediastinum which is primarily anterior, some of this extending into the right paratracheal and left-to-right lateral prevascular spaces. This probably tracked up from the abdomen where there is free intraperitoneal air presumably from the recent surgery. Esophageal thickness is normal. None of the air is seen around the esophagus. The trachea and main bronchi are clear. Unremarkable thyroid gland. No axillary or intrathoracic adenopathy. Lungs/Pleura: Small layering left pleural effusion. No right pleural effusion. No pneumothorax. There is posterior atelectasis in both lungs. New finding of patchy dense consolidation in the left lower lobe basal segments, focal consolidation in the superior segment of the right lower lobe findings could be due to pneumonia, infarctions or combination. No interstitial edema is seen. Remaining lungs are generally clear. 8 mm left lower lobe nodule noted previously is obscured due to left lung consolidation.  Musculoskeletal: No aggressive bone lesion is seen. Review of the MIP images confirms the above findings. CT ABDOMEN and PELVIS FINDINGS Hepatobiliary: There is a small stone in the distal gallbladder. No wall thickening or bile duct dilatation. There is no liver mass enhancement. Pancreas: No abnormality. Spleen: No abnormality. Adrenals/Urinary Tract: Interval left nephroadrenalectomy. There are scattered retroperitoneal air pockets in the operative bed. There is mild low-density fluid settling posteriorly in the left renal fossa, with low-density fluid measuring 7.4 x 2.8 x 12.4 cm. This is probably a postoperative seroma or residual postoperative fluid. There is no right adrenal or renal mass enhancement. There is a 5 mm nonobstructive stone in the posterior right kidney. No obstructing stone is evident. There is mild bladder thickening versus underdistention. Correlate with urinalysis. Stomach/Bowel: Chronic gastric fold thickening. Likely chronic gastritis. The appendix and bowel are unremarkable. Vascular/Lymphatic: There is a small volume of thrombus in the IVC extending into it from the ligated left renal vein. On the MRI 11/15/2022 there was enhancing tumor thrombus in the left renal vein but not in the IVC. This could be a small tumor thrombus. Also not entirely certain some of the bilateral pulmonary arterial emboli are not tumor thrombus as well. No other significant vascular findings. There previously was left periaortic chain adenopathy but this has been removed. Reproductive: Uterus and bilateral adnexa are unremarkable. Other: There is extensive pneumoperitoneum, but this is almost all anterior. Some of it tracks along the left dorsal retroperitoneal planes but there are no air pockets along side of the bowel. This is probably postoperative free air, with hollow viscous perforation not strictly excluded. There is additional moderately extensive left abdominal wall emphysema, tracking into the groin  areas. Some of the intraperitoneal air tracks into the inguinal canals and left hemiscrotum. No abscess is seen. A small amount of free fluid of low-density collects in the posterior right deep pelvis. Additional scattered fluid extends along the left retroperitoneum anterior to the aorta. Musculoskeletal: No aggressive regional osseous lesions. Review of the MIP images confirms the above findings. IMPRESSION: 1. Bilateral acute arterial emboli with overall moderate clot burden but no findings of acute right heart strain. Possible at least some of the embolic disease could be tumor thrombus. 2. Mild cardiomegaly with trace calcification in the LAD  coronary artery. 3. Small left pleural effusion. 4. Bilateral lower lobe consolidations, left greater than right, could be due to pneumonia, infarctions or combination. 5. Extensive pneumomediastinum, and extensive left abdominal wall emphysema tracking into the inguinal canals and left hemiscrotum. 6. Small volume of thrombus in the IVC extending into it from the ligated left renal vein. On the MRI 11/15/2022 there was enhancing tumor thrombus in the left renal vein but not in the IVC. This could be a small tumor thrombus. 7. Interval left nephroadrenalectomy with low-density fluid settling posteriorly in the left renal fossa measuring 7.4 x 2.8 x 12.4 cm. This is probably a postoperative seroma or residual postoperative fluid. 8. Extensive pneumoperitoneum, almost all of which is anterior. This is probably postoperative free air, with hollow viscus perforation not strictly excluded. 9. Small amount of low-density free fluid in the posterior right deep pelvis. 10. Cholelithiasis. 11. Nonobstructive nephrolithiasis. 12. Aortic atherosclerosis.  Remaining findings described above. 13. Critical Value/emergent results were called by telephone at the time of interpretation on 12/19/2022 at 2:06 am to provider [facility] , who verbally acknowledged these results. Aortic  Atherosclerosis (ICD10-I70.0). Electronically Signed   By: Almira Bar M.D.   On: 12/19/2022 02:50   DG Chest Portable 1 View  Result Date: 12/19/2022 CLINICAL DATA:  Shortness of breath EXAM: PORTABLE CHEST 1 VIEW COMPARISON:  12/16/2022 FINDINGS: Continued free air under the right hemi diets crash that continued free air under the hemidiaphragms, likely related to recent surgery. Low lung volumes with bibasilar atelectasis and vascular congestion. Heart and mediastinal contours within normal limits. No visible effusions or pneumothorax. IMPRESSION: Low lung volumes, bibasilar atelectasis, vascular congestion. Pneumoperitoneum, likely related to recent laparoscopic surgery. Electronically Signed   By: Charlett Nose M.D.   On: 12/19/2022 00:32    Labs:  CBC: Recent Labs    12/20/22 0438 12/21/22 0500 12/22/22 1839 01/13/23 0938  WBC 10.0 8.5 11.1* 4.5  HGB 8.3* 8.5* 10.0* 9.9*  HCT 26.7* 27.9* 33.4* 31.8*  PLT 259 279 360 244    COAGS: No results for input(s): "INR", "APTT" in the last 8760 hours.  BMP: Recent Labs    12/20/22 0438 12/21/22 0500 12/22/22 1839 01/13/23 0938  NA 138 137 137 137  K 3.2* 3.5 3.7 3.0*  CL 103 102 100 101  CO2 26 26 23 24   GLUCOSE 158* 119* 103* 137*  BUN 9 9 11 10   CALCIUM 8.4* 8.7* 9.1 9.2  CREATININE 0.99 0.92 0.92 0.85  GFRNONAA >60 >60 >60 >60    LIVER FUNCTION TESTS: Recent Labs    01/28/22 1409 10/14/22 0939 12/19/22 0002 01/13/23 0938  BILITOT 0.8 1.2 0.8 0.5  AST 15 11 12* 14*  ALT 16 12 13 12   ALKPHOS 66 88 79 92  PROT 7.0 8.0 7.9 7.8  ALBUMIN 4.2 3.9 2.6* 3.5    TUMOR MARKERS: No results for input(s): "AFPTM", "CEA", "CA199", "CHROMGRNA" in the last 8760 hours.  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 Patient presents today in his usual state of health and is NPO. Case  has been reviewed by Dr Juliette Alcide and is scheduled to proceed on 01/17/23.  Risks and benefits of image guided port-a-catheter placement was discussed with the patient including, but not limited to bleeding, infection, pneumothorax, or fibrin sheath development and need for additional procedures.  All of  the patient's questions were answered, patient is agreeable to proceed. Consent signed and in chart.   Thank you for this interesting consult.  I greatly enjoyed meeting [employer] and look forward to participating in their care.  A copy of this report was sent to the requesting provider on this date.  Electronically Signed: Kennieth Francois, PA-C 01/17/2023, 1:15 PM   I spent a total of  15 Minutes   in face to face in clinical consultation, greater than 50% of which was counseling/coordinating care for port placement.

## 2023-01-17 NOTE — Progress Notes (Signed)
Patient clinically stable post IR Port placement per Dr Juliette Alcide, tolerated well with stable vitals pre and post procedure. Received Versed 3 mg along with Fentanyl 100 mcg IV for procedure. Denies complaints post procedure. Report given to [facility] RN post procedure/specials/15.

## 2023-01-17 NOTE — Progress Notes (Signed)
Discharge instructions given to patient and SO/Wanda with questions answered. Denies complaints at this time. Ready for discharge.

## 2023-01-18 ENCOUNTER — Telehealth: Payer: Self-pay | Admitting: Family Medicine

## 2023-01-18 ENCOUNTER — Encounter: Payer: Self-pay | Admitting: Internal Medicine

## 2023-01-18 ENCOUNTER — Encounter: Payer: Self-pay | Admitting: Family Medicine

## 2023-01-18 NOTE — Telephone Encounter (Signed)
Handicap Placard Application completed and placed in Dr. Gaylan Gerold office in box for signature.

## 2023-01-18 NOTE — Telephone Encounter (Signed)
Patient dropped off document Handicap Placard, to be filled out by provider. Patient requested to send it back via Call Patient to pick up within ASAP. Document is located in providers tray at front office.Please advise at Mobile [phone] (mobile)

## 2023-01-18 NOTE — Telephone Encounter (Signed)
error 

## 2023-01-19 ENCOUNTER — Other Ambulatory Visit: Payer: Self-pay | Admitting: *Deleted

## 2023-01-19 MED ORDER — LIDOCAINE-PRILOCAINE 2.5-2.5 % EX CREA: 1.0000 | TOPICAL_CREAM | CUTANEOUS | 3 refills | Status: DC | PRN

## 2023-01-19 MED FILL — Iron Sucrose Inj 20 MG/ML (Fe Equiv): INTRAVENOUS | Qty: 10 | Status: AC

## 2023-01-20 ENCOUNTER — Inpatient Hospital Stay: Payer: Federal, State, Local not specified - PPO

## 2023-01-20 VITALS — BP 122/80 | HR 73 | Temp 98.0°F | Resp 17

## 2023-01-20 DIAGNOSIS — E876 Hypokalemia: Secondary | ICD-10-CM | POA: Diagnosis not present

## 2023-01-20 DIAGNOSIS — Z79620 Long term (current) use of immunosuppressive biologic: Secondary | ICD-10-CM | POA: Diagnosis not present

## 2023-01-20 DIAGNOSIS — I2699 Other pulmonary embolism without acute cor pulmonale: Secondary | ICD-10-CM | POA: Diagnosis not present

## 2023-01-20 DIAGNOSIS — Z5112 Encounter for antineoplastic immunotherapy: Secondary | ICD-10-CM | POA: Diagnosis not present

## 2023-01-20 DIAGNOSIS — C642 Malignant neoplasm of left kidney, except renal pelvis: Secondary | ICD-10-CM | POA: Diagnosis not present

## 2023-01-20 DIAGNOSIS — J9811 Atelectasis: Secondary | ICD-10-CM | POA: Diagnosis not present

## 2023-01-20 DIAGNOSIS — D508 Other iron deficiency anemias: Secondary | ICD-10-CM

## 2023-01-20 DIAGNOSIS — D509 Iron deficiency anemia, unspecified: Secondary | ICD-10-CM | POA: Diagnosis not present

## 2023-01-20 MED ORDER — SODIUM CHLORIDE 0.9 % IV SOLN
Freq: Once | INTRAVENOUS | Status: AC
  Filled 2023-01-20: qty 250

## 2023-01-20 MED ORDER — HEPARIN SOD (PORK) LOCK FLUSH 100 UNIT/ML IV SOLN
500.0000 [IU] | Freq: Once | INTRAVENOUS | Status: AC | PRN
  Administered 2023-01-20: 500 [IU]
  Filled 2023-01-20: qty 5

## 2023-01-20 MED ORDER — SODIUM CHLORIDE 0.9 % IV SOLN
200.0000 mg | Freq: Once | INTRAVENOUS | Status: AC
  Administered 2023-01-20: 200 mg via INTRAVENOUS
  Filled 2023-01-20: qty 200

## 2023-01-20 MED ORDER — SODIUM CHLORIDE 0.9% FLUSH
10.0000 mL | Freq: Once | INTRAVENOUS | Status: AC | PRN
  Administered 2023-01-20: 10 mL
  Filled 2023-01-20: qty 10

## 2023-01-20 NOTE — Patient Instructions (Signed)
[facility]  Discharge Instructions: Thank you for choosing [facility] to provide your oncology and hematology care.  If you have a lab appointment with the Cancer Center, please go directly to the Cancer Center and check in at the registration area.  Wear comfortable clothing and clothing appropriate for easy access to any Portacath or PICC line.   We strive to give you quality time with your provider. You may need to reschedule your appointment if you arrive late (15 or more minutes).  Arriving late affects you and other patients whose appointments are after yours.  Also, if you miss three or more appointments without notifying the office, you may be dismissed from the clinic at the provider's discretion.      For prescription refill requests, have your pharmacy contact our office and allow 72 hours for refills to be completed.    Today you received the following chemotherapy and/or immunotherapy agents Venofer.      To help prevent nausea and vomiting after your treatment, we encourage you to take your nausea medication as directed.  BELOW ARE SYMPTOMS THAT SHOULD BE REPORTED IMMEDIATELY: *FEVER GREATER THAN 100.4 F (38 C) OR HIGHER *CHILLS OR SWEATING *NAUSEA AND VOMITING THAT IS NOT CONTROLLED WITH YOUR NAUSEA MEDICATION *UNUSUAL SHORTNESS OF BREATH *UNUSUAL BRUISING OR BLEEDING *URINARY PROBLEMS (pain or burning when urinating, or frequent urination) *BOWEL PROBLEMS (unusual diarrhea, constipation, pain near the anus) TENDERNESS IN MOUTH AND THROAT WITH OR WITHOUT PRESENCE OF ULCERS (sore throat, sores in mouth, or a toothache) UNUSUAL RASH, SWELLING OR PAIN  UNUSUAL VAGINAL DISCHARGE OR ITCHING   Items with * indicate a potential emergency and should be followed up as soon as possible or go to the Emergency Department if any problems should occur.  Please show the CHEMOTHERAPY ALERT CARD or IMMUNOTHERAPY ALERT CARD at check-in to  the Emergency Department and triage nurse.  Should you have questions after your visit or need to cancel or reschedule your appointment, please contact [facility]  [phone] and follow the prompts.  Office hours are 8:00 a.m. to 4:30 p.m. Monday - Friday. Please note that voicemails left after 4:00 p.m. may not be returned until the following business day.  We are closed weekends and major holidays. You have access to a nurse at all times for urgent questions. Please call the main number to the clinic [phone] and follow the prompts.  For any non-urgent questions, you may also contact your provider using MyChart. We now offer e-Visits for anyone 18 and older to request care online for non-urgent symptoms. For details visit [email or web address].   Also download the MyChart app! Go to the app store, search "MyChart", open the app, select [facility], and log in with your MyChart username and password.   

## 2023-01-23 NOTE — Telephone Encounter (Signed)
I will complete upon my return to the office.

## 2023-01-24 NOTE — Telephone Encounter (Signed)
Left message for Paul Flowers that Dr. Ermalene Searing has been out of the office but will sign his handicap placard application, on Tuesday 01/25/23, when she returns to the office.

## 2023-01-25 DIAGNOSIS — C649 Malignant neoplasm of unspecified kidney, except renal pelvis: Secondary | ICD-10-CM | POA: Diagnosis not present

## 2023-01-25 DIAGNOSIS — C7951 Secondary malignant neoplasm of bone: Secondary | ICD-10-CM | POA: Diagnosis not present

## 2023-01-25 NOTE — Telephone Encounter (Signed)
I completed the handicap placard and and it is in my outbox

## 2023-01-25 NOTE — Telephone Encounter (Signed)
Left message for Mr. Toothman that his handicap placard application is ready to be picked up at the front desk.

## 2023-01-27 ENCOUNTER — Other Ambulatory Visit: Payer: Self-pay | Admitting: Internal Medicine

## 2023-01-27 ENCOUNTER — Inpatient Hospital Stay: Payer: Federal, State, Local not specified - PPO

## 2023-01-27 VITALS — BP 124/82 | HR 68 | Temp 98.9°F | Resp 16

## 2023-01-27 DIAGNOSIS — D509 Iron deficiency anemia, unspecified: Secondary | ICD-10-CM | POA: Diagnosis not present

## 2023-01-27 DIAGNOSIS — Z79620 Long term (current) use of immunosuppressive biologic: Secondary | ICD-10-CM | POA: Diagnosis not present

## 2023-01-27 DIAGNOSIS — C642 Malignant neoplasm of left kidney, except renal pelvis: Secondary | ICD-10-CM | POA: Diagnosis not present

## 2023-01-27 DIAGNOSIS — I2699 Other pulmonary embolism without acute cor pulmonale: Secondary | ICD-10-CM | POA: Diagnosis not present

## 2023-01-27 DIAGNOSIS — D508 Other iron deficiency anemias: Secondary | ICD-10-CM

## 2023-01-27 DIAGNOSIS — J9811 Atelectasis: Secondary | ICD-10-CM | POA: Diagnosis not present

## 2023-01-27 DIAGNOSIS — Z5112 Encounter for antineoplastic immunotherapy: Secondary | ICD-10-CM | POA: Diagnosis not present

## 2023-01-27 DIAGNOSIS — E876 Hypokalemia: Secondary | ICD-10-CM | POA: Diagnosis not present

## 2023-01-27 MED ORDER — HEPARIN SOD (PORK) LOCK FLUSH 100 UNIT/ML IV SOLN
500.0000 [IU] | Freq: Once | INTRAVENOUS | Status: AC | PRN
  Administered 2023-01-27: 500 [IU]
  Filled 2023-01-27: qty 5

## 2023-01-27 MED ORDER — SODIUM CHLORIDE 0.9 % IV SOLN
Freq: Once | INTRAVENOUS | Status: AC
  Filled 2023-01-27: qty 250

## 2023-01-27 MED ORDER — SODIUM CHLORIDE 0.9 % IV SOLN
200.0000 mg | Freq: Once | INTRAVENOUS | Status: AC
  Administered 2023-01-27: 200 mg via INTRAVENOUS
  Filled 2023-01-27: qty 200

## 2023-01-27 MED ORDER — SODIUM CHLORIDE 0.9% FLUSH
10.0000 mL | Freq: Once | INTRAVENOUS | Status: AC | PRN
  Administered 2023-01-27: 10 mL
  Filled 2023-01-27: qty 10

## 2023-01-27 NOTE — Patient Instructions (Signed)
Iron Sucrose Injection What is this medication? IRON SUCROSE (EYE ern SOO krose) treats low levels of iron (iron deficiency anemia) in people with kidney disease. Iron is a mineral that plays an important role in making red blood cells, which carry oxygen from your lungs to the rest of your body. This medicine may be used for other purposes; ask your health care provider or pharmacist if you have questions. COMMON BRAND NAME(S): Venofer What should I tell my care team before I take this medication? They need to know if you have any of these conditions: Anemia not caused by low iron levels Heart disease High levels of iron in the blood Kidney disease Liver disease An unusual or allergic reaction to iron, other medications, foods, dyes, or preservatives Pregnant or trying to get pregnant Breastfeeding How should I use this medication? This medication is for infusion into a vein. It is given in a hospital or clinic setting. Talk to your care team about the use of this medication in children. While this medication may be prescribed for children as young as 2 years for selected conditions, precautions do apply. Overdosage: If you think you have taken too much of this medicine contact a poison control center or emergency room at once. NOTE: This medicine is only for you. Do not share this medicine with others. What if I miss a dose? Keep appointments for follow-up doses. It is important not to miss your dose. Call your care team if you are unable to keep an appointment. What may interact with this medication? Do not take this medication with any of the following: Deferoxamine Dimercaprol Other iron products This medication may also interact with the following: Chloramphenicol Deferasirox This list may not describe all possible interactions. Give your health care provider a list of all the medicines, herbs, non-prescription drugs, or dietary supplements you use. Also tell them if you smoke,  drink alcohol, or use illegal drugs. Some items may interact with your medicine. What should I watch for while using this medication? Visit your care team regularly. Tell your care team if your symptoms do not start to get better or if they get worse. You may need blood work done while you are taking this medication. You may need to follow a special diet. Talk to your care team. Foods that contain iron include: whole grains/cereals, dried fruits, beans, or peas, leafy green vegetables, and organ meats (liver, kidney). What side effects may I notice from receiving this medication? Side effects that you should report to your care team as soon as possible: Allergic reactions--skin rash, itching, hives, swelling of the face, lips, tongue, or throat Low blood pressure--dizziness, feeling faint or lightheaded, blurry vision Shortness of breath Side effects that usually do not require medical attention (report to your care team if they continue or are bothersome): Flushing Headache Joint pain Muscle pain Nausea Pain, redness, or irritation at injection site This list may not describe all possible side effects. Call your doctor for medical advice about side effects. You may report side effects to FDA at 1-800-FDA-1088. Where should I keep my medication? This medication is given in a hospital or clinic. It will not be stored at home. NOTE: This sheet is a summary. It may not cover all possible information. If you have questions about this medicine, talk to your doctor, pharmacist, or health care provider.  2024 [employer] (2022-11-26 00:00:00)

## 2023-01-28 ENCOUNTER — Encounter: Payer: Self-pay | Admitting: Internal Medicine

## 2023-01-28 ENCOUNTER — Other Ambulatory Visit: Payer: Self-pay | Admitting: *Deleted

## 2023-01-28 MED ORDER — ONDANSETRON HCL 8 MG PO TABS: 8.0000 mg | ORAL_TABLET | Freq: Three times a day (TID) | ORAL | 2 refills | Status: DC | PRN

## 2023-01-28 NOTE — Progress Notes (Signed)
zofra

## 2023-02-01 ENCOUNTER — Other Ambulatory Visit (INDEPENDENT_AMBULATORY_CARE_PROVIDER_SITE_OTHER): Payer: Federal, State, Local not specified - PPO

## 2023-02-01 DIAGNOSIS — E1165 Type 2 diabetes mellitus with hyperglycemia: Secondary | ICD-10-CM | POA: Diagnosis not present

## 2023-02-01 LAB — BASIC METABOLIC PANEL
BUN: 10 mg/dL (ref 6–23)
CO2: 27 mEq/L (ref 19–32)
Calcium: 10 mg/dL (ref 8.4–10.5)
Chloride: 100 mEq/L (ref 96–112)
Creatinine, Ser: 0.98 mg/dL (ref 0.40–1.50)
GFR: 87.76 mL/min (ref >60.00)
Glucose, Bld: 102 mg/dL — ABNORMAL HIGH (ref 70–99)
Potassium: 3.3 mEq/L — ABNORMAL LOW (ref 3.5–5.1)
Sodium: 139 mEq/L (ref 135–145)

## 2023-02-01 LAB — HEMOGLOBIN A1C: Hgb A1c MFr Bld: 5.7 % (ref 4.6–6.5)

## 2023-02-03 ENCOUNTER — Inpatient Hospital Stay (HOSPITAL_BASED_OUTPATIENT_CLINIC_OR_DEPARTMENT_OTHER): Payer: Federal, State, Local not specified - PPO | Admitting: Internal Medicine

## 2023-02-03 ENCOUNTER — Inpatient Hospital Stay: Payer: Federal, State, Local not specified - PPO

## 2023-02-03 ENCOUNTER — Inpatient Hospital Stay: Payer: Federal, State, Local not specified - PPO | Attending: Internal Medicine

## 2023-02-03 ENCOUNTER — Ambulatory Visit: Payer: Federal, State, Local not specified - PPO

## 2023-02-03 VITALS — BP 113/83 | HR 85 | Temp 98.9°F | Wt 151.0 lb

## 2023-02-03 DIAGNOSIS — C7951 Secondary malignant neoplasm of bone: Secondary | ICD-10-CM | POA: Diagnosis not present

## 2023-02-03 DIAGNOSIS — C642 Malignant neoplasm of left kidney, except renal pelvis: Secondary | ICD-10-CM

## 2023-02-03 DIAGNOSIS — Z7901 Long term (current) use of anticoagulants: Secondary | ICD-10-CM | POA: Insufficient documentation

## 2023-02-03 DIAGNOSIS — Z5112 Encounter for antineoplastic immunotherapy: Secondary | ICD-10-CM

## 2023-02-03 DIAGNOSIS — E876 Hypokalemia: Secondary | ICD-10-CM | POA: Diagnosis not present

## 2023-02-03 DIAGNOSIS — R911 Solitary pulmonary nodule: Secondary | ICD-10-CM | POA: Diagnosis not present

## 2023-02-03 DIAGNOSIS — D508 Other iron deficiency anemias: Secondary | ICD-10-CM

## 2023-02-03 DIAGNOSIS — R053 Chronic cough: Secondary | ICD-10-CM | POA: Diagnosis not present

## 2023-02-03 DIAGNOSIS — D509 Iron deficiency anemia, unspecified: Secondary | ICD-10-CM | POA: Insufficient documentation

## 2023-02-03 DIAGNOSIS — Z79620 Long term (current) use of immunosuppressive biologic: Secondary | ICD-10-CM | POA: Diagnosis not present

## 2023-02-03 DIAGNOSIS — I2699 Other pulmonary embolism without acute cor pulmonale: Secondary | ICD-10-CM | POA: Diagnosis not present

## 2023-02-03 LAB — CBC WITH DIFFERENTIAL (CANCER CENTER ONLY)
Abs Immature Granulocytes: 0.01 10*3/uL (ref 0.00–0.07)
Basophils Absolute: 0 10*3/uL (ref 0.0–0.1)
Basophils Relative: 1 %
Eosinophils Absolute: 0.2 10*3/uL (ref 0.0–0.5)
Eosinophils Relative: 4 %
HCT: 37.4 % — ABNORMAL LOW (ref 39.0–52.0)
Hemoglobin: 11.9 g/dL — ABNORMAL LOW (ref 13.0–17.0)
Immature Granulocytes: 0 %
Lymphocytes Relative: 26 %
Lymphs Abs: 1.1 10*3/uL (ref 0.7–4.0)
MCH: 25.5 pg — ABNORMAL LOW (ref 26.0–34.0)
MCHC: 31.8 g/dL (ref 30.0–36.0)
MCV: 80.3 fL (ref 80.0–100.0)
Monocytes Absolute: 0.4 10*3/uL (ref 0.1–1.0)
Monocytes Relative: 9 %
Neutro Abs: 2.6 10*3/uL (ref 1.7–7.7)
Neutrophils Relative %: 60 %
Platelet Count: 347 10*3/uL (ref 150–400)
RBC: 4.66 MIL/uL (ref 4.22–5.81)
RDW: 17.2 % — ABNORMAL HIGH (ref 11.5–15.5)
WBC Count: 4.3 10*3/uL (ref 4.0–10.5)
nRBC: 0 % (ref 0.0–0.2)

## 2023-02-03 LAB — CMP (CANCER CENTER ONLY)
ALT: 16 U/L (ref 0–44)
AST: 20 U/L (ref 15–41)
Albumin: 3.8 g/dL (ref 3.5–5.0)
Alkaline Phosphatase: 74 U/L (ref 38–126)
Anion gap: 10 (ref 5–15)
BUN: 8 mg/dL (ref 6–20)
CO2: 24 mmol/L (ref 22–32)
Calcium: 9.2 mg/dL (ref 8.9–10.3)
Chloride: 103 mmol/L (ref 98–111)
Creatinine: 0.99 mg/dL (ref 0.61–1.24)
GFR, Estimated: >60 mL/min (ref >60)
Glucose, Bld: 152 mg/dL — ABNORMAL HIGH (ref 70–99)
Potassium: 3.5 mmol/L (ref 3.5–5.1)
Sodium: 137 mmol/L (ref 135–145)
Total Bilirubin: 1 mg/dL (ref 0.3–1.2)
Total Protein: 7.5 g/dL (ref 6.5–8.1)

## 2023-02-03 MED ORDER — HEPARIN SOD (PORK) LOCK FLUSH 100 UNIT/ML IV SOLN
500.0000 [IU] | Freq: Once | INTRAVENOUS | Status: AC | PRN
  Administered 2023-02-03: 500 [IU]
  Filled 2023-02-03: qty 5

## 2023-02-03 MED ORDER — SODIUM CHLORIDE 0.9 % IV SOLN
Freq: Once | INTRAVENOUS | Status: AC
  Filled 2023-02-03: qty 250

## 2023-02-03 MED ORDER — SODIUM CHLORIDE 0.9 % IV SOLN
200.0000 mg | Freq: Once | INTRAVENOUS | Status: AC
  Administered 2023-02-03: 200 mg via INTRAVENOUS
  Filled 2023-02-03: qty 200

## 2023-02-03 MED ORDER — SODIUM CHLORIDE 0.9 % IV SOLN
200.0000 mg | Freq: Once | INTRAVENOUS | Status: AC
  Administered 2023-02-03: 200 mg via INTRAVENOUS
  Filled 2023-02-03: qty 8

## 2023-02-03 NOTE — Progress Notes (Signed)
Patient is struggling with nausea and vomiting.
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
## 2023-02-03 NOTE — Patient Instructions (Signed)
[facility]  Discharge Instructions: Thank you for choosing [facility] to provide your oncology and hematology care.  If you have a lab appointment with the Cancer Center, please go directly to the Cancer Center and check in at the registration area.  Wear comfortable clothing and clothing appropriate for easy access to any Portacath or PICC line.   We strive to give you quality time with your provider. You may need to reschedule your appointment if you arrive late (15 or more minutes).  Arriving late affects you and other patients whose appointments are after yours.  Also, if you miss three or more appointments without notifying the office, you may be dismissed from the clinic at the provider's discretion.      For prescription refill requests, have your pharmacy contact our office and allow 72 hours for refills to be completed.    Today you received the following chemotherapy and/or immunotherapy agents- Keytruda      To help prevent nausea and vomiting after your treatment, we encourage you to take your nausea medication as directed.  BELOW ARE SYMPTOMS THAT SHOULD BE REPORTED IMMEDIATELY: *FEVER GREATER THAN 100.4 F (38 C) OR HIGHER *CHILLS OR SWEATING *NAUSEA AND VOMITING THAT IS NOT CONTROLLED WITH YOUR NAUSEA MEDICATION *UNUSUAL SHORTNESS OF BREATH *UNUSUAL BRUISING OR BLEEDING *URINARY PROBLEMS (pain or burning when urinating, or frequent urination) *BOWEL PROBLEMS (unusual diarrhea, constipation, pain near the anus) TENDERNESS IN MOUTH AND THROAT WITH OR WITHOUT PRESENCE OF ULCERS (sore throat, sores in mouth, or a toothache) UNUSUAL RASH, SWELLING OR PAIN  UNUSUAL VAGINAL DISCHARGE OR ITCHING   Items with * indicate a potential emergency and should be followed up as soon as possible or go to the Emergency Department if any problems should occur.  Please show the CHEMOTHERAPY ALERT CARD or IMMUNOTHERAPY ALERT CARD at check-in to  the Emergency Department and triage nurse.  Should you have questions after your visit or need to cancel or reschedule your appointment, please contact [facility]  [phone] and follow the prompts.  Office hours are 8:00 a.m. to 4:30 p.m. Monday - Friday. Please note that voicemails left after 4:00 p.m. may not be returned until the following business day.  We are closed weekends and major holidays. You have access to a nurse at all times for urgent questions. Please call the main number to the clinic [phone] and follow the prompts.  For any non-urgent questions, you may also contact your provider using MyChart. We now offer e-Visits for anyone 18 and older to request care online for non-urgent symptoms. For details visit [email or web address].   Also download the MyChart app! Go to the app store, search "MyChart", open the app, select [facility], and log in with your MyChart username and password.   

## 2023-02-07 ENCOUNTER — Encounter: Payer: Self-pay | Admitting: Internal Medicine

## 2023-02-07 ENCOUNTER — Encounter: Payer: Self-pay | Admitting: Endocrinology

## 2023-02-07 ENCOUNTER — Ambulatory Visit: Payer: Federal, State, Local not specified - PPO | Admitting: Endocrinology

## 2023-02-07 VITALS — BP 120/75 | HR 92 | Ht 70.0 in | Wt 152.0 lb

## 2023-02-07 DIAGNOSIS — Z7984 Long term (current) use of oral hypoglycemic drugs: Secondary | ICD-10-CM | POA: Diagnosis not present

## 2023-02-07 DIAGNOSIS — E119 Type 2 diabetes mellitus without complications: Secondary | ICD-10-CM
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
## 2023-02-08 ENCOUNTER — Encounter: Payer: Self-pay | Admitting: Internal Medicine

## 2023-02-08 DIAGNOSIS — K080 Exfoliation of teeth due to systemic causes: Secondary | ICD-10-CM | POA: Diagnosis not present

## 2023-02-10 ENCOUNTER — Inpatient Hospital Stay: Payer: Federal, State, Local not specified - PPO

## 2023-02-10 VITALS — BP 111/73 | HR 73 | Temp 97.5°F | Resp 16

## 2023-02-10 DIAGNOSIS — Z5112 Encounter for antineoplastic immunotherapy: Secondary | ICD-10-CM | POA: Diagnosis not present

## 2023-02-10 DIAGNOSIS — C642 Malignant neoplasm of left kidney, except renal pelvis: Secondary | ICD-10-CM | POA: Diagnosis not present

## 2023-02-10 DIAGNOSIS — E876 Hypokalemia: Secondary | ICD-10-CM | POA: Diagnosis not present

## 2023-02-10 DIAGNOSIS — D508 Other iron deficiency anemias: Secondary | ICD-10-CM

## 2023-02-10 DIAGNOSIS — D509 Iron deficiency anemia, unspecified: Secondary | ICD-10-CM | POA: Diagnosis not present

## 2023-02-10 DIAGNOSIS — Z7901 Long term (current) use of anticoagulants: Secondary | ICD-10-CM | POA: Diagnosis not present

## 2023-02-10 DIAGNOSIS — I2699 Other pulmonary embolism without acute cor pulmonale: Secondary | ICD-10-CM | POA: Diagnosis not present

## 2023-02-10 DIAGNOSIS — R053 Chronic cough: Secondary | ICD-10-CM | POA: Diagnosis not present

## 2023-02-10 DIAGNOSIS — R911 Solitary pulmonary nodule: Secondary | ICD-10-CM | POA: Diagnosis not present

## 2023-02-10 DIAGNOSIS — C7951 Secondary malignant neoplasm of bone: Secondary | ICD-10-CM | POA: Diagnosis not present

## 2023-02-10 MED ORDER — SODIUM CHLORIDE 0.9 % IV SOLN
200.0000 mg | Freq: Once | INTRAVENOUS | Status: AC
  Administered 2023-02-10: 200 mg via INTRAVENOUS
  Filled 2023-02-10: qty 200

## 2023-02-10 MED ORDER — HEPARIN SOD (PORK) LOCK FLUSH 100 UNIT/ML IV SOLN
500.0000 [IU] | Freq: Once | INTRAVENOUS | Status: AC | PRN
  Administered 2023-02-10: 500 [IU]
  Filled 2023-02-10: qty 5

## 2023-02-10 MED ORDER — SODIUM CHLORIDE 0.9 % IV SOLN
Freq: Once | INTRAVENOUS | Status: AC
  Filled 2023-02-10: qty 250

## 2023-02-10 MED ORDER — HEPARIN SOD (PORK) LOCK FLUSH 100 UNIT/ML IV SOLN
INTRAVENOUS | Status: AC
  Filled 2023-02-10: qty 5

## 2023-02-16 MED FILL — Iron Sucrose Inj 20 MG/ML (Fe Equiv): INTRAVENOUS | Qty: 10 | Status: AC

## 2023-02-17 ENCOUNTER — Inpatient Hospital Stay: Payer: Federal, State, Local not specified - PPO

## 2023-02-17 ENCOUNTER — Encounter: Payer: Self-pay | Admitting: Internal Medicine

## 2023-02-17 ENCOUNTER — Inpatient Hospital Stay (HOSPITAL_BASED_OUTPATIENT_CLINIC_OR_DEPARTMENT_OTHER): Payer: Federal, State, Local not specified - PPO | Admitting: Internal Medicine

## 2023-02-17 VITALS — BP 115/86 | HR 77 | Temp 98.2°F | Ht 70.0 in | Wt 151.8 lb

## 2023-02-17 VITALS — BP 118/77 | HR 68 | Temp 97.9°F | Resp 17

## 2023-02-17 DIAGNOSIS — D509 Iron deficiency anemia, unspecified: Secondary | ICD-10-CM | POA: Diagnosis not present

## 2023-02-17 DIAGNOSIS — I2699 Other pulmonary embolism without acute cor pulmonale: Secondary | ICD-10-CM | POA: Diagnosis not present

## 2023-02-17 DIAGNOSIS — C642 Malignant neoplasm of left kidney, except renal pelvis: Secondary | ICD-10-CM

## 2023-02-17 DIAGNOSIS — C7951 Secondary malignant neoplasm of bone: Secondary | ICD-10-CM

## 2023-02-17 DIAGNOSIS — Z7901 Long term (current) use of anticoagulants: Secondary | ICD-10-CM | POA: Diagnosis not present

## 2023-02-17 DIAGNOSIS — R053 Chronic cough: Secondary | ICD-10-CM | POA: Diagnosis not present

## 2023-02-17 DIAGNOSIS — Z5112 Encounter for antineoplastic immunotherapy: Secondary | ICD-10-CM | POA: Diagnosis not present

## 2023-02-17 DIAGNOSIS — D508 Other iron deficiency anemias: Secondary | ICD-10-CM

## 2023-02-17 DIAGNOSIS — E876 Hypokalemia: Secondary | ICD-10-CM | POA: Diagnosis not present

## 2023-02-17 DIAGNOSIS — R911 Solitary pulmonary nodule: Secondary | ICD-10-CM | POA: Diagnosis not present

## 2023-02-17 MED ORDER — HEPARIN SOD (PORK) LOCK FLUSH 100 UNIT/ML IV SOLN
500.0000 [IU] | Freq: Once | INTRAVENOUS | Status: AC | PRN
  Administered 2023-02-17: 500 [IU]
  Filled 2023-02-17: qty 5

## 2023-02-17 MED ORDER — SODIUM CHLORIDE 0.9 % IV SOLN
200.0000 mg | Freq: Once | INTRAVENOUS | Status: AC
  Administered 2023-02-17: 200 mg via INTRAVENOUS
  Filled 2023-02-17: qty 200

## 2023-02-17 MED ORDER — SODIUM CHLORIDE 0.9 % IV SOLN
Freq: Once | INTRAVENOUS | Status: AC
  Filled 2023-02-17: qty 250

## 2023-02-17 NOTE — Progress Notes (Signed)
DISCONTINUE ON PATHWAY REGIMEN - Renal Cell     A cycle is every 21 days:     Pembrolizumab   **Always confirm dose/schedule in your pharmacy ordering system**  REASON: Disease Progression PRIOR TREATMENT: [id number]: Pembrolizumab 200 mg q21 Days for up to 1 Year TREATMENT RESPONSE: Progressive Disease (PD)  START OFF PATHWAY REGIMEN - Renal Cell   [id number] (Cabometyx) 40 mg PO Daily D1-28 + Nivolumab 240 mg IV D1,15 q28 Days:   A cycle is every 28 days:     Cabozantinib (tablet)      Nivolumab   **Always confirm dose/schedule in your pharmacy ordering system**  Patient Characteristics: 

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
## 2023-02-21 ENCOUNTER — Encounter: Payer: Self-pay | Admitting: Internal Medicine

## 2023-02-22 ENCOUNTER — Ambulatory Visit: Payer: Federal, State, Local not specified - PPO | Admitting: Internal Medicine

## 2023-02-22 ENCOUNTER — Other Ambulatory Visit: Payer: Self-pay | Admitting: *Deleted

## 2023-02-22 ENCOUNTER — Encounter: Payer: Self-pay | Admitting: Internal Medicine

## 2023-02-22 VITALS — BP 126/86 | HR 78 | Temp 98.4°F | Resp 16 | Ht 70.0 in | Wt 153.0 lb

## 2023-02-22 DIAGNOSIS — Z0000 Encounter for general adult medical examination without abnormal findings: Secondary | ICD-10-CM | POA: Diagnosis not present

## 2023-02-22 DIAGNOSIS — E119 Type 2 diabetes mellitus without complications: Secondary | ICD-10-CM

## 2023-02-22 DIAGNOSIS — I152 Hypertension secondary to endocrine disorders: Secondary | ICD-10-CM | POA: Diagnosis not present

## 2023-02-22 DIAGNOSIS — Z7984 Long term (current) use of oral hypoglycemic drugs: Secondary | ICD-10-CM

## 2023-02-22 DIAGNOSIS — Z23 Encounter for immunization: Secondary | ICD-10-CM

## 2023-02-22 DIAGNOSIS — E785 Hyperlipidemia, unspecified: Secondary | ICD-10-CM | POA: Diagnosis not present

## 2023-02-22 DIAGNOSIS — Z0001 Encounter for general adult medical examination with abnormal findings: Secondary | ICD-10-CM

## 2023-02-22 DIAGNOSIS — E1169 Type 2 diabetes mellitus with other specified complication: Secondary | ICD-10-CM

## 2023-02-22 DIAGNOSIS — E1159 Type 2 diabetes mellitus with other circulatory complications: Secondary | ICD-10-CM

## 2023-02-22 LAB — URINALYSIS, ROUTINE W REFLEX MICROSCOPIC
Bilirubin Urine: NEGATIVE
Ketones, ur: NEGATIVE
Leukocytes,Ua: NEGATIVE
Nitrite: NEGATIVE
Specific Gravity, Urine: 1.02 (ref 1.000–1.030)
Total Protein, Urine: NEGATIVE
Urine Glucose: >=1000 — AB
Urobilinogen, UA: 0.2 (ref 0.0–1.0)
pH: 6 (ref 5.0–8.0)

## 2023-02-22 LAB — MICROALBUMIN / CREATININE URINE RATIO
Creatinine,U: 112.7 mg/dL
Microalb Creat Ratio: 0.6 mg/g (ref 0.0–30.0)
Microalb, Ur: 0.7 mg/dL (ref 0.0–1.9)

## 2023-02-22 LAB — LIPID PANEL
Cholesterol: 132 mg/dL (ref 0–200)
HDL: 53.1 mg/dL (ref >39.00)
LDL Cholesterol: 58 mg/dL (ref 0–99)
NonHDL: 78.96
Total CHOL/HDL Ratio: 2
Triglycerides: 105 mg/dL (ref 0.0–149.0)
VLDL: 21 mg/dL (ref 0.0–40.0)

## 2023-02-22 NOTE — Progress Notes (Signed)
Subjective:  Patient ID: Paul Flowers, male    DOB: 1971-04-24  Age: 52 y.o. MRN: [id number]  CC: Anemia, Annual Exam, Diabetes, and Hyperlipidemia   HPI Paul Flowers presents for a CPX and f/up ----   He is being treated for renal cell carcinoma with mets.  He has musculoskeletal pain but he is active and denies chest pain, shortness of breath, diaphoresis, or edema.   Outpatient Medications Prior to Visit  Medication Sig Dispense Refill   apixaban (ELIQUIS) 5 MG TABS tablet Take 1 tablet (5 mg total) by mouth 2 (two) times daily. 60 tablet 3   atorvastatin (LIPITOR) 20 MG tablet TAKE 1 TABLET BY MOUTH EVERY DAY 90 tablet 3   docusate sodium (COLACE) 100 MG capsule Take 1 capsule (100 mg total) by mouth 2 (two) times daily.     fluticasone (FLONASE) 50 MCG/ACT nasal spray Place 1 spray into both nostrils daily. 18.2 mL 2   lidocaine-prilocaine (EMLA) cream Apply 1 Application topically as needed. Apply to port 1 hour prior to use. Cover with plastic wrap. 30 g 3   metoprolol tartrate (LOPRESSOR) 25 MG tablet Take 1 tablet (25 mg total) by mouth 2 (two) times daily. 60 tablet 3   montelukast (SINGULAIR) 10 MG tablet TAKE 1 TABLET BY MOUTH EVERYDAY AT BEDTIME 90 tablet 3   omeprazole (PRILOSEC) 20 MG capsule TAKE 1 CAPSULE BY MOUTH EVERY DAY 90 capsule 1   ondansetron (ZOFRAN) 8 MG tablet Take 1 tablet (8 mg total) by mouth every 8 (eight) hours as needed for nausea or vomiting. 30 tablet 2   ONE TOUCH LANCETS MISC Use to test blood sugar once daily 200 each 2   XIGDUO XR 11-998 MG TB24 TAKE 1 TABLET BY MOUTH EVERY DAY 90 tablet 1   HYDROcodone-acetaminophen (NORCO) 5-325 MG tablet Take 1-2 tablets by mouth every 6 (six) hours as needed for moderate pain or severe pain. 20 tablet 0   potassium chloride SA (KLOR-CON M) 20 MEQ tablet Take 1 tablet (20 mEq total) by mouth daily for 7 days. 7 tablet 0   No facility-administered medications prior to visit.    ROS Review of Systems   Constitutional:  Positive for fatigue and unexpected weight change (wt loss). Negative for appetite change, chills and diaphoresis.  Respiratory: Negative.  Negative for chest tightness and wheezing.   Cardiovascular:  Negative for chest pain, palpitations and leg swelling.  Gastrointestinal:  Positive for nausea. Negative for abdominal pain and constipation.  Genitourinary: Negative.   Musculoskeletal:  Positive for arthralgias. Negative for myalgias.  Skin: Negative.   Neurological: Negative.   Hematological:  Negative for adenopathy. Does not bruise/bleed easily.  Psychiatric/Behavioral: Negative.      Objective:  BP 126/86 (BP Location: Left Arm, Patient Position: Sitting, Cuff Size: Normal)   Pulse 78   Temp 98.4 F (36.9 C) (Oral)   Resp 16   Ht 5\' 10"  (1.778 m)   Wt 153 lb (69.4 kg)   SpO2 97%   BMI 21.95 kg/m   BP Readings from Last 3 Encounters:  02/24/23 126/71  02/24/23 133/77  02/22/23 126/86    Wt Readings from Last 3 Encounters:  02/24/23 152 lb 8 oz (69.2 kg)  02/22/23 153 lb (69.4 kg)  02/17/23 151 lb 12.8 oz (68.9 kg)    Physical Exam Vitals reviewed.  Constitutional:      General: He is not in acute distress.    Appearance: He is not ill-appearing, toxic-appearing  or diaphoretic.  HENT:     Mouth/Throat:     Mouth: Mucous membranes are moist.  Eyes:     General: No scleral icterus.    Conjunctiva/sclera: Conjunctivae normal.  Cardiovascular:     Rate and Rhythm: Normal rate and regular rhythm.     Pulses: Normal pulses.     Heart sounds: No murmur heard.    No friction rub. No gallop.  Pulmonary:     Effort: Pulmonary effort is normal.     Breath sounds: No stridor. No wheezing, rhonchi or rales.  Abdominal:     General: Abdomen is flat.     Palpations: There is no mass.     Tenderness: There is no abdominal tenderness. There is no guarding.     Hernia: No hernia is present. There is no hernia in the left inguinal area or right inguinal  area.  Genitourinary:    Pubic Area: No rash.      Penis: Normal and circumcised.      Testes: Normal.     Epididymis:     Right: Normal.     Left: Normal.     Prostate: Enlarged. Not tender and no nodules present.     Rectum: Normal. Guaiac result negative. No mass, tenderness, anal fissure, external hemorrhoid or internal hemorrhoid. Normal anal tone.  Musculoskeletal:        General: Normal range of motion.     Cervical back: Neck supple.     Right lower leg: No edema.     Left lower leg: No edema.  Lymphadenopathy:     Cervical: No cervical adenopathy.     Lower Body: No right inguinal adenopathy. No left inguinal adenopathy.  Skin:    General: Skin is warm and dry.  Neurological:     General: No focal deficit present.     Mental Status: He is alert. Mental status is at baseline.  Psychiatric:        Mood and Affect: Mood normal.        Behavior: Behavior normal.     Lab Results  Component Value Date   WBC 5.2 02/24/2023   HGB 12.5 (L) 02/24/2023   HCT 38.5 (L) 02/24/2023   PLT 229 02/24/2023   GLUCOSE 152 (H) 02/24/2023   CHOL 132 02/22/2023   TRIG 105.0 02/22/2023   HDL 53.10 02/22/2023   LDLDIRECT 149.3 02/12/2013   LDLCALC 58 02/22/2023   ALT 16 02/24/2023   AST 21 02/24/2023   NA 137 02/24/2023   K 3.2 (L) 02/24/2023   CL 104 02/24/2023   CREATININE 0.95 02/24/2023   BUN 6 02/24/2023   CO2 24 02/24/2023   TSH 0.434 02/24/2023   PSA 0.34 01/28/2022   INR 1.0 12/07/2013   HGBA1C 5.7 02/01/2023   MICROALBUR 0.7 02/22/2023    IR IMAGING GUIDED PORT INSERTION  Result Date: 01/17/2023 INDICATION: renal cell cancer EXAM: Chest port placement using ultrasound and fluoroscopic guidance MEDICATIONS: Documented in the EMR ANESTHESIA/SEDATION: Moderate (conscious) sedation was employed during this procedure. A total of Versed 3 mg and Fentanyl 100 mcg was administered intravenously. Moderate Sedation Time: 28 minutes. The patient's level of consciousness and  vital signs were monitored continuously by radiology nursing throughout the procedure under my direct supervision. FLUOROSCOPY TIME:  Fluoroscopy Time: 0.4 minutes (2 mGy) COMPLICATIONS: None immediate. PROCEDURE: Informed written consent was obtained from the patient after a thorough discussion of the procedural risks, benefits and alternatives. All questions were addressed. Maximal Sterile Barrier Technique  was utilized including caps, mask, sterile gowns, sterile gloves, sterile drape, hand hygiene and skin antiseptic. A timeout was performed prior to the initiation of the procedure. The patient was placed supine on the exam table. The right neck and chest was prepped and draped in the standard sterile fashion. A preliminary ultrasound of the right neck was performed and demonstrates a patent right internal jugular vein. A permanent ultrasound image was stored in the electronic medical record. The overlying skin was anesthetized with 1% Lidocaine. Using ultrasound guidance, access was obtained into the right internal jugular vein using a 21 gauge micropuncture set. A wire was advanced into the SVC, a short incision was made at the puncture site, and serial dilatation performed. Next, in an ipsilateral infraclavicular location, an incision was made at the site of the subcutaneous reservoir. Blunt dissection was used to open a pocket to contain the reservoir. A subcutaneous tunnel was then created from the port site to the puncture site. A(n) 8 Fr single lumen catheter was advanced through the tunnel. The catheter was attached to the port and this was placed in the subcutaneous pocket. Under fluoroscopic guidance, a peel away sheath was placed, and the catheter was trimmed to the appropriate length and was advanced into the central veins. The catheter length is 22 cm. The tip of the catheter lies near the superior cavoatrial junction. The port flushes and aspirates appropriately. The port was flushed and locked  with heparinized saline. The port pocket was closed in 2 layers using 3-0 and 4-0 Vicryl/absorbable suture. Dermabond was also applied to both incisions. The patient tolerated the procedure well and was transferred to recovery in stable condition. IMPRESSION: Successful placement of a right-sided chest port via the right internal jugular vein. The port is ready for immediate use. Electronically Signed   By: Olive Bass M.D.   On: 01/17/2023 14:33    Assessment & Plan:   Hypertension associated with diabetes (HCC)- His blood pressure is well-controlled. -     Urinalysis, Routine w reflex microscopic; Future  Hyperlipidemia associated with type 2 diabetes mellitus (HCC)- LDL goal achieved. Doing well on the statin  -     Lipoprotein A (LPA); Future -     Lipid panel; Future  Controlled type 2 diabetes mellitus without complication, without long-term current use of insulin (HCC)- His blood sugar is very well controlled. -     Microalbumin / creatinine urine ratio; Future -     Urinalysis, Routine w reflex microscopic; Future  Encounter for general adult medical examination with abnormal findings- Exam completed, labs reviewed, vaccines reviewed and updated, cancer screenings addressed, pt ed material was given.  -     PSA; Future  Other orders -     Varicella-zoster vaccine IM -     Tdap vaccine greater than or equal to 7yo IM     Follow-up: Return in about 6 months (around 08/25/2023).  Sanda Linger, MD

## 2023-02-22 NOTE — Patient Instructions (Signed)
Health Maintenance, Male Adopting a healthy lifestyle and getting preventive care are important in promoting health and wellness. Ask your health care provider about: The right schedule for you to have regular tests and exams. Things you can do on your own to prevent diseases and keep yourself healthy. What should I know about diet, weight, and exercise? Eat a healthy diet  Eat a diet that includes plenty of vegetables, fruits, low-fat dairy products, and lean protein. Do not eat a lot of foods that are high in solid fats, added sugars, or sodium. Maintain a healthy weight Body mass index (BMI) is a measurement that can be used to identify possible weight problems. It estimates body fat based on height and weight. Your health care provider can help determine your BMI and help you achieve or maintain a healthy weight. Get regular exercise Get regular exercise. This is one of the most important things you can do for your health. Most adults should: Exercise for at least 150 minutes each week. The exercise should increase your heart rate and make you sweat (moderate-intensity exercise). Do strengthening exercises at least twice a week. This is in addition to the moderate-intensity exercise. Spend less time sitting. Even light physical activity can be beneficial. Watch cholesterol and blood lipids Have your blood tested for lipids and cholesterol at 54 years of age, then have this test every 5 years. You may need to have your cholesterol levels checked more often if: Your lipid or cholesterol levels are high. You are older than 54 years of age. You are at high risk for heart disease. What should I know about cancer screening? Many types of cancers can be detected early and may often be prevented. Depending on your health history and family history, you may need to have cancer screening at various ages. This may include screening for: Colorectal cancer. Prostate cancer. Skin cancer. Lung  cancer. What should I know about heart disease, diabetes, and high blood pressure? Blood pressure and heart disease High blood pressure causes heart disease and increases the risk of stroke. This is more likely to develop in people who have high blood pressure readings or are overweight. Talk with your health care provider about your target blood pressure readings. Have your blood pressure checked: Every 3-5 years if you are 18-39 years of age. Every year if you are 40 years old or older. If you are between the ages of 65 and 75 and are a current or former smoker, ask your health care provider if you should have a one-time screening for abdominal aortic aneurysm (AAA). Diabetes Have regular diabetes screenings. This checks your fasting blood sugar level. Have the screening done: Once every three years after age 45 if you are at a normal weight and have a low risk for diabetes. More often and at a younger age if you are overweight or have a high risk for diabetes. What should I know about preventing infection? Hepatitis B If you have a higher risk for hepatitis B, you should be screened for this virus. Talk with your health care provider to find out if you are at risk for hepatitis B infection. Hepatitis C Blood testing is recommended for: Everyone born from 1945 through 1965. Anyone with known risk factors for hepatitis C. Sexually transmitted infections (STIs) You should be screened each year for STIs, including gonorrhea and chlamydia, if: You are sexually active and are younger than 54 years of age. You are older than 54 years of age and your   health care provider tells you that you are at risk for this type of infection. Your sexual activity has changed since you were last screened, and you are at increased risk for chlamydia or gonorrhea. Ask your health care provider if you are at risk. Ask your health care provider about whether you are at high risk for HIV. Your health care provider  may recommend a prescription medicine to help prevent HIV infection. If you choose to take medicine to prevent HIV, you should first get tested for HIV. You should then be tested every 3 months for as long as you are taking the medicine. Follow these instructions at home: Alcohol use Do not drink alcohol if your health care provider tells you not to drink. If you drink alcohol: Limit how much you have to 0-2 drinks a day. Know how much alcohol is in your drink. In the [place] one drink equals one 12 oz bottle of beer (355 mL), one 5 oz glass of wine (148 mL), or one 1 oz glass of hard liquor (44 mL). Lifestyle Do not use any products that contain nicotine or tobacco. These products include cigarettes, chewing tobacco, and vaping devices, such as e-cigarettes. If you need help quitting, ask your health care provider. Do not use street drugs. Do not share needles. Ask your health care provider for help if you need support or information about quitting drugs. General instructions Schedule regular health, dental, and eye exams. Stay current with your vaccines. Tell your health care provider if: You often feel depressed. You have ever been abused or do not feel safe at home. Summary Adopting a healthy lifestyle and getting preventive care are important in promoting health and wellness. Follow your health care provider's instructions about healthy diet, exercising, and getting tested or screened for diseases. Follow your health care provider's instructions on monitoring your cholesterol and blood pressure. This information is not intended to replace advice given to you by your health care provider. Make sure you discuss any questions you have with your health care provider. Document Revised: 11/10/2020 Document Reviewed: 11/10/2020 [employer] Patient Education  2024 [employer]  

## 2023-02-23 ENCOUNTER — Other Ambulatory Visit: Payer: Self-pay | Admitting: Nurse Practitioner

## 2023-02-23 ENCOUNTER — Other Ambulatory Visit: Payer: Self-pay

## 2023-02-23 MED ORDER — HYDROCODONE-ACETAMINOPHEN 5-325 MG PO TABS: 1.0000 | ORAL_TABLET | Freq: Four times a day (QID) | ORAL | 0 refills | Status: DC | PRN

## 2023-02-24 ENCOUNTER — Inpatient Hospital Stay: Payer: Federal, State, Local not specified - PPO

## 2023-02-24 ENCOUNTER — Encounter: Payer: Self-pay | Admitting: Internal Medicine

## 2023-02-24 ENCOUNTER — Ambulatory Visit: Payer: Federal, State, Local not specified - PPO | Admitting: Internal Medicine

## 2023-02-24 ENCOUNTER — Other Ambulatory Visit: Payer: Federal, State, Local not specified - PPO

## 2023-02-24 ENCOUNTER — Inpatient Hospital Stay (HOSPITAL_BASED_OUTPATIENT_CLINIC_OR_DEPARTMENT_OTHER): Payer: Federal, State, Local not specified - PPO | Admitting: Internal Medicine

## 2023-02-24 ENCOUNTER — Ambulatory Visit: Payer: Federal, State, Local not specified - PPO

## 2023-02-24 VITALS — BP 133/77 | HR 86 | Temp 97.8°F | Resp 16 | Wt 152.5 lb

## 2023-02-24 VITALS — BP 126/71 | HR 65

## 2023-02-24 DIAGNOSIS — C7951 Secondary malignant neoplasm of bone: Secondary | ICD-10-CM | POA: Diagnosis not present

## 2023-02-24 DIAGNOSIS — C642 Malignant neoplasm of left kidney, except renal pelvis: Secondary | ICD-10-CM

## 2023-02-24 DIAGNOSIS — R911 Solitary pulmonary nodule: Secondary | ICD-10-CM | POA: Diagnosis not present

## 2023-02-24 DIAGNOSIS — E876 Hypokalemia: Secondary | ICD-10-CM | POA: Diagnosis not present

## 2023-02-24 DIAGNOSIS — Z7901 Long term (current) use of anticoagulants: Secondary | ICD-10-CM | POA: Diagnosis not present

## 2023-02-24 DIAGNOSIS — Z5112 Encounter for antineoplastic immunotherapy: Secondary | ICD-10-CM

## 2023-02-24 DIAGNOSIS — I2699 Other pulmonary embolism without acute cor pulmonale: Secondary | ICD-10-CM | POA: Diagnosis not present

## 2023-02-24 DIAGNOSIS — R053 Chronic cough: Secondary | ICD-10-CM | POA: Diagnosis not present

## 2023-02-24 DIAGNOSIS — D509 Iron deficiency anemia, unspecified: Secondary | ICD-10-CM | POA: Diagnosis not present

## 2023-02-24 LAB — CBC WITH DIFFERENTIAL (CANCER CENTER ONLY)
Abs Immature Granulocytes: 0.01 10*3/uL (ref 0.00–0.07)
Basophils Absolute: 0 10*3/uL (ref 0.0–0.1)
Basophils Relative: 1 %
Eosinophils Absolute: 0.2 10*3/uL (ref 0.0–0.5)
Eosinophils Relative: 4 %
HCT: 38.5 % — ABNORMAL LOW (ref 39.0–52.0)
Hemoglobin: 12.5 g/dL — ABNORMAL LOW (ref 13.0–17.0)
Immature Granulocytes: 0 %
Lymphocytes Relative: 20 %
Lymphs Abs: 1 10*3/uL (ref 0.7–4.0)
MCH: 26.5 pg (ref 26.0–34.0)
MCHC: 32.5 g/dL (ref 30.0–36.0)
MCV: 81.7 fL (ref 80.0–100.0)
Monocytes Absolute: 0.7 10*3/uL (ref 0.1–1.0)
Monocytes Relative: 13 %
Neutro Abs: 3.2 10*3/uL (ref 1.7–7.7)
Neutrophils Relative %: 62 %
Platelet Count: 229 10*3/uL (ref 150–400)
RBC: 4.71 MIL/uL (ref 4.22–5.81)
RDW: 16.6 % — ABNORMAL HIGH (ref 11.5–15.5)
WBC Count: 5.2 10*3/uL (ref 4.0–10.5)
nRBC: 0 % (ref 0.0–0.2)

## 2023-02-24 LAB — TSH: TSH: 0.434 u[IU]/mL (ref 0.350–4.500)

## 2023-02-24 LAB — CMP (CANCER CENTER ONLY)
ALT: 16 U/L (ref 0–44)
AST: 21 U/L (ref 15–41)
Albumin: 3.8 g/dL (ref 3.5–5.0)
Alkaline Phosphatase: 73 U/L (ref 38–126)
Anion gap: 9 (ref 5–15)
BUN: 6 mg/dL (ref 6–20)
CO2: 24 mmol/L (ref 22–32)
Calcium: 8.7 mg/dL — ABNORMAL LOW (ref 8.9–10.3)
Chloride: 104 mmol/L (ref 98–111)
Creatinine: 0.95 mg/dL (ref 0.61–1.24)
GFR, Estimated: >60 mL/min (ref >60)
Glucose, Bld: 152 mg/dL — ABNORMAL HIGH (ref 70–99)
Potassium: 3.2 mmol/L — ABNORMAL LOW (ref 3.5–5.1)
Sodium: 137 mmol/L (ref 135–145)
Total Bilirubin: 1 mg/dL (ref 0.3–1.2)
Total Protein: 7.2 g/dL (ref 6.5–8.1)

## 2023-02-24 MED ORDER — SODIUM CHLORIDE 0.9% FLUSH
10.0000 mL | INTRAVENOUS | Status: DC | PRN
  Filled 2023-02-24: qty 10

## 2023-02-24 MED ORDER — POTASSIUM CHLORIDE CRYS ER 20 MEQ PO TBCR: 20.0000 meq | EXTENDED_RELEASE_TABLET | Freq: Every day | ORAL | 0 refills | Status: DC

## 2023-02-24 MED ORDER — HEPARIN SOD (PORK) LOCK FLUSH 100 UNIT/ML IV SOLN
500.0000 [IU] | Freq: Once | INTRAVENOUS | Status: AC | PRN
  Administered 2023-02-24: 500 [IU]
  Filled 2023-02-24: qty 5

## 2023-02-24 MED ORDER — SODIUM CHLORIDE 0.9 % IV SOLN
1.0000 mg/kg | Freq: Once | INTRAVENOUS | Status: AC
  Administered 2023-02-24: 70 mg via INTRAVENOUS
  Filled 2023-02-24: qty 14

## 2023-02-24 MED ORDER — SODIUM CHLORIDE 0.9 % IV SOLN
Freq: Once | INTRAVENOUS | Status: AC
  Filled 2023-02-24: qty 250

## 2023-02-24 MED ORDER — SODIUM CHLORIDE 0.9 % IV SOLN
2.9000 mg/kg | Freq: Once | INTRAVENOUS | Status: AC
  Administered 2023-02-24: 200 mg via INTRAVENOUS
  Filled 2023-02-24: qty 20

## 2023-02-24 MED ORDER — FAMOTIDINE IN NACL 20-0.9 MG/50ML-% IV SOLN
20.0000 mg | Freq: Once | INTRAVENOUS | Status: AC
  Administered 2023-02-24: 20 mg via INTRAVENOUS
  Filled 2023-02-24: qty 50

## 2023-02-24 MED ORDER — DIPHENHYDRAMINE HCL 50 MG/ML IJ SOLN
25.0000 mg | Freq: Once | INTRAMUSCULAR | Status: AC
  Administered 2023-02-24: 25 mg via INTRAVENOUS
  Filled 2023-02-24: qty 1

## 2023-02-24 NOTE — Progress Notes (Signed)
Pt in for follow up, states has been more active and having some pain at times.
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
## 2023-02-24 NOTE — Patient Instructions (Signed)
[facility]  Discharge Instructions: Thank you for choosing [facility] to provide your oncology and hematology care.  If you have a lab appointment with the Cancer Center, please go directly to the Cancer Center and check in at the registration area.  Wear comfortable clothing and clothing appropriate for easy access to any Portacath or PICC line.   We strive to give you quality time with your provider. You may need to reschedule your appointment if you arrive late (15 or more minutes).  Arriving late affects you and other patients whose appointments are after yours.  Also, if you miss three or more appointments without notifying the office, you may be dismissed from the clinic at the provider's discretion.      For prescription refill requests, have your pharmacy contact our office and allow 72 hours for refills to be completed.    Today you received the following chemotherapy and/or immunotherapy agents- opdivo, yervoy      To help prevent nausea and vomiting after your treatment, we encourage you to take your nausea medication as directed.  BELOW ARE SYMPTOMS THAT SHOULD BE REPORTED IMMEDIATELY: *FEVER GREATER THAN 100.4 F (38 C) OR HIGHER *CHILLS OR SWEATING *NAUSEA AND VOMITING THAT IS NOT CONTROLLED WITH YOUR NAUSEA MEDICATION *UNUSUAL SHORTNESS OF BREATH *UNUSUAL BRUISING OR BLEEDING *URINARY PROBLEMS (pain or burning when urinating, or frequent urination) *BOWEL PROBLEMS (unusual diarrhea, constipation, pain near the anus) TENDERNESS IN MOUTH AND THROAT WITH OR WITHOUT PRESENCE OF ULCERS (sore throat, sores in mouth, or a toothache) UNUSUAL RASH, SWELLING OR PAIN  UNUSUAL VAGINAL DISCHARGE OR ITCHING   Items with * indicate a potential emergency and should be followed up as soon as possible or go to the Emergency Department if any problems should occur.  Please show the CHEMOTHERAPY ALERT CARD or IMMUNOTHERAPY ALERT CARD at  check-in to the Emergency Department and triage nurse.  Should you have questions after your visit or need to cancel or reschedule your appointment, please contact [place] [facility] [facility]  [phone] and follow the prompts.  Office hours are 8:00 a.m. to 4:30 p.m. Monday - Friday. Please note that voicemails left after 4:00 p.m. may not be returned until the following business day.  We are closed weekends and major holidays. You have access to a nurse at all times for urgent questions. Please call the main number to the clinic [phone] and follow the prompts.  For any non-urgent questions, you may also contact your provider using MyChart. We now offer e-Visits for anyone 59 and older to request care online for non-urgent symptoms. For details visit [email or web address].   Also download the MyChart app! Go to the app store, search "MyChart", open the app, select [place], and log in with your MyChart username and password.  Nivolumab Injection What is this medication? NIVOLUMAB (nye VOL ue mab) treats some types of cancer. It works by helping your immune system slow or stop the spread of cancer cells. It is a monoclonal antibody. This medicine may be used for other purposes; ask your health care provider or pharmacist if you have questions. COMMON BRAND NAME(S): Opdivo What should I tell my care team before I take this medication? They need to know if you have any of these conditions: Allogeneic stem cell transplant (uses someone else's stem cells) Autoimmune diseases, such as Crohn disease, ulcerative colitis, lupus History of chest radiation Nervous system problems, such as Guillain-Barre syndrome or myasthenia  gravis Organ transplant An unusual or allergic reaction to nivolumab, other medications, foods, dyes, or preservatives Pregnant or trying to get pregnant Breast-feeding How should I use this medication? This medication is infused into a vein. It is  given in a hospital or clinic setting. A special MedGuide will be given to you before each treatment. Be sure to read this information carefully each time. Talk to your care team about the use of this medication in children. While it may be prescribed for children as young as 12 years for selected conditions, precautions do apply. Overdosage: If you think you have taken too much of this medicine contact a poison control center or emergency room at once. NOTE: This medicine is only for you. Do not share this medicine with others. What if I miss a dose? Keep appointments for follow-up doses. It is important not to miss your dose. Call your care team if you are unable to keep an appointment. What may interact with this medication? Interactions have not been studied. This list may not describe all possible interactions. Give your health care provider a list of all the medicines, herbs, non-prescription drugs, or dietary supplements you use. Also tell them if you smoke, drink alcohol, or use illegal drugs. Some items may interact with your medicine. What should I watch for while using this medication? Your condition will be monitored carefully while you are receiving this medication. You may need blood work while taking this medication. This medication may cause serious skin reactions. They can happen weeks to months after starting the medication. Contact your care team right away if you notice fevers or flu-like symptoms with a rash. The rash may be red or purple and then turn into blisters or peeling of the skin. You may also notice a red rash with swelling of the face, lips, or lymph nodes in your neck or under your arms. Tell your care team right away if you have any change in your eyesight. Talk to your care team if you are pregnant or think you might be pregnant. A negative pregnancy test is required before starting this medication. A reliable form of contraception is recommended while taking this  medication and for 5 months after the last dose. Talk to your care team about effective forms of contraception. Do not breast-feed while taking this medication and for 5 months after the last dose. What side effects may I notice from receiving this medication? Side effects that you should report to your care team as soon as possible: Allergic reactions--skin rash, itching, hives, swelling of the face, lips, tongue, or throat Dry cough, shortness of breath or trouble breathing Eye pain, redness, irritation, or discharge with blurry or decreased vision Heart muscle inflammation--unusual weakness or fatigue, shortness of breath, chest pain, fast or irregular heartbeat, dizziness, swelling of the ankles, feet, or hands Hormone gland problems--headache, sensitivity to light, unusual weakness or fatigue, dizziness, fast or irregular heartbeat, increased sensitivity to cold or heat, excessive sweating, constipation, hair loss, increased thirst or amount of urine, tremors or shaking, irritability Infusion reactions--chest pain, shortness of breath or trouble breathing, feeling faint or lightheaded Kidney injury (glomerulonephritis)--decrease in the amount of urine, red or dark brown urine, foamy or bubbly urine, swelling of the ankles, hands, or feet Liver injury--right upper belly pain, loss of appetite, nausea, light-colored stool, dark yellow or brown urine, yellowing skin or eyes, unusual weakness or fatigue Pain, tingling, or numbness in the hands or feet, muscle weakness, change in vision, confusion or  trouble speaking, loss of balance or coordination, trouble walking, seizures Rash, fever, and swollen lymph nodes Redness, blistering, peeling, or loosening of the skin, including inside the mouth Sudden or severe stomach pain, bloody diarrhea, fever, nausea, vomiting Side effects that usually do not require medical attention (report these to your care team if they continue or are bothersome): Bone,  joint, or muscle pain Diarrhea Fatigue Loss of appetite Nausea Skin rash This list may not describe all possible side effects. Call your doctor for medical advice about side effects. You may report side effects to FDA at 1-800-FDA-1088. Where should I keep my medication? This medication is given in a hospital or clinic. It will not be stored at home. NOTE: This sheet is a summary. It may not cover all possible information. If you have questions about this medicine, talk to your doctor, pharmacist, or health care provider.  2024 Elsevier/Gold Standard (2021-10-19 00:00:00)  Ipilimumab Injection What is this medication? IPILIMUMAB (IP i LIM ue mab) treats some types of cancer. It works by helping your immune system slow or stop the spread of cancer cells. It is a monoclonal antibody. This medicine may be used for other purposes; ask your health care provider or pharmacist if you have questions. COMMON BRAND NAME(S): YERVOY What should I tell my care team before I take this medication? They need to know if you have any of these conditions: Allogeneic stem cell transplant (uses someone else's stem cells) Autoimmune diseases, such as Crohn disease, ulcerative colitis, lupus Nervous system problems, such as Guillain-Barre syndrome or myasthenia gravis Organ transplant An unusual or allergic reaction to ipilimumab, other medications, foods, dyes, or preservatives Pregnant or trying to get pregnant Breast-feeding How should I use this medication? This medication is infused into a vein. It is given by your care team in a hospital or clinic setting. A special MedGuide will be given to you before each treatment. Be sure to read this information carefully each time. Talk to your care team about the use of this medication in children. While it may be prescribed for children as young as 12 years for selected conditions, precautions do apply. Overdosage: If you think you have taken too much of this  medicine contact a poison control center or emergency room at once. NOTE: This medicine is only for you. Do not share this medicine with others. What if I miss a dose? Keep appointments for follow-up doses. It is important not to miss your dose. Call your care team if you are unable to keep an appointment. What may interact with this medication? Interactions are not expected. This list may not describe all possible interactions. Give your health care provider a list of all the medicines, herbs, non-prescription drugs, or dietary supplements you use. Also tell them if you smoke, drink alcohol, or use illegal drugs. Some items may interact with your medicine. What should I watch for while using this medication? Your condition will be monitored carefully while you are receiving this medication. You may need blood work while taking this medication. This medication may cause serious skin reactions. They can happen weeks to months after starting the medication. Contact your care team right away if you notice fevers or flu-like symptoms with a rash. The rash may be red or purple and then turn into blisters or peeling of the skin. You may also notice a red rash with swelling of the face, lips, or lymph nodes in your neck or under your arms. Tell your care team right  away if you have any change in your eyesight. Talk to your care team if you may be pregnant. Serious birth defects can occur if you take this medication during pregnancy and for 3 months after the last dose. You will need a negative pregnancy test before starting this medication. Contraception is recommended while taking this medication and for 3 months after the last dose. Your care team can help you find the option that works for you. Do not breastfeed while taking this medication and for 3 months after the last dose. What side effects may I notice from receiving this medication? Side effects that you should report to your care team as soon as  possible: Allergic reactions--skin rash, itching, hives, swelling of the face, lips, tongue, or throat Dry cough, shortness of breath or trouble breathing Eye pain, redness, irritation, or discharge with blurry or decreased vision Heart muscle inflammation--unusual weakness or fatigue, shortness of breath, chest pain, fast or irregular heartbeat, dizziness, swelling of the ankles, feet, or hands Hormone gland problems--headache, sensitivity to light, unusual weakness or fatigue, dizziness, fast or irregular heartbeat, increased sensitivity to cold or heat, excessive sweating, constipation, hair loss, increased thirst or amount of urine, tremors or shaking, irritability Infusion reactions--chest pain, shortness of breath or trouble breathing, feeling faint or lightheaded Kidney injury (glomerulonephritis)--decrease in the amount of urine, red or dark brown urine, foamy or bubbly urine, swelling of the ankles, hands, or feet Liver injury--right upper belly pain, loss of appetite, nausea, light-colored stool, dark yellow or brown urine, yellowing skin or eyes, unusual weakness or fatigue Pain, tingling, or numbness in the hands or feet, muscle weakness, change in vision, confusion or trouble speaking, loss of balance or coordination, trouble walking, seizures Rash, fever, and swollen lymph nodes Redness, blistering, peeling, or loosening of the skin, including inside the mouth Sudden or severe stomach pain, bloody diarrhea, fever, nausea, vomiting Side effects that usually do not require medical attention (report to your care team if they continue or are bothersome): Bone, joint, or muscle pain Diarrhea Fatigue Loss of appetite Nausea Skin rash This list may not describe all possible side effects. Call your doctor for medical advice about side effects. You may report side effects to FDA at 1-800-FDA-1088. Where should I keep my medication? This medication is given in a hospital or clinic. It will  not be stored at home. NOTE: This sheet is a summary. It may not cover all possible information. If you have questions about this medicine, talk to your doctor, pharmacist, or health care provider.  2024 Elsevier/Gold Standard (2021-11-06 00:00:00)

## 2023-02-24 NOTE — Progress Notes (Signed)
Ipilimumab (YERVOY) Patient Monitoring Assessment   Is the patient experiencing any of the following general symptoms?:  [ ] Difficulty performing normal activities [ ] Feeling sluggish or cold all the time [ ] Unusual weight gain [ ] Constant or unusual headaches [ ] Feeling dizzy or faint [ ] Changes in eyesight (blurry vision, double vision, or other vision problems) [ ] Changes in mood or behavior (ex: decreased sex drive, irritability, or forgetfulness) [ ] Starting new medications (ex: steroids, other medications that lower immune response) [ X]Patient is not experiencing any of the general symptoms above.   Gastrointestinal  Patient is having 1 bowel movements each day.  Is this different from baseline? [ ] Yes [ X]No Are your stools watery or do they have a foul smell? [ ] Yes [X ]No Have you seen blood in your stools? [ ] Yes [X ]No Are your stools dark, tarry, or sticky? [ ] Yes [X ]No Are you having pain or tenderness in your belly? [ ] Yes [X ]No  Skin Does your skin itch? [ ] Yes [X ]No Do you have a rash? [ ] Yes [ X]No Has your skin blistered and/or peeled? [ ] Yes [X ]No Do you have sores in your mouth? [ ] Yes [ X]No  Hepatic Has your urine been dark or tea colored? [ ] Yes [ X]No Have you noticed that your skin or the whites of your eyes are turning yellow? [ ] Yes [ X]No Are you bleeding or bruising more easily than normal? [ ] Yes [X ]No Are you nauseous and/or vomiting? [ ] Yes [ X]No Do you have pain on the right side of your stomach? [ ] Yes [X ]No  Neurologic  Are you having unusual weakness of legs, arms, or face? [ ] Yes [X ]No Are you having numbness or tingling in your hands or feet? [ ] Yes [X ]No  Robbie Lis

## 2023-02-25 LAB — T4: T4, Total: 8 ug/dL (ref 4.5–12.0)

## 2023-02-28 ENCOUNTER — Other Ambulatory Visit: Payer: Self-pay | Admitting: Family Medicine

## 2023-02-28 LAB — LIPOPROTEIN A (LPA): Lipoprotein (a): 48 nmol/L (ref <75)

## 2023-03-01 ENCOUNTER — Other Ambulatory Visit: Payer: Self-pay

## 2023-03-01 DIAGNOSIS — C7951 Secondary malignant neoplasm of bone: Secondary | ICD-10-CM

## 2023-03-01 MED ORDER — OXYCODONE-ACETAMINOPHEN 5-325 MG PO TABS
1.0000 | ORAL_TABLET | Freq: Three times a day (TID) | ORAL | 0 refills | Status: DC | PRN
Start: 2023-03-01 — End: 2023-07-25

## 2023-03-01 NOTE — Telephone Encounter (Signed)
Hydrocodone is on national back order and they do not have any, they do have percocet in stock if you want to do that instead.

## 2023-03-04 ENCOUNTER — Ambulatory Visit (HOSPITAL_COMMUNITY): Payer: Federal, State, Local not specified - PPO | Attending: Cardiology

## 2023-03-04 ENCOUNTER — Telehealth: Payer: Self-pay | Admitting: Physician Assistant

## 2023-03-04 ENCOUNTER — Encounter: Payer: Self-pay | Admitting: Internal Medicine

## 2023-03-04 DIAGNOSIS — I152 Hypertension secondary to endocrine disorders: Secondary | ICD-10-CM

## 2023-03-04 DIAGNOSIS — I519 Heart disease, unspecified: Secondary | ICD-10-CM | POA: Insufficient documentation

## 2023-03-04 DIAGNOSIS — E1159 Type 2 diabetes mellitus with other circulatory complications: Secondary | ICD-10-CM

## 2023-03-04 DIAGNOSIS — I351 Nonrheumatic aortic (valve) insufficiency: Secondary | ICD-10-CM

## 2023-03-04 DIAGNOSIS — Z79899 Other long term (current) drug therapy: Secondary | ICD-10-CM

## 2023-03-04 LAB — ECHOCARDIOGRAM LIMITED
Area-P 1/2: 3.37 cm2
Calc EF: 49.5 %
S' Lateral: 3.2 cm
Single Plane A2C EF: 51.9 %
Single Plane A4C EF: 50.8 %

## 2023-03-04 MED ORDER — LOSARTAN POTASSIUM 25 MG PO TABS: 12.5000 mg | ORAL_TABLET | Freq: Every day | ORAL | 3 refills | Status: DC

## 2023-03-04 NOTE — Telephone Encounter (Signed)
-----   Message from Paul Flowers sent at 03/04/2023 12:39 PM EDT ----- Results sent to [facility] via MyChart. See MyChart comments below. I will send a copy to [name], MD as Lorain Childes. PLAN:  -Start Losartan 12.5 mg once daily  -BMET 2 weeks.  Mr. Casaus  Your echocardiogram demonstrates stable heart function (ejection fraction).  I reviewed your chart and it looks as though your current blood pressures would likely tolerate an addition to your medications that would help with stabilization/improvement of your ejection fraction.  This medication is called in angiotensin receptor blocker (ARB) which is standard treatment for low ejection fraction.  I will start you on a very low-dose of the medication called losartan 12.5 mg daily and recheck labs to monitor kidney function in 2 weeks.   Paul Newcomer, PA-C

## 2023-03-04 NOTE — Telephone Encounter (Signed)
Patient verbalized understanding of echo results shared by [facility] via MyChart.  Losartan Rx sent to [facility]. BMET ordered and lab appt scheduled for 03/21/23. Patient verbalized understanding and expressed appreciation for call.

## 2023-03-09 ENCOUNTER — Encounter: Payer: Self-pay | Admitting: Internal Medicine

## 2023-03-09 DIAGNOSIS — C7951 Secondary malignant neoplasm of bone: Secondary | ICD-10-CM | POA: Diagnosis not present

## 2023-03-09 DIAGNOSIS — C649 Malignant neoplasm of unspecified kidney, except renal pelvis: Secondary | ICD-10-CM | POA: Diagnosis not present

## 2023-03-10 ENCOUNTER — Other Ambulatory Visit: Payer: Self-pay

## 2023-03-10 MED ORDER — STERILE WATER FOR INJECTION IJ SOLN
5.0000 mL | Freq: Four times a day (QID) | OROMUCOSAL | 3 refills | Status: DC
  Filled 2023-03-10: qty 480, 12d supply, fill #0
  Filled 2023-03-24: qty 480, 12d supply, fill #1
  Filled 2023-04-08: qty 480, 12d supply, fill #2
  Filled 2023-04-25: qty 480, 12d supply, fill #3

## 2023-03-16 DIAGNOSIS — C7951 Secondary malignant neoplasm of bone: Secondary | ICD-10-CM | POA: Diagnosis not present

## 2023-03-17 ENCOUNTER — Inpatient Hospital Stay: Payer: Federal, State, Local not specified - PPO | Attending: Internal Medicine

## 2023-03-17 ENCOUNTER — Inpatient Hospital Stay (HOSPITAL_BASED_OUTPATIENT_CLINIC_OR_DEPARTMENT_OTHER): Payer: Federal, State, Local not specified - PPO | Admitting: Internal Medicine

## 2023-03-17 ENCOUNTER — Inpatient Hospital Stay: Payer: Federal, State, Local not specified - PPO

## 2023-03-17 VITALS — BP 124/73 | HR 73 | Temp 96.1°F | Resp 18

## 2023-03-17 VITALS — BP 132/91 | HR 67 | Temp 98.1°F | Wt 155.0 lb

## 2023-03-17 DIAGNOSIS — Z79899 Other long term (current) drug therapy: Secondary | ICD-10-CM | POA: Diagnosis not present

## 2023-03-17 DIAGNOSIS — Z5112 Encounter for antineoplastic immunotherapy: Secondary | ICD-10-CM

## 2023-03-17 DIAGNOSIS — C7951 Secondary malignant neoplasm of bone: Secondary | ICD-10-CM | POA: Diagnosis not present

## 2023-03-17 DIAGNOSIS — I2694 Multiple subsegmental pulmonary emboli without acute cor pulmonale: Secondary | ICD-10-CM | POA: Diagnosis not present

## 2023-03-17 DIAGNOSIS — C642 Malignant neoplasm of left kidney, except renal pelvis: Secondary | ICD-10-CM

## 2023-03-17 DIAGNOSIS — Z23 Encounter for immunization: Secondary | ICD-10-CM | POA: Insufficient documentation

## 2023-03-17 LAB — CBC WITH DIFFERENTIAL (CANCER CENTER ONLY)
Abs Immature Granulocytes: 0.02 10*3/uL (ref 0.00–0.07)
Basophils Absolute: 0.1 10*3/uL (ref 0.0–0.1)
Basophils Relative: 1 %
Eosinophils Absolute: 0.3 10*3/uL (ref 0.0–0.5)
Eosinophils Relative: 5 %
HCT: 40.9 % (ref 39.0–52.0)
Hemoglobin: 13.4 g/dL (ref 13.0–17.0)
Immature Granulocytes: 0 %
Lymphocytes Relative: 39 %
Lymphs Abs: 2.3 10*3/uL (ref 0.7–4.0)
MCH: 27.2 pg (ref 26.0–34.0)
MCHC: 32.8 g/dL (ref 30.0–36.0)
MCV: 83.1 fL (ref 80.0–100.0)
Monocytes Absolute: 0.6 10*3/uL (ref 0.1–1.0)
Monocytes Relative: 10 %
Neutro Abs: 2.7 10*3/uL (ref 1.7–7.7)
Neutrophils Relative %: 45 %
Platelet Count: 300 10*3/uL (ref 150–400)
RBC: 4.92 MIL/uL (ref 4.22–5.81)
RDW: 15.6 % — ABNORMAL HIGH (ref 11.5–15.5)
WBC Count: 6.1 10*3/uL (ref 4.0–10.5)
nRBC: 0 % (ref 0.0–0.2)

## 2023-03-17 LAB — CMP (CANCER CENTER ONLY)
ALT: 23 U/L (ref 0–44)
AST: 22 U/L (ref 15–41)
Albumin: 3.9 g/dL (ref 3.5–5.0)
Alkaline Phosphatase: 65 U/L (ref 38–126)
Anion gap: 7 (ref 5–15)
BUN: 9 mg/dL (ref 6–20)
CO2: 25 mmol/L (ref 22–32)
Calcium: 8.9 mg/dL (ref 8.9–10.3)
Chloride: 104 mmol/L (ref 98–111)
Creatinine: 0.87 mg/dL (ref 0.61–1.24)
GFR, Estimated: >60 mL/min (ref >60)
Glucose, Bld: 96 mg/dL (ref 70–99)
Potassium: 3.5 mmol/L (ref 3.5–5.1)
Sodium: 136 mmol/L (ref 135–145)
Total Bilirubin: 0.9 mg/dL (ref 0.3–1.2)
Total Protein: 7.2 g/dL (ref 6.5–8.1)

## 2023-03-17 MED ORDER — HEPARIN SOD (PORK) LOCK FLUSH 100 UNIT/ML IV SOLN
500.0000 [IU] | Freq: Once | INTRAVENOUS | Status: AC | PRN
  Administered 2023-03-17: 500 [IU]
  Filled 2023-03-17: qty 5

## 2023-03-17 MED ORDER — INFLUENZA VIRUS VACC SPLIT PF (FLUZONE) 0.5 ML IM SUSY
0.5000 mL | PREFILLED_SYRINGE | Freq: Once | INTRAMUSCULAR | Status: AC
  Administered 2023-03-17: 0.5 mL via INTRAMUSCULAR
  Filled 2023-03-17: qty 0.5

## 2023-03-17 MED ORDER — INFLUENZA VIRUS VACC SPLIT PF (FLUZONE) 0.5 ML IM SUSY: 0.5000 mL | PREFILLED_SYRINGE | Freq: Once | INTRAMUSCULAR | Status: DC

## 2023-03-17 MED ORDER — SODIUM CHLORIDE 0.9 % IV SOLN
Freq: Once | INTRAVENOUS | Status: AC
  Filled 2023-03-17: qty 250

## 2023-03-17 MED ORDER — FAMOTIDINE IN NACL 20-0.9 MG/50ML-% IV SOLN
20.0000 mg | Freq: Once | INTRAVENOUS | Status: AC
  Administered 2023-03-17: 20 mg via INTRAVENOUS
  Filled 2023-03-17: qty 50

## 2023-03-17 MED ORDER — SODIUM CHLORIDE 0.9 % IV SOLN
2.9000 mg/kg | Freq: Once | INTRAVENOUS | Status: AC
  Administered 2023-03-17: 200 mg via INTRAVENOUS
  Filled 2023-03-17: qty 20

## 2023-03-17 MED ORDER — DIPHENHYDRAMINE HCL 50 MG/ML IJ SOLN
25.0000 mg | Freq: Once | INTRAMUSCULAR | Status: AC
  Administered 2023-03-17: 25 mg via INTRAVENOUS
  Filled 2023-03-17: qty 1

## 2023-03-17 MED ORDER — SODIUM CHLORIDE 0.9 % IV SOLN
1.0000 mg/kg | Freq: Once | INTRAVENOUS | Status: AC
  Administered 2023-03-17: 70 mg via INTRAVENOUS
  Filled 2023-03-17: qty 14

## 2023-03-17 NOTE — Progress Notes (Signed)
About 2 days ago patient started having some stomach pain, which he said it feels like a pulled muscle sensation. When walking the dog yesterday he felt that pain with almost every step he took, it comes and goes. Patient took his pain medication and some tylenol and that help take away the pain.

## 2023-03-17 NOTE — Patient Instructions (Signed)
[facility]  Discharge Instructions: Thank you for choosing [facility] to provide your oncology and hematology care.  If you have a lab appointment with the Cancer Center, please go directly to the Cancer Center and check in at the registration area.  Wear comfortable clothing and clothing appropriate for easy access to any Portacath or PICC line.   We strive to give you quality time with your provider. You may need to reschedule your appointment if you arrive late (15 or more minutes).  Arriving late affects you and other patients whose appointments are after yours.  Also, if you miss three or more appointments without notifying the office, you may be dismissed from the clinic at the provider's discretion.      For prescription refill requests, have your pharmacy contact our office and allow 72 hours for refills to be completed.    Today you received the following chemotherapy and/or immunotherapy agents opdivo and yervoy      To help prevent nausea and vomiting after your treatment, we encourage you to take your nausea medication as directed.  BELOW ARE SYMPTOMS THAT SHOULD BE REPORTED IMMEDIATELY: *FEVER GREATER THAN 100.4 F (38 C) OR HIGHER *CHILLS OR SWEATING *NAUSEA AND VOMITING THAT IS NOT CONTROLLED WITH YOUR NAUSEA MEDICATION *UNUSUAL SHORTNESS OF BREATH *UNUSUAL BRUISING OR BLEEDING *URINARY PROBLEMS (pain or burning when urinating, or frequent urination) *BOWEL PROBLEMS (unusual diarrhea, constipation, pain near the anus) TENDERNESS IN MOUTH AND THROAT WITH OR WITHOUT PRESENCE OF ULCERS (sore throat, sores in mouth, or a toothache) UNUSUAL RASH, SWELLING OR PAIN  UNUSUAL VAGINAL DISCHARGE OR ITCHING   Items with * indicate a potential emergency and should be followed up as soon as possible or go to the Emergency Department if any problems should occur.  Please show the CHEMOTHERAPY ALERT CARD or IMMUNOTHERAPY ALERT CARD at  check-in to the Emergency Department and triage nurse.  Should you have questions after your visit or need to cancel or reschedule your appointment, please contact [place]  [phone] and follow the prompts.  Office hours are 8:00 a.m. to 4:30 p.m. Monday - Friday. Please note that voicemails left after 4:00 p.m. may not be returned until the following business day.  We are closed weekends and major holidays. You have access to a nurse at all times for urgent questions. Please call the main number to the clinic [phone] and follow the prompts.  For any non-urgent questions, you may also contact your provider using MyChart. We now offer e-Visits for anyone 15 and older to request care online for non-urgent symptoms. For details visit [email or web address].   Also download the MyChart app! Go to the app store, search "MyChart", open the app, select [place], and log in with your MyChart username and password.
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
## 2023-03-18 ENCOUNTER — Other Ambulatory Visit (INDEPENDENT_AMBULATORY_CARE_PROVIDER_SITE_OTHER): Payer: Federal, State, Local not specified - PPO

## 2023-03-18 DIAGNOSIS — Z0001 Encounter for general adult medical examination with abnormal findings: Secondary | ICD-10-CM

## 2023-03-18 LAB — PSA: PSA: 0.39 ng/mL (ref 0.10–4.00)

## 2023-03-20 ENCOUNTER — Encounter: Payer: Self-pay | Admitting: Internal Medicine

## 2023-03-21 ENCOUNTER — Ambulatory Visit: Payer: Federal, State, Local not specified - PPO | Attending: Physician Assistant

## 2023-03-21 DIAGNOSIS — I152 Hypertension secondary to endocrine disorders: Secondary | ICD-10-CM | POA: Diagnosis not present

## 2023-03-21 DIAGNOSIS — Z79899 Other long term (current) drug therapy: Secondary | ICD-10-CM

## 2023-03-21 DIAGNOSIS — E1159 Type 2 diabetes mellitus with other circulatory complications: Secondary | ICD-10-CM | POA: Diagnosis not present

## 2023-03-22 ENCOUNTER — Encounter: Payer: Self-pay | Admitting: Internal Medicine

## 2023-03-22 LAB — BASIC METABOLIC PANEL
BUN/Creatinine Ratio: 9 (ref 9–20)
BUN: 10 mg/dL (ref 6–24)
CO2: 27 mmol/L (ref 20–29)
Calcium: 10 mg/dL (ref 8.7–10.2)
Chloride: 101 mmol/L (ref 96–106)
Creatinine, Ser: 1.11 mg/dL (ref 0.76–1.27)
Glucose: 89 mg/dL (ref 70–99)
Potassium: 3.9 mmol/L (ref 3.5–5.2)
Sodium: 142 mmol/L (ref 134–144)
eGFR: 79 mL/min/{1.73_m2} (ref >59)

## 2023-03-24 ENCOUNTER — Other Ambulatory Visit: Payer: Self-pay

## 2023-03-25 ENCOUNTER — Other Ambulatory Visit: Payer: Self-pay

## 2023-03-27 ENCOUNTER — Other Ambulatory Visit: Payer: Self-pay | Admitting: Family Medicine

## 2023-03-31 ENCOUNTER — Encounter: Payer: Self-pay | Admitting: Internal Medicine

## 2023-03-31 ENCOUNTER — Other Ambulatory Visit: Payer: Self-pay | Admitting: Internal Medicine

## 2023-03-31 DIAGNOSIS — I519 Heart disease, unspecified: Secondary | ICD-10-CM

## 2023-03-31 DIAGNOSIS — C7951 Secondary malignant neoplasm of bone: Secondary | ICD-10-CM | POA: Diagnosis not present

## 2023-03-31 DIAGNOSIS — E1159 Type 2 diabetes mellitus with other circulatory complications: Secondary | ICD-10-CM

## 2023-03-31 MED ORDER — METOPROLOL TARTRATE 25 MG PO TABS
25.0000 mg | ORAL_TABLET | Freq: Two times a day (BID) | ORAL | 3 refills | Status: DC
Start: 2023-03-31 — End: 2023-06-28

## 2023-04-01 DIAGNOSIS — G4733 Obstructive sleep apnea (adult) (pediatric): Secondary | ICD-10-CM | POA: Diagnosis not present

## 2023-04-01 DIAGNOSIS — C7951 Secondary malignant neoplasm of bone: Secondary | ICD-10-CM | POA: Diagnosis not present

## 2023-04-04 DIAGNOSIS — C7951 Secondary malignant neoplasm of bone: Secondary | ICD-10-CM | POA: Diagnosis not present

## 2023-04-05 DIAGNOSIS — C7951 Secondary malignant neoplasm of bone: Secondary | ICD-10-CM | POA: Diagnosis not present

## 2023-04-06 DIAGNOSIS — C7951 Secondary malignant neoplasm of bone: Secondary | ICD-10-CM | POA: Diagnosis not present

## 2023-04-06 DIAGNOSIS — C649 Malignant neoplasm of unspecified kidney, except renal pelvis: Secondary | ICD-10-CM | POA: Diagnosis not present

## 2023-04-07 ENCOUNTER — Inpatient Hospital Stay: Payer: Federal, State, Local not specified - PPO

## 2023-04-07 ENCOUNTER — Encounter: Payer: Self-pay | Admitting: Internal Medicine

## 2023-04-07 ENCOUNTER — Inpatient Hospital Stay: Payer: Federal, State, Local not specified - PPO | Attending: Internal Medicine

## 2023-04-07 ENCOUNTER — Inpatient Hospital Stay (HOSPITAL_BASED_OUTPATIENT_CLINIC_OR_DEPARTMENT_OTHER): Payer: Federal, State, Local not specified - PPO | Admitting: Internal Medicine

## 2023-04-07 VITALS — BP 121/87 | HR 94 | Temp 98.3°F | Wt 150.0 lb

## 2023-04-07 DIAGNOSIS — D509 Iron deficiency anemia, unspecified: Secondary | ICD-10-CM | POA: Diagnosis not present

## 2023-04-07 DIAGNOSIS — C642 Malignant neoplasm of left kidney, except renal pelvis: Secondary | ICD-10-CM | POA: Insufficient documentation

## 2023-04-07 DIAGNOSIS — Z79620 Long term (current) use of immunosuppressive biologic: Secondary | ICD-10-CM | POA: Diagnosis not present

## 2023-04-07 DIAGNOSIS — Z7901 Long term (current) use of anticoagulants: Secondary | ICD-10-CM | POA: Insufficient documentation

## 2023-04-07 DIAGNOSIS — I2699 Other pulmonary embolism without acute cor pulmonale: Secondary | ICD-10-CM | POA: Insufficient documentation

## 2023-04-07 DIAGNOSIS — Z5112 Encounter for antineoplastic immunotherapy: Secondary | ICD-10-CM | POA: Diagnosis not present

## 2023-04-07 DIAGNOSIS — C7951 Secondary malignant neoplasm of bone: Secondary | ICD-10-CM

## 2023-04-07 DIAGNOSIS — R911 Solitary pulmonary nodule: Secondary | ICD-10-CM | POA: Insufficient documentation

## 2023-04-07 LAB — CBC WITH DIFFERENTIAL (CANCER CENTER ONLY)
Abs Immature Granulocytes: 0.03 10*3/uL (ref 0.00–0.07)
Basophils Absolute: 0.1 10*3/uL (ref 0.0–0.1)
Basophils Relative: 1 %
Eosinophils Absolute: 0.3 10*3/uL (ref 0.0–0.5)
Eosinophils Relative: 3 %
HCT: 44.2 % (ref 39.0–52.0)
Hemoglobin: 14.9 g/dL (ref 13.0–17.0)
Immature Granulocytes: 0 %
Lymphocytes Relative: 23 %
Lymphs Abs: 1.8 10*3/uL (ref 0.7–4.0)
MCH: 28.2 pg (ref 26.0–34.0)
MCHC: 33.7 g/dL (ref 30.0–36.0)
MCV: 83.6 fL (ref 80.0–100.0)
Monocytes Absolute: 0.9 10*3/uL (ref 0.1–1.0)
Monocytes Relative: 11 %
Neutro Abs: 4.9 10*3/uL (ref 1.7–7.7)
Neutrophils Relative %: 62 %
Platelet Count: 263 10*3/uL (ref 150–400)
RBC: 5.29 MIL/uL (ref 4.22–5.81)
RDW: 14.2 % (ref 11.5–15.5)
WBC Count: 7.9 10*3/uL (ref 4.0–10.5)
nRBC: 0 % (ref 0.0–0.2)

## 2023-04-07 LAB — CMP (CANCER CENTER ONLY)
ALT: 27 U/L (ref 0–44)
AST: 22 U/L (ref 15–41)
Albumin: 4.3 g/dL (ref 3.5–5.0)
Alkaline Phosphatase: 75 U/L (ref 38–126)
Anion gap: 9 (ref 5–15)
BUN: 10 mg/dL (ref 6–20)
CO2: 22 mmol/L (ref 22–32)
Calcium: 9.2 mg/dL (ref 8.9–10.3)
Chloride: 104 mmol/L (ref 98–111)
Creatinine: 0.93 mg/dL (ref 0.61–1.24)
GFR, Estimated: >60 mL/min (ref >60)
Glucose, Bld: 83 mg/dL (ref 70–99)
Potassium: 3.7 mmol/L (ref 3.5–5.1)
Sodium: 135 mmol/L (ref 135–145)
Total Bilirubin: 1.3 mg/dL — ABNORMAL HIGH (ref 0.3–1.2)
Total Protein: 7.8 g/dL (ref 6.5–8.1)

## 2023-04-07 LAB — TSH: TSH: 0.582 u[IU]/mL (ref 0.350–4.500)

## 2023-04-07 MED ORDER — DIPHENHYDRAMINE HCL 50 MG/ML IJ SOLN
25.0000 mg | Freq: Once | INTRAMUSCULAR | Status: AC
  Administered 2023-04-07: 25 mg via INTRAVENOUS
  Filled 2023-04-07: qty 1

## 2023-04-07 MED ORDER — SODIUM CHLORIDE 0.9 % IV SOLN
1.0000 mg/kg | Freq: Once | INTRAVENOUS | Status: AC
  Administered 2023-04-07: 70 mg via INTRAVENOUS
  Filled 2023-04-07: qty 14

## 2023-04-07 MED ORDER — SODIUM CHLORIDE 0.9 % IV SOLN
2.9000 mg/kg | Freq: Once | INTRAVENOUS | Status: AC
  Administered 2023-04-07: 200 mg via INTRAVENOUS
  Filled 2023-04-07: qty 20

## 2023-04-07 MED ORDER — FAMOTIDINE IN NACL 20-0.9 MG/50ML-% IV SOLN
20.0000 mg | Freq: Once | INTRAVENOUS | Status: AC
  Administered 2023-04-07: 20 mg via INTRAVENOUS
  Filled 2023-04-07: qty 50

## 2023-04-07 MED ORDER — HEPARIN SOD (PORK) LOCK FLUSH 100 UNIT/ML IV SOLN
500.0000 [IU] | Freq: Once | INTRAVENOUS | Status: AC | PRN
  Administered 2023-04-07: 500 [IU]
  Filled 2023-04-07: qty 5

## 2023-04-07 MED ORDER — SODIUM CHLORIDE 0.9 % IV SOLN
Freq: Once | INTRAVENOUS | Status: AC
  Filled 2023-04-07: qty 250

## 2023-04-07 NOTE — Patient Instructions (Signed)

## 2023-04-07 NOTE — Progress Notes (Signed)
Ipilimumab (YERVOY) Patient Monitoring Assessment   Is the patient experiencing any of the following general symptoms?:  [ ] Difficulty performing normal activities [ ] Feeling sluggish or cold all the time [ ] Unusual weight gain [ ] Constant or unusual headaches [ ] Feeling dizzy or faint [ ] Changes in eyesight (blurry vision, double vision, or other vision problems) [ ] Changes in mood or behavior (ex: decreased sex drive, irritability, or forgetfulness) [ ] Starting new medications (ex: steroids, other medications that lower immune response) [ X]Patient is not experiencing any of the general symptoms above.   Gastrointestinal  Patient is having 1 bowel movements each day.  Is this different from baseline? [ ] Yes [ X]No Are your stools watery or do they have a foul smell? [ ] Yes [ X]No Have you seen blood in your stools? [ ] Yes [ X]No Are your stools dark, tarry, or sticky? [ ] Yes [ X]No Are you having pain or tenderness in your belly? [ ] Yes [ ] No  Skin Does your skin itch? [ ] Yes [ ] No Do you have a rash? [ ] Yes [ ] No Has your skin blistered and/or peeled? [ ] Yes [ ] No Do you have sores in your mouth? [ ] Yes [ ] No  Hepatic Has your urine been dark or tea colored? [ ] Yes [X ]No Have you noticed that your skin or the whites of your eyes are turning yellow? [ ] Yes [X ]No Are you bleeding or bruising more easily than normal? [ ] Yes [X ]No Are you nauseous and/or vomiting? [ ] Yes [ X]No Do you have pain on the right side of your stomach? [ ] Yes [ X]No  Neurologic  Are you having unusual weakness of legs, arms, or face? [ ] Yes [X ]No Are you having numbness or tingling in your hands or feet? [ ] Yes [ X]No  Adora Fridge

## 2023-04-07 NOTE — Progress Notes (Signed)
Patient states the only thing that is really bothering him is his tongue being sensitive to certain foods and beverages but other than that he is doing well. Today is his last radiation treatment.
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
## 2023-04-08 ENCOUNTER — Other Ambulatory Visit: Payer: Self-pay

## 2023-04-08 LAB — T4: T4, Total: 8.7 ug/dL (ref 4.5–12.0)

## 2023-04-11 ENCOUNTER — Other Ambulatory Visit: Payer: Self-pay

## 2023-04-15 ENCOUNTER — Other Ambulatory Visit: Payer: Self-pay

## 2023-04-15 DIAGNOSIS — E1165 Type 2 diabetes mellitus with hyperglycemia: Secondary | ICD-10-CM

## 2023-04-15 MED ORDER — DAPAGLIFLOZIN PRO-METFORMIN ER 5-1000 MG PO TB24: 1.0000 | ORAL_TABLET | Freq: Every day | ORAL | 1 refills | Status: DC

## 2023-04-25 ENCOUNTER — Encounter: Payer: Self-pay | Admitting: Internal Medicine

## 2023-04-25 ENCOUNTER — Other Ambulatory Visit: Payer: Self-pay

## 2023-04-28 ENCOUNTER — Inpatient Hospital Stay: Payer: Federal, State, Local not specified - PPO

## 2023-04-28 ENCOUNTER — Inpatient Hospital Stay (HOSPITAL_BASED_OUTPATIENT_CLINIC_OR_DEPARTMENT_OTHER): Payer: Federal, State, Local not specified - PPO | Admitting: Internal Medicine

## 2023-04-28 VITALS — BP 108/83 | HR 80 | Temp 98.3°F | Wt 154.0 lb

## 2023-04-28 DIAGNOSIS — Z7901 Long term (current) use of anticoagulants: Secondary | ICD-10-CM | POA: Diagnosis not present

## 2023-04-28 DIAGNOSIS — C7951 Secondary malignant neoplasm of bone: Secondary | ICD-10-CM

## 2023-04-28 DIAGNOSIS — C642 Malignant neoplasm of left kidney, except renal pelvis: Secondary | ICD-10-CM

## 2023-04-28 DIAGNOSIS — C649 Malignant neoplasm of unspecified kidney, except renal pelvis: Secondary | ICD-10-CM | POA: Diagnosis not present

## 2023-04-28 DIAGNOSIS — R911 Solitary pulmonary nodule: Secondary | ICD-10-CM | POA: Diagnosis not present

## 2023-04-28 DIAGNOSIS — Z79620 Long term (current) use of immunosuppressive biologic: Secondary | ICD-10-CM | POA: Diagnosis not present

## 2023-04-28 DIAGNOSIS — D509 Iron deficiency anemia, unspecified: Secondary | ICD-10-CM | POA: Diagnosis not present

## 2023-04-28 DIAGNOSIS — Z5112 Encounter for antineoplastic immunotherapy: Secondary | ICD-10-CM

## 2023-04-28 DIAGNOSIS — I2699 Other pulmonary embolism without acute cor pulmonale: Secondary | ICD-10-CM | POA: Diagnosis not present

## 2023-04-28 LAB — CBC WITH DIFFERENTIAL (CANCER CENTER ONLY)
Abs Immature Granulocytes: 0.02 10*3/uL (ref 0.00–0.07)
Basophils Absolute: 0 10*3/uL (ref 0.0–0.1)
Basophils Relative: 1 %
Eosinophils Absolute: 0.3 10*3/uL (ref 0.0–0.5)
Eosinophils Relative: 5 %
HCT: 43.7 % (ref 39.0–52.0)
Hemoglobin: 14.5 g/dL (ref 13.0–17.0)
Immature Granulocytes: 0 %
Lymphocytes Relative: 24 %
Lymphs Abs: 1.4 10*3/uL (ref 0.7–4.0)
MCH: 28.2 pg (ref 26.0–34.0)
MCHC: 33.2 g/dL (ref 30.0–36.0)
MCV: 84.9 fL (ref 80.0–100.0)
Monocytes Absolute: 0.6 10*3/uL (ref 0.1–1.0)
Monocytes Relative: 10 %
Neutro Abs: 3.5 10*3/uL (ref 1.7–7.7)
Neutrophils Relative %: 60 %
Platelet Count: 251 10*3/uL (ref 150–400)
RBC: 5.15 MIL/uL (ref 4.22–5.81)
RDW: 14 % (ref 11.5–15.5)
WBC Count: 5.7 10*3/uL (ref 4.0–10.5)
nRBC: 0 % (ref 0.0–0.2)

## 2023-04-28 LAB — CMP (CANCER CENTER ONLY)
ALT: 28 U/L (ref 0–44)
AST: 24 U/L (ref 15–41)
Albumin: 4.2 g/dL (ref 3.5–5.0)
Alkaline Phosphatase: 68 U/L (ref 38–126)
Anion gap: 9 (ref 5–15)
BUN: 11 mg/dL (ref 6–20)
CO2: 24 mmol/L (ref 22–32)
Calcium: 9.4 mg/dL (ref 8.9–10.3)
Chloride: 107 mmol/L (ref 98–111)
Creatinine: 1.07 mg/dL (ref 0.61–1.24)
GFR, Estimated: >60 mL/min (ref >60)
Glucose, Bld: 143 mg/dL — ABNORMAL HIGH (ref 70–99)
Potassium: 4.1 mmol/L (ref 3.5–5.1)
Sodium: 140 mmol/L (ref 135–145)
Total Bilirubin: 1.3 mg/dL — ABNORMAL HIGH (ref 0.3–1.2)
Total Protein: 7.2 g/dL (ref 6.5–8.1)

## 2023-04-28 MED ORDER — FAMOTIDINE IN NACL 20-0.9 MG/50ML-% IV SOLN
20.0000 mg | Freq: Once | INTRAVENOUS | Status: AC
  Administered 2023-04-28: 20 mg via INTRAVENOUS
  Filled 2023-04-28: qty 50

## 2023-04-28 MED ORDER — HEPARIN SOD (PORK) LOCK FLUSH 100 UNIT/ML IV SOLN
500.0000 [IU] | Freq: Once | INTRAVENOUS | Status: AC | PRN
  Administered 2023-04-28: 500 [IU]
  Filled 2023-04-28: qty 5

## 2023-04-28 MED ORDER — SODIUM CHLORIDE 0.9 % IV SOLN
1.0000 mg/kg | Freq: Once | INTRAVENOUS | Status: AC
  Administered 2023-04-28: 70 mg via INTRAVENOUS
  Filled 2023-04-28: qty 14

## 2023-04-28 MED ORDER — SODIUM CHLORIDE 0.9 % IV SOLN
Freq: Once | INTRAVENOUS | Status: AC
  Filled 2023-04-28: qty 250

## 2023-04-28 MED ORDER — NIVOLUMAB CHEMO INJECTION 100 MG/10ML
2.9000 mg/kg | Freq: Once | INTRAVENOUS | Status: AC
  Administered 2023-04-28: 200 mg via INTRAVENOUS
  Filled 2023-04-28: qty 20

## 2023-04-28 MED ORDER — DIPHENHYDRAMINE HCL 50 MG/ML IJ SOLN
25.0000 mg | Freq: Once | INTRAMUSCULAR | Status: AC
  Administered 2023-04-28: 25 mg via INTRAVENOUS
  Filled 2023-04-28: qty 1

## 2023-04-28 NOTE — Progress Notes (Signed)
Ipilimumab (YERVOY) Patient Monitoring Assessment   Is the patient experiencing any of the following general symptoms?:  [ ] Difficulty performing normal activities [ ] Feeling sluggish or cold all the time [ ] Unusual weight gain [ ] Constant or unusual headaches [ ] Feeling dizzy or faint [ ] Changes in eyesight (blurry vision, double vision, or other vision problems) [ ] Changes in mood or behavior (ex: decreased sex drive, irritability, or forgetfulness) [ ] Starting new medications (ex: steroids, other medications that lower immune response) [X ]Patient is not experiencing any of the general symptoms above.   Gastrointestinal  Patient is having 1 bowel movements each day.  Is this different from baseline? [ ] Yes [X ]No Are your stools watery or do they have a foul smell? [ ] Yes [X ]No Have you seen blood in your stools? [ ] Yes [X ]No Are your stools dark, tarry, or sticky? [ ] Yes [ X]No Are you having pain or tenderness in your belly? [ ] Yes [ X]No  Skin Does your skin itch? [ ] Yes [X ]No Do you have a rash? [ ] Yes [X ]No Has your skin blistered and/or peeled? [ ] Yes [ X]No Do you have sores in your mouth? [ ] Yes [ X]No  Hepatic Has your urine been dark or tea colored? [ ] Yes [X ]No Have you noticed that your skin or the whites of your eyes are turning yellow? [ ] Yes [X ]No Are you bleeding or bruising more easily than normal? [ ] Yes [x ]No Are you nauseous and/or vomiting? [ ] Yes [ X]No Do you have pain on the right side of your stomach? [ ] Yes [X ]No  Neurologic  Are you having unusual weakness of legs, arms, or face? [ ] Yes [X ]No Are you having numbness or tingling in your hands or feet? [ ] Yes [X ]No  Enrique Sack P Christen Bedoya

## 2023-04-28 NOTE — Patient Instructions (Signed)
[place] [facility]  Discharge Instructions: Thank you for choosing [facility] to provide your oncology and hematology care.  If you have a lab appointment with the Cancer Center, please go directly to the Cancer Center and check in at the registration area.  Wear comfortable clothing and clothing appropriate for easy access to any Portacath or PICC line.   We strive to give you quality time with your provider. You may need to reschedule your appointment if you arrive late (15 or more minutes).  Arriving late affects you and other patients whose appointments are after yours.  Also, if you miss three or more appointments without notifying the office, you may be dismissed from the clinic at the provider's discretion.      For prescription refill requests, have your pharmacy contact our office and allow 72 hours for refills to be completed.    Today you received the following chemotherapy and/or immunotherapy agents: Opdivo, Arthur Holms.   To help prevent nausea and vomiting after your treatment, we encourage you to take your nausea medication as directed.  BELOW ARE SYMPTOMS THAT SHOULD BE REPORTED IMMEDIATELY: *FEVER GREATER THAN 100.4 F (38 C) OR HIGHER *CHILLS OR SWEATING *NAUSEA AND VOMITING THAT IS NOT CONTROLLED WITH YOUR NAUSEA MEDICATION *UNUSUAL SHORTNESS OF BREATH *UNUSUAL BRUISING OR BLEEDING *URINARY PROBLEMS (pain or burning when urinating, or frequent urination) *BOWEL PROBLEMS (unusual diarrhea, constipation, pain near the anus) TENDERNESS IN MOUTH AND THROAT WITH OR WITHOUT PRESENCE OF ULCERS (sore throat, sores in mouth, or a toothache) UNUSUAL RASH, SWELLING OR PAIN  UNUSUAL VAGINAL DISCHARGE OR ITCHING   Items with * indicate a potential emergency and should be followed up as soon as possible or go to the Emergency Department if any problems should occur.  Please show the CHEMOTHERAPY ALERT CARD or IMMUNOTHERAPY ALERT CARD at check-in  to the Emergency Department and triage nurse.  Should you have questions after your visit or need to cancel or reschedule your appointment, please contact [place] [facility]  [phone] and follow the prompts.  Office hours are 8:00 a.m. to 4:30 p.m. Monday - Friday. Please note that voicemails left after 4:00 p.m. may not be returned until the following business day.  We are closed weekends and major holidays. You have access to a nurse at all times for urgent questions. Please call the main number to the clinic [phone] and follow the prompts.  For any non-urgent questions, you may also contact your provider using MyChart. We now offer e-Visits for anyone 37 and older to request care online for non-urgent symptoms. For details visit [email or web address].   Also download the MyChart app! Go to the app store, search "MyChart", open the app, select [place], and log in with your MyChart username and password.
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
## 2023-04-28 NOTE — Progress Notes (Signed)
Patient is doing well, just still having some sensitivity on his tongue. He did tell me that he is flying to [place] on October 31 st 2024, and will be gone for a few days.

## 2023-05-01 DIAGNOSIS — G4733 Obstructive sleep apnea (adult) (pediatric): Secondary | ICD-10-CM | POA: Diagnosis not present

## 2023-05-02 ENCOUNTER — Encounter: Payer: Self-pay | Admitting: Internal Medicine

## 2023-05-02 ENCOUNTER — Ambulatory Visit
Admission: RE | Admit: 2023-05-02 | Discharge: 2023-05-02 | Disposition: A | Discharge status: Home / Self Care | Payer: Federal, State, Local not specified - PPO | Source: Ambulatory Visit | Attending: Internal Medicine | Admitting: Internal Medicine

## 2023-05-02 DIAGNOSIS — I2694 Multiple subsegmental pulmonary emboli without acute cor pulmonale: Secondary | ICD-10-CM | POA: Diagnosis not present

## 2023-05-02 DIAGNOSIS — N200 Calculus of kidney: Secondary | ICD-10-CM | POA: Diagnosis not present

## 2023-05-02 DIAGNOSIS — C642 Malignant neoplasm of left kidney, except renal pelvis: Secondary | ICD-10-CM | POA: Insufficient documentation

## 2023-05-02 DIAGNOSIS — C799 Secondary malignant neoplasm of unspecified site: Secondary | ICD-10-CM | POA: Diagnosis not present

## 2023-05-02 DIAGNOSIS — Z905 Acquired absence of kidney: Secondary | ICD-10-CM | POA: Diagnosis not present

## 2023-05-02 DIAGNOSIS — C7951 Secondary malignant neoplasm of bone: Secondary | ICD-10-CM | POA: Diagnosis not present

## 2023-05-02 DIAGNOSIS — C649 Malignant neoplasm of unspecified kidney, except renal pelvis: Secondary | ICD-10-CM | POA: Diagnosis not present

## 2023-05-02 MED ORDER — IOHEXOL 300 MG/ML  SOLN
85.0000 mL | Freq: Once | INTRAMUSCULAR | Status: AC | PRN
  Administered 2023-05-02: 85 mL via INTRAVENOUS

## 2023-05-02 MED ORDER — HEPARIN SOD (PORK) LOCK FLUSH 100 UNIT/ML IV SOLN
500.0000 [IU] | Freq: Once | INTRAVENOUS | Status: AC
  Administered 2023-05-02: 500 [IU] via INTRAVENOUS

## 2023-05-10 ENCOUNTER — Other Ambulatory Visit: Payer: Self-pay

## 2023-05-11 ENCOUNTER — Inpatient Hospital Stay: Payer: Federal, State, Local not specified - PPO

## 2023-05-11 ENCOUNTER — Other Ambulatory Visit: Payer: Self-pay

## 2023-05-11 ENCOUNTER — Inpatient Hospital Stay (HOSPITAL_BASED_OUTPATIENT_CLINIC_OR_DEPARTMENT_OTHER): Payer: Federal, State, Local not specified - PPO | Admitting: Hospice and Palliative Medicine

## 2023-05-11 ENCOUNTER — Inpatient Hospital Stay: Payer: Federal, State, Local not specified - PPO | Attending: Internal Medicine

## 2023-05-11 ENCOUNTER — Encounter: Payer: Self-pay | Admitting: Internal Medicine

## 2023-05-11 ENCOUNTER — Encounter: Payer: Self-pay | Admitting: Hospice and Palliative Medicine

## 2023-05-11 VITALS — BP 116/75 | HR 127 | Temp 98.9°F | Resp 18

## 2023-05-11 VITALS — BP 116/75 | HR 127 | Temp 98.9°F

## 2023-05-11 DIAGNOSIS — R300 Dysuria: Secondary | ICD-10-CM | POA: Diagnosis not present

## 2023-05-11 DIAGNOSIS — D509 Iron deficiency anemia, unspecified: Secondary | ICD-10-CM | POA: Insufficient documentation

## 2023-05-11 DIAGNOSIS — C7951 Secondary malignant neoplasm of bone: Secondary | ICD-10-CM | POA: Diagnosis not present

## 2023-05-11 DIAGNOSIS — B349 Viral infection, unspecified: Secondary | ICD-10-CM | POA: Insufficient documentation

## 2023-05-11 DIAGNOSIS — R509 Fever, unspecified: Secondary | ICD-10-CM

## 2023-05-11 DIAGNOSIS — C642 Malignant neoplasm of left kidney, except renal pelvis: Secondary | ICD-10-CM | POA: Insufficient documentation

## 2023-05-11 DIAGNOSIS — N200 Calculus of kidney: Secondary | ICD-10-CM | POA: Insufficient documentation

## 2023-05-11 DIAGNOSIS — R112 Nausea with vomiting, unspecified: Secondary | ICD-10-CM

## 2023-05-11 DIAGNOSIS — R197 Diarrhea, unspecified: Secondary | ICD-10-CM | POA: Diagnosis not present

## 2023-05-11 DIAGNOSIS — Z7901 Long term (current) use of anticoagulants: Secondary | ICD-10-CM | POA: Insufficient documentation

## 2023-05-11 DIAGNOSIS — R911 Solitary pulmonary nodule: Secondary | ICD-10-CM | POA: Diagnosis not present

## 2023-05-11 DIAGNOSIS — I10 Essential (primary) hypertension: Secondary | ICD-10-CM | POA: Insufficient documentation

## 2023-05-11 DIAGNOSIS — Z86711 Personal history of pulmonary embolism: Secondary | ICD-10-CM | POA: Insufficient documentation

## 2023-05-11 DIAGNOSIS — R0602 Shortness of breath: Secondary | ICD-10-CM

## 2023-05-11 DIAGNOSIS — Z5112 Encounter for antineoplastic immunotherapy: Secondary | ICD-10-CM

## 2023-05-11 LAB — URINALYSIS, COMPLETE (UACMP) WITH MICROSCOPIC
Bilirubin Urine: NEGATIVE
Glucose, UA: >=500 mg/dL — AB
Ketones, ur: 80 mg/dL — AB
Leukocytes,Ua: NEGATIVE
Nitrite: NEGATIVE
Protein, ur: 30 mg/dL — AB
Specific Gravity, Urine: 1.021 (ref 1.005–1.030)
pH: 5 (ref 5.0–8.0)

## 2023-05-11 LAB — COMPREHENSIVE METABOLIC PANEL
ALT: 18 U/L (ref 0–44)
AST: 18 U/L (ref 15–41)
Albumin: 3.1 g/dL — ABNORMAL LOW (ref 3.5–5.0)
Alkaline Phosphatase: 46 U/L (ref 38–126)
Anion gap: 12 (ref 5–15)
BUN: 15 mg/dL (ref 6–20)
CO2: 21 mmol/L — ABNORMAL LOW (ref 22–32)
Calcium: 8.3 mg/dL — ABNORMAL LOW (ref 8.9–10.3)
Chloride: 98 mmol/L (ref 98–111)
Creatinine, Ser: 1.05 mg/dL (ref 0.61–1.24)
GFR, Estimated: >60 mL/min (ref >60)
Glucose, Bld: 96 mg/dL (ref 70–99)
Potassium: 3.5 mmol/L (ref 3.5–5.1)
Sodium: 131 mmol/L — ABNORMAL LOW (ref 135–145)
Total Bilirubin: 1.8 mg/dL — ABNORMAL HIGH (ref <1.2)
Total Protein: 6.9 g/dL (ref 6.5–8.1)

## 2023-05-11 LAB — RESPIRATORY PANEL BY PCR

## 2023-05-11 LAB — CBC WITH DIFFERENTIAL (CANCER CENTER ONLY)
Abs Immature Granulocytes: 0.11 10*3/uL — ABNORMAL HIGH (ref 0.00–0.07)
Basophils Absolute: 0 10*3/uL (ref 0.0–0.1)
Basophils Relative: 0 %
Eosinophils Absolute: 0.1 10*3/uL (ref 0.0–0.5)
Eosinophils Relative: 2 %
HCT: 40.9 % (ref 39.0–52.0)
Hemoglobin: 14.1 g/dL (ref 13.0–17.0)
Immature Granulocytes: 1 %
Lymphocytes Relative: 10 %
Lymphs Abs: 1 10*3/uL (ref 0.7–4.0)
MCH: 28.9 pg (ref 26.0–34.0)
MCHC: 34.5 g/dL (ref 30.0–36.0)
MCV: 83.8 fL (ref 80.0–100.0)
Monocytes Absolute: 3.4 10*3/uL — ABNORMAL HIGH (ref 0.1–1.0)
Monocytes Relative: 36 %
Neutro Abs: 4.9 10*3/uL (ref 1.7–7.7)
Neutrophils Relative %: 51 %
Platelet Count: 245 10*3/uL (ref 150–400)
RBC: 4.88 MIL/uL (ref 4.22–5.81)
RDW: 14 % (ref 11.5–15.5)
Smear Review: NORMAL
WBC Count: 9.4 10*3/uL (ref 4.0–10.5)
nRBC: 0 % (ref 0.0–0.2)

## 2023-05-11 MED ORDER — ONDANSETRON HCL 4 MG/2ML IJ SOLN
8.0000 mg | Freq: Once | INTRAMUSCULAR | Status: AC
  Administered 2023-05-11: 8 mg via INTRAVENOUS
  Filled 2023-05-11: qty 4

## 2023-05-11 MED ORDER — HEPARIN SOD (PORK) LOCK FLUSH 100 UNIT/ML IV SOLN
500.0000 [IU] | Freq: Once | INTRAVENOUS | Status: AC
  Administered 2023-05-11: 500 [IU] via INTRAVENOUS
  Filled 2023-05-11: qty 5

## 2023-05-11 MED ORDER — SODIUM CHLORIDE 0.9% FLUSH
10.0000 mL | Freq: Once | INTRAVENOUS | Status: AC
  Administered 2023-05-11: 10 mL via INTRAVENOUS
  Filled 2023-05-11: qty 10

## 2023-05-11 MED ORDER — SODIUM CHLORIDE 0.9 % IV SOLN
INTRAVENOUS | Status: DC
Start: 2023-05-11 — End: 2023-05-11
  Filled 2023-05-11: qty 250

## 2023-05-11 NOTE — Telephone Encounter (Signed)
Rn spoke with patient. He would appreciate smc apt this afternoon and can arrive by 130 for lab check and smc   Records are available in care everywhere.
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
## 2023-05-12 ENCOUNTER — Encounter: Payer: Self-pay | Admitting: Internal Medicine

## 2023-05-12 ENCOUNTER — Other Ambulatory Visit: Payer: Self-pay | Admitting: *Deleted

## 2023-05-12 ENCOUNTER — Encounter: Payer: Self-pay | Admitting: *Deleted

## 2023-05-12 DIAGNOSIS — R197 Diarrhea, unspecified: Secondary | ICD-10-CM

## 2023-05-12 LAB — URINE CULTURE: Culture: NO GROWTH

## 2023-05-12 NOTE — Addendum Note (Signed)
Addended by: Laurette Schimke R on: 05/12/2023 04:00 PM   Modules accepted: Orders

## 2023-05-12 NOTE — Addendum Note (Signed)
Addended by: Laurette Schimke R on: 05/12/2023 04:01 PM   Modules accepted: Orders

## 2023-05-13 ENCOUNTER — Inpatient Hospital Stay: Payer: Federal, State, Local not specified - PPO

## 2023-05-13 ENCOUNTER — Encounter: Payer: Self-pay | Admitting: Internal Medicine

## 2023-05-13 ENCOUNTER — Ambulatory Visit: Payer: Federal, State, Local not specified - PPO

## 2023-05-13 ENCOUNTER — Other Ambulatory Visit: Payer: Self-pay

## 2023-05-13 ENCOUNTER — Inpatient Hospital Stay (HOSPITAL_BASED_OUTPATIENT_CLINIC_OR_DEPARTMENT_OTHER): Payer: Federal, State, Local not specified - PPO | Admitting: Internal Medicine

## 2023-05-13 ENCOUNTER — Ambulatory Visit
Admission: RE | Admit: 2023-05-13 | Discharge: 2023-05-13 | Disposition: A | Discharge status: Home / Self Care | Payer: Federal, State, Local not specified - PPO | Source: Ambulatory Visit | Attending: Internal Medicine | Admitting: Internal Medicine

## 2023-05-13 VITALS — BP 109/76 | HR 123 | Temp 99.2°F | Resp 20

## 2023-05-13 DIAGNOSIS — R197 Diarrhea, unspecified: Secondary | ICD-10-CM

## 2023-05-13 DIAGNOSIS — Z7901 Long term (current) use of anticoagulants: Secondary | ICD-10-CM | POA: Diagnosis not present

## 2023-05-13 DIAGNOSIS — R911 Solitary pulmonary nodule: Secondary | ICD-10-CM | POA: Diagnosis not present

## 2023-05-13 DIAGNOSIS — C7951 Secondary malignant neoplasm of bone: Secondary | ICD-10-CM | POA: Diagnosis not present

## 2023-05-13 DIAGNOSIS — H1033 Unspecified acute conjunctivitis, bilateral: Secondary | ICD-10-CM

## 2023-05-13 DIAGNOSIS — D509 Iron deficiency anemia, unspecified: Secondary | ICD-10-CM | POA: Diagnosis not present

## 2023-05-13 DIAGNOSIS — R112 Nausea with vomiting, unspecified: Secondary | ICD-10-CM | POA: Insufficient documentation

## 2023-05-13 DIAGNOSIS — C642 Malignant neoplasm of left kidney, except renal pelvis: Secondary | ICD-10-CM | POA: Diagnosis not present

## 2023-05-13 DIAGNOSIS — K529 Noninfective gastroenteritis and colitis, unspecified: Secondary | ICD-10-CM | POA: Diagnosis not present

## 2023-05-13 DIAGNOSIS — K8020 Calculus of gallbladder without cholecystitis without obstruction: Secondary | ICD-10-CM | POA: Diagnosis not present

## 2023-05-13 DIAGNOSIS — Z86711 Personal history of pulmonary embolism: Secondary | ICD-10-CM | POA: Diagnosis not present

## 2023-05-13 DIAGNOSIS — H109 Unspecified conjunctivitis: Secondary | ICD-10-CM | POA: Insufficient documentation

## 2023-05-13 DIAGNOSIS — N200 Calculus of kidney: Secondary | ICD-10-CM | POA: Diagnosis not present

## 2023-05-13 DIAGNOSIS — B349 Viral infection, unspecified: Secondary | ICD-10-CM | POA: Diagnosis not present

## 2023-05-13 DIAGNOSIS — I10 Essential (primary) hypertension: Secondary | ICD-10-CM | POA: Diagnosis not present

## 2023-05-13 LAB — CMP (CANCER CENTER ONLY)
ALT: 18 U/L (ref 0–44)
AST: 20 U/L (ref 15–41)
Albumin: 2.9 g/dL — ABNORMAL LOW (ref 3.5–5.0)
Alkaline Phosphatase: 39 U/L (ref 38–126)
Anion gap: 10 (ref 5–15)
BUN: 12 mg/dL (ref 6–20)
CO2: 21 mmol/L — ABNORMAL LOW (ref 22–32)
Calcium: 8 mg/dL — ABNORMAL LOW (ref 8.9–10.3)
Chloride: 101 mmol/L (ref 98–111)
Creatinine: 1.05 mg/dL (ref 0.61–1.24)
GFR, Estimated: >60 mL/min (ref >60)
Glucose, Bld: 107 mg/dL — ABNORMAL HIGH (ref 70–99)
Potassium: 3.4 mmol/L — ABNORMAL LOW (ref 3.5–5.1)
Sodium: 132 mmol/L — ABNORMAL LOW (ref 135–145)
Total Bilirubin: 1.6 mg/dL — ABNORMAL HIGH (ref <1.2)
Total Protein: 6.3 g/dL — ABNORMAL LOW (ref 6.5–8.1)

## 2023-05-13 LAB — GASTROINTESTINAL PANEL BY PCR, STOOL (REPLACES STOOL CULTURE)

## 2023-05-13 LAB — CBC WITH DIFFERENTIAL (CANCER CENTER ONLY)
Abs Immature Granulocytes: 0.19 10*3/uL — ABNORMAL HIGH (ref 0.00–0.07)
Basophils Absolute: 0 10*3/uL (ref 0.0–0.1)
Basophils Relative: 0 %
Eosinophils Absolute: 0.2 10*3/uL (ref 0.0–0.5)
Eosinophils Relative: 2 %
HCT: 39.1 % (ref 39.0–52.0)
Hemoglobin: 13.5 g/dL (ref 13.0–17.0)
Immature Granulocytes: 2 %
Lymphocytes Relative: 11 %
Lymphs Abs: 1 10*3/uL (ref 0.7–4.0)
MCH: 28.6 pg (ref 26.0–34.0)
MCHC: 34.5 g/dL (ref 30.0–36.0)
MCV: 82.8 fL (ref 80.0–100.0)
Monocytes Absolute: 2.9 10*3/uL — ABNORMAL HIGH (ref 0.1–1.0)
Monocytes Relative: 31 %
Neutro Abs: 4.9 10*3/uL (ref 1.7–7.7)
Neutrophils Relative %: 54 %
Platelet Count: 271 10*3/uL (ref 150–400)
RBC: 4.72 MIL/uL (ref 4.22–5.81)
RDW: 13.9 % (ref 11.5–15.5)
Smear Review: NORMAL
WBC Count: 9.2 10*3/uL (ref 4.0–10.5)
nRBC: 0 % (ref 0.0–0.2)

## 2023-05-13 LAB — C DIFFICILE QUICK SCREEN W PCR REFLEX
C Diff antigen: NEGATIVE
C Diff interpretation: NOT DETECTED
C Diff toxin: NEGATIVE

## 2023-05-13 LAB — MAGNESIUM: Magnesium: 1.8 mg/dL (ref 1.7–2.4)

## 2023-05-13 MED ORDER — SODIUM CHLORIDE 0.9% FLUSH
10.0000 mL | Freq: Once | INTRAVENOUS | Status: AC
  Administered 2023-05-13: 10 mL via INTRAVENOUS
  Filled 2023-05-13: qty 10

## 2023-05-13 MED ORDER — STERILE WATER FOR INJECTION IJ SOLN: 5.0000 mL | Freq: Four times a day (QID) | OROMUCOSAL | 3 refills | Status: DC

## 2023-05-13 MED ORDER — POTASSIUM CHLORIDE 20 MEQ/100ML IV SOLN
20.0000 meq | Freq: Once | INTRAVENOUS | Status: AC
  Administered 2023-05-13: 20 meq via INTRAVENOUS

## 2023-05-13 MED ORDER — STERILE WATER FOR INJECTION IJ SOLN
5.0000 mL | Freq: Four times a day (QID) | OROMUCOSAL | 3 refills | Status: DC | PRN
  Filled 2023-05-13: qty 480, 12d supply, fill #0
  Filled 2023-05-27: qty 480, 12d supply, fill #1
  Filled 2023-06-17: qty 480, 12d supply, fill #2
  Filled 2023-07-04: qty 480, 12d supply, fill #3

## 2023-05-13 MED ORDER — CIPROFLOXACIN HCL 0.3 % OP SOLN: 1.0000 [drp] | OPHTHALMIC | 0 refills | Status: DC

## 2023-05-13 MED ORDER — SODIUM CHLORIDE 0.9 % IV SOLN
INTRAVENOUS | Status: DC
  Filled 2023-05-13 (×2): qty 250

## 2023-05-13 MED ORDER — IOHEXOL 9 MG/ML PO SOLN
500.0000 mL | ORAL | Status: AC
  Administered 2023-05-13: 500 mL via ORAL

## 2023-05-13 MED ORDER — PROCHLORPERAZINE EDISYLATE 10 MG/2ML IJ SOLN
10.0000 mg | Freq: Once | INTRAMUSCULAR | Status: AC
  Administered 2023-05-13: 10 mg via INTRAVENOUS
  Filled 2023-05-13: qty 2

## 2023-05-13 MED ORDER — FAMOTIDINE IN NACL 20-0.9 MG/50ML-% IV SOLN
20.0000 mg | Freq: Once | INTRAVENOUS | Status: AC
  Administered 2023-05-13: 20 mg via INTRAVENOUS
  Filled 2023-05-13: qty 50

## 2023-05-13 MED ORDER — IOHEXOL 300 MG/ML  SOLN
100.0000 mL | Freq: Once | INTRAMUSCULAR | Status: AC | PRN
  Administered 2023-05-13: 100 mL via INTRAVENOUS

## 2023-05-13 MED ORDER — HEPARIN SOD (PORK) LOCK FLUSH 100 UNIT/ML IV SOLN
500.0000 [IU] | Freq: Once | INTRAVENOUS | Status: AC
  Administered 2023-05-13: 500 [IU] via INTRAVENOUS
  Filled 2023-05-13: qty 5
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
## 2023-05-15 ENCOUNTER — Inpatient Hospital Stay
Admission: EM | Admit: 2023-05-15 | Discharge: 2023-05-20 | DRG: 394 | Disposition: A | Discharge status: Home / Self Care | Payer: Federal, State, Local not specified - PPO | Attending: Internal Medicine | Admitting: Internal Medicine

## 2023-05-15 ENCOUNTER — Other Ambulatory Visit: Payer: Self-pay

## 2023-05-15 ENCOUNTER — Emergency Department: Payer: Federal, State, Local not specified - PPO

## 2023-05-15 ENCOUNTER — Inpatient Hospital Stay: Payer: Federal, State, Local not specified - PPO

## 2023-05-15 ENCOUNTER — Encounter: Payer: Self-pay | Admitting: Family Medicine

## 2023-05-15 DIAGNOSIS — E860 Dehydration: Secondary | ICD-10-CM | POA: Diagnosis not present

## 2023-05-15 DIAGNOSIS — I5022 Chronic systolic (congestive) heart failure: Secondary | ICD-10-CM | POA: Diagnosis not present

## 2023-05-15 DIAGNOSIS — Z7984 Long term (current) use of oral hypoglycemic drugs: Secondary | ICD-10-CM

## 2023-05-15 DIAGNOSIS — Z8249 Family history of ischemic heart disease and other diseases of the circulatory system: Secondary | ICD-10-CM

## 2023-05-15 DIAGNOSIS — C642 Malignant neoplasm of left kidney, except renal pelvis: Secondary | ICD-10-CM | POA: Diagnosis present

## 2023-05-15 DIAGNOSIS — E1169 Type 2 diabetes mellitus with other specified complication: Secondary | ICD-10-CM | POA: Diagnosis present

## 2023-05-15 DIAGNOSIS — E440 Moderate protein-calorie malnutrition: Secondary | ICD-10-CM | POA: Diagnosis not present

## 2023-05-15 DIAGNOSIS — K8020 Calculus of gallbladder without cholecystitis without obstruction: Secondary | ICD-10-CM | POA: Diagnosis present

## 2023-05-15 DIAGNOSIS — R1011 Right upper quadrant pain: Secondary | ICD-10-CM | POA: Diagnosis not present

## 2023-05-15 DIAGNOSIS — Z4682 Encounter for fitting and adjustment of non-vascular catheter: Secondary | ICD-10-CM | POA: Diagnosis not present

## 2023-05-15 DIAGNOSIS — I5020 Unspecified systolic (congestive) heart failure: Secondary | ICD-10-CM

## 2023-05-15 DIAGNOSIS — K838 Other specified diseases of biliary tract: Secondary | ICD-10-CM | POA: Diagnosis not present

## 2023-05-15 DIAGNOSIS — Z83719 Family history of colon polyps, unspecified: Secondary | ICD-10-CM | POA: Diagnosis not present

## 2023-05-15 DIAGNOSIS — T451X5A Adverse effect of antineoplastic and immunosuppressive drugs, initial encounter: Secondary | ICD-10-CM | POA: Diagnosis present

## 2023-05-15 DIAGNOSIS — E872 Acidosis, unspecified: Secondary | ICD-10-CM | POA: Diagnosis not present

## 2023-05-15 DIAGNOSIS — Z833 Family history of diabetes mellitus: Secondary | ICD-10-CM

## 2023-05-15 DIAGNOSIS — I152 Hypertension secondary to endocrine disorders: Secondary | ICD-10-CM | POA: Diagnosis present

## 2023-05-15 DIAGNOSIS — R197 Diarrhea, unspecified: Secondary | ICD-10-CM

## 2023-05-15 DIAGNOSIS — I2694 Multiple subsegmental pulmonary emboli without acute cor pulmonale: Secondary | ICD-10-CM | POA: Diagnosis not present

## 2023-05-15 DIAGNOSIS — Z86711 Personal history of pulmonary embolism: Secondary | ICD-10-CM

## 2023-05-15 DIAGNOSIS — R112 Nausea with vomiting, unspecified: Secondary | ICD-10-CM | POA: Diagnosis present

## 2023-05-15 DIAGNOSIS — K529 Noninfective gastroenteritis and colitis, unspecified: Secondary | ICD-10-CM

## 2023-05-15 DIAGNOSIS — G4733 Obstructive sleep apnea (adult) (pediatric): Secondary | ICD-10-CM | POA: Diagnosis not present

## 2023-05-15 DIAGNOSIS — Z79899 Other long term (current) drug therapy: Secondary | ICD-10-CM | POA: Diagnosis not present

## 2023-05-15 DIAGNOSIS — C649 Malignant neoplasm of unspecified kidney, except renal pelvis: Secondary | ICD-10-CM | POA: Diagnosis present

## 2023-05-15 DIAGNOSIS — Z83438 Family history of other disorder of lipoprotein metabolism and other lipidemia: Secondary | ICD-10-CM

## 2023-05-15 DIAGNOSIS — C7951 Secondary malignant neoplasm of bone: Secondary | ICD-10-CM | POA: Diagnosis not present

## 2023-05-15 DIAGNOSIS — H109 Unspecified conjunctivitis: Secondary | ICD-10-CM | POA: Diagnosis present

## 2023-05-15 DIAGNOSIS — Z85528 Personal history of other malignant neoplasm of kidney: Secondary | ICD-10-CM

## 2023-05-15 DIAGNOSIS — H1033 Unspecified acute conjunctivitis, bilateral: Secondary | ICD-10-CM | POA: Diagnosis not present

## 2023-05-15 DIAGNOSIS — K219 Gastro-esophageal reflux disease without esophagitis: Secondary | ICD-10-CM | POA: Diagnosis present

## 2023-05-15 DIAGNOSIS — Z905 Acquired absence of kidney: Secondary | ICD-10-CM

## 2023-05-15 DIAGNOSIS — Z0389 Encounter for observation for other suspected diseases and conditions ruled out: Secondary | ICD-10-CM | POA: Diagnosis not present

## 2023-05-15 DIAGNOSIS — Z6822 Body mass index (BMI) 22.0-22.9, adult: Secondary | ICD-10-CM | POA: Diagnosis not present

## 2023-05-15 DIAGNOSIS — I5189 Other ill-defined heart diseases: Secondary | ICD-10-CM | POA: Diagnosis present

## 2023-05-15 DIAGNOSIS — H15103 Unspecified episcleritis, bilateral: Secondary | ICD-10-CM | POA: Diagnosis not present

## 2023-05-15 DIAGNOSIS — Z7901 Long term (current) use of anticoagulants: Secondary | ICD-10-CM | POA: Diagnosis not present

## 2023-05-15 DIAGNOSIS — I2782 Chronic pulmonary embolism: Secondary | ICD-10-CM

## 2023-05-15 DIAGNOSIS — Z823 Family history of stroke: Secondary | ICD-10-CM

## 2023-05-15 DIAGNOSIS — E119 Type 2 diabetes mellitus without complications: Secondary | ICD-10-CM

## 2023-05-15 DIAGNOSIS — I700 Atherosclerosis of aorta: Secondary | ICD-10-CM | POA: Diagnosis not present

## 2023-05-15 DIAGNOSIS — Z87442 Personal history of urinary calculi: Secondary | ICD-10-CM

## 2023-05-15 DIAGNOSIS — K521 Toxic gastroenteritis and colitis: Principal | ICD-10-CM | POA: Diagnosis present

## 2023-05-15 DIAGNOSIS — E1159 Type 2 diabetes mellitus with other circulatory complications: Secondary | ICD-10-CM | POA: Diagnosis present

## 2023-05-15 DIAGNOSIS — I519 Heart disease, unspecified: Secondary | ICD-10-CM | POA: Diagnosis present

## 2023-05-15 LAB — URINALYSIS, W/ REFLEX TO CULTURE (INFECTION SUSPECTED)
Bacteria, UA: NONE SEEN
Bilirubin Urine: NEGATIVE
Glucose, UA: 150 mg/dL — AB
Hgb urine dipstick: NEGATIVE
Ketones, ur: 20 mg/dL — AB
Leukocytes,Ua: NEGATIVE
Nitrite: NEGATIVE
Protein, ur: 30 mg/dL — AB
Specific Gravity, Urine: 1.021 (ref 1.005–1.030)
pH: 5 (ref 5.0–8.0)

## 2023-05-15 LAB — PROTIME-INR
INR: 1.6 — ABNORMAL HIGH (ref 0.8–1.2)
Prothrombin Time: 19.5 s — ABNORMAL HIGH (ref 11.4–15.2)

## 2023-05-15 LAB — CBC WITH DIFFERENTIAL/PLATELET
Abs Immature Granulocytes: 0.17 10*3/uL — ABNORMAL HIGH (ref 0.00–0.07)
Basophils Absolute: 0 10*3/uL (ref 0.0–0.1)
Basophils Relative: 0 %
Eosinophils Absolute: 0.1 10*3/uL (ref 0.0–0.5)
Eosinophils Relative: 2 %
HCT: 38.9 % — ABNORMAL LOW (ref 39.0–52.0)
Hemoglobin: 13.4 g/dL (ref 13.0–17.0)
Immature Granulocytes: 2 %
Lymphocytes Relative: 9 %
Lymphs Abs: 0.8 10*3/uL (ref 0.7–4.0)
MCH: 29.1 pg (ref 26.0–34.0)
MCHC: 34.4 g/dL (ref 30.0–36.0)
MCV: 84.4 fL (ref 80.0–100.0)
Monocytes Absolute: 2.4 10*3/uL — ABNORMAL HIGH (ref 0.1–1.0)
Monocytes Relative: 27 %
Neutro Abs: 5.5 10*3/uL (ref 1.7–7.7)
Neutrophils Relative %: 60 %
Platelets: 283 10*3/uL (ref 150–400)
RBC: 4.61 MIL/uL (ref 4.22–5.81)
RDW: 14.3 % (ref 11.5–15.5)
Smear Review: NORMAL
WBC: 9.1 10*3/uL (ref 4.0–10.5)
nRBC: 0 % (ref 0.0–0.2)

## 2023-05-15 LAB — COMPREHENSIVE METABOLIC PANEL
ALT: 22 U/L (ref 0–44)
AST: 22 U/L (ref 15–41)
Albumin: 2.5 g/dL — ABNORMAL LOW (ref 3.5–5.0)
Alkaline Phosphatase: 44 U/L (ref 38–126)
Anion gap: 13 (ref 5–15)
BUN: 12 mg/dL (ref 6–20)
CO2: 19 mmol/L — ABNORMAL LOW (ref 22–32)
Calcium: 8.2 mg/dL — ABNORMAL LOW (ref 8.9–10.3)
Chloride: 101 mmol/L (ref 98–111)
Creatinine, Ser: 0.95 mg/dL (ref 0.61–1.24)
GFR, Estimated: >60 mL/min (ref >60)
Glucose, Bld: 74 mg/dL (ref 70–99)
Potassium: 3.7 mmol/L (ref 3.5–5.1)
Sodium: 133 mmol/L — ABNORMAL LOW (ref 135–145)
Total Bilirubin: 1.7 mg/dL — ABNORMAL HIGH (ref <1.2)
Total Protein: 6.1 g/dL — ABNORMAL LOW (ref 6.5–8.1)

## 2023-05-15 LAB — MAGNESIUM: Magnesium: 1.9 mg/dL (ref 1.7–2.4)

## 2023-05-15 LAB — APTT: aPTT: 58 s — ABNORMAL HIGH (ref 24–36)

## 2023-05-15 LAB — BRAIN NATRIURETIC PEPTIDE: B Natriuretic Peptide: 14.6 pg/mL (ref 0.0–100.0)

## 2023-05-15 LAB — TROPONIN I (HIGH SENSITIVITY): Troponin I (High Sensitivity): 4 ng/L (ref <18)

## 2023-05-15 LAB — LIPASE, BLOOD: Lipase: 35 U/L (ref 11–51)

## 2023-05-15 LAB — LACTIC ACID, PLASMA: Lactic Acid, Venous: 0.9 mmol/L (ref 0.5–1.9)

## 2023-05-15 MED ORDER — METOPROLOL TARTRATE 25 MG PO TABS
25.0000 mg | ORAL_TABLET | Freq: Once | ORAL | Status: AC
  Administered 2023-05-15: 25 mg via ORAL
  Filled 2023-05-15: qty 1

## 2023-05-15 MED ORDER — ARTIFICIAL TEARS OPHTHALMIC OINT
TOPICAL_OINTMENT | Freq: Every day | OPHTHALMIC | Status: DC
  Filled 2023-05-15: qty 3.5

## 2023-05-15 MED ORDER — APIXABAN 5 MG PO TABS
5.0000 mg | ORAL_TABLET | Freq: Once | ORAL | Status: AC
  Administered 2023-05-15: 5 mg via ORAL
  Filled 2023-05-15: qty 1

## 2023-05-15 MED ORDER — ONDANSETRON HCL 4 MG/2ML IJ SOLN
4.0000 mg | Freq: Four times a day (QID) | INTRAMUSCULAR | Status: DC | PRN
  Administered 2023-05-15 – 2023-05-19 (×2): 4 mg via INTRAVENOUS
  Filled 2023-05-15 (×2): qty 2

## 2023-05-15 MED ORDER — ENSURE ENLIVE PO LIQD
237.0000 mL | Freq: Two times a day (BID) | ORAL | Status: DC
  Administered 2023-05-16 – 2023-05-20 (×8): 237 mL via ORAL

## 2023-05-15 MED ORDER — SODIUM CHLORIDE 0.9 % IV BOLUS
500.0000 mL | Freq: Once | INTRAVENOUS | Status: AC
  Administered 2023-05-15: 500 mL via INTRAVENOUS

## 2023-05-15 MED ORDER — CHLORHEXIDINE GLUCONATE CLOTH 2 % EX PADS
6.0000 | MEDICATED_PAD | Freq: Every day | CUTANEOUS | Status: DC
Start: 2023-05-16 — End: 2023-05-20
  Administered 2023-05-16 – 2023-05-20 (×5): 6 via TOPICAL

## 2023-05-15 MED ORDER — POLYVINYL ALCOHOL 1.4 % OP SOLN
1.0000 [drp] | Freq: Four times a day (QID) | OPHTHALMIC | Status: DC
Start: 2023-05-15 — End: 2023-05-20
  Administered 2023-05-15 – 2023-05-20 (×19): 1 [drp] via OPHTHALMIC
  Filled 2023-05-15 (×3): qty 15

## 2023-05-15 MED ORDER — ONDANSETRON HCL 4 MG PO TABS: 4.0000 mg | ORAL_TABLET | Freq: Four times a day (QID) | ORAL | Status: DC | PRN

## 2023-05-15 MED ORDER — SODIUM CHLORIDE 0.9 % IV BOLUS
1000.0000 mL | Freq: Once | INTRAVENOUS | Status: AC
  Administered 2023-05-15: 1000 mL via INTRAVENOUS

## 2023-05-15 MED ORDER — ONDANSETRON HCL 4 MG/2ML IJ SOLN
4.0000 mg | Freq: Once | INTRAMUSCULAR | Status: AC
  Administered 2023-05-15: 4 mg via INTRAVENOUS
  Filled 2023-05-15: qty 2

## 2023-05-15 MED ORDER — ACETAMINOPHEN 325 MG PO TABS
650.0000 mg | ORAL_TABLET | Freq: Once | ORAL | Status: AC
  Administered 2023-05-15: 650 mg via ORAL
  Filled 2023-05-15: qty 2

## 2023-05-15 MED ORDER — PREDNISOLONE ACETATE 1 % OP SUSP
1.0000 [drp] | Freq: Four times a day (QID) | OPHTHALMIC | Status: DC
  Administered 2023-05-15 – 2023-05-20 (×19): 1 [drp] via OPHTHALMIC
  Filled 2023-05-15: qty 1

## 2023-05-15 MED ORDER — IOHEXOL 350 MG/ML SOLN
75.0000 mL | Freq: Once | INTRAVENOUS | Status: AC | PRN
  Administered 2023-05-15: 75 mL via INTRAVENOUS

## 2023-05-15 MED ORDER — SODIUM CHLORIDE 0.9 % IV BOLUS
500.0000 mL | Freq: Once | INTRAVENOUS | Status: AC
Start: 2023-05-15 — End: 2023-05-15
  Administered 2023-05-15: 500 mL via INTRAVENOUS

## 2023-05-15 MED ORDER — SODIUM CHLORIDE 0.9 % IV SOLN: INTRAVENOUS | Status: DC

## 2023-05-15 MED ORDER — ALUM & MAG HYDROXIDE-SIMETH 200-200-20 MG/5ML PO SUSP
30.0000 mL | ORAL | Status: DC | PRN
  Administered 2023-05-16 – 2023-05-20 (×4): 30 mL via ORAL
  Filled 2023-05-15 (×5): qty 30

## 2023-05-15 NOTE — Assessment & Plan Note (Addendum)
Recurrent nausea and vomiting for the past week in setting of renal cell carcinoma with metastasis to the bone on active immunotherapy and radiation Seen by outpatient oncology for similar issues-felt to be related to immunotherapy CT abdomen pelvis grossly stable Noted biliary sludge on right upper quadrant ultrasound without acute Murphy sign T. bili 1.7 LFTs grossly stable Will treat symptomatically for now As needed antiemetics IV fluids Monitor

## 2023-05-15 NOTE — Assessment & Plan Note (Addendum)
Followed by Dr. Alena Bills outpatient  s/p left robotic radical nephrectomy with adrenalectomy, retroperitoneal node dissection and thrombus removal by Dr. Berneice Heinrich on 12/15/2022, now with metastatic disease in the right hip

## 2023-05-15 NOTE — Assessment & Plan Note (Signed)
2D echo June 2024 with a EF of 40 to 45% Clinically dry Monitor volume status

## 2023-05-15 NOTE — Assessment & Plan Note (Signed)
PPI ?

## 2023-05-15 NOTE — ED Triage Notes (Signed)
Pt reports NVD for the past week. States is an active cancer pt and has seen his MD twice this past week. Pt reports was told by his oncologist that if NVD persisted he needed to come to the ED.

## 2023-05-15 NOTE — Assessment & Plan Note (Signed)
Bilateral conjunctivitis over the past 1 week or so Pending formal ophthalmology evaluation in the ER Some preliminary concern for immunotherapy associated conjunctivitis Monitor
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
## 2023-05-15 NOTE — ED Notes (Signed)
ED Provider at bedside. 

## 2023-05-15 NOTE — Assessment & Plan Note (Signed)
Blood sugar in 70s

## 2023-05-15 NOTE — ED Provider Notes (Addendum)
[facility] Provider Note    Event Date/Time   First MD Initiated Contact with Patient 05/15/23 0915     (approximate)   History   Nausea and Emesis   HPI  Paul Flowers is a 52 y.o. male past medical history significant for left renal cell carcinoma with metastasis on immunotherapy, pulmonary embolism on Eliquis, who presents to the emergency department with nausea, vomiting and not feeling well.  Patient states that for the past 1 week he has been having multiple episodes of vomiting and diarrhea.  Recent travel to [place] for a trip.  States that he has been evaluated a couple of times with oncology over the past week and had a CT scan and stool studies done.  Also developed conjunctivitis to bilateral eyes which his oncologist think may be from immunotherapy.  Intermittent headaches but denies any headaches at this time.  Fever last week but denies any fever over the past couple of days.  No recent antibiotic use.  Multiple episodes of diarrhea today that have been nonbloody, states there is been 3 episodes.  Does endorse some mild shortness of breath with exertion.  Multiple doses of missed anticoagulation while in [place].     Physical Exam   Triage Vital Signs: ED Triage Vitals  Encounter Vitals Group     BP 05/15/23 0903 111/82     Systolic BP Percentile --      Diastolic BP Percentile --      Pulse Rate 05/15/23 0903 (!) 139     Resp 05/15/23 0903 (!) 21     Temp 05/15/23 0903 98.1 F (36.7 C)     Temp src --      SpO2 05/15/23 0903 97 %     Weight 05/15/23 0902 154 lb 1.6 oz (69.9 kg)     Height 05/15/23 0902 5\' 10"  (1.778 m)     Head Circumference --      Peak Flow --      Pain Score 05/15/23 0902 0     Pain Loc --      Pain Education --      Exclude from Growth Chart --     Most recent vital signs: Vitals:   05/15/23 1330 05/15/23 1430  BP: 115/80 117/77  Pulse: (!) 110 (!) 110  Resp:    Temp:    SpO2: 99% 99%     Physical Exam Constitutional:      Appearance: He is well-developed.  HENT:     Head: Atraumatic.  Eyes:     Comments: Bilateral injected conjunctiva.  Pupils are equal and reactive.  Cardiovascular:     Rate and Rhythm: Regular rhythm. Tachycardia present.     Comments: Port to the right chest wall Pulmonary:     Effort: Respiratory distress present.  Abdominal:     Palpations: Abdomen is soft.     Tenderness: There is no abdominal tenderness.  Musculoskeletal:     Cervical back: Normal range of motion.     Right lower leg: No edema.     Left lower leg: No edema.  Skin:    General: Skin is warm.     Capillary Refill: Capillary refill takes 2 to 3 seconds.  Neurological:     Mental Status: He is alert. Mental status is at baseline.  Psychiatric:        Mood and Affect: Mood normal.     IMPRESSION / MDM / ASSESSMENT AND PLAN / ED COURSE  I reviewed the triage vital signs and the nursing notes.  Differential diagnosis including dehydration, viral gastroenteritis, C. difficile, pulmonary embolism, infectious process  EKG  I, Corena Herter, the attending physician, personally viewed and interpreted this ECG.  Sinus cardia with a heart rate of 116.  Normal intervals.  No chamber enlargement.  Isolated T wave Letson verted to lead III.  No signs of acute ischemia or dysrhythmia  Sinus tachycardia while on cardiac telemetry.  RADIOLOGY I independently reviewed imaging, my interpretation of imaging: Chest x-ray no signs of pneumonia  LABS (all labs ordered are listed, but only abnormal results are displayed) Labs interpreted as -    Labs Reviewed  CBC WITH DIFFERENTIAL/PLATELET - Abnormal; Notable for the following components:      Result Value   HCT 38.9 (*)    Monocytes Absolute 2.4 (*)    Abs Immature Granulocytes 0.17 (*)    All other components within normal limits  COMPREHENSIVE METABOLIC PANEL - Abnormal; Notable for the following components:   Sodium  133 (*)    CO2 19 (*)    Calcium 8.2 (*)    Total Protein 6.1 (*)    Albumin 2.5 (*)    Total Bilirubin 1.7 (*)    All other components within normal limits  PROTIME-INR - Abnormal; Notable for the following components:   Prothrombin Time 19.5 (*)    INR 1.6 (*)    All other components within normal limits  APTT - Abnormal; Notable for the following components:   aPTT 58 (*)    All other components within normal limits  URINALYSIS, W/ REFLEX TO CULTURE (INFECTION SUSPECTED) - Abnormal; Notable for the following components:   Color, Urine YELLOW (*)    APPearance HAZY (*)    Glucose, UA 150 (*)    Ketones, ur 20 (*)    Protein, ur 30 (*)    All other components within normal limits  CULTURE, BLOOD (ROUTINE X 2)  CULTURE, BLOOD (ROUTINE X 2) W REFLEX TO ID PANEL  LIPASE, BLOOD  MAGNESIUM  LACTIC ACID, PLASMA  BRAIN NATRIURETIC PEPTIDE  TROPONIN I (HIGH SENSITIVITY)     MDM    On chart review patient had a recent CT scan that showed no signs of intra-abdominal abscess.  Recent GI pathogen panel and C. difficile that were negative.  Patient given multiple boluses of IV fluids given significant dehydration.  No obvious signs of an infectious process.  No significant electrolyte abnormality.  Mildly elevated T. bili.  Right upper quadrant ultrasound with layering and sludge with a 6 mm stone in the gallbladder but normal wall thickness and negative Murphy sign.  Have a low suspicion for acute cholecystitis.  On reevaluation continues to have tachycardia, appears dehydrated.  Multiple rounds of IV infusions this past week.  Patient did miss multiple doses of Eliquis with shortness of breath and ongoing tachycardia.  Ordered CT angiography for possible pulmonary embolism.  Consulted hospitalist for admission for severe dehydration in the setting of immunotherapy.  Discussed with ophthalmology - Dr. Rolley Sims - concern conjunctivitis is from immunomodulators and will reach out to  oncologist for further discussion.  Recommended Pred forte 1% 4 times daily, artificial tears 4 times a day, lubricant at nighttime.  Will evaluate tomorrow   PROCEDURES:  Critical Care performed: yes  .Critical Care  Performed by: Corena Herter, MD Authorized by: Corena Herter, MD   Critical care provider statement:    Critical care time (minutes):  30  Critical care time was exclusive of:  Separately billable procedures and treating other patients   Critical care was necessary to treat or prevent imminent or life-threatening deterioration of the following conditions:  Cardiac failure and dehydration   Critical care was time spent personally by me on the following activities:  Development of treatment plan with patient or surrogate, discussions with consultants, evaluation of patient's response to treatment, examination of patient, ordering and review of laboratory studies, ordering and review of radiographic studies, ordering and performing treatments and interventions, pulse oximetry, re-evaluation of patient's condition and review of old charts   Patient's presentation is most consistent with acute presentation with potential threat to life or bodily function.   MEDICATIONS ORDERED IN ED: Medications  prednisoLONE acetate (PRED FORTE) 1 % ophthalmic suspension 1 drop (has no administration in time range)  artificial tears (LACRILUBE) ophthalmic ointment (has no administration in time range)  polyvinyl alcohol (LIQUIFILM TEARS) 1.4 % ophthalmic solution 1 drop (has no administration in time range)  ondansetron (ZOFRAN) injection 4 mg (4 mg Intravenous Given 05/15/23 1035)  sodium chloride 0.9 % bolus 500 mL (0 mLs Intravenous Stopped 05/15/23 1141)  sodium chloride 0.9 % bolus 500 mL (0 mLs Intravenous Stopped 05/15/23 1310)  sodium chloride 0.9 % bolus 1,000 mL (1,000 mLs Intravenous New Bag/Given 05/15/23 1310)    FINAL CLINICAL IMPRESSION(S) / ED DIAGNOSES   Final  diagnoses:  Dehydration  Gastroenteritis  Acute conjunctivitis of both eyes, unspecified acute conjunctivitis type     Rx / DC Orders   ED Discharge Orders     None        Note:  This document was prepared using Dragon voice recognition software and may include unintentional dictation errors.   Corena Herter, MD 05/15/23 1422    Corena Herter, MD 05/15/23 [phone]

## 2023-05-15 NOTE — Progress Notes (Signed)
Patient arrived to unit in stable condition via bed

## 2023-05-15 NOTE — Plan of Care (Signed)
Patient is alert and oriented X 4. He has accessed port in his right side of chest. He  has conjunctivitis in his both eyes.his heart rate is 119 and running yellow mews. MD made aware. No any new order received. Plan of care is ongoing. Problem: Education: Goal: Knowledge of General Education information will improve Description: Including pain rating scale, medication(s)/side effects and non-pharmacologic comfort measures Outcome: Progressing   Problem: Health Behavior/Discharge Planning: Goal: Ability to manage health-related needs will improve Outcome: Progressing   Problem: Clinical Measurements: Goal: Ability to maintain clinical measurements within normal limits will improve Outcome: Progressing Goal: Will remain free from infection Outcome: Progressing Goal: Diagnostic test results will improve Outcome: Progressing Goal: Respiratory complications will improve Outcome: Progressing Goal: Cardiovascular complication will be avoided Outcome: Progressing   Problem: Activity: Goal: Risk for activity intolerance will decrease Outcome: Progressing   Problem: Nutrition: Goal: Adequate nutrition will be maintained Outcome: Progressing   Problem: Coping: Goal: Level of anxiety will decrease Outcome: Progressing   Problem: Elimination: Goal: Will not experience complications related to bowel motility Outcome: Progressing Goal: Will not experience complications related to urinary retention Outcome: Progressing   Problem: Pain Management: Goal: General experience of comfort will improve Outcome: Progressing   Problem: Safety: Goal: Ability to remain free from injury will improve Outcome: Progressing   Problem: Skin Integrity: Goal: Risk for impaired skin integrity will decrease Outcome: Progressing

## 2023-05-15 NOTE — Progress Notes (Signed)
Pt went for procedure via bed in stable condition.

## 2023-05-15 NOTE — Assessment & Plan Note (Signed)
On eliquis  Noted recent trip to [place] with some missed dose of Eliquis Pending CTA of the chest to reassess for any recurrence of pulmonary embolism No hypoxia present

## 2023-05-15 NOTE — Assessment & Plan Note (Signed)
BP stable Titrate home regimen 

## 2023-05-15 NOTE — Progress Notes (Signed)
Pt received from procedure via bed in stable condition.

## 2023-05-15 NOTE — ED Notes (Signed)
Dr. Alvester Morin, admitting provider at patient bedside.

## 2023-05-16 ENCOUNTER — Encounter: Payer: Self-pay | Admitting: Internal Medicine

## 2023-05-16 ENCOUNTER — Inpatient Hospital Stay: Payer: Federal, State, Local not specified - PPO

## 2023-05-16 ENCOUNTER — Inpatient Hospital Stay: Payer: Federal, State, Local not specified - PPO | Admitting: Internal Medicine

## 2023-05-16 DIAGNOSIS — K521 Toxic gastroenteritis and colitis: Secondary | ICD-10-CM | POA: Diagnosis not present

## 2023-05-16 DIAGNOSIS — I2694 Multiple subsegmental pulmonary emboli without acute cor pulmonale: Secondary | ICD-10-CM | POA: Diagnosis not present

## 2023-05-16 DIAGNOSIS — H1033 Unspecified acute conjunctivitis, bilateral: Secondary | ICD-10-CM

## 2023-05-16 DIAGNOSIS — R197 Diarrhea, unspecified: Secondary | ICD-10-CM | POA: Diagnosis not present

## 2023-05-16 DIAGNOSIS — E860 Dehydration: Principal | ICD-10-CM

## 2023-05-16 DIAGNOSIS — R112 Nausea with vomiting, unspecified: Secondary | ICD-10-CM | POA: Diagnosis not present

## 2023-05-16 DIAGNOSIS — Z79899 Other long term (current) drug therapy: Secondary | ICD-10-CM | POA: Diagnosis not present

## 2023-05-16 LAB — GASTROINTESTINAL PANEL BY PCR, STOOL (REPLACES STOOL CULTURE)

## 2023-05-16 LAB — C DIFFICILE QUICK SCREEN W PCR REFLEX
C Diff antigen: NEGATIVE
C Diff interpretation: NOT DETECTED
C Diff toxin: NEGATIVE

## 2023-05-16 LAB — GLUCOSE, CAPILLARY
Glucose-Capillary: 73 mg/dL (ref 70–99)
Glucose-Capillary: 149 mg/dL — ABNORMAL HIGH (ref 70–99)
Glucose-Capillary: 154 mg/dL — ABNORMAL HIGH (ref 70–99)

## 2023-05-16 LAB — CALPROTECTIN, FECAL: Calprotectin, Fecal: 4740 ug/g — ABNORMAL HIGH (ref 0–120)

## 2023-05-16 LAB — COMPREHENSIVE METABOLIC PANEL
ALT: 20 U/L (ref 0–44)
AST: 23 U/L (ref 15–41)
Albumin: 2.1 g/dL — ABNORMAL LOW (ref 3.5–5.0)
Alkaline Phosphatase: 37 U/L — ABNORMAL LOW (ref 38–126)
Anion gap: 10 (ref 5–15)
BUN: 10 mg/dL (ref 6–20)
CO2: 16 mmol/L — ABNORMAL LOW (ref 22–32)
Calcium: 7.6 mg/dL — ABNORMAL LOW (ref 8.9–10.3)
Chloride: 105 mmol/L (ref 98–111)
Creatinine, Ser: 0.91 mg/dL (ref 0.61–1.24)
GFR, Estimated: >60 mL/min (ref >60)
Glucose, Bld: 89 mg/dL (ref 70–99)
Potassium: 3.4 mmol/L — ABNORMAL LOW (ref 3.5–5.1)
Sodium: 131 mmol/L — ABNORMAL LOW (ref 135–145)
Total Bilirubin: 1.1 mg/dL (ref <1.2)
Total Protein: 5.4 g/dL — ABNORMAL LOW (ref 6.5–8.1)

## 2023-05-16 LAB — CBC
HCT: 32.4 % — ABNORMAL LOW (ref 39.0–52.0)
Hemoglobin: 11.3 g/dL — ABNORMAL LOW (ref 13.0–17.0)
MCH: 28.9 pg (ref 26.0–34.0)
MCHC: 34.9 g/dL (ref 30.0–36.0)
MCV: 82.9 fL (ref 80.0–100.0)
Platelets: 296 10*3/uL (ref 150–400)
RBC: 3.91 MIL/uL — ABNORMAL LOW (ref 4.22–5.81)
RDW: 14.6 % (ref 11.5–15.5)
WBC: 8.1 10*3/uL (ref 4.0–10.5)
nRBC: 0 % (ref 0.0–0.2)

## 2023-05-16 MED ORDER — HYDROCODONE-ACETAMINOPHEN 5-325 MG PO TABS: 1.0000 | ORAL_TABLET | Freq: Four times a day (QID) | ORAL | Status: DC | PRN

## 2023-05-16 MED ORDER — LOPERAMIDE HCL 2 MG PO CAPS
2.0000 mg | ORAL_CAPSULE | Freq: Once | ORAL | Status: AC
  Administered 2023-05-16: 2 mg via ORAL
  Filled 2023-05-16: qty 1

## 2023-05-16 MED ORDER — INSULIN ASPART 100 UNIT/ML IJ SOLN: 0.0000 [IU] | Freq: Every day | INTRAMUSCULAR | Status: DC

## 2023-05-16 MED ORDER — ATORVASTATIN CALCIUM 20 MG PO TABS
20.0000 mg | ORAL_TABLET | Freq: Every day | ORAL | Status: DC
  Administered 2023-05-16 – 2023-05-20 (×5): 20 mg via ORAL
  Filled 2023-05-16 (×5): qty 1

## 2023-05-16 MED ORDER — CIPROFLOXACIN HCL 0.3 % OP SOLN
1.0000 [drp] | OPHTHALMIC | Status: DC
Start: 2023-05-16 — End: 2023-05-16
  Filled 2023-05-16: qty 2.5

## 2023-05-16 MED ORDER — METHYLPREDNISOLONE SODIUM SUCC 125 MG IJ SOLR
1.0000 mg/kg | Freq: Every day | INTRAMUSCULAR | Status: AC
Start: 2023-05-16 — End: 2023-05-19
  Administered 2023-05-16 – 2023-05-18 (×3): 70 mg via INTRAVENOUS
  Filled 2023-05-16 (×3): qty 2

## 2023-05-16 MED ORDER — APIXABAN 5 MG PO TABS
5.0000 mg | ORAL_TABLET | Freq: Two times a day (BID) | ORAL | Status: DC
  Administered 2023-05-16 – 2023-05-17 (×3): 5 mg via ORAL
  Filled 2023-05-16 (×3): qty 1

## 2023-05-16 MED ORDER — INSULIN ASPART 100 UNIT/ML IJ SOLN
0.0000 [IU] | Freq: Three times a day (TID) | INTRAMUSCULAR | Status: DC
  Administered 2023-05-16 – 2023-05-18 (×6): 1 [IU] via SUBCUTANEOUS
  Filled 2023-05-16 (×7): qty 1

## 2023-05-16 MED ORDER — SODIUM CHLORIDE 0.45 % IV SOLN
INTRAVENOUS | Status: AC
  Filled 2023-05-16 (×3): qty 75

## 2023-05-16 MED ORDER — METOPROLOL TARTRATE 25 MG PO TABS
25.0000 mg | ORAL_TABLET | Freq: Two times a day (BID) | ORAL | Status: DC
  Administered 2023-05-16 – 2023-05-19 (×8): 25 mg via ORAL
  Filled 2023-05-16 (×9): qty 1

## 2023-05-16 MED ORDER — LOPERAMIDE HCL 2 MG PO CAPS
4.0000 mg | ORAL_CAPSULE | Freq: Four times a day (QID) | ORAL | Status: DC | PRN
  Administered 2023-05-16 – 2023-05-18 (×6): 4 mg via ORAL
  Filled 2023-05-16 (×6): qty 2

## 2023-05-16 NOTE — Progress Notes (Signed)
PROGRESS NOTE    SIDDIQ MAHARREY  ZOX:[id number] DOB: [birth year] DOA: 05/15/2023 PCP: [name], MD  Outpatient Specialists: oncology    Brief Narrative:   From admission h and p  DEVEAN HEMPEL is a 52 y.o. male with medical history significant of renal cell carcinoma status post nephrectomy with metastasis to the bone, HFrEF, bilateral PE, iron deficiency anemia, GERD, hypertension presenting with intractable nausea vomiting.  Patient reports recurrent nausea vomiting over the past 1 to 2 weeks.  Noted to be followed by Dr. Michae Kava outpatient for renal cell carcinoma.  Noted to be status post nephrectomy with metastasis to the bone on immunotherapy as well as radiation.  Positive generalized weakness and fatigue.  Was evaluated by Dr. Michae Kava.  Symptoms felt to be related to immunotherapy.  No fevers or chills.  Mild abdominal pain.  Mild nonbloody nonbilious diarrhea.  No focal hemiparesis or confusion.  Baseline pulmonary embolism on Eliquis.  Noted to have recent travel to [place].  Missed some doses of Eliquis.  No chest pain or shortness of breath at present.   Assessment & Plan:   Principal Problem:   Intractable nausea and vomiting Active Problems:   Multiple subsegmental pulmonary emboli without acute cor pulmonale (HCC)   GERD   OSA (obstructive sleep apnea)   Hypertension associated with diabetes (HCC)   Controlled type 2 diabetes mellitus without complication, without long-term current use of insulin (HCC)   Renal cell cancer (HCC)   Left ventricular systolic dysfunction (LVSD)   Conjunctivitis  # Nausea, vomiting, diarrhea Recent gi pathogen panel and c diff negative. CT from a few days ago showing enteritis. Oncology thinks probably side effect of immunotherapy.  - holding home immunotherapy - repeat gi pathogen panel and c diff - oncology has started methylpred - anti-diarrheals and anti-emetics - f/u blood cultures - additional w/u if fails to improve  with above interventions  # Metabolic acidosis 2/2 dehydration - switch fluids to bicarb  # Red eye Bilateral, with red flags of photosensitivity and eye pain. Improving with prednisolone. I spoke w/ dr. Inez Pilgrim of ophtho today, he is concerned for iritis given immune suppression and red flag symptoms. He advises holding abx, continuing prednisolone eye drops, and close f/u in his clinic this week, they will see him in the hospital this week if he doesn't discharge in the next day or two - continue prednisolone eye drops  # RCC S/p resection, on immunotherapy as above - holding immunotherapy  # HTN Here bp wnl in setting ov vomiting/diarrhea - home losartan on hold - cont home metop  # HFrEF Appears euvolemic. Ef 40-45 in august   - cont home metop  # T2DM Glucose controlled - SSI   DVT prophylaxis: apixaban Code Status: full Family Communication: none @ bedside  Level of care: Med-Surg Status is: Inpatient Remains inpatient appropriate because: severity of illness    Consultants:  none  Procedures: none  Antimicrobials:  none    Subjective: Reports no pain but ongoing n/v/d  Objective: Vitals:   05/15/23 1632 05/15/23 2006 05/16/23 0520 05/16/23 0741  BP: 114/75 (!) 142/85 109/70 105/62  Pulse: (!) 119  (!) 110 (!) 110  Resp: 17 18 20 16   Temp: 99.1 F (37.3 C) 99 F (37.2 C) 99.5 F (37.5 C) 99.5 F (37.5 C)  TempSrc: Oral Oral  Oral  SpO2: 98% 100% 100% 100%  Weight:      Height:        Intake/Output  Summary (Last 24 hours) at 05/16/2023 0937 Last data filed at 05/16/2023 0508 Gross per 24 hour  Intake 3015.25 ml  Output --  Net 3015.25 ml   Filed Weights   05/15/23 0902  Weight: 69.9 kg    Examination:  General exam: Appears calm and comfortable  Respiratory system: Clear to auscultation. Respiratory effort normal. Cardiovascular system: S1 & S2 heard, RRR. No JVD, murmurs, rubs, gallops or clicks. No pedal  edema. Gastrointestinal system: Abdomen is nondistended, soft and nontender. No organomegaly or masses felt. Normal bowel sounds heard. Central nervous system: Alert and oriented. No focal neurological deficits. Extremities: Symmetric 5 x 5 power. Skin: No rashes, lesions or ulcers. Right chest port Psychiatry: Judgement and insight appear normal. Mood & affect appropriate.     Data Reviewed: I have personally reviewed following labs and imaging studies  CBC: Recent Labs  Lab 05/11/23 1344 05/13/23 0832 05/15/23 1016 05/16/23 0530  WBC 9.4 9.2 9.1 8.1  NEUTROABS 4.9 4.9 5.5  --   HGB 14.1 13.5 13.4 11.3*  HCT 40.9 39.1 38.9* 32.4*  MCV 83.8 82.8 84.4 82.9  PLT 245 271 283 296   Basic Metabolic Panel: Recent Labs  Lab 05/11/23 1344 05/13/23 0832 05/15/23 1016 05/16/23 0530  NA 131* 132* 133* 131*  K 3.5 3.4* 3.7 3.4*  CL 98 101 101 105  CO2 21* 21* 19* 16*  GLUCOSE 96 107* 74 89  BUN 15 12 12 10   CREATININE 1.05 1.05 0.95 0.91  CALCIUM 8.3* 8.0* 8.2* 7.6*  MG  --  1.8 1.9  --    GFR: Estimated Creatinine Clearance: 91.7 mL/min (by C-G formula based on SCr of 0.91 mg/dL). Liver Function Tests: Recent Labs  Lab 05/11/23 1344 05/13/23 0832 05/15/23 1016 05/16/23 0530  AST 18 20 22 23   ALT 18 18 22 20   ALKPHOS 46 39 44 37*  BILITOT 1.8* 1.6* 1.7* 1.1  PROT 6.9 6.3* 6.1* 5.4*  ALBUMIN 3.1* 2.9* 2.5* 2.1*   Recent Labs  Lab 05/15/23 1016  LIPASE 35   No results for input(s): "AMMONIA" in the last 168 hours. Coagulation Profile: Recent Labs  Lab 05/15/23 1016  INR 1.6*   Cardiac Enzymes: No results for input(s): "CKTOTAL", "CKMB", "CKMBINDEX", "TROPONINI" in the last 168 hours. BNP (last 3 results) No results for input(s): "PROBNP" in the last 8760 hours. HbA1C: No results for input(s): "HGBA1C" in the last 72 hours. CBG: No results for input(s): "GLUCAP" in the last 168 hours. Lipid Profile: No results for input(s): "CHOL", "HDL", "LDLCALC",  "TRIG", "CHOLHDL", "LDLDIRECT" in the last 72 hours. Thyroid Function Tests: No results for input(s): "TSH", "T4TOTAL", "FREET4", "T3FREE", "THYROIDAB" in the last 72 hours. Anemia Panel: No results for input(s): "VITAMINB12", "FOLATE", "FERRITIN", "TIBC", "IRON", "RETICCTPCT" in the last 72 hours. Urine analysis:    Component Value Date/Time   COLORURINE YELLOW (A) 05/15/2023 1409   APPEARANCEUR HAZY (A) 05/15/2023 1409   LABSPEC 1.021 05/15/2023 1409   PHURINE 5.0 05/15/2023 1409   GLUCOSEU 150 (A) 05/15/2023 1409   GLUCOSEU >=1000 (A) 02/22/2023 0947   HGBUR NEGATIVE 05/15/2023 1409   BILIRUBINUR NEGATIVE 05/15/2023 1409   BILIRUBINUR Negative 10/14/2022 0900   KETONESUR 20 (A) 05/15/2023 1409   PROTEINUR 30 (A) 05/15/2023 1409   UROBILINOGEN 0.2 02/22/2023 0947   NITRITE NEGATIVE 05/15/2023 1409   LEUKOCYTESUR NEGATIVE 05/15/2023 1409   Sepsis Labs: @LABRCNTIP (procalcitonin:4,lacticidven:4)  ) Recent Results (from the past 240 hour(s))  Respiratory (~20 pathogens) panel by PCR  Status: None   Collection Time: 05/11/23  3:20 PM   Specimen: Nasopharyngeal Swab; Respiratory  Result Value Ref Range Status   Adenovirus NOT DETECTED NOT DETECTED Final   Coronavirus 229E NOT DETECTED NOT DETECTED Final    Comment: (NOTE) The Coronavirus on the Respiratory Panel, DOES NOT test for the novel  Coronavirus (2019 nCoV)    Coronavirus HKU1 NOT DETECTED NOT DETECTED Final   Coronavirus NL63 NOT DETECTED NOT DETECTED Final   Coronavirus OC43 NOT DETECTED NOT DETECTED Final   Metapneumovirus NOT DETECTED NOT DETECTED Final   Rhinovirus / Enterovirus NOT DETECTED NOT DETECTED Final   Influenza A NOT DETECTED NOT DETECTED Final   Influenza B NOT DETECTED NOT DETECTED Final   Parainfluenza Virus 1 NOT DETECTED NOT DETECTED Final   Parainfluenza Virus 2 NOT DETECTED NOT DETECTED Final   Parainfluenza Virus 3 NOT DETECTED NOT DETECTED Final   Parainfluenza Virus 4 NOT DETECTED NOT  DETECTED Final   Respiratory Syncytial Virus NOT DETECTED NOT DETECTED Final   Bordetella pertussis NOT DETECTED NOT DETECTED Final   Bordetella Parapertussis NOT DETECTED NOT DETECTED Final   Chlamydophila pneumoniae NOT DETECTED NOT DETECTED Final   Mycoplasma pneumoniae NOT DETECTED NOT DETECTED Final    Comment: Performed at [facility], [address], [place], [place] [address]  Urine culture     Status: None   Collection Time: 05/11/23  3:20 PM   Specimen: Urine, Clean Catch  Result Value Ref Range Status   Specimen Description   Final    URINE, CLEAN CATCH Performed at [facility], [address] [place], [place] [address]    Special Requests   Final    NONE Performed at [facility], [address] [place], [place] [address]    Culture   Final    NO GROWTH Performed at [facility], [address], [place], [place] [address]    Report Status 05/12/2023 FINAL  Final  C difficile quick screen w PCR reflex     Status: None   Collection Time: 05/13/23 11:05 AM   Specimen: STOOL  Result Value Ref Range Status   C Diff antigen NEGATIVE NEGATIVE Final   C Diff toxin NEGATIVE NEGATIVE Final   C Diff interpretation No C. difficile detected.  Final    Comment: Performed at [facility], [address] [place], [place] [address]  Gastrointestinal Panel by PCR , Stool     Status: None   Collection Time: 05/13/23 11:05 AM   Specimen: Stool  Result Value Ref Range Status   Campylobacter species NOT DETECTED NOT DETECTED Final   Plesimonas shigelloides NOT DETECTED NOT DETECTED Final   Salmonella species NOT DETECTED NOT DETECTED Final   Yersinia enterocolitica NOT DETECTED NOT DETECTED Final   Vibrio species NOT DETECTED NOT DETECTED Final   Vibrio cholerae NOT DETECTED NOT DETECTED Final   Enteroaggregative E coli (EAEC) NOT DETECTED NOT DETECTED Final   Enteropathogenic E coli (EPEC) NOT DETECTED NOT DETECTED Final   Enterotoxigenic E  coli (ETEC) NOT DETECTED NOT DETECTED Final   Shiga like toxin producing E coli (STEC) NOT DETECTED NOT DETECTED Final   Shigella/Enteroinvasive E coli (EIEC) NOT DETECTED NOT DETECTED Final   Cryptosporidium NOT DETECTED NOT DETECTED Final   Cyclospora cayetanensis NOT DETECTED NOT DETECTED Final   Entamoeba histolytica NOT DETECTED NOT DETECTED Final   Giardia lamblia NOT DETECTED NOT DETECTED Final   Adenovirus F40/41 NOT DETECTED NOT DETECTED  Final   Astrovirus NOT DETECTED NOT DETECTED Final   Norovirus GI/GII NOT DETECTED NOT DETECTED Final   Rotavirus A NOT DETECTED NOT DETECTED Final   Sapovirus (I, II, IV, and V) NOT DETECTED NOT DETECTED Final    Comment: Performed at [facility], [address], [place], [place] [address]         Radiology Studies: CT Angio Chest PE W/Cm &/Or Wo Cm  Result Date: 05/15/2023 CLINICAL DATA:  Suspected pulmonary embolism. EXAM: CT ANGIOGRAPHY CHEST WITH CONTRAST TECHNIQUE: Multidetector CT imaging of the chest was performed using the standard protocol during bolus administration of intravenous contrast. Multiplanar CT image reconstructions and MIPs were obtained to evaluate the vascular anatomy. RADIATION DOSE REDUCTION: This exam was performed according to the departmental dose-optimization program which includes automated exposure control, adjustment of the mA and/or kV according to patient size and/or use of iterative reconstruction technique. CONTRAST:  75mL OMNIPAQUE IOHEXOL 350 MG/ML SOLN COMPARISON:  May 02, 2023 FINDINGS: Cardiovascular: A right-sided venous Port-A-Cath is in place. There is mild calcification of the aortic arch, without evidence of an aortic aneurysm. Satisfactory opacification of the pulmonary arteries to the segmental level. No evidence of pulmonary embolism. Normal heart size. No pericardial effusion. Mediastinum/Nodes: No enlarged mediastinal, hilar, or axillary lymph nodes. Thyroid gland, trachea, and  esophagus demonstrate no significant findings. Lungs/Pleura: Lungs are clear. No pleural effusion or pneumothorax. Upper Abdomen: The left kidney is surgically absent. Musculoskeletal: No chest wall abnormality. No acute or significant osseous findings. Review of the MIP images confirms the above findings. IMPRESSION: 1. No evidence of pulmonary embolism or acute cardiopulmonary disease. 2. Evidence of prior left nephrectomy. 3. Aortic atherosclerosis. Aortic Atherosclerosis (ICD10-I70.0). Electronically Signed   By: Aram Candela M.D.   On: 05/15/2023 18:16   US ABDOMEN LIMITED RUQ (LIVER/GB)  Result Date: 05/15/2023 CLINICAL DATA:  5 day history of right upper quadrant pain. Prior left nephrectomy. EXAM: ULTRASOUND ABDOMEN LIMITED RIGHT UPPER QUADRANT COMPARISON:  05/13/2023 FINDINGS: Gallbladder: Layering sludge identified in the gallbladder with a 6 mm shadowing stone evident. Gallbladder wall thickness upper normal to 3 mm. No sonographic Murphy sign. Common bile duct: Diameter: 2-3 mm Liver: Left hepatic lobe is obscured by bowel gas. Liver otherwise unremarkable. Portal vein is patent on color Doppler imaging with normal direction of blood flow towards the liver. Other: None. IMPRESSION: 1. Layering sludge and 6 mm shadowing stone in the gallbladder. Gallbladder wall thickness upper normal to 3 mm. No sonographic Murphy sign. 2. Left hepatic lobe is obscured by bowel gas. Electronically Signed   By: Kennith Center M.D.   On: 05/15/2023 13:38   DG Chest Port 1 View  Result Date: 05/15/2023 CLINICAL DATA:  Questionable sepsis. History of renal cell carcinoma. EXAM: PORTABLE CHEST 1 VIEW COMPARISON:  12/22/2022 FINDINGS: Right chest wall port a catheter is identified with tip in the projection of the superior cavoatrial junction. Heart size and mediastinal contours are unremarkable. No pleural fluid or interstitial edema. No airspace opacities identified. Visualized osseous structures are  unremarkable. IMPRESSION: No acute cardiopulmonary abnormalities. Electronically Signed   By: Signa Kell M.D.   On: 05/15/2023 11:04        Scheduled Meds:  Chlorhexidine Gluconate Cloth  6 each Topical Daily   feeding supplement  237 mL Oral BID BM   methylPREDNISolone (SOLU-MEDROL) injection  1 mg/kg Intravenous Daily   polyvinyl alcohol  1 drop Both Eyes QID   prednisoLONE acetate  1 drop Both Eyes QID  Continuous Infusions:  sodium chloride 100 mL/hr at 05/16/23 0505     LOS: 1 day     Silvano Bilis, MD [facility]   If 7PM-7AM, please contact night-coverage [email or web address] Password [facility] 05/16/2023, 9:37 AM
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
## 2023-05-16 NOTE — TOC Initial Note (Signed)
Transition of Care [facility]) - Initial/Assessment Note    Patient Details  Name: Paul Flowers MRN: [id number] Date of Birth: 07/22/1970  [facility]) CM/SW Contact:    Allena Katz, LCSW Phone Number: 05/16/2023, 4:02 PM  Clinical Narrative:   Pt admitted with nausea and vomiting and COVID-19 from home with wife. Pt independent in room no PT/OT needs. Pt active with Dr. Sanda Linger for primary care. Pt currently receiving chemotherapy. No TOC needs as of current. TOC will continue to follow.                  Expected Discharge Plan: Home/Self Care Barriers to Discharge: Continued Medical Work up   Patient Goals and CMS Choice Patient states their goals for this hospitalization and ongoing recovery are:: return home          Expected Discharge Plan and Services       Living arrangements for the past 2 months: Single Family Home                                      Prior Living Arrangements/Services Living arrangements for the past 2 months: Single Family Home Lives with:: Spouse                   Activities of Daily Living   ADL Screening (condition at time of admission) Independently performs ADLs?: Yes (appropriate for developmental age) Is the patient deaf or have difficulty hearing?: No Does the patient have difficulty seeing, even when wearing glasses/contacts?: No Does the patient have difficulty concentrating, remembering, or making decisions?: No  Permission Sought/Granted                  Emotional Assessment       Orientation: : Oriented to Self, Oriented to Place, Oriented to  Time, Oriented to Situation      Admission diagnosis:  Dehydration [E86.0] Gastroenteritis [K52.9] Intractable nausea and vomiting [R11.2] Acute conjunctivitis of both eyes, unspecified acute conjunctivitis type [H10.33] Patient Active Problem List   Diagnosis Date Noted   Drug-induced colitis 05/16/2023   Dehydration 05/16/2023    Intractable nausea and vomiting 05/15/2023   Diarrhea 05/13/2023   Nausea with vomiting 05/13/2023   Conjunctivitis 05/13/2023   Encounter for general adult medical examination with abnormal findings 02/22/2023   Metastasis to bone (HCC) 02/03/2023   Encounter for antineoplastic immunotherapy 01/13/2023   Left ventricular systolic dysfunction (LVSD) 01/06/2023   Renal cell cancer (HCC) 12/30/2022   Abnormal echocardiogram 12/23/2022   Multiple subsegmental pulmonary emboli without acute cor pulmonale (HCC) 12/19/2022   Iron deficiency anemia 11/02/2022   Nodule of lower lobe of left lung 10/19/2022   Current moderate episode of major depressive disorder without prior episode (HCC) 03/28/2019   Urinary frequency 07/26/2016   Hyperlipidemia associated with type 2 diabetes mellitus (HCC) 06/11/2015   Controlled type 2 diabetes mellitus without complication, without long-term current use of insulin (HCC) 09/24/2014   Hypertension associated with diabetes (HCC) 11/09/2013   OSA (obstructive sleep apnea) 03/19/2011   Allergic rhinitis 01/19/2007   GERD 01/19/2007   PCP:  [name], MD Pharmacy:   [facility] [phone] - [address] [place] - [address] [place] [address] Phone: [phone] Fax: [phone]     Social Determinants of Health (SDOH) Social History: SDOH Screenings   Food Insecurity: No Food Insecurity (05/15/2023)  Housing: Low  Risk  (05/15/2023)  Transportation Needs: No Transportation Needs (05/15/2023)  Utilities: Not At Risk (05/15/2023)  Alcohol Screen: Low Risk  (10/14/2022)  Depression (PHQ2-9): Low Risk  (07/19/2022)  Financial Resource Strain: Low Risk  (10/14/2022)  Physical Activity: Insufficiently Active (10/14/2022)  Social Connections: Moderately Integrated (10/14/2022)  Stress: No Stress Concern Present (10/14/2022)  Tobacco Use: Low Risk  (05/15/2023)   SDOH Interventions:     Readmission Risk Interventions     05/16/2023    3:59 PM 12/20/2022   11:59 AM 12/16/2022    9:11 AM  Readmission Risk Prevention Plan  Post Dischage Appt   Complete  Medication Screening   Complete  Transportation Screening Complete Complete Complete  PCP or Specialist Appt within 5-7 Days  Complete   PCP or Specialist Appt within 3-5 Days Complete    Home Care Screening  Complete   Medication Review (RN CM)  Complete   HRI or Home Care Consult Complete    Social Work Consult for Recovery Care Planning/Counseling Complete    Palliative Care Screening Complete    Medication Review Oceanographer) Complete

## 2023-05-16 NOTE — Progress Notes (Signed)
Patient's Primary RN on lunch break. This RN covering for this patient while she is on break.   Patient states he has had 5 BM this shift and he states it is diarrhea. He is requesting medication to help stop his frequent BMs. Cdif test 05/13/23 is negative    RN notified Larkin Ina, NP of the above information.RN notified patient's primary RN as well of the above information.  Orders Received: notify NP if patient has more BMs between now and 0500.

## 2023-05-16 NOTE — Plan of Care (Signed)

## 2023-05-17 DIAGNOSIS — H1033 Unspecified acute conjunctivitis, bilateral: Secondary | ICD-10-CM | POA: Diagnosis not present

## 2023-05-17 DIAGNOSIS — Z79899 Other long term (current) drug therapy: Secondary | ICD-10-CM | POA: Diagnosis not present

## 2023-05-17 DIAGNOSIS — I2694 Multiple subsegmental pulmonary emboli without acute cor pulmonale: Secondary | ICD-10-CM | POA: Diagnosis not present

## 2023-05-17 DIAGNOSIS — R197 Diarrhea, unspecified: Secondary | ICD-10-CM

## 2023-05-17 DIAGNOSIS — E860 Dehydration: Secondary | ICD-10-CM | POA: Diagnosis not present

## 2023-05-17 DIAGNOSIS — E440 Moderate protein-calorie malnutrition: Secondary | ICD-10-CM | POA: Insufficient documentation

## 2023-05-17 DIAGNOSIS — R112 Nausea with vomiting, unspecified: Secondary | ICD-10-CM | POA: Diagnosis not present

## 2023-05-17 LAB — BASIC METABOLIC PANEL
Anion gap: 6 (ref 5–15)
BUN: 9 mg/dL (ref 6–20)
CO2: 20 mmol/L — ABNORMAL LOW (ref 22–32)
Calcium: 7.9 mg/dL — ABNORMAL LOW (ref 8.9–10.3)
Chloride: 108 mmol/L (ref 98–111)
Creatinine, Ser: 0.75 mg/dL (ref 0.61–1.24)
GFR, Estimated: >60 mL/min (ref >60)
Glucose, Bld: 144 mg/dL — ABNORMAL HIGH (ref 70–99)
Potassium: 3.3 mmol/L — ABNORMAL LOW (ref 3.5–5.1)
Sodium: 134 mmol/L — ABNORMAL LOW (ref 135–145)

## 2023-05-17 LAB — GLUCOSE, CAPILLARY
Glucose-Capillary: 131 mg/dL — ABNORMAL HIGH (ref 70–99)
Glucose-Capillary: 144 mg/dL — ABNORMAL HIGH (ref 70–99)
Glucose-Capillary: 132 mg/dL — ABNORMAL HIGH (ref 70–99)
Glucose-Capillary: 121 mg/dL — ABNORMAL HIGH (ref 70–99)

## 2023-05-17 LAB — HEPATITIS B CORE ANTIBODY, IGM: Hep B C IgM: NONREACTIVE

## 2023-05-17 LAB — HEPATITIS B CORE ANTIBODY, TOTAL: Hep B Core Total Ab: NONREACTIVE

## 2023-05-17 LAB — C-REACTIVE PROTEIN: CRP: 14.3 mg/dL — ABNORMAL HIGH (ref <1.0)

## 2023-05-17 LAB — HEPATITIS B SURFACE ANTIGEN: Hepatitis B Surface Ag: NONREACTIVE

## 2023-05-17 LAB — HEPATITIS C ANTIBODY: HCV Ab: NONREACTIVE

## 2023-05-17 NOTE — Plan of Care (Signed)
  Problem: Education: Goal: Knowledge of General Education information will improve Description Including pain rating scale, medication(s)/side effects and non-pharmacologic comfort measures Outcome: Progressing   

## 2023-05-17 NOTE — Progress Notes (Signed)
PROGRESS NOTE    Paul Flowers  ZOX:[id number] DOB: Aug 01, 1970 DOA: 05/15/2023 PCP: [name], MD  Outpatient Specialists: oncology    Brief Narrative:   From admission h and p  Paul Flowers is a 52 y.o. male with medical history significant of renal cell carcinoma status post nephrectomy with metastasis to the bone, HFrEF, bilateral PE, iron deficiency anemia, GERD, hypertension presenting with intractable nausea vomiting.  Patient reports recurrent nausea vomiting over the past 1 to 2 weeks.  Noted to be followed by Dr. Michae Kava outpatient for renal cell carcinoma.  Noted to be status post nephrectomy with metastasis to the bone on immunotherapy as well as radiation.  Positive generalized weakness and fatigue.  Was evaluated by Dr. Michae Kava.  Symptoms felt to be related to immunotherapy.  No fevers or chills.  Mild abdominal pain.  Mild nonbloody nonbilious diarrhea.  No focal hemiparesis or confusion.  Baseline pulmonary embolism on Eliquis.  Noted to have recent travel to [place].  Missed some doses of Eliquis.  No chest pain or shortness of breath at present.   Assessment & Plan:   Principal Problem:   Intractable nausea and vomiting Active Problems:   Multiple subsegmental pulmonary emboli without acute cor pulmonale (HCC)   GERD   OSA (obstructive sleep apnea)   Hypertension associated with diabetes (HCC)   Controlled type 2 diabetes mellitus without complication, without long-term current use of insulin (HCC)   Renal cell cancer (HCC)   Left ventricular systolic dysfunction (LVSD)   Conjunctivitis   Drug-induced colitis   Dehydration   High risk medication use   Malnutrition of moderate degree  # Nausea, vomiting, diarrhea # Immunotherapy colitis Recent gi pathogen panel and c diff negative. CT from a few days ago showing enteritis. Oncology thinks probably side effect of immunotherapy. Today symptoms improving - holding home immunotherapy - oncology has  started methylpred, continuing that - anti-diarrheals and anti-emetics - f/u blood cultures - GI planning colonoscopy with biopsies if diarrhea persists (on Thursday). Quantiferon and hep b ordered  # Metabolic acidosis 2/2 dehydration. improving - continue bicarb infusion for now  # Red eye Bilateral, with red flags of photosensitivity and eye pain. Improving with prednisolone. I spoke w/ dr. Inez Pilgrim of ophtho 11/11, he is concerned for iritis given immune suppression and red flag symptoms. He advises holding abx, continuing prednisolone eye drops, and close f/u in his clinic this week, they will see him in the hospital this week if he doesn't discharge in the next day or two - continue prednisolone eye drops - would contact optho for in-hospital eval if doesn't discharge tomorrow  # PE Diagnosed in June, has been compliant w/ apixaban. Asymptomatic - apixaban on hold pending possible colonoscopy Thursday  # RCC S/p resection, on immunotherapy as above - holding immunotherapy  # HTN Here bp wnl in setting ov vomiting/diarrhea - home losartan on hold - cont home metop  # HFrEF Appears euvolemic. Ef 40-45 in august   - cont home metop  # T2DM Glucose controlled - SSI   DVT prophylaxis: SCDs Code Status: full Family Communication: mother updated @ bedside 11/12  Level of care: Med-Surg Status is: Inpatient Remains inpatient appropriate because: severity of illness    Consultants:  Gi, onc  Procedures: none  Antimicrobials:  none    Subjective: N/v/d much improved, tolerating lunch, no abdominal pain  Objective: Vitals:   05/16/23 1954 05/17/23 0010 05/17/23 0451 05/17/23 0757  BP: 107/70 114/73 106/74 109/83  Pulse: (!) 105 91 79 93  Resp:  18 18 18   Temp: 99.5 F (37.5 C) 98.2 F (36.8 C) 98.4 F (36.9 C) 98 F (36.7 C)  TempSrc: Oral Oral Oral   SpO2: 98% 98% 100% 100%  Weight:      Height:        Intake/Output Summary (Last 24 hours) at  05/17/2023 1343 Last data filed at 05/17/2023 1118 Gross per 24 hour  Intake 1714.17 ml  Output --  Net 1714.17 ml   Filed Weights   05/15/23 0902  Weight: 69.9 kg    Examination:  General exam: Appears calm and comfortable  Respiratory system: Clear to auscultation. Respiratory effort normal. Cardiovascular system: S1 & S2 heard, RRR. No JVD, murmurs, rubs, gallops or clicks. No pedal edema. Gastrointestinal system: Abdomen is nondistended, soft and nontender. No organomegaly or masses felt. Normal bowel sounds heard. Central nervous system: Alert and oriented. No focal neurological deficits. Extremities: Symmetric 5 x 5 power. Skin: No rashes, lesions or ulcers. Right chest port Psychiatry: Judgement and insight appear normal. Mood & affect appropriate.     Data Reviewed: I have personally reviewed following labs and imaging studies  CBC: Recent Labs  Lab 05/11/23 1344 05/13/23 0832 05/15/23 1016 05/16/23 0530  WBC 9.4 9.2 9.1 8.1  NEUTROABS 4.9 4.9 5.5  --   HGB 14.1 13.5 13.4 11.3*  HCT 40.9 39.1 38.9* 32.4*  MCV 83.8 82.8 84.4 82.9  PLT 245 271 283 296   Basic Metabolic Panel: Recent Labs  Lab 05/11/23 1344 05/13/23 0832 05/15/23 1016 05/16/23 0530 05/17/23 0500  NA 131* 132* 133* 131* 134*  K 3.5 3.4* 3.7 3.4* 3.3*  CL 98 101 101 105 108  CO2 21* 21* 19* 16* 20*  GLUCOSE 96 107* 74 89 144*  BUN 15 12 12 10 9   CREATININE 1.05 1.05 0.95 0.91 0.75  CALCIUM 8.3* 8.0* 8.2* 7.6* 7.9*  MG  --  1.8 1.9  --   --    GFR: Estimated Creatinine Clearance: 104.4 mL/min (by C-G formula based on SCr of 0.75 mg/dL). Liver Function Tests: Recent Labs  Lab 05/11/23 1344 05/13/23 0832 05/15/23 1016 05/16/23 0530  AST 18 20 22 23   ALT 18 18 22 20   ALKPHOS 46 39 44 37*  BILITOT 1.8* 1.6* 1.7* 1.1  PROT 6.9 6.3* 6.1* 5.4*  ALBUMIN 3.1* 2.9* 2.5* 2.1*   Recent Labs  Lab 05/15/23 1016  LIPASE 35   No results for input(s): "AMMONIA" in the last 168  hours. Coagulation Profile: Recent Labs  Lab 05/15/23 1016  INR 1.6*   Cardiac Enzymes: No results for input(s): "CKTOTAL", "CKMB", "CKMBINDEX", "TROPONINI" in the last 168 hours. BNP (last 3 results) No results for input(s): "PROBNP" in the last 8760 hours. HbA1C: No results for input(s): "HGBA1C" in the last 72 hours. CBG: Recent Labs  Lab 05/16/23 1125 05/16/23 1658 05/16/23 1955 05/17/23 0758 05/17/23 1112  GLUCAP 73 149* 154* 131* 144*   Lipid Profile: No results for input(s): "CHOL", "HDL", "LDLCALC", "TRIG", "CHOLHDL", "LDLDIRECT" in the last 72 hours. Thyroid Function Tests: No results for input(s): "TSH", "T4TOTAL", "FREET4", "T3FREE", "THYROIDAB" in the last 72 hours. Anemia Panel: No results for input(s): "VITAMINB12", "FOLATE", "FERRITIN", "TIBC", "IRON", "RETICCTPCT" in the last 72 hours. Urine analysis:    Component Value Date/Time   COLORURINE YELLOW (A) 05/15/2023 1409   APPEARANCEUR HAZY (A) 05/15/2023 1409   LABSPEC 1.021 05/15/2023 1409   PHURINE 5.0 05/15/2023 1409   GLUCOSEU  150 (A) 05/15/2023 1409   GLUCOSEU >=1000 (A) 02/22/2023 0947   HGBUR NEGATIVE 05/15/2023 1409   BILIRUBINUR NEGATIVE 05/15/2023 1409   BILIRUBINUR Negative 10/14/2022 0900   KETONESUR 20 (A) 05/15/2023 1409   PROTEINUR 30 (A) 05/15/2023 1409   UROBILINOGEN 0.2 02/22/2023 0947   NITRITE NEGATIVE 05/15/2023 1409   LEUKOCYTESUR NEGATIVE 05/15/2023 1409   Sepsis Labs: @LABRCNTIP (procalcitonin:4,lacticidven:4)  ) Recent Results (from the past 240 hour(s))  Respiratory (~20 pathogens) panel by PCR     Status: None   Collection Time: 05/11/23  3:20 PM   Specimen: Nasopharyngeal Swab; Respiratory  Result Value Ref Range Status   Adenovirus NOT DETECTED NOT DETECTED Final   Coronavirus 229E NOT DETECTED NOT DETECTED Final    Comment: (NOTE) The Coronavirus on the Respiratory Panel, DOES NOT test for the novel  Coronavirus (2019 nCoV)    Coronavirus HKU1 NOT DETECTED NOT  DETECTED Final   Coronavirus NL63 NOT DETECTED NOT DETECTED Final   Coronavirus OC43 NOT DETECTED NOT DETECTED Final   Metapneumovirus NOT DETECTED NOT DETECTED Final   Rhinovirus / Enterovirus NOT DETECTED NOT DETECTED Final   Influenza A NOT DETECTED NOT DETECTED Final   Influenza B NOT DETECTED NOT DETECTED Final   Parainfluenza Virus 1 NOT DETECTED NOT DETECTED Final   Parainfluenza Virus 2 NOT DETECTED NOT DETECTED Final   Parainfluenza Virus 3 NOT DETECTED NOT DETECTED Final   Parainfluenza Virus 4 NOT DETECTED NOT DETECTED Final   Respiratory Syncytial Virus NOT DETECTED NOT DETECTED Final   Bordetella pertussis NOT DETECTED NOT DETECTED Final   Bordetella Parapertussis NOT DETECTED NOT DETECTED Final   Chlamydophila pneumoniae NOT DETECTED NOT DETECTED Final   Mycoplasma pneumoniae NOT DETECTED NOT DETECTED Final    Comment: Performed at [facility], [address] [place], [place] [address]  Urine culture     Status: None   Collection Time: 05/11/23  3:20 PM   Specimen: Urine, Clean Catch  Result Value Ref Range Status   Specimen Description   Final    URINE, CLEAN CATCH Performed at [facility], [address], [place], [place] [address]    Special Requests   Final    NONE Performed at [facility], [address] [place], [place] [address]    Culture   Final    NO GROWTH Performed at [facility], [address] [place], [place] [address]    Report Status 05/12/2023 FINAL  Final  Calprotectin, Fecal     Status: Abnormal   Collection Time: 05/13/23 11:05 AM   Specimen: Stool  Result Value Ref Range Status   Calprotectin, Fecal 4,740 (H) 0 - 120 ug/g Final    Comment: (NOTE) **Results verified by repeat testing** Concentration     Interpretation   Follow-Up < 5 - 50 ug/g     Normal           None >50 -120 ug/g     Borderline       Re-evaluate in 4-6 weeks    >120 ug/g     Abnormal         Repeat as clinically                                    indicated Performed At: [facility] [address] [place], [place] [id number] Jolene Schimke MD MV:[id number]   C difficile quick screen w  PCR reflex     Status: None   Collection Time: 05/13/23 11:05 AM   Specimen: STOOL  Result Value Ref Range Status   C Diff antigen NEGATIVE NEGATIVE Final   C Diff toxin NEGATIVE NEGATIVE Final   C Diff interpretation No C. difficile detected.  Final    Comment: Performed at [facility], [address] [place], [place] [address]  Gastrointestinal Panel by PCR , Stool     Status: None   Collection Time: 05/13/23 11:05 AM   Specimen: Stool  Result Value Ref Range Status   Campylobacter species NOT DETECTED NOT DETECTED Final   Plesimonas shigelloides NOT DETECTED NOT DETECTED Final   Salmonella species NOT DETECTED NOT DETECTED Final   Yersinia enterocolitica NOT DETECTED NOT DETECTED Final   Vibrio species NOT DETECTED NOT DETECTED Final   Vibrio cholerae NOT DETECTED NOT DETECTED Final   Enteroaggregative E coli (EAEC) NOT DETECTED NOT DETECTED Final   Enteropathogenic E coli (EPEC) NOT DETECTED NOT DETECTED Final   Enterotoxigenic E coli (ETEC) NOT DETECTED NOT DETECTED Final   Shiga like toxin producing E coli (STEC) NOT DETECTED NOT DETECTED Final   Shigella/Enteroinvasive E coli (EIEC) NOT DETECTED NOT DETECTED Final   Cryptosporidium NOT DETECTED NOT DETECTED Final   Cyclospora cayetanensis NOT DETECTED NOT DETECTED Final   Entamoeba histolytica NOT DETECTED NOT DETECTED Final   Giardia lamblia NOT DETECTED NOT DETECTED Final   Adenovirus F40/41 NOT DETECTED NOT DETECTED Final   Astrovirus NOT DETECTED NOT DETECTED Final   Norovirus GI/GII NOT DETECTED NOT DETECTED Final   Rotavirus A NOT DETECTED NOT DETECTED Final   Sapovirus (I, II, IV, and V) NOT DETECTED NOT DETECTED Final    Comment: Performed at [facility], [address] [place], [place] [address]  Blood Culture (routine x 2)      Status: None (Preliminary result)   Collection Time: 05/15/23 10:16 AM   Specimen: BLOOD  Result Value Ref Range Status   Specimen Description BLOOD BLD PORTA CATH  Final   Special Requests   Final    BOTTLES DRAWN AEROBIC AND ANAEROBIC Blood Culture adequate volume   Culture   Final    NO GROWTH 2 DAYS Performed at [facility], [address] [place], [place] [address]    Report Status PENDING  Incomplete  Culture, blood (Routine X 2) w Reflex to ID Panel     Status: None (Preliminary result)   Collection Time: 05/15/23  5:00 PM   Specimen: BLOOD  Result Value Ref Range Status   Specimen Description BLOOD BLOOD RIGHT FOREARM  Final   Special Requests   Final    BOTTLES DRAWN AEROBIC AND ANAEROBIC Blood Culture adequate volume   Culture   Final    NO GROWTH 2 DAYS Performed at [facility], [address] [place], [place] [address]    Report Status PENDING  Incomplete  C Difficile Quick Screen w PCR reflex     Status: None   Collection Time: 05/16/23  1:00 PM   Specimen: STOOL  Result Value Ref Range Status   C Diff antigen NEGATIVE NEGATIVE Final   C Diff toxin NEGATIVE NEGATIVE Final   C Diff interpretation No C. difficile detected.  Final    Comment: Performed at [facility], [address] [place], [place] [address]  Gastrointestinal Panel by PCR , Stool     Status: None   Collection Time: 05/16/23  1:00 PM  Specimen: Stool  Result Value Ref Range Status   Campylobacter species NOT DETECTED NOT DETECTED Final   Plesimonas shigelloides NOT DETECTED NOT DETECTED Final   Salmonella species NOT DETECTED NOT DETECTED Final   Yersinia enterocolitica NOT DETECTED NOT DETECTED Final   Vibrio species NOT DETECTED NOT DETECTED Final   Vibrio cholerae NOT DETECTED NOT DETECTED Final   Enteroaggregative E coli (EAEC) NOT DETECTED NOT DETECTED Final   Enteropathogenic E coli (EPEC) NOT DETECTED NOT DETECTED Final   Enterotoxigenic E coli (ETEC)  NOT DETECTED NOT DETECTED Final   Shiga like toxin producing E coli (STEC) NOT DETECTED NOT DETECTED Final   Shigella/Enteroinvasive E coli (EIEC) NOT DETECTED NOT DETECTED Final   Cryptosporidium NOT DETECTED NOT DETECTED Final   Cyclospora cayetanensis NOT DETECTED NOT DETECTED Final   Entamoeba histolytica NOT DETECTED NOT DETECTED Final   Giardia lamblia NOT DETECTED NOT DETECTED Final   Adenovirus F40/41 NOT DETECTED NOT DETECTED Final   Astrovirus NOT DETECTED NOT DETECTED Final   Norovirus GI/GII NOT DETECTED NOT DETECTED Final   Rotavirus A NOT DETECTED NOT DETECTED Final   Sapovirus (I, II, IV, and V) NOT DETECTED NOT DETECTED Final    Comment: Performed at [facility], [address] [place], [place] [address]         Radiology Studies: CT Angio Chest PE W/Cm &/Or Wo Cm  Result Date: 05/15/2023 CLINICAL DATA:  Suspected pulmonary embolism. EXAM: CT ANGIOGRAPHY CHEST WITH CONTRAST TECHNIQUE: Multidetector CT imaging of the chest was performed using the standard protocol during bolus administration of intravenous contrast. Multiplanar CT image reconstructions and MIPs were obtained to evaluate the vascular anatomy. RADIATION DOSE REDUCTION: This exam was performed according to the departmental dose-optimization program which includes automated exposure control, adjustment of the mA and/or kV according to patient size and/or use of iterative reconstruction technique. CONTRAST:  75mL OMNIPAQUE IOHEXOL 350 MG/ML SOLN COMPARISON:  May 02, 2023 FINDINGS: Cardiovascular: A right-sided venous Port-A-Cath is in place. There is mild calcification of the aortic arch, without evidence of an aortic aneurysm. Satisfactory opacification of the pulmonary arteries to the segmental level. No evidence of pulmonary embolism. Normal heart size. No pericardial effusion. Mediastinum/Nodes: No enlarged mediastinal, hilar, or axillary lymph nodes. Thyroid gland, trachea, and esophagus  demonstrate no significant findings. Lungs/Pleura: Lungs are clear. No pleural effusion or pneumothorax. Upper Abdomen: The left kidney is surgically absent. Musculoskeletal: No chest wall abnormality. No acute or significant osseous findings. Review of the MIP images confirms the above findings. IMPRESSION: 1. No evidence of pulmonary embolism or acute cardiopulmonary disease. 2. Evidence of prior left nephrectomy. 3. Aortic atherosclerosis. Aortic Atherosclerosis (ICD10-I70.0). Electronically Signed   By: Aram Candela M.D.   On: 05/15/2023 18:16        Scheduled Meds:  atorvastatin  20 mg Oral Daily   Chlorhexidine Gluconate Cloth  6 each Topical Daily   feeding supplement  237 mL Oral BID BM   insulin aspart  0-5 Units Subcutaneous QHS   insulin aspart  0-9 Units Subcutaneous TID WC   methylPREDNISolone (SOLU-MEDROL) injection  1 mg/kg Intravenous Daily   metoprolol tartrate  25 mg Oral BID   polyvinyl alcohol  1 drop Both Eyes QID   prednisoLONE acetate  1 drop Both Eyes QID   Continuous Infusions:     LOS: 2 days     Silvano Bilis, MD [facility]   If 7PM-7AM, please contact night-coverage [email or web address] Password [email or web address] 05/17/2023, 1:43 PM
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
## 2023-05-17 NOTE — Progress Notes (Signed)
Initial Nutrition Assessment  DOCUMENTATION CODES:   Non-severe (moderate) malnutrition in context of chronic illness  INTERVENTION:   -MVI with minerals daily -Ensure Enlive po BID, each supplement provides 350 kcal and 20 grams of protein.  -Discussed with MD; diet advanced to soft diet  NUTRITION DIAGNOSIS:   Moderate Malnutrition related to chronic illness (renal cell carcinoma with mets to the bone) as evidenced by mild fat depletion, moderate fat depletion, mild muscle depletion, moderate muscle depletion.  GOAL:   Patient will meet greater than or equal to 90% of their needs  MONITOR:   PO intake, Supplement acceptance, Diet advancement  REASON FOR ASSESSMENT:   Malnutrition Screening Tool    ASSESSMENT:   Pt with medical history significant of renal cell carcinoma status post nephrectomy with metastasis to the bone, HFrEF, bilateral PE, iron deficiency anemia, GERD, hypertension presenting with intractable nausea vomiting.  Pt admitted with nausea, vomiting, and diarrhea.   Reviewed I/O's: +1.2 L x 24 hours and +4.2 L since admission  Spoke with pt at bedside, who was pleasant and in good spirits today. Pt shares improvement for the past 5 hours; he has not had any diarrhea since this morning. Prior to today, he reports 8-10 episodes of diarrhea daily. Additionally, pt was also experiencing nausea, however, this has been resolved today.   Pt reports he generally has a good appetite, consuming 3 meals per day. He has also started drinking Glucerna at home, but is not consistent with this. He has been consuming Ensure supplements well and enjoys these; RD provided another Ensure supplement to pt during visit per his request. Pt eager to try solid foods; RD reached out to MD regarding diet advancement. Per GI, ok for soft diet and plan for colonoscopy on Thursday (05/19/23) if diarrhea does not resolved. Per oncology, suspect side effects of immunotherapy may be cause to  diarrhea. Methylprednisolone added yesterday with improvement of symptoms.   Pt reports his UBW is around 180#, which he last weighed in 07/2022. Pt shares that he has had a progressive 30# wt loss over this time, which he attributes to nausea, vomiting, diarrhea, as well as side effects from cancer treatments. Reviewed wt hx; wt has been stable over the past 3 months.   Discussed importance of good meal and supplement intake to promote healing. Pt amenable to continue Ensure.   Medications reviewed and include solu-medrol.   Lab Results  Component Value Date   HGBA1C 5.7 02/01/2023   PTA DM medications are Xigduo 50-1000 mg daily.   Labs reviewed: Na: 134, K: 3.3, CBGS: 73-154 (inpatient orders for glycemic control are 0-5 units insulin aspart daily at bedtime and 0-9 units insulin aspart TID with meals).    NUTRITION - FOCUSED PHYSICAL EXAM:  Flowsheet Row Most Recent Value  Orbital Region Mild depletion  Upper Arm Region No depletion  Thoracic and Lumbar Region No depletion  Buccal Region Mild depletion  Temple Region Mild depletion  Clavicle Bone Region Mild depletion  Clavicle and Acromion Bone Region Mild depletion  Scapular Bone Region Mild depletion  Dorsal Hand Mild depletion  Patellar Region Moderate depletion  Anterior Thigh Region Moderate depletion  Posterior Calf Region Moderate depletion  Edema (RD Assessment) None  Hair Reviewed  Eyes Reviewed  Mouth Reviewed  Skin Reviewed  Nails Reviewed       Diet Order:   Diet Order             DIET SOFT Room service appropriate? Yes; Fluid  consistency: Thin  Diet effective now                   EDUCATION NEEDS:   Education needs have been addressed  Skin:  Skin Assessment: Reviewed RN Assessment  Last BM:  05/16/23  Height:   Ht Readings from Last 1 Encounters:  05/15/23 5\' 10"  (1.778 m)    Weight:   Wt Readings from Last 1 Encounters:  05/15/23 69.9 kg    Ideal Body Weight:  75.5 kg  BMI:   Body mass index is 22.11 kg/m.  Estimated Nutritional Needs:   Kcal:  2100-2300  Protein:  105-120 grams  Fluid:  > 2 L    Levada Schilling, RD, LDN, CDCES Registered Dietitian III Certified Diabetes Care and Education Specialist Please refer to [facility] for RD and/or RD on-call/weekend/after hours pager
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
## 2023-05-17 NOTE — Plan of Care (Signed)

## 2023-05-18 ENCOUNTER — Encounter: Payer: Self-pay | Admitting: Internal Medicine

## 2023-05-18 ENCOUNTER — Other Ambulatory Visit: Payer: Self-pay | Admitting: Internal Medicine

## 2023-05-18 DIAGNOSIS — R112 Nausea with vomiting, unspecified: Secondary | ICD-10-CM | POA: Diagnosis not present

## 2023-05-18 LAB — CBC WITH DIFFERENTIAL/PLATELET
Abs Immature Granulocytes: 0.56 10*3/uL — ABNORMAL HIGH (ref 0.00–0.07)
Basophils Absolute: 0 10*3/uL (ref 0.0–0.1)
Basophils Relative: 0 %
Eosinophils Absolute: 0 10*3/uL (ref 0.0–0.5)
Eosinophils Relative: 0 %
HCT: 32.8 % — ABNORMAL LOW (ref 39.0–52.0)
Hemoglobin: 11.2 g/dL — ABNORMAL LOW (ref 13.0–17.0)
Immature Granulocytes: 5 %
Lymphocytes Relative: 5 %
Lymphs Abs: 0.5 10*3/uL — ABNORMAL LOW (ref 0.7–4.0)
MCH: 28.4 pg (ref 26.0–34.0)
MCHC: 34.1 g/dL (ref 30.0–36.0)
MCV: 83.2 fL (ref 80.0–100.0)
Monocytes Absolute: 1.7 10*3/uL — ABNORMAL HIGH (ref 0.1–1.0)
Monocytes Relative: 16 %
Neutro Abs: 7.7 10*3/uL (ref 1.7–7.7)
Neutrophils Relative %: 74 %
Platelets: 416 10*3/uL — ABNORMAL HIGH (ref 150–400)
RBC: 3.94 MIL/uL — ABNORMAL LOW (ref 4.22–5.81)
RDW: 14.6 % (ref 11.5–15.5)
Smear Review: NORMAL
WBC: 10.5 10*3/uL (ref 4.0–10.5)
nRBC: 0 % (ref 0.0–0.2)

## 2023-05-18 LAB — BASIC METABOLIC PANEL
Anion gap: 10 (ref 5–15)
BUN: 11 mg/dL (ref 6–20)
CO2: 18 mmol/L — ABNORMAL LOW (ref 22–32)
Calcium: 8.4 mg/dL — ABNORMAL LOW (ref 8.9–10.3)
Chloride: 107 mmol/L (ref 98–111)
Creatinine, Ser: 0.84 mg/dL (ref 0.61–1.24)
GFR, Estimated: >60 mL/min (ref >60)
Glucose, Bld: 121 mg/dL — ABNORMAL HIGH (ref 70–99)
Potassium: 3.2 mmol/L — ABNORMAL LOW (ref 3.5–5.1)
Sodium: 135 mmol/L (ref 135–145)

## 2023-05-18 LAB — GLUCOSE, CAPILLARY
Glucose-Capillary: 107 mg/dL — ABNORMAL HIGH (ref 70–99)
Glucose-Capillary: 139 mg/dL — ABNORMAL HIGH (ref 70–99)
Glucose-Capillary: 149 mg/dL — ABNORMAL HIGH (ref 70–99)
Glucose-Capillary: 125 mg/dL — ABNORMAL HIGH (ref 70–99)

## 2023-05-18 LAB — C-REACTIVE PROTEIN: CRP: 6.6 mg/dL — ABNORMAL HIGH (ref <1.0)

## 2023-05-18 MED ORDER — PEG 3350-KCL-NA BICARB-NACL 420 G PO SOLR
4000.0000 mL | Freq: Once | ORAL | Status: AC
  Administered 2023-05-18: 4000 mL via ORAL
  Filled 2023-05-18: qty 4000

## 2023-05-18 MED ORDER — SODIUM CHLORIDE 0.9 % IV SOLN: INTRAVENOUS | Status: AC

## 2023-05-18 MED ORDER — POTASSIUM CHLORIDE CRYS ER 20 MEQ PO TBCR
40.0000 meq | EXTENDED_RELEASE_TABLET | Freq: Once | ORAL | Status: AC
  Administered 2023-05-18: 40 meq via ORAL
  Filled 2023-05-18: qty 2

## 2023-05-18 MED ORDER — PSYLLIUM 95 % PO PACK
1.0000 | PACK | Freq: Every day | ORAL | Status: DC
  Administered 2023-05-18 – 2023-05-20 (×3): 1 via ORAL
  Filled 2023-05-18 (×4): qty 1

## 2023-05-18 MED ORDER — DIPHENOXYLATE-ATROPINE 2.5-0.025 MG PO TABS
1.0000 | ORAL_TABLET | Freq: Four times a day (QID) | ORAL | Status: DC | PRN
  Administered 2023-05-18 – 2023-05-20 (×4): 1 via ORAL
  Filled 2023-05-18 (×4): qty 1

## 2023-05-18 NOTE — Progress Notes (Signed)
PROGRESS NOTE    Paul Flowers  ZOX:[id number] DOB: Nov 12, 1970 DOA: 05/15/2023 PCP: [name], MD    Brief Narrative:  Paul Flowers is a 52 y.o. male with medical history significant of renal cell carcinoma status post nephrectomy with metastasis to the bone, HFrEF, bilateral PE, iron deficiency anemia, GERD, hypertension presenting with intractable nausea vomiting.  Patient reports recurrent nausea vomiting over the past 1 to 2 weeks.  Noted to be followed by Dr. Michae Kava outpatient for renal cell carcinoma.  Noted to be status post nephrectomy with metastasis to the bone on immunotherapy as well as radiation.  Positive generalized weakness and fatigue.  Was evaluated by Dr. Michae Kava.  Symptoms felt to be related to immunotherapy.  No fevers or chills.  Mild abdominal pain.  Mild nonbloody nonbilious diarrhea.  No focal hemiparesis or confusion.  Baseline pulmonary embolism on Eliquis.  Noted to have recent travel to [place].  Missed some doses of Eliquis.  No chest pain or shortness of breath at present.   Assessment & Plan:   Principal Problem:   Intractable nausea and vomiting Active Problems:   Multiple subsegmental pulmonary emboli without acute cor pulmonale (HCC)   GERD   OSA (obstructive sleep apnea)   Hypertension associated with diabetes (HCC)   Controlled type 2 diabetes mellitus without complication, without long-term current use of insulin (HCC)   Renal cell cancer (HCC)   Left ventricular systolic dysfunction (LVSD)   Conjunctivitis   Drug-induced colitis   Dehydration   High risk medication use   Malnutrition of moderate degree  # Nausea, vomiting, diarrhea # Immunotherapy colitis Recent gi pathogen panel and c diff negative. CT from a few days ago showing enteritis. Oncology thinks probably side effect of immunotherapy.  Diarrhea persistent Plan: Steroids per oncology Antidiarrheals, switch to Lomotil Psyllium for stool bulking effect Hold home  immunotherapy GI planning colonoscopy on 11/14    # Metabolic acidosis 2/2 dehydration. improving -No IV fluids   # Red eye Bilateral, with red flags of photosensitivity and eye pain. Improving with prednisolone. I spoke w/ dr. Inez Pilgrim of ophtho 11/11, he is concerned for iritis given immune suppression and red flag symptoms. He advises holding abx, continuing prednisolone eye drops, and close f/u in his clinic this week, they will see him in the hospital this week if he doesn't discharge in the next day or two Plan: - continue prednisolone eye drops -Restart ophthalmology regarding possibility of an inpatient consultation   # PE Diagnosed in June, has been compliant w/ apixaban. Asymptomatic - apixaban on hold pending possible colonoscopy Thursday   # RCC S/p resection, on immunotherapy as above - holding immunotherapy   # HTN Here bp wnl in setting ov vomiting/diarrhea - home losartan on hold - cont home metoprolol   # HFrEF Appears euvolemic. Ef 40-45 in august   - cont home metoprolol   # T2DM Glucose controlled - SSI   DVT prophylaxis: SCD Code Status: Full Family Communication: None today Disposition Plan: Status is: Inpatient Remains inpatient appropriate because: Persistent diarrhea, suspicion for immune related enteritis.   Level of care: Med-Surg  Consultants:  GI Oncology  Procedures:  None  Antimicrobials: None   Subjective: Seen and examined.  Resting in bed.  Persistent diarrhea reported.  Objective: Vitals:   05/17/23 1605 05/17/23 2006 05/18/23 0407 05/18/23 0749  BP: 107/74 116/77 111/74 104/68  Pulse: 93 97 80 85  Resp: 17 18 18 18   Temp: 98.1 F (36.7 C)  98.2 F (36.8 C) 97.8 F (36.6 C) 97.8 F (36.6 C)  TempSrc:      SpO2: 99% 100% 100% 100%  Weight:      Height:       No intake or output data in the 24 hours ending 05/18/23 1201 Filed Weights   05/15/23 0902  Weight: 69.9 kg    Examination:  General exam:  NAD Respiratory system: Clear to auscultation. Respiratory effort normal. Cardiovascular system: S1-2, RRR, no murmurs, no pedal edema Gastrointestinal system: Thin, soft, NT/ND, normal bowel sounds Central nervous system: Alert and oriented. No focal neurological deficits. Extremities: Symmetric 5 x 5 power. Skin: No rashes, lesions or ulcers Psychiatry: Judgement and insight appear normal. Mood & affect appropriate.     Data Reviewed: I have personally reviewed following labs and imaging studies  CBC: Recent Labs  Lab 05/11/23 1344 05/13/23 0832 05/15/23 1016 05/16/23 0530 05/18/23 1030  WBC 9.4 9.2 9.1 8.1 10.5  NEUTROABS 4.9 4.9 5.5  --  PENDING  HGB 14.1 13.5 13.4 11.3* 11.2*  HCT 40.9 39.1 38.9* 32.4* 32.8*  MCV 83.8 82.8 84.4 82.9 83.2  PLT 245 271 283 296 416*   Basic Metabolic Panel: Recent Labs  Lab 05/13/23 0832 05/15/23 1016 05/16/23 0530 05/17/23 0500 05/18/23 0445  NA 132* 133* 131* 134* 135  K 3.4* 3.7 3.4* 3.3* 3.2*  CL 101 101 105 108 107  CO2 21* 19* 16* 20* 18*  GLUCOSE 107* 74 89 144* 121*  BUN 12 12 10 9 11   CREATININE 1.05 0.95 0.91 0.75 0.84  CALCIUM 8.0* 8.2* 7.6* 7.9* 8.4*  MG 1.8 1.9  --   --   --    GFR: Estimated Creatinine Clearance: 99.4 mL/min (by C-G formula based on SCr of 0.84 mg/dL). Liver Function Tests: Recent Labs  Lab 05/11/23 1344 05/13/23 0832 05/15/23 1016 05/16/23 0530  AST 18 20 22 23   ALT 18 18 22 20   ALKPHOS 46 39 44 37*  BILITOT 1.8* 1.6* 1.7* 1.1  PROT 6.9 6.3* 6.1* 5.4*  ALBUMIN 3.1* 2.9* 2.5* 2.1*   Recent Labs  Lab 05/15/23 1016  LIPASE 35   No results for input(s): "AMMONIA" in the last 168 hours. Coagulation Profile: Recent Labs  Lab 05/15/23 1016  INR 1.6*   Cardiac Enzymes: No results for input(s): "CKTOTAL", "CKMB", "CKMBINDEX", "TROPONINI" in the last 168 hours. BNP (last 3 results) No results for input(s): "PROBNP" in the last 8760 hours. HbA1C: No results for input(s): "HGBA1C" in  the last 72 hours. CBG: Recent Labs  Lab 05/17/23 1112 05/17/23 1650 05/17/23 2012 05/18/23 0759 05/18/23 1129  GLUCAP 144* 132* 121* 107* 139*   Lipid Profile: No results for input(s): "CHOL", "HDL", "LDLCALC", "TRIG", "CHOLHDL", "LDLDIRECT" in the last 72 hours. Thyroid Function Tests: No results for input(s): "TSH", "T4TOTAL", "FREET4", "T3FREE", "THYROIDAB" in the last 72 hours. Anemia Panel: No results for input(s): "VITAMINB12", "FOLATE", "FERRITIN", "TIBC", "IRON", "RETICCTPCT" in the last 72 hours. Sepsis Labs: Recent Labs  Lab 05/15/23 1016  LATICACIDVEN 0.9    Recent Results (from the past 240 hour(s))  Respiratory (~20 pathogens) panel by PCR     Status: None   Collection Time: 05/11/23  3:20 PM   Specimen: Nasopharyngeal Swab; Respiratory  Result Value Ref Range Status   Adenovirus NOT DETECTED NOT DETECTED Final   Coronavirus 229E NOT DETECTED NOT DETECTED Final    Comment: (NOTE) The Coronavirus on the Respiratory Panel, DOES NOT test for the novel  Coronavirus (2019  nCoV)    Coronavirus HKU1 NOT DETECTED NOT DETECTED Final   Coronavirus NL63 NOT DETECTED NOT DETECTED Final   Coronavirus OC43 NOT DETECTED NOT DETECTED Final   Metapneumovirus NOT DETECTED NOT DETECTED Final   Rhinovirus / Enterovirus NOT DETECTED NOT DETECTED Final   Influenza A NOT DETECTED NOT DETECTED Final   Influenza B NOT DETECTED NOT DETECTED Final   Parainfluenza Virus 1 NOT DETECTED NOT DETECTED Final   Parainfluenza Virus 2 NOT DETECTED NOT DETECTED Final   Parainfluenza Virus 3 NOT DETECTED NOT DETECTED Final   Parainfluenza Virus 4 NOT DETECTED NOT DETECTED Final   Respiratory Syncytial Virus NOT DETECTED NOT DETECTED Final   Bordetella pertussis NOT DETECTED NOT DETECTED Final   Bordetella Parapertussis NOT DETECTED NOT DETECTED Final   Chlamydophila pneumoniae NOT DETECTED NOT DETECTED Final   Mycoplasma pneumoniae NOT DETECTED NOT DETECTED Final    Comment: Performed at  [facility], [address] [place], [place] [address]  Urine culture     Status: None   Collection Time: 05/11/23  3:20 PM   Specimen: Urine, Clean Catch  Result Value Ref Range Status   Specimen Description   Final    URINE, CLEAN CATCH Performed at [facility], [address] [place], [place] [address]    Special Requests   Final    NONE Performed at [facility], [address], [place], [place] [address]    Culture   Final    NO GROWTH Performed at [facility], [address], [place], [place] [address]    Report Status 05/12/2023 FINAL  Final  Calprotectin, Fecal     Status: Abnormal   Collection Time: 05/13/23 11:05 AM   Specimen: Stool  Result Value Ref Range Status   Calprotectin, Fecal 4,740 (H) 0 - 120 ug/g Final    Comment: (NOTE) **Results verified by repeat testing** Concentration     Interpretation   Follow-Up < 5 - 50 ug/g     Normal           None >50 -120 ug/g     Borderline       Re-evaluate in 4-6 weeks    >120 ug/g     Abnormal         Repeat as clinically                                   indicated Performed At: [facility] [address] [place], [place] [id number] Jolene Schimke MD MV:[id number]   C difficile quick screen w PCR reflex     Status: None   Collection Time: 05/13/23 11:05 AM   Specimen: STOOL  Result Value Ref Range Status   C Diff antigen NEGATIVE NEGATIVE Final   C Diff toxin NEGATIVE NEGATIVE Final   C Diff interpretation No C. difficile detected.  Final    Comment: Performed at [facility], [address] [place], [place] [address]  Gastrointestinal Panel by PCR , Stool     Status: None   Collection Time: 05/13/23 11:05 AM   Specimen: Stool  Result Value Ref Range Status   Campylobacter species NOT DETECTED NOT DETECTED Final   Plesimonas shigelloides NOT DETECTED NOT DETECTED Final   Salmonella species NOT DETECTED NOT DETECTED Final   Yersinia enterocolitica NOT DETECTED NOT  DETECTED Final   Vibrio species NOT DETECTED NOT DETECTED Final  Vibrio cholerae NOT DETECTED NOT DETECTED Final   Enteroaggregative E coli (EAEC) NOT DETECTED NOT DETECTED Final   Enteropathogenic E coli (EPEC) NOT DETECTED NOT DETECTED Final   Enterotoxigenic E coli (ETEC) NOT DETECTED NOT DETECTED Final   Shiga like toxin producing E coli (STEC) NOT DETECTED NOT DETECTED Final   Shigella/Enteroinvasive E coli (EIEC) NOT DETECTED NOT DETECTED Final   Cryptosporidium NOT DETECTED NOT DETECTED Final   Cyclospora cayetanensis NOT DETECTED NOT DETECTED Final   Entamoeba histolytica NOT DETECTED NOT DETECTED Final   Giardia lamblia NOT DETECTED NOT DETECTED Final   Adenovirus F40/41 NOT DETECTED NOT DETECTED Final   Astrovirus NOT DETECTED NOT DETECTED Final   Norovirus GI/GII NOT DETECTED NOT DETECTED Final   Rotavirus A NOT DETECTED NOT DETECTED Final   Sapovirus (I, II, IV, and V) NOT DETECTED NOT DETECTED Final    Comment: Performed at [facility], [address] [place], [place] [address]  Blood Culture (routine x 2)     Status: None (Preliminary result)   Collection Time: 05/15/23 10:16 AM   Specimen: BLOOD  Result Value Ref Range Status   Specimen Description BLOOD BLD PORTA CATH  Final   Special Requests   Final    BOTTLES DRAWN AEROBIC AND ANAEROBIC Blood Culture adequate volume   Culture   Final    NO GROWTH 3 DAYS Performed at [facility], [address] [place], [place] [address]    Report Status PENDING  Incomplete  Culture, blood (Routine X 2) w Reflex to ID Panel     Status: None (Preliminary result)   Collection Time: 05/15/23  5:00 PM   Specimen: BLOOD  Result Value Ref Range Status   Specimen Description BLOOD BLOOD RIGHT FOREARM  Final   Special Requests   Final    BOTTLES DRAWN AEROBIC AND ANAEROBIC Blood Culture adequate volume   Culture   Final    NO GROWTH 3 DAYS Performed at [facility], [address] [place],  [place] [address]    Report Status PENDING  Incomplete  C Difficile Quick Screen w PCR reflex     Status: None   Collection Time: 05/16/23  1:00 PM   Specimen: STOOL  Result Value Ref Range Status   C Diff antigen NEGATIVE NEGATIVE Final   C Diff toxin NEGATIVE NEGATIVE Final   C Diff interpretation No C. difficile detected.  Final    Comment: Performed at [facility], [address] [place], [place] [address]  Gastrointestinal Panel by PCR , Stool     Status: None   Collection Time: 05/16/23  1:00 PM   Specimen: Stool  Result Value Ref Range Status   Campylobacter species NOT DETECTED NOT DETECTED Final   Plesimonas shigelloides NOT DETECTED NOT DETECTED Final   Salmonella species NOT DETECTED NOT DETECTED Final   Yersinia enterocolitica NOT DETECTED NOT DETECTED Final   Vibrio species NOT DETECTED NOT DETECTED Final   Vibrio cholerae NOT DETECTED NOT DETECTED Final   Enteroaggregative E coli (EAEC) NOT DETECTED NOT DETECTED Final   Enteropathogenic E coli (EPEC) NOT DETECTED NOT DETECTED Final   Enterotoxigenic E coli (ETEC) NOT DETECTED NOT DETECTED Final   Shiga like toxin producing E coli (STEC) NOT DETECTED NOT DETECTED Final   Shigella/Enteroinvasive E coli (EIEC) NOT DETECTED NOT DETECTED Final   Cryptosporidium NOT DETECTED NOT DETECTED Final   Cyclospora cayetanensis NOT DETECTED NOT DETECTED Final   Entamoeba histolytica NOT DETECTED NOT DETECTED  Final   Giardia lamblia NOT DETECTED NOT DETECTED Final   Adenovirus F40/41 NOT DETECTED NOT DETECTED Final   Astrovirus NOT DETECTED NOT DETECTED Final   Norovirus GI/GII NOT DETECTED NOT DETECTED Final   Rotavirus A NOT DETECTED NOT DETECTED Final   Sapovirus (I, II, IV, and V) NOT DETECTED NOT DETECTED Final    Comment: Performed at [facility], [address], [place], [place] [address]         Radiology Studies: No results found.      Scheduled Meds:  atorvastatin  20 mg Oral Daily    Chlorhexidine Gluconate Cloth  6 each Topical Daily   feeding supplement  237 mL Oral BID BM   insulin aspart  0-5 Units Subcutaneous QHS   insulin aspart  0-9 Units Subcutaneous TID WC   metoprolol tartrate  25 mg Oral BID   polyvinyl alcohol  1 drop Both Eyes QID   prednisoLONE acetate  1 drop Both Eyes QID   psyllium  1 packet Oral Daily   Continuous Infusions:   LOS: 3 days   Tresa Moore, MD [facility]   If 7PM-7AM, please contact night-coverage  05/18/2023, 12:01 PM

## 2023-05-18 NOTE — Plan of Care (Signed)
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
## 2023-05-18 NOTE — Progress Notes (Signed)
[name] , MD 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 day 1 of follow up   Subjective: Had over 11 bowel movements over night    Objective: Vital signs in last 24 hours: Vitals:   05/17/23 1605 05/17/23 2006 05/18/23 0407 05/18/23 0749  BP: 107/74 116/77 111/74 104/68  Pulse: 93 97 80 85  Resp: 17 18 18 18   Temp: 98.1 F (36.7 C) 98.2 F (36.8 C) 97.8 F (36.6 C) 97.8 F (36.6 C)  TempSrc:      SpO2: 99% 100% 100% 100%  Weight:      Height:       Weight change:  No intake or output data in the 24 hours ending 05/18/23 1120   Exam:  Abdomen: soft, nontender, normal bowel sounds   Lab Results: @LABTEST2 @ Micro Results: Recent Results (from the past 240 hour(s))  Respiratory (~20 pathogens) panel by PCR     Status: None   Collection Time: 05/11/23  3:20 PM   Specimen: Nasopharyngeal Swab; Respiratory  Result Value Ref Range Status   Adenovirus NOT DETECTED NOT DETECTED Final   Coronavirus 229E NOT DETECTED NOT DETECTED Final    Comment: (NOTE) The Coronavirus on the Respiratory Panel, DOES NOT test for the novel  Coronavirus (2019 nCoV)    Coronavirus HKU1 NOT DETECTED NOT DETECTED Final   Coronavirus NL63 NOT DETECTED NOT DETECTED Final   Coronavirus OC43 NOT DETECTED NOT DETECTED Final   Metapneumovirus NOT DETECTED NOT DETECTED Final   Rhinovirus / Enterovirus NOT DETECTED NOT DETECTED Final   Influenza A NOT DETECTED NOT DETECTED Final   Influenza B NOT DETECTED NOT DETECTED Final   Parainfluenza Virus 1 NOT DETECTED NOT DETECTED Final   Parainfluenza Virus 2 NOT DETECTED NOT DETECTED Final   Parainfluenza Virus 3 NOT DETECTED NOT DETECTED Final   Parainfluenza Virus 4 NOT DETECTED NOT DETECTED Final   Respiratory Syncytial Virus NOT DETECTED NOT DETECTED Final    Bordetella pertussis NOT DETECTED NOT DETECTED Final   Bordetella Parapertussis NOT DETECTED NOT DETECTED Final   Chlamydophila pneumoniae NOT DETECTED NOT DETECTED Final   Mycoplasma pneumoniae NOT DETECTED NOT DETECTED Final    Comment: Performed at [facility], [address] [place], [place] [address]  Urine culture     Status: None   Collection Time: 05/11/23  3:20 PM   Specimen: Urine, Clean Catch  Result Value Ref Range Status   Specimen Description   Final    URINE, CLEAN CATCH Performed at [facility], [address] [place], [place] [address]    Special Requests   Final    NONE Performed at [facility], [address], [place], [place] [address]    Culture   Final    NO GROWTH Performed at [facility], [address] [place], [place] [address]    Report Status 05/12/2023 FINAL  Final  Calprotectin, Fecal     Status: Abnormal   Collection Time: 05/13/23 11:05 AM   Specimen: Stool  Result Value Ref Range Status   Calprotectin, Fecal 4,740 (H) 0 - 120 ug/g Final    Comment: (NOTE) **Results verified by repeat testing** Concentration     Interpretation   Follow-Up < 5 - 50 ug/g     Normal  None >50 -120 ug/g     Borderline       Re-evaluate in 4-6 weeks    >120 ug/g     Abnormal         Repeat as clinically                                   indicated Performed At: [facility] [address] [place], [place] [id number] Jolene Schimke MD [id number]   C difficile quick screen w PCR reflex     Status: None   Collection Time: 05/13/23 11:05 AM   Specimen: STOOL  Result Value Ref Range Status   C Diff antigen NEGATIVE NEGATIVE Final   C Diff toxin NEGATIVE NEGATIVE Final   C Diff interpretation No C. difficile detected.  Final    Comment: Performed at [facility], [address] [place], [place] [address]  Gastrointestinal Panel by PCR , Stool     Status: None   Collection Time: 05/13/23 11:05 AM    Specimen: Stool  Result Value Ref Range Status   Campylobacter species NOT DETECTED NOT DETECTED Final   Plesimonas shigelloides NOT DETECTED NOT DETECTED Final   Salmonella species NOT DETECTED NOT DETECTED Final   Yersinia enterocolitica NOT DETECTED NOT DETECTED Final   Vibrio species NOT DETECTED NOT DETECTED Final   Vibrio cholerae NOT DETECTED NOT DETECTED Final   Enteroaggregative E coli (EAEC) NOT DETECTED NOT DETECTED Final   Enteropathogenic E coli (EPEC) NOT DETECTED NOT DETECTED Final   Enterotoxigenic E coli (ETEC) NOT DETECTED NOT DETECTED Final   Shiga like toxin producing E coli (STEC) NOT DETECTED NOT DETECTED Final   Shigella/Enteroinvasive E coli (EIEC) NOT DETECTED NOT DETECTED Final   Cryptosporidium NOT DETECTED NOT DETECTED Final   Cyclospora cayetanensis NOT DETECTED NOT DETECTED Final   Entamoeba histolytica NOT DETECTED NOT DETECTED Final   Giardia lamblia NOT DETECTED NOT DETECTED Final   Adenovirus F40/41 NOT DETECTED NOT DETECTED Final   Astrovirus NOT DETECTED NOT DETECTED Final   Norovirus GI/GII NOT DETECTED NOT DETECTED Final   Rotavirus A NOT DETECTED NOT DETECTED Final   Sapovirus (I, II, IV, and V) NOT DETECTED NOT DETECTED Final    Comment: Performed at [facility], [address] [place], [place] [address]  Blood Culture (routine x 2)     Status: None (Preliminary result)   Collection Time: 05/15/23 10:16 AM   Specimen: BLOOD  Result Value Ref Range Status   Specimen Description BLOOD BLD PORTA CATH  Final   Special Requests   Final    BOTTLES DRAWN AEROBIC AND ANAEROBIC Blood Culture adequate volume   Culture   Final    NO GROWTH 3 DAYS Performed at [facility], [address] [place], [place] [address]    Report Status PENDING  Incomplete  Culture, blood (Routine X 2) w Reflex to ID Panel     Status: None (Preliminary result)   Collection Time: 05/15/23  5:00 PM   Specimen: BLOOD  Result Value Ref Range Status    Specimen Description BLOOD BLOOD RIGHT FOREARM  Final   Special Requests   Final    BOTTLES DRAWN AEROBIC AND ANAEROBIC Blood Culture adequate volume   Culture   Final    NO GROWTH 3 DAYS Performed at [facility], [address] [place], [place] [address]    Report Status PENDING  Incomplete  C Difficile Quick Screen w PCR reflex     Status: None   Collection Time: 05/16/23  1:00 PM   Specimen: STOOL  Result Value Ref Range Status   C Diff antigen NEGATIVE NEGATIVE Final   C Diff toxin NEGATIVE NEGATIVE Final   C Diff interpretation No C. difficile detected.  Final    Comment: Performed at [facility], [address] [place], [place] [address]  Gastrointestinal Panel by PCR , Stool     Status: None   Collection Time: 05/16/23  1:00 PM   Specimen: Stool  Result Value Ref Range Status   Campylobacter species NOT DETECTED NOT DETECTED Final   Plesimonas shigelloides NOT DETECTED NOT DETECTED Final   Salmonella species NOT DETECTED NOT DETECTED Final   Yersinia enterocolitica NOT DETECTED NOT DETECTED Final   Vibrio species NOT DETECTED NOT DETECTED Final   Vibrio cholerae NOT DETECTED NOT DETECTED Final   Enteroaggregative E coli (EAEC) NOT DETECTED NOT DETECTED Final   Enteropathogenic E coli (EPEC) NOT DETECTED NOT DETECTED Final   Enterotoxigenic E coli (ETEC) NOT DETECTED NOT DETECTED Final   Shiga like toxin producing E coli (STEC) NOT DETECTED NOT DETECTED Final   Shigella/Enteroinvasive E coli (EIEC) NOT DETECTED NOT DETECTED Final   Cryptosporidium NOT DETECTED NOT DETECTED Final   Cyclospora cayetanensis NOT DETECTED NOT DETECTED Final   Entamoeba histolytica NOT DETECTED NOT DETECTED Final   Giardia lamblia NOT DETECTED NOT DETECTED Final   Adenovirus F40/41 NOT DETECTED NOT DETECTED Final   Astrovirus NOT DETECTED NOT DETECTED Final   Norovirus GI/GII NOT DETECTED NOT DETECTED Final   Rotavirus A NOT DETECTED NOT DETECTED Final   Sapovirus (I,  II, IV, and V) NOT DETECTED NOT DETECTED Final    Comment: Performed at [facility], [address] [place], [place] [address]   Studies/Results: No results found. Medications: I have reviewed the patient's current medications. Scheduled Meds:  atorvastatin  20 mg Oral Daily   Chlorhexidine Gluconate Cloth  6 each Topical Daily   feeding supplement  237 mL Oral BID BM   insulin aspart  0-5 Units Subcutaneous QHS   insulin aspart  0-9 Units Subcutaneous TID WC   metoprolol tartrate  25 mg Oral BID   polyvinyl alcohol  1 drop Both Eyes QID   prednisoLONE acetate  1 drop Both Eyes QID   Continuous Infusions: PRN Meds:.alum & mag hydroxide-simeth, HYDROcodone-acetaminophen, loperamide, ondansetron **OR** ondansetron (ZOFRAN) IV   Assessment: Principal Problem:   Intractable nausea and vomiting Active Problems:   GERD   OSA (obstructive sleep apnea)   Hypertension associated with diabetes (HCC)   Controlled type 2 diabetes mellitus without complication, without long-term current use of insulin (HCC)   Multiple subsegmental pulmonary emboli without acute cor pulmonale (HCC)   Renal cell cancer (HCC)   Left ventricular systolic dysfunction (LVSD)   Conjunctivitis   Drug-induced colitis   Dehydration   High risk medication use   Malnutrition of moderate degree  Paul Flowers is a 52 y.o. 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  Commenced on methylprednisone with no improvement of the diarrhea.  No evidence of infection with GI PCR and C. difficile.  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  CRP is elevated.  Hepatitis B core total antibody negative TB QuantiFERON in  process hepatitis C antibody nonreactive hepatitis B surface antigen negative   Plan 1. Colonoscopy  with biopsies to confirm  and determine severity of immunotherapy related colitis will be planned for tomorrow since has had over 11 bowel movements overnight . If he  has immunenotherapy mediated colitis then we will plan to start him on Entyvio subsequently.  2.  Follow-up results for TB QuantiFERON which needs to be negative before we start on Entyvio.  I will also check his hepatitis B core total antibody to check for prior hepatitis B infection.  I have discussed alternative options, risks & benefits,  which include, but are not limited to, bleeding, infection, perforation,respiratory complication & drug reaction.  The patient agrees with this plan & written consent will be obtained.     LOS: 3 days   [name], MD 05/18/2023, 11:20 AM

## 2023-05-19 ENCOUNTER — Inpatient Hospital Stay: Payer: Federal, State, Local not specified - PPO | Admitting: Anesthesiology

## 2023-05-19 ENCOUNTER — Inpatient Hospital Stay: Payer: Federal, State, Local not specified - PPO | Admitting: Internal Medicine

## 2023-05-19 ENCOUNTER — Inpatient Hospital Stay: Payer: Federal, State, Local not specified - PPO

## 2023-05-19 ENCOUNTER — Encounter: Payer: Self-pay | Admitting: Family Medicine

## 2023-05-19 ENCOUNTER — Encounter
Admission: EM | Disposition: A | Discharge status: Home / Self Care | Payer: Self-pay | Source: Home / Self Care | Attending: Internal Medicine

## 2023-05-19 DIAGNOSIS — I10 Essential (primary) hypertension: Secondary | ICD-10-CM | POA: Diagnosis not present

## 2023-05-19 DIAGNOSIS — K529 Noninfective gastroenteritis and colitis, unspecified: Secondary | ICD-10-CM | POA: Diagnosis not present

## 2023-05-19 DIAGNOSIS — K5150 Left sided colitis without complications: Secondary | ICD-10-CM | POA: Diagnosis not present

## 2023-05-19 DIAGNOSIS — R112 Nausea with vomiting, unspecified: Secondary | ICD-10-CM | POA: Diagnosis not present

## 2023-05-19 HISTORY — PX: COLONOSCOPY WITH PROPOFOL: SHX5780

## 2023-05-19 HISTORY — PX: BIOPSY: SHX5522

## 2023-05-19 LAB — BASIC METABOLIC PANEL
Anion gap: 7 (ref 5–15)
BUN: 14 mg/dL (ref 6–20)
CO2: 20 mmol/L — ABNORMAL LOW (ref 22–32)
Calcium: 8.9 mg/dL (ref 8.9–10.3)
Chloride: 109 mmol/L (ref 98–111)
Creatinine, Ser: 0.98 mg/dL (ref 0.61–1.24)
GFR, Estimated: >60 mL/min (ref >60)
Glucose, Bld: 107 mg/dL — ABNORMAL HIGH (ref 70–99)
Potassium: 3.3 mmol/L — ABNORMAL LOW (ref 3.5–5.1)
Sodium: 136 mmol/L (ref 135–145)

## 2023-05-19 LAB — GLUCOSE, CAPILLARY
Glucose-Capillary: 79 mg/dL (ref 70–99)
Glucose-Capillary: 73 mg/dL (ref 70–99)
Glucose-Capillary: 69 mg/dL — ABNORMAL LOW (ref 70–99)
Glucose-Capillary: 79 mg/dL (ref 70–99)
Glucose-Capillary: 158 mg/dL — ABNORMAL HIGH (ref 70–99)
Glucose-Capillary: 181 mg/dL — ABNORMAL HIGH (ref 70–99)

## 2023-05-19 SURGERY — COLONOSCOPY WITH PROPOFOL
Anesthesia: General

## 2023-05-19 MED ORDER — SODIUM CHLORIDE 0.9 % IV SOLN: INTRAVENOUS | Status: DC

## 2023-05-19 MED ORDER — LIDOCAINE HCL (PF) 2 % IJ SOLN
INTRAMUSCULAR | Status: DC | PRN
  Administered 2023-05-19: 40 mg via INTRADERMAL

## 2023-05-19 MED ORDER — MAGIC MOUTHWASH
10.0000 mL | Freq: Three times a day (TID) | ORAL | Status: DC | PRN
  Administered 2023-05-20: 10 mL via ORAL
  Filled 2023-05-19 (×2): qty 10

## 2023-05-19 MED ORDER — POTASSIUM CHLORIDE CRYS ER 20 MEQ PO TBCR
40.0000 meq | EXTENDED_RELEASE_TABLET | Freq: Once | ORAL | Status: AC
  Administered 2023-05-19: 40 meq via ORAL
  Filled 2023-05-19: qty 2

## 2023-05-19 MED ORDER — METHYLPREDNISOLONE SODIUM SUCC 125 MG IJ SOLR
1.0000 mg/kg | Freq: Every day | INTRAMUSCULAR | Status: DC
Start: 2023-05-19 — End: 2023-05-20
  Administered 2023-05-19 – 2023-05-20 (×2): 70 mg via INTRAVENOUS
  Filled 2023-05-19: qty 2
  Filled 2023-05-19: qty 1.12
  Filled 2023-05-19: qty 2
  Filled 2023-05-19: qty 1.12

## 2023-05-19 MED ORDER — BIOTENE DRY MOUTH MT LIQD: 15.0000 mL | OROMUCOSAL | Status: DC | PRN

## 2023-05-19 MED ORDER — PROPOFOL 10 MG/ML IV BOLUS
INTRAVENOUS | Status: DC | PRN
  Administered 2023-05-19: 20 mg via INTRAVENOUS
  Administered 2023-05-19: 40 mg via INTRAVENOUS
  Administered 2023-05-19: 50 mg via INTRAVENOUS
  Administered 2023-05-19: 30 mg via INTRAVENOUS
  Administered 2023-05-19: 20 mg via INTRAVENOUS

## 2023-05-19 NOTE — Anesthesia Postprocedure Evaluation (Signed)
Anesthesia Post Note  Patient: Paul Flowers  Procedure(s) Performed: COLONOSCOPY WITH PROPOFOL BIOPSY  Patient location during evaluation: PACU Anesthesia Type: General Level of consciousness: awake Pain management: satisfactory to patient Vital Signs Assessment: post-procedure vital signs reviewed and stable Respiratory status: spontaneous breathing Cardiovascular status: stable Anesthetic complications: no   No notable events documented.   Last Vitals:  Vitals:   05/19/23 1150 05/19/23 1200  BP: 123/72 123/79  Pulse: 98 92  Resp: 10 16  Temp:    SpO2: 100% 100%    Last Pain:  Vitals:   05/19/23 1200  TempSrc:   PainSc: 0-No pain                 VAN STAVEREN,Race Latour

## 2023-05-19 NOTE — Progress Notes (Signed)
PROGRESS NOTE    LEGEND KOLODZIEJ  QIO:[id number] DOB: Nov 24, 1970 DOA: 05/15/2023 PCP: [name], MD    Brief Narrative:  Paul Flowers is a 52 y.o. male with medical history significant of renal cell carcinoma status post nephrectomy with metastasis to the bone, HFrEF, bilateral PE, iron deficiency anemia, GERD, hypertension presenting with intractable nausea vomiting.  Patient reports recurrent nausea vomiting over the past 1 to 2 weeks.  Noted to be followed by Dr. Michae Kava outpatient for renal cell carcinoma.  Noted to be status post nephrectomy with metastasis to the bone on immunotherapy as well as radiation.  Positive generalized weakness and fatigue.  Was evaluated by Dr. Michae Kava.  Symptoms felt to be related to immunotherapy.  No fevers or chills.  Mild abdominal pain.  Mild nonbloody nonbilious diarrhea.  No focal hemiparesis or confusion.  Baseline pulmonary embolism on Eliquis.  Noted to have recent travel to [place].  Missed some doses of Eliquis.  No chest pain or shortness of breath at present.   Assessment & Plan:   Principal Problem:   Intractable nausea and vomiting Active Problems:   Multiple subsegmental pulmonary emboli without acute cor pulmonale (HCC)   GERD   OSA (obstructive sleep apnea)   Hypertension associated with diabetes (HCC)   Controlled type 2 diabetes mellitus without complication, without long-term current use of insulin (HCC)   Renal cell cancer (HCC)   Left ventricular systolic dysfunction (LVSD)   Conjunctivitis   Drug-induced colitis   Dehydration   High risk medication use   Malnutrition of moderate degree  # Nausea, vomiting, diarrhea # Immunotherapy colitis Recent gi pathogen panel and c diff negative. CT from a few days ago showing enteritis. Oncology thinks probably side effect of immunotherapy.  Diarrhea persistent Plan: Steroids per oncology Continue Lomotil Continue psyllium for stool bulking effect Hold home  immunotherapy N.p.o. for colonoscopy today    # Metabolic acidosis 2/2 dehydration. improving -No IV fluids   # Red eye Bilateral, with red flags of photosensitivity and eye pain. Improving with prednisolone. I spoke w/ dr. Inez Pilgrim of ophtho 11/11, he is concerned for iritis given immune suppression and red flag symptoms. He advises holding abx, continuing prednisolone eye drops, and close f/u in his clinic this week, they will see him in the hospital this week if he doesn't discharge in the next day or two Plan: - continue prednisolone eye drops -Appreciate ophthalmology consultation   # PE Diagnosed in June, has been compliant w/ apixaban. Asymptomatic - apixaban on hold pending possible colonoscopy Thursday   # RCC S/p resection, on immunotherapy as above - holding immunotherapy   # HTN Here bp wnl in setting ov vomiting/diarrhea - home losartan on hold - cont home metoprolol   # HFrEF Appears euvolemic. Ef 40-45 in august   - cont home metoprolol   # T2DM Glucose controlled - SSI   DVT prophylaxis: SCD Code Status: Full Family Communication: None today Disposition Plan: Status is: Inpatient Remains inpatient appropriate because: Persistent diarrhea, suspicion for immune related enteritis.   Level of care: Med-Surg  Consultants:  GI Oncology  Procedures:  None  Antimicrobials: None   Subjective: Seen and examined.  Resting comfortably in bed.  No visible distress.  Reports improvement in diarrhea frequency  Objective: Vitals:   05/19/23 0504 05/19/23 0815 05/19/23 0932 05/19/23 1026  BP: 106/75 103/72  106/74  Pulse: 60 91  96  Resp: 20  14 16   Temp: 97.9 F (36.6 C)  98.1 F (36.7 C)  (!) 96.2 F (35.7 C)  TempSrc:  Oral  Temporal  SpO2: 100% 100%  100%  Weight:      Height:       No intake or output data in the 24 hours ending 05/19/23 1045 Filed Weights   05/15/23 0902  Weight: 69.9 kg    Examination:  General exam: No acute  distress.  Appears fatigued Respiratory system: Lungs clear.  Normal work of breathing.  Room air Cardiovascular system: S1-2, RRR, no murmurs, no pedal edema Gastrointestinal system: Thin, soft, NT/ND, normal bowel sounds Central nervous system: Alert and oriented. No focal neurological deficits. Extremities: Symmetric 5 x 5 power. Skin: No rashes, lesions or ulcers Psychiatry: Judgement and insight appear normal. Mood & affect appropriate.     Data Reviewed: I have personally reviewed following labs and imaging studies  CBC: Recent Labs  Lab 05/13/23 0832 05/15/23 1016 05/16/23 0530 05/18/23 1030  WBC 9.2 9.1 8.1 10.5  NEUTROABS 4.9 5.5  --  7.7  HGB 13.5 13.4 11.3* 11.2*  HCT 39.1 38.9* 32.4* 32.8*  MCV 82.8 84.4 82.9 83.2  PLT 271 283 296 416*   Basic Metabolic Panel: Recent Labs  Lab 05/13/23 0832 05/15/23 1016 05/16/23 0530 05/17/23 0500 05/18/23 0445 05/19/23 0454  NA 132* 133* 131* 134* 135 136  K 3.4* 3.7 3.4* 3.3* 3.2* 3.3*  CL 101 101 105 108 107 109  CO2 21* 19* 16* 20* 18* 20*  GLUCOSE 107* 74 89 144* 121* 107*  BUN 12 12 10 9 11 14   CREATININE 1.05 0.95 0.91 0.75 0.84 0.98  CALCIUM 8.0* 8.2* 7.6* 7.9* 8.4* 8.9  MG 1.8 1.9  --   --   --   --    GFR: Estimated Creatinine Clearance: 85.2 mL/min (by C-G formula based on SCr of 0.98 mg/dL). Liver Function Tests: Recent Labs  Lab 05/13/23 0832 05/15/23 1016 05/16/23 0530  AST 20 22 23   ALT 18 22 20   ALKPHOS 39 44 37*  BILITOT 1.6* 1.7* 1.1  PROT 6.3* 6.1* 5.4*  ALBUMIN 2.9* 2.5* 2.1*   Recent Labs  Lab 05/15/23 1016  LIPASE 35   No results for input(s): "AMMONIA" in the last 168 hours. Coagulation Profile: Recent Labs  Lab 05/15/23 1016  INR 1.6*   Cardiac Enzymes: No results for input(s): "CKTOTAL", "CKMB", "CKMBINDEX", "TROPONINI" in the last 168 hours. BNP (last 3 results) No results for input(s): "PROBNP" in the last 8760 hours. HbA1C: No results for input(s): "HGBA1C" in the  last 72 hours. CBG: Recent Labs  Lab 05/18/23 1129 05/18/23 1646 05/18/23 1937 05/19/23 0844 05/19/23 1041  GLUCAP 139* 149* 125* 79 73   Lipid Profile: No results for input(s): "CHOL", "HDL", "LDLCALC", "TRIG", "CHOLHDL", "LDLDIRECT" in the last 72 hours. Thyroid Function Tests: No results for input(s): "TSH", "T4TOTAL", "FREET4", "T3FREE", "THYROIDAB" in the last 72 hours. Anemia Panel: No results for input(s): "VITAMINB12", "FOLATE", "FERRITIN", "TIBC", "IRON", "RETICCTPCT" in the last 72 hours. Sepsis Labs: Recent Labs  Lab 05/15/23 1016  LATICACIDVEN 0.9    Recent Results (from the past 240 hour(s))  Respiratory (~20 pathogens) panel by PCR     Status: None   Collection Time: 05/11/23  3:20 PM   Specimen: Nasopharyngeal Swab; Respiratory  Result Value Ref Range Status   Adenovirus NOT DETECTED NOT DETECTED Final   Coronavirus 229E NOT DETECTED NOT DETECTED Final    Comment: (NOTE) The Coronavirus on the Respiratory Panel, DOES NOT test for the  novel  Coronavirus (2019 nCoV)    Coronavirus HKU1 NOT DETECTED NOT DETECTED Final   Coronavirus NL63 NOT DETECTED NOT DETECTED Final   Coronavirus OC43 NOT DETECTED NOT DETECTED Final   Metapneumovirus NOT DETECTED NOT DETECTED Final   Rhinovirus / Enterovirus NOT DETECTED NOT DETECTED Final   Influenza A NOT DETECTED NOT DETECTED Final   Influenza B NOT DETECTED NOT DETECTED Final   Parainfluenza Virus 1 NOT DETECTED NOT DETECTED Final   Parainfluenza Virus 2 NOT DETECTED NOT DETECTED Final   Parainfluenza Virus 3 NOT DETECTED NOT DETECTED Final   Parainfluenza Virus 4 NOT DETECTED NOT DETECTED Final   Respiratory Syncytial Virus NOT DETECTED NOT DETECTED Final   Bordetella pertussis NOT DETECTED NOT DETECTED Final   Bordetella Parapertussis NOT DETECTED NOT DETECTED Final   Chlamydophila pneumoniae NOT DETECTED NOT DETECTED Final   Mycoplasma pneumoniae NOT DETECTED NOT DETECTED Final    Comment: Performed at [facility], [address], [place], [place] [address]  Urine culture     Status: None   Collection Time: 05/11/23  3:20 PM   Specimen: Urine, Clean Catch  Result Value Ref Range Status   Specimen Description   Final    URINE, CLEAN CATCH Performed at [facility], [address] [place], [place] [address]    Special Requests   Final    NONE Performed at [facility], [address], [place], [place] [address]    Culture   Final    NO GROWTH Performed at [facility], [address], [place], [place] [address]    Report Status 05/12/2023 FINAL  Final  Calprotectin, Fecal     Status: Abnormal   Collection Time: 05/13/23 11:05 AM   Specimen: Stool  Result Value Ref Range Status   Calprotectin, Fecal 4,740 (H) 0 - 120 ug/g Final    Comment: (NOTE) **Results verified by repeat testing** Concentration     Interpretation   Follow-Up < 5 - 50 ug/g     Normal           None >50 -120 ug/g     Borderline       Re-evaluate in 4-6 weeks    >120 ug/g     Abnormal         Repeat as clinically                                   indicated Performed At: [facility] [address] [place], [place] [id number] Jolene Schimke MD HC:[id number]   C difficile quick screen w PCR reflex     Status: None   Collection Time: 05/13/23 11:05 AM   Specimen: STOOL  Result Value Ref Range Status   C Diff antigen NEGATIVE NEGATIVE Final   C Diff toxin NEGATIVE NEGATIVE Final   C Diff interpretation No C. difficile detected.  Final    Comment: Performed at [facility], [address] [place], [place] [address]  Gastrointestinal Panel by PCR , Stool     Status: None   Collection Time: 05/13/23 11:05 AM   Specimen: Stool  Result Value Ref Range Status   Campylobacter species NOT DETECTED NOT DETECTED Final   Plesimonas shigelloides NOT DETECTED NOT DETECTED Final   Salmonella species NOT DETECTED NOT DETECTED Final   Yersinia enterocolitica NOT DETECTED NOT  DETECTED Final   Vibrio species NOT DETECTED NOT  DETECTED Final   Vibrio cholerae NOT DETECTED NOT DETECTED Final   Enteroaggregative E coli (EAEC) NOT DETECTED NOT DETECTED Final   Enteropathogenic E coli (EPEC) NOT DETECTED NOT DETECTED Final   Enterotoxigenic E coli (ETEC) NOT DETECTED NOT DETECTED Final   Shiga like toxin producing E coli (STEC) NOT DETECTED NOT DETECTED Final   Shigella/Enteroinvasive E coli (EIEC) NOT DETECTED NOT DETECTED Final   Cryptosporidium NOT DETECTED NOT DETECTED Final   Cyclospora cayetanensis NOT DETECTED NOT DETECTED Final   Entamoeba histolytica NOT DETECTED NOT DETECTED Final   Giardia lamblia NOT DETECTED NOT DETECTED Final   Adenovirus F40/41 NOT DETECTED NOT DETECTED Final   Astrovirus NOT DETECTED NOT DETECTED Final   Norovirus GI/GII NOT DETECTED NOT DETECTED Final   Rotavirus A NOT DETECTED NOT DETECTED Final   Sapovirus (I, II, IV, and V) NOT DETECTED NOT DETECTED Final    Comment: Performed at [facility], [address] [place], [place] [address]  Blood Culture (routine x 2)     Status: None (Preliminary result)   Collection Time: 05/15/23 10:16 AM   Specimen: BLOOD  Result Value Ref Range Status   Specimen Description BLOOD BLD PORTA CATH  Final   Special Requests   Final    BOTTLES DRAWN AEROBIC AND ANAEROBIC Blood Culture adequate volume   Culture   Final    NO GROWTH 4 DAYS Performed at [facility], [address] [place], [place] [address]    Report Status PENDING  Incomplete  Culture, blood (Routine X 2) w Reflex to ID Panel     Status: None (Preliminary result)   Collection Time: 05/15/23  5:00 PM   Specimen: BLOOD  Result Value Ref Range Status   Specimen Description BLOOD BLOOD RIGHT FOREARM  Final   Special Requests   Final    BOTTLES DRAWN AEROBIC AND ANAEROBIC Blood Culture adequate volume   Culture   Final    NO GROWTH 4 DAYS Performed at [facility], [address] [place],  [place] [address]    Report Status PENDING  Incomplete  C Difficile Quick Screen w PCR reflex     Status: None   Collection Time: 05/16/23  1:00 PM   Specimen: STOOL  Result Value Ref Range Status   C Diff antigen NEGATIVE NEGATIVE Final   C Diff toxin NEGATIVE NEGATIVE Final   C Diff interpretation No C. difficile detected.  Final    Comment: Performed at [facility], [address] [place], [place] [address]  Gastrointestinal Panel by PCR , Stool     Status: None   Collection Time: 05/16/23  1:00 PM   Specimen: Stool  Result Value Ref Range Status   Campylobacter species NOT DETECTED NOT DETECTED Final   Plesimonas shigelloides NOT DETECTED NOT DETECTED Final   Salmonella species NOT DETECTED NOT DETECTED Final   Yersinia enterocolitica NOT DETECTED NOT DETECTED Final   Vibrio species NOT DETECTED NOT DETECTED Final   Vibrio cholerae NOT DETECTED NOT DETECTED Final   Enteroaggregative E coli (EAEC) NOT DETECTED NOT DETECTED Final   Enteropathogenic E coli (EPEC) NOT DETECTED NOT DETECTED Final   Enterotoxigenic E coli (ETEC) NOT DETECTED NOT DETECTED Final   Shiga like toxin producing E coli (STEC) NOT DETECTED NOT DETECTED Final   Shigella/Enteroinvasive E coli (EIEC) NOT DETECTED NOT DETECTED Final   Cryptosporidium NOT DETECTED NOT DETECTED Final   Cyclospora cayetanensis NOT DETECTED NOT DETECTED Final   Entamoeba histolytica  NOT DETECTED NOT DETECTED Final   Giardia lamblia NOT DETECTED NOT DETECTED Final   Adenovirus F40/41 NOT DETECTED NOT DETECTED Final   Astrovirus NOT DETECTED NOT DETECTED Final   Norovirus GI/GII NOT DETECTED NOT DETECTED Final   Rotavirus A NOT DETECTED NOT DETECTED Final   Sapovirus (I, II, IV, and V) NOT DETECTED NOT DETECTED Final    Comment: Performed at [facility], [address] [place], [place] [address]         Radiology Studies: No results found.      Scheduled Meds:  [MAR Hold] atorvastatin  20 mg Oral Daily    [MAR Hold] Chlorhexidine Gluconate Cloth  6 each Topical Daily   [MAR Hold] feeding supplement  237 mL Oral BID BM   [MAR Hold] insulin aspart  0-5 Units Subcutaneous QHS   [MAR Hold] insulin aspart  0-9 Units Subcutaneous TID WC   [MAR Hold] metoprolol tartrate  25 mg Oral BID   [MAR Hold] polyvinyl alcohol  1 drop Both Eyes QID   [MAR Hold] potassium chloride  40 mEq Oral Once   [MAR Hold] prednisoLONE acetate  1 drop Both Eyes QID   [MAR Hold] psyllium  1 packet Oral Daily   Continuous Infusions:  sodium chloride Stopped (05/19/23 1002)   sodium chloride 20 mL/hr at 05/19/23 1033     LOS: 4 days   Tresa Moore, MD [facility]   If 7PM-7AM, please contact night-coverage  05/19/2023, 10:45 AM
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
## 2023-05-19 NOTE — Op Note (Signed)
[facility] Gastroenterology Patient Name: Paul Flowers Procedure Date: 05/19/2023 11:14 AM MRN: [id number] Account #: [id number] Date of Birth: 1970/11/09 Admit Type: Inpatient Age: 52 Room: [facility] ENDO ROOM 2 Gender: Male Note Status: Finalized Instrument Name: Peds Colonoscope [id number] Procedure:             Colonoscopy Indications:           Diarrhea Providers:             [name] MD, MD Referring MD:          [name], MD (Referring MD) Medicines:             Monitored Anesthesia Care Complications:         No immediate complications. Procedure:             Pre-Anesthesia Assessment:                        - Prior to the procedure, a History and Physical was                         performed, and patient medications, allergies and                         sensitivities were reviewed. The patient's tolerance                         of previous anesthesia was reviewed.                        - The risks and benefits of the procedure and the                         sedation options and risks were discussed with the                         patient. All questions were answered and informed                         consent was obtained.                        - ASA Grade Assessment: II - A patient with mild                         systemic disease.                        After obtaining informed consent, the colonoscope was                         passed under direct vision. Throughout the procedure,                         the patient's blood pressure, pulse, and oxygen                         saturations were monitored continuously. The                         Colonoscope was introduced through the  anus and                         advanced to the the cecum, identified by the                         appendiceal orifice. The colonoscopy was performed                         with ease. The patient tolerated the procedure well.                         The  quality of the bowel preparation was good. Findings:      The perianal and digital rectal examinations were normal.      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 Biopsies were taken with       a cold forceps for histology. Impression:            - Patchy moderate inflammation was found in the entire                         examined colon. Biopsied. Recommendation:        - Return patient to hospital ward for ongoing care.                        - Advance diet as tolerated.                        - Continue present medications.                        - Await pathology results. Procedure Code(s):     --- Professional ---                        [phone], Colonoscopy, flexible; with biopsy, single or                         multiple Diagnosis Code(s):     --- Professional ---                        K52.9, Noninfective gastroenteritis and colitis,                         unspecified                        R19.7, Diarrhea, unspecified CPT copyright 2022 [employer]. All rights reserved. The codes documented in this report are preliminary and upon coder review may  be revised to meet current compliance requirements. [name], MD [name] MD, MD 05/19/2023 11:28:45 AM This report has been signed electronically. Number of Addenda: 0 Note Initiated On: 05/19/2023 11:14 AM Scope Withdrawal Time: 0 hours 5 minutes 28 seconds  Total Procedure Duration: 0 hours 6 minutes 52 seconds  Estimated Blood Loss:  Estimated blood loss: none.      [facility]

## 2023-05-19 NOTE — Transfer of Care (Signed)
Immediate Anesthesia Transfer of Care Note  Patient: Paul Flowers  Procedure(s) Performed: COLONOSCOPY WITH PROPOFOL BIOPSY  Patient Location: PACU and Endoscopy Unit  Anesthesia Type:MAC  Level of Consciousness: drowsy  Airway & Oxygen Therapy: Patient Spontanous Breathing and Patient connected to nasal cannula oxygen  Post-op Assessment: Report given to RN and Post -op Vital signs reviewed and stable  Post vital signs: Reviewed and stable  Last Vitals:  Vitals Value Taken Time  BP 94/61 05/19/23 1130  Temp    Pulse 93 05/19/23 1130  Resp 18 05/19/23 1130  SpO2 100 % 05/19/23 1130  Vitals shown include unfiled device data.  Last Pain:  Vitals:   05/19/23 1026  TempSrc: Temporal  PainSc: 0-No pain         Complications: No notable events documented.
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
## 2023-05-20 ENCOUNTER — Encounter: Payer: Self-pay | Admitting: Gastroenterology

## 2023-05-20 DIAGNOSIS — R112 Nausea with vomiting, unspecified: Secondary | ICD-10-CM | POA: Diagnosis not present

## 2023-05-20 LAB — QUANTIFERON-TB GOLD PLUS (RQFGPL)
QuantiFERON Mitogen Value: 0.06 [IU]/mL
QuantiFERON Nil Value: 0.04 [IU]/mL
QuantiFERON TB1 Ag Value: 0.04 [IU]/mL
QuantiFERON TB2 Ag Value: 0.17 [IU]/mL

## 2023-05-20 LAB — CULTURE, BLOOD (ROUTINE X 2)
Culture: NO GROWTH
Culture: NO GROWTH
Special Requests: ADEQUATE
Special Requests: ADEQUATE

## 2023-05-20 LAB — BASIC METABOLIC PANEL
Anion gap: 8 (ref 5–15)
BUN: 14 mg/dL (ref 6–20)
CO2: 17 mmol/L — ABNORMAL LOW (ref 22–32)
Calcium: 8.6 mg/dL — ABNORMAL LOW (ref 8.9–10.3)
Chloride: 111 mmol/L (ref 98–111)
Creatinine, Ser: 1 mg/dL (ref 0.61–1.24)
GFR, Estimated: >60 mL/min (ref >60)
Glucose, Bld: 118 mg/dL — ABNORMAL HIGH (ref 70–99)
Potassium: 3.5 mmol/L (ref 3.5–5.1)
Sodium: 136 mmol/L (ref 135–145)

## 2023-05-20 LAB — GLUCOSE, CAPILLARY: Glucose-Capillary: 92 mg/dL (ref 70–99)

## 2023-05-20 LAB — QUANTIFERON-TB GOLD PLUS: QuantiFERON-TB Gold Plus: UNDETERMINED — AB

## 2023-05-20 LAB — SURGICAL PATHOLOGY

## 2023-05-20 MED ORDER — PSYLLIUM 95 % PO PACK: 1.0000 | PACK | Freq: Every day | ORAL | Status: DC

## 2023-05-20 MED ORDER — HEPARIN SOD (PORK) LOCK FLUSH 100 UNIT/ML IV SOLN
500.0000 [IU] | Freq: Once | INTRAVENOUS | Status: AC
  Administered 2023-05-20: 500 [IU] via INTRAVENOUS
  Filled 2023-05-20: qty 5

## 2023-05-20 MED ORDER — DIPHENOXYLATE-ATROPINE 2.5-0.025 MG PO TABS: 1.0000 | ORAL_TABLET | Freq: Four times a day (QID) | ORAL | 1 refills | Status: DC | PRN

## 2023-05-20 MED ORDER — METOPROLOL TARTRATE 25 MG PO TABS: 12.5000 mg | ORAL_TABLET | Freq: Two times a day (BID) | ORAL | Status: DC

## 2023-05-20 MED ORDER — PREDNISOLONE ACETATE 1 % OP SUSP: 1.0000 [drp] | Freq: Four times a day (QID) | OPHTHALMIC | 0 refills | Status: DC

## 2023-05-20 MED ORDER — PREDNISONE 20 MG PO TABS
60.0000 mg | ORAL_TABLET | Freq: Every day | ORAL | 0 refills | Status: AC
Start: 2023-05-20 — End: 2023-05-30

## 2023-05-20 NOTE — Discharge Summary (Signed)
Physician Discharge Summary  Paul Flowers ZOX:[id number] DOB: 05-29-1971 DOA: 05/15/2023  PCP: [name], MD  Admit date: 05/15/2023 Discharge date: 05/20/2023  Admitted From: Home Disposition:  Home  Recommendations for Outpatient Follow-up:  Follow up with PCP in 1-2 weeks Follow-up with oncology next week Follow-up with GI as directed Follow-up with ophthalmology next week  Home Health:No Equipment/Devices:None   Discharge Condition:Stable  CODE STATUS:FULL  Diet recommendation: Reg  Brief/Interim Summary:  Paul Flowers is a 52 y.o. male with medical history significant of renal cell carcinoma status post nephrectomy with metastasis to the bone, HFrEF, bilateral PE, iron deficiency anemia, GERD, hypertension presenting with intractable nausea vomiting.  Patient reports recurrent nausea vomiting over the past 1 to 2 weeks.  Noted to be followed by Dr. Michae Kava outpatient for renal cell carcinoma.  Noted to be status post nephrectomy with metastasis to the bone on immunotherapy as well as radiation.  Positive generalized weakness and fatigue.  Was evaluated by Dr. Michae Kava.  Symptoms felt to be related to immunotherapy.  No fevers or chills.  Mild abdominal pain.  Mild nonbloody nonbilious diarrhea.  No focal hemiparesis or confusion.  Baseline pulmonary embolism on Eliquis.  Noted to have recent travel to [place].  Missed some doses of Eliquis.  No chest pain or shortness of breath at present.     Discharge Diagnoses:  Principal Problem:   Intractable nausea and vomiting Active Problems:   Multiple subsegmental pulmonary emboli without acute cor pulmonale (HCC)   GERD   OSA (obstructive sleep apnea)   Hypertension associated with diabetes (HCC)   Controlled type 2 diabetes mellitus without complication, without long-term current use of insulin (HCC)   Renal cell cancer (HCC)   Left ventricular systolic dysfunction (LVSD)   Conjunctivitis   Drug-induced colitis    Dehydration   High risk medication use   Malnutrition of moderate degree # Nausea, vomiting, diarrhea # Immunotherapy colitis Recent gi pathogen panel and c diff negative. CT from a few days ago showing enteritis. Oncology thinks probably side effect of immunotherapy.  Diarrhea persistent Plan: Diarrhea has improved.  Stable for discharge.  Continue Lomotil on DC.  Continue psyllium on DC.  Patient will follow-up in cancer center next week.  60 mg prednisone daily prescribed per oncology recommendations.  Discussed case with GI.  GI office will initiate insurance authorization for [facility] and will contact patient once medication is available.   # Red eye Bilateral, with red flags of photosensitivity and eye pain. Improving with prednisolone. I spoke w/ dr. Inez Pilgrim of ophtho 11/11, he is concerned for iritis given immune suppression and red flag symptoms. He advises holding abx, continuing prednisolone eye drops, and close f/u in his clinic this week, they will see him in the hospital this week if he doesn't discharge in the next day or two Plan: - continue prednisolone eye drops -Appreciate ophthalmology consultation -Follow-up next week at [facility]   # PE Diagnosed in June, has been compliant w/ apixaban. Asymptomatic -Apixaban restarted on discharge   Discharge Instructions  Discharge Instructions     Diet - low sodium heart healthy   Complete by: As directed    Increase activity slowly   Complete by: As directed       Allergies as of 05/20/2023   No Known Allergies      Medication List     STOP taking these medications    ciprofloxacin 0.3 % ophthalmic solution Commonly known as: CILOXAN  potassium chloride SA 20 MEQ tablet Commonly known as: KLOR-CON M       TAKE these medications    apixaban 5 MG Tabs tablet Commonly known as: ELIQUIS Take 1 tablet (5 mg total) by mouth 2 (two) times daily.   atorvastatin 20 MG tablet Commonly known as:  LIPITOR TAKE 1 TABLET BY MOUTH EVERY DAY   Dapagliflozin Pro-metFORMIN ER 11-998 MG Tb24 Commonly known as: Xigduo XR Take 1 tablet by mouth daily.   diphenoxylate-atropine 2.5-0.025 MG tablet Commonly known as: LOMOTIL Take 1 tablet by mouth 4 (four) times daily as needed for diarrhea or loose stools.   docusate sodium 100 MG capsule Commonly known as: COLACE Take 1 capsule (100 mg total) by mouth 2 (two) times daily.   fluticasone 50 MCG/ACT nasal spray Commonly known as: FLONASE Place 1 spray into both nostrils daily.   HYDROcodone-acetaminophen 5-325 MG tablet Commonly known as: Norco Take 1-2 tablets by mouth every 6 (six) hours as needed for moderate pain or severe pain.   lidocaine-prilocaine cream Commonly known as: EMLA Apply 1 Application topically as needed. Apply to port 1 hour prior to use. Cover with plastic wrap.   losartan 25 MG tablet Commonly known as: COZAAR Take 0.5 tablets (12.5 mg total) by mouth daily.   magic mouthwash (multi-ingredient) oral suspension Swish and swallow 5-10 mLs 4 (four) times daily.   magic mouthwash (multi-ingredient) oral suspension Swish and swallow 5-10 mLs 4 (four) times daily as needed.   metoprolol tartrate 25 MG tablet Commonly known as: LOPRESSOR Take 1 tablet (25 mg total) by mouth 2 (two) times daily.   montelukast 10 MG tablet Commonly known as: SINGULAIR TAKE 1 TABLET BY MOUTH EVERYDAY AT BEDTIME   omeprazole 20 MG capsule Commonly known as: PRILOSEC TAKE 1 CAPSULE BY MOUTH EVERY DAY   ondansetron 8 MG tablet Commonly known as: ZOFRAN Take 1 tablet (8 mg total) by mouth every 8 (eight) hours as needed for nausea or vomiting.   ONE TOUCH LANCETS Misc Use to test blood sugar once daily   oxyCODONE-acetaminophen 5-325 MG tablet Commonly known as: PERCOCET/ROXICET Take 1 tablet by mouth every 8 (eight) hours as needed for up to 30 doses for severe pain.   prednisoLONE acetate 1 % ophthalmic  suspension Commonly known as: PRED FORTE Place 1 drop into both eyes 4 (four) times daily.   predniSONE 20 MG tablet Commonly known as: DELTASONE Take 3 tablets (60 mg total) by mouth daily for 10 days.   psyllium 95 % Pack Commonly known as: HYDROCIL/METAMUCIL Take 1 packet by mouth daily. Start taking on: May 21, 2023        Follow-up Information     [name], MD Follow up.   Specialty: Internal Medicine Contact information: [address] [place] [address] [phone]         Lockie Mola, MD. Schedule an appointment as soon as possible for a visit in 1 week(s).   Specialty: Ophthalmology Contact information: [address]   [place] [address] [phone]         [name], MD Follow up.   Specialty: Gastroenterology Why: Dr. Johnney Killian office is working on Therapist, occupational for medication [employer].  You will receive a call when they are able to schedule your appointment Contact information: [address] [place] [address] [phone]         Michaelyn Barter, MD. Schedule an appointment as soon as possible for a visit in  1 week(s).   Specialty: Oncology Contact information: [address] [place] [address] [phone]                No Known Allergies  Consultations: GI Oncology   Procedures/Studies: CT Angio Chest PE W/Cm &/Or Wo Cm  Result Date: 05/15/2023 CLINICAL DATA:  Suspected pulmonary embolism. EXAM: CT ANGIOGRAPHY CHEST WITH CONTRAST TECHNIQUE: Multidetector CT imaging of the chest was performed using the standard protocol during bolus administration of intravenous contrast. Multiplanar CT image reconstructions and MIPs were obtained to evaluate the vascular anatomy. RADIATION DOSE REDUCTION: This exam was performed according to the departmental dose-optimization program which includes automated exposure control, adjustment of the mA and/or kV according to  patient size and/or use of iterative reconstruction technique. CONTRAST:  75mL OMNIPAQUE IOHEXOL 350 MG/ML SOLN COMPARISON:  May 02, 2023 FINDINGS: Cardiovascular: A right-sided venous Port-A-Cath is in place. There is mild calcification of the aortic arch, without evidence of an aortic aneurysm. Satisfactory opacification of the pulmonary arteries to the segmental level. No evidence of pulmonary embolism. Normal heart size. No pericardial effusion. Mediastinum/Nodes: No enlarged mediastinal, hilar, or axillary lymph nodes. Thyroid gland, trachea, and esophagus demonstrate no significant findings. Lungs/Pleura: Lungs are clear. No pleural effusion or pneumothorax. Upper Abdomen: The left kidney is surgically absent. Musculoskeletal: No chest wall abnormality. No acute or significant osseous findings. Review of the MIP images confirms the above findings. IMPRESSION: 1. No evidence of pulmonary embolism or acute cardiopulmonary disease. 2. Evidence of prior left nephrectomy. 3. Aortic atherosclerosis. Aortic Atherosclerosis (ICD10-I70.0). Electronically Signed   By: Aram Candela M.D.   On: 05/15/2023 18:16   US ABDOMEN LIMITED RUQ (LIVER/GB)  Result Date: 05/15/2023 CLINICAL DATA:  5 day history of right upper quadrant pain. Prior left nephrectomy. EXAM: ULTRASOUND ABDOMEN LIMITED RIGHT UPPER QUADRANT COMPARISON:  05/13/2023 FINDINGS: Gallbladder: Layering sludge identified in the gallbladder with a 6 mm shadowing stone evident. Gallbladder wall thickness upper normal to 3 mm. No sonographic Murphy sign. Common bile duct: Diameter: 2-3 mm Liver: Left hepatic lobe is obscured by bowel gas. Liver otherwise unremarkable. Portal vein is patent on color Doppler imaging with normal direction of blood flow towards the liver. Other: None. IMPRESSION: 1. Layering sludge and 6 mm shadowing stone in the gallbladder. Gallbladder wall thickness upper normal to 3 mm. No sonographic Murphy sign. 2. Left hepatic lobe is  obscured by bowel gas. Electronically Signed   By: Kennith Center M.D.   On: 05/15/2023 13:38   DG Chest Port 1 View  Result Date: 05/15/2023 CLINICAL DATA:  Questionable sepsis. History of renal cell carcinoma. EXAM: PORTABLE CHEST 1 VIEW COMPARISON:  12/22/2022 FINDINGS: Right chest wall port a catheter is identified with tip in the projection of the superior cavoatrial junction. Heart size and mediastinal contours are unremarkable. No pleural fluid or interstitial edema. No airspace opacities identified. Visualized osseous structures are unremarkable. IMPRESSION: No acute cardiopulmonary abnormalities. Electronically Signed   By: Signa Kell M.D.   On: 05/15/2023 11:04   CT ABDOMEN PELVIS W CONTRAST  Result Date: 05/13/2023 CLINICAL DATA:  Renal cell carcinoma, prior left nephrectomy and adrenalectomy on 12/15/2022. Diarrhea. Nausea and vomiting. * Tracking Code: BO * EXAM: CT ABDOMEN AND PELVIS WITH CONTRAST TECHNIQUE: Multidetector CT imaging of the abdomen and pelvis was performed using the standard protocol following bolus administration of intravenous contrast. RADIATION DOSE REDUCTION: This exam was performed according to the departmental dose-optimization program which includes automated exposure control, adjustment of the mA  and/or kV according to patient size and/or use of iterative reconstruction technique. CONTRAST:  OMNIPAQUE IOHEXOL 300 MG/ML  SOLN COMPARISON:  05/02/2023 FINDINGS: Lower chest: Port-A-Cath tip: Lower right atrium. Hepatobiliary: Suspected gallstone along the gallbladder fundus along a presumed Phrygian cap. Otherwise unremarkable. Pancreas: Unremarkable Spleen: Unremarkable Adrenals/Urinary Tract: Right adrenal gland and right renal parenchyma appear normal. Prior left adrenalectomy and left nephrectomy. Nonobstructive 5 mm right kidney lower pole calculus. Along the left retroperitoneum/periaortic region and in the vicinity of the previous postoperative fluid  collection shown on 12/23/2022, we demonstrate a 1.5 by 1.6 by 2.1 cm hypodense lesion just below a staple line, images 34-38 of series 2 and image 83 series 4. This has an internal density of 15 Hounsfield units. Possibilities include persistent small postoperative fluid collection versus a pathologic retroperitoneal lymph node. This appearance is not changed from 05/02/2023. Urinary bladder unremarkable. Stomach/Bowel: Orally administered contrast medium extends all the way to the rectum with scattered air-fluid levels in the distal rectum compatible with patient's known diarrheal process. There are a few scattered colonic diverticula no findings of active diverticulitis, and no substantial wall thickening in the rectum. No dilated small bowel loops. Potential mild wall thickening of couple of left-sided small bowel loops for example on image 76 series 4, raise the possibility of mild enteritis. Vascular/Lymphatic: As noted in the urinary tract section above, a left periaortic 1.5 cm in short axis structure could represent postoperative fluid collection or a low-density retroperitoneal lymph node. There is increased prominence of left upper quadrant mesenteric lymph nodes compared to 05/02/2023, including a 1.0 cm mesenteric node on image 31 series 2 which previously measured 0.6 cm. These are likely reactive. 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 The small amount of filling defect is only about 10 by 6 mm in size on image 25 series 2. It would be unusual although not impossible for bland thrombus to continue to persist in this manner. Reproductive:  Unremarkable Other: No supplemental non-categorized findings. Musculoskeletal: Mild spurring of the right femoral head. IMPRESSION: 1. Mild wall thickening in a couple of loops of small bowel in the left abdomen with reactive mesenteric lymph nodes, favoring enteritis. Air fluid levels in the distal colon compatible with diarrheal process. No colon wall thickening. 2. 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 3. 1.5 cm in short axis lymph node versus chronic postoperative fluid collection in the left periaortic region. Favor postoperative fluid collection but surveillance is recommended. 4. Gallstone along the gallbladder fundus along a presumed Phrygian cap. 5. Nonobstructive 5 mm right kidney lower pole calculus. 6. Mild spurring of the right femoral head. Electronically Signed   By: Gaylyn Rong M.D.   On: 05/13/2023 16:26   CT CHEST ABDOMEN PELVIS W CONTRAST  Result Date: 05/06/2023 CLINICAL DATA:  Renal cell cancer. EXAM: CT CHEST, ABDOMEN, AND PELVIS  WITH CONTRAST TECHNIQUE: Multidetector CT imaging of the chest, abdomen and pelvis was performed following the standard protocol during bolus administration of intravenous contrast. RADIATION DOSE REDUCTION: This exam was performed according to the departmental dose-optimization program which includes automated exposure control, adjustment of the mA and/or kV according to patient size and/or use of iterative reconstruction technique. CONTRAST:  85mL OMNIPAQUE IOHEXOL 300 MG/ML  SOLN COMPARISON:  12/19/2022. FINDINGS: CT CHEST FINDINGS Cardiovascular: No significant vascular findings. Normal heart size. No pericardial effusion. Mediastinum/Nodes: No enlarged mediastinal,  hilar, or axillary lymph nodes. Thyroid gland, trachea, and esophagus demonstrate no significant findings. Lungs/Pleura: Lungs are clear. No pleural effusion or pneumothorax. Musculoskeletal: No chest wall mass or suspicious bone lesions identified. Right-sided Port-A-Cath tip is at the RA/SVC junction. CT ABDOMEN PELVIS FINDINGS Hepatobiliary: No focal liver abnormality is seen. No gallstones, gallbladder wall thickening, or biliary dilatation. Pancreas: Unremarkable. No pancreatic ductal dilatation or surrounding inflammatory changes. Spleen: Normal in size without focal abnormality. Adrenals/Urinary Tract: Status post left nephrectomy and adrenalectomy. Empty renal fossa on the left. No right-sided hydronephrosis or parenchymal lesions. There is a midpole 4 mm nonobstructing stone. Unremarkable urinary bladder. Stomach/Bowel: Stomach is within normal limits. Appendix appears normal. No evidence of bowel wall thickening, distention, or inflammatory changes. Diverticula noted in the sigmoid. Vascular/Lymphatic: No significant vascular findings are present. No enlarged abdominal or pelvic lymph nodes. Reproductive: Prostate is unremarkable. Other: No abdominal wall hernia or abnormality. No abdominopelvic ascites. Musculoskeletal: 2.4 cm osteolytic lesion right femoral neck consistent with known metastatic disease. IMPRESSION: 1. Status post left nephrectomy and adrenalectomy. 2. Right-sided nonobstructing nephrolithiasis. 3. Sigmoid diverticulosis. 4. Right femoral neck metastatic lesion. Electronically Signed   By: Layla Maw M.D.   On: 05/06/2023 20:24      Subjective: Seen and examined on the day of discharge.  Stable no distress.  Appropriate for discharge home.  Discharge Exam: Vitals:   05/20/23 0435 05/20/23 0818  BP: 105/74 104/76  Pulse: 77 97  Resp: 18 15  Temp: 98.2 F (36.8 C) 98.7 F (37.1 C)  SpO2: 99% 99%   Vitals:   05/19/23 1636 05/19/23 2105 05/20/23 0435 05/20/23 0818   BP: 109/76 104/70 105/74 104/76  Pulse: (!) 110 94 77 97  Resp: 16 18 18 15   Temp: 98.4 F (36.9 C) 98.2 F (36.8 C) 98.2 F (36.8 C) 98.7 F (37.1 C)  TempSrc:      SpO2: 98% 100% 99% 99%  Weight:      Height:        General: Pt is alert, awake, not in acute distress Cardiovascular: RRR, S1/S2 +, no rubs, no gallops Respiratory: CTA bilaterally, no wheezing, no rhonchi Abdominal: Soft, NT, ND, bowel sounds + Extremities: no edema, no cyanosis    The results of significant diagnostics from this hospitalization (including imaging, microbiology, ancillary and laboratory) are listed below for reference.     Microbiology: Recent Results (from the past 240 hour(s))  Respiratory (~20 pathogens) panel by PCR     Status: None   Collection Time: 05/11/23  3:20 PM   Specimen: Nasopharyngeal Swab; Respiratory  Result Value Ref Range Status   Adenovirus NOT DETECTED NOT DETECTED Final   Coronavirus 229E NOT DETECTED NOT DETECTED Final    Comment: (NOTE) The Coronavirus on the Respiratory Panel, DOES NOT test for the novel  Coronavirus (2019 nCoV)    Coronavirus HKU1 NOT DETECTED NOT DETECTED Final   Coronavirus NL63 NOT DETECTED NOT DETECTED Final   Coronavirus OC43 NOT  DETECTED NOT DETECTED Final   Metapneumovirus NOT DETECTED NOT DETECTED Final   Rhinovirus / Enterovirus NOT DETECTED NOT DETECTED Final   Influenza A NOT DETECTED NOT DETECTED Final   Influenza B NOT DETECTED NOT DETECTED Final   Parainfluenza Virus 1 NOT DETECTED NOT DETECTED Final   Parainfluenza Virus 2 NOT DETECTED NOT DETECTED Final   Parainfluenza Virus 3 NOT DETECTED NOT DETECTED Final   Parainfluenza Virus 4 NOT DETECTED NOT DETECTED Final   Respiratory Syncytial Virus NOT DETECTED NOT DETECTED Final   Bordetella pertussis NOT DETECTED NOT DETECTED Final   Bordetella Parapertussis NOT DETECTED NOT DETECTED Final   Chlamydophila pneumoniae NOT DETECTED NOT DETECTED Final   Mycoplasma pneumoniae NOT  DETECTED NOT DETECTED Final    Comment: Performed at [facility], [address] [place], [place] [address]  Urine culture     Status: None   Collection Time: 05/11/23  3:20 PM   Specimen: Urine, Clean Catch  Result Value Ref Range Status   Specimen Description   Final    URINE, CLEAN CATCH Performed at [facility], [address] [place], [place] [address]    Special Requests   Final    NONE Performed at [facility], [address], [place], [place] [address]    Culture   Final    NO GROWTH Performed at [facility], [address], [place], [place] [address]    Report Status 05/12/2023 FINAL  Final  Calprotectin, Fecal     Status: Abnormal   Collection Time: 05/13/23 11:05 AM   Specimen: Stool  Result Value Ref Range Status   Calprotectin, Fecal 4,740 (H) 0 - 120 ug/g Final    Comment: (NOTE) **Results verified by repeat testing** Concentration     Interpretation   Follow-Up < 5 - 50 ug/g     Normal           None >50 -120 ug/g     Borderline       Re-evaluate in 4-6 weeks    >120 ug/g     Abnormal         Repeat as clinically                                   indicated Performed At: [facility] [address] [place], [place] [id number] Jolene Schimke MD KG:[id number]   C difficile quick screen w PCR reflex     Status: None   Collection Time: 05/13/23 11:05 AM   Specimen: STOOL  Result Value Ref Range Status   C Diff antigen NEGATIVE NEGATIVE Final   C Diff toxin NEGATIVE NEGATIVE Final   C Diff interpretation No C. difficile detected.  Final    Comment: Performed at [facility], [address] [place], [place] [address]  Gastrointestinal Panel by PCR , Stool     Status: None   Collection Time: 05/13/23 11:05 AM   Specimen: Stool  Result Value Ref Range Status   Campylobacter species NOT DETECTED NOT DETECTED Final   Plesimonas shigelloides NOT DETECTED NOT DETECTED Final   Salmonella species NOT DETECTED NOT  DETECTED Final   Yersinia enterocolitica NOT DETECTED NOT DETECTED Final   Vibrio species NOT DETECTED NOT DETECTED Final   Vibrio cholerae NOT DETECTED NOT DETECTED Final   Enteroaggregative E coli (EAEC) NOT DETECTED NOT DETECTED Final   Enteropathogenic E coli (EPEC) NOT  DETECTED NOT DETECTED Final   Enterotoxigenic E coli (ETEC) NOT DETECTED NOT DETECTED Final   Shiga like toxin producing E coli (STEC) NOT DETECTED NOT DETECTED Final   Shigella/Enteroinvasive E coli (EIEC) NOT DETECTED NOT DETECTED Final   Cryptosporidium NOT DETECTED NOT DETECTED Final   Cyclospora cayetanensis NOT DETECTED NOT DETECTED Final   Entamoeba histolytica NOT DETECTED NOT DETECTED Final   Giardia lamblia NOT DETECTED NOT DETECTED Final   Adenovirus F40/41 NOT DETECTED NOT DETECTED Final   Astrovirus NOT DETECTED NOT DETECTED Final   Norovirus GI/GII NOT DETECTED NOT DETECTED Final   Rotavirus A NOT DETECTED NOT DETECTED Final   Sapovirus (I, II, IV, and V) NOT DETECTED NOT DETECTED Final    Comment: Performed at [facility], [address] [place], [place] [address]  Blood Culture (routine x 2)     Status: None   Collection Time: 05/15/23 10:16 AM   Specimen: BLOOD  Result Value Ref Range Status   Specimen Description BLOOD BLD PORTA CATH  Final   Special Requests   Final    BOTTLES DRAWN AEROBIC AND ANAEROBIC Blood Culture adequate volume   Culture   Final    NO GROWTH 5 DAYS Performed at [facility], [address] [place], [place] [address]    Report Status 05/20/2023 FINAL  Final  Culture, blood (Routine X 2) w Reflex to ID Panel     Status: None   Collection Time: 05/15/23  5:00 PM   Specimen: BLOOD  Result Value Ref Range Status   Specimen Description BLOOD BLOOD RIGHT FOREARM  Final   Special Requests   Final    BOTTLES DRAWN AEROBIC AND ANAEROBIC Blood Culture adequate volume   Culture   Final    NO GROWTH 5 DAYS Performed at [facility], [address] [place], [place] [address]    Report Status 05/20/2023 FINAL  Final  C Difficile Quick Screen w PCR reflex     Status: None   Collection Time: 05/16/23  1:00 PM   Specimen: STOOL  Result Value Ref Range Status   C Diff antigen NEGATIVE NEGATIVE Final   C Diff toxin NEGATIVE NEGATIVE Final   C Diff interpretation No C. difficile detected.  Final    Comment: Performed at [facility], [address] [place], [place] [address]  Gastrointestinal Panel by PCR , Stool     Status: None   Collection Time: 05/16/23  1:00 PM   Specimen: Stool  Result Value Ref Range Status   Campylobacter species NOT DETECTED NOT DETECTED Final   Plesimonas shigelloides NOT DETECTED NOT DETECTED Final   Salmonella species NOT DETECTED NOT DETECTED Final   Yersinia enterocolitica NOT DETECTED NOT DETECTED Final   Vibrio species NOT DETECTED NOT DETECTED Final   Vibrio cholerae NOT DETECTED NOT DETECTED Final   Enteroaggregative E coli (EAEC) NOT DETECTED NOT DETECTED Final   Enteropathogenic E coli (EPEC) NOT DETECTED NOT DETECTED Final   Enterotoxigenic E coli (ETEC) NOT DETECTED NOT DETECTED Final   Shiga like toxin producing E coli (STEC) NOT DETECTED NOT DETECTED Final   Shigella/Enteroinvasive E coli (EIEC) NOT DETECTED NOT DETECTED Final   Cryptosporidium NOT DETECTED NOT DETECTED Final   Cyclospora cayetanensis NOT DETECTED NOT DETECTED Final   Entamoeba histolytica NOT DETECTED NOT DETECTED Final   Giardia lamblia NOT DETECTED NOT DETECTED Final   Adenovirus F40/41 NOT DETECTED NOT DETECTED Final   Astrovirus NOT DETECTED NOT DETECTED Final  Norovirus GI/GII NOT DETECTED NOT DETECTED Final   Rotavirus A NOT DETECTED NOT DETECTED Final   Sapovirus (I, II, IV, and V) NOT DETECTED NOT DETECTED Final    Comment: Performed at [facility], [address] [place], [place] [address]     Labs: BNP (last 3 results) Recent Labs    12/19/22 0002 05/15/23 1016  BNP 32.3 14.6    Basic Metabolic Panel: Recent Labs  Lab 05/15/23 1016 05/16/23 0530 05/17/23 0500 05/18/23 0445 05/19/23 0454 05/20/23 0539  NA 133* 131* 134* 135 136 136  K 3.7 3.4* 3.3* 3.2* 3.3* 3.5  CL 101 105 108 107 109 111  CO2 19* 16* 20* 18* 20* 17*  GLUCOSE 74 89 144* 121* 107* 118*  BUN 12 10 9 11 14 14   CREATININE 0.95 0.91 0.75 0.84 0.98 1.00  CALCIUM 8.2* 7.6* 7.9* 8.4* 8.9 8.6*  MG 1.9  --   --   --   --   --    Liver Function Tests: Recent Labs  Lab 05/15/23 1016 05/16/23 0530  AST 22 23  ALT 22 20  ALKPHOS 44 37*  BILITOT 1.7* 1.1  PROT 6.1* 5.4*  ALBUMIN 2.5* 2.1*   Recent Labs  Lab 05/15/23 1016  LIPASE 35   No results for input(s): "AMMONIA" in the last 168 hours. CBC: Recent Labs  Lab 05/15/23 1016 05/16/23 0530 05/18/23 1030  WBC 9.1 8.1 10.5  NEUTROABS 5.5  --  7.7  HGB 13.4 11.3* 11.2*  HCT 38.9* 32.4* 32.8*  MCV 84.4 82.9 83.2  PLT 283 296 416*   Cardiac Enzymes: No results for input(s): "CKTOTAL", "CKMB", "CKMBINDEX", "TROPONINI" in the last 168 hours. BNP: Invalid input(s): "POCBNP" CBG: Recent Labs  Lab 05/19/23 1136 05/19/23 1201 05/19/23 1619 05/19/23 2012 05/20/23 0810  GLUCAP 69* 79 158* 181* 92   D-Dimer No results for input(s): "DDIMER" in the last 72 hours. Hgb A1c No results for input(s): "HGBA1C" in the last 72 hours. Lipid Profile No results for input(s): "CHOL", "HDL", "LDLCALC", "TRIG", "CHOLHDL", "LDLDIRECT" in the last 72 hours. Thyroid function studies No results for input(s): "TSH", "T4TOTAL", "T3FREE", "THYROIDAB" in the last 72 hours.  Invalid input(s): "FREET3" Anemia work up No results for input(s): "VITAMINB12", "FOLATE", "FERRITIN", "TIBC", "IRON", "RETICCTPCT" in the last 72 hours. Urinalysis    Component Value Date/Time   COLORURINE YELLOW (A) 05/15/2023 1409   APPEARANCEUR HAZY (A) 05/15/2023 1409   LABSPEC 1.021 05/15/2023 1409   PHURINE 5.0 05/15/2023 1409   GLUCOSEU 150 (A) 05/15/2023 1409    GLUCOSEU >=1000 (A) 02/22/2023 0947   HGBUR NEGATIVE 05/15/2023 1409   BILIRUBINUR NEGATIVE 05/15/2023 1409   BILIRUBINUR Negative 10/14/2022 0900   KETONESUR 20 (A) 05/15/2023 1409   PROTEINUR 30 (A) 05/15/2023 1409   UROBILINOGEN 0.2 02/22/2023 0947   NITRITE NEGATIVE 05/15/2023 1409   LEUKOCYTESUR NEGATIVE 05/15/2023 1409   Sepsis Labs Recent Labs  Lab 05/15/23 1016 05/16/23 0530 05/18/23 1030  WBC 9.1 8.1 10.5   Microbiology Recent Results (from the past 240 hour(s))  Respiratory (~20 pathogens) panel by PCR     Status: None   Collection Time: 05/11/23  3:20 PM   Specimen: Nasopharyngeal Swab; Respiratory  Result Value Ref Range Status   Adenovirus NOT DETECTED NOT DETECTED Final   Coronavirus 229E NOT DETECTED NOT DETECTED Final    Comment: (NOTE) The Coronavirus on the Respiratory Panel, DOES NOT test for the novel  Coronavirus (2019 nCoV)    Coronavirus HKU1  NOT DETECTED NOT DETECTED Final   Coronavirus NL63 NOT DETECTED NOT DETECTED Final   Coronavirus OC43 NOT DETECTED NOT DETECTED Final   Metapneumovirus NOT DETECTED NOT DETECTED Final   Rhinovirus / Enterovirus NOT DETECTED NOT DETECTED Final   Influenza A NOT DETECTED NOT DETECTED Final   Influenza B NOT DETECTED NOT DETECTED Final   Parainfluenza Virus 1 NOT DETECTED NOT DETECTED Final   Parainfluenza Virus 2 NOT DETECTED NOT DETECTED Final   Parainfluenza Virus 3 NOT DETECTED NOT DETECTED Final   Parainfluenza Virus 4 NOT DETECTED NOT DETECTED Final   Respiratory Syncytial Virus NOT DETECTED NOT DETECTED Final   Bordetella pertussis NOT DETECTED NOT DETECTED Final   Bordetella Parapertussis NOT DETECTED NOT DETECTED Final   Chlamydophila pneumoniae NOT DETECTED NOT DETECTED Final   Mycoplasma pneumoniae NOT DETECTED NOT DETECTED Final    Comment: Performed at [facility], [address] [place], [place] [address]  Urine culture     Status: None   Collection Time: 05/11/23  3:20 PM   Specimen:  Urine, Clean Catch  Result Value Ref Range Status   Specimen Description   Final    URINE, CLEAN CATCH Performed at [facility] [place], [address] [place], [place] [address]    Special Requests   Final    NONE Performed at [facility], [address] [place], [place] [address]    Culture   Final    NO GROWTH Performed at [facility], [address], [place], [place] [address]    Report Status 05/12/2023 FINAL  Final  Calprotectin, Fecal     Status: Abnormal   Collection Time: 05/13/23 11:05 AM   Specimen: Stool  Result Value Ref Range Status   Calprotectin, Fecal 4,740 (H) 0 - 120 ug/g Final    Comment: (NOTE) **Results verified by repeat testing** Concentration     Interpretation   Follow-Up < 5 - 50 ug/g     Normal           None >50 -120 ug/g     Borderline       Re-evaluate in 4-6 weeks    >120 ug/g     Abnormal         Repeat as clinically                                   indicated Performed At: [facility] [address] [place], [place] [id number] Jolene Schimke MD GE:[id number]   C difficile quick screen w PCR reflex     Status: None   Collection Time: 05/13/23 11:05 AM   Specimen: STOOL  Result Value Ref Range Status   C Diff antigen NEGATIVE NEGATIVE Final   C Diff toxin NEGATIVE NEGATIVE Final   C Diff interpretation No C. difficile detected.  Final    Comment: Performed at [facility], [address] [place], [place] [address]  Gastrointestinal Panel by PCR , Stool     Status: None   Collection Time: 05/13/23 11:05 AM   Specimen: Stool  Result Value Ref Range Status   Campylobacter species NOT DETECTED NOT DETECTED Final   Plesimonas shigelloides NOT DETECTED NOT DETECTED Final   Salmonella species NOT DETECTED NOT DETECTED Final   Yersinia enterocolitica NOT DETECTED NOT DETECTED Final   Vibrio species NOT DETECTED NOT DETECTED Final   Vibrio cholerae NOT DETECTED NOT DETECTED Final  Enteroaggregative E coli  (EAEC) NOT DETECTED NOT DETECTED Final   Enteropathogenic E coli (EPEC) NOT DETECTED NOT DETECTED Final   Enterotoxigenic E coli (ETEC) NOT DETECTED NOT DETECTED Final   Shiga like toxin producing E coli (STEC) NOT DETECTED NOT DETECTED Final   Shigella/Enteroinvasive E coli (EIEC) NOT DETECTED NOT DETECTED Final   Cryptosporidium NOT DETECTED NOT DETECTED Final   Cyclospora cayetanensis NOT DETECTED NOT DETECTED Final   Entamoeba histolytica NOT DETECTED NOT DETECTED Final   Giardia lamblia NOT DETECTED NOT DETECTED Final   Adenovirus F40/41 NOT DETECTED NOT DETECTED Final   Astrovirus NOT DETECTED NOT DETECTED Final   Norovirus GI/GII NOT DETECTED NOT DETECTED Final   Rotavirus A NOT DETECTED NOT DETECTED Final   Sapovirus (I, II, IV, and V) NOT DETECTED NOT DETECTED Final    Comment: Performed at [facility], [address] [place], [place] [address]  Blood Culture (routine x 2)     Status: None   Collection Time: 05/15/23 10:16 AM   Specimen: BLOOD  Result Value Ref Range Status   Specimen Description BLOOD BLD PORTA CATH  Final   Special Requests   Final    BOTTLES DRAWN AEROBIC AND ANAEROBIC Blood Culture adequate volume   Culture   Final    NO GROWTH 5 DAYS Performed at [facility], [address] [place], [place] [address]    Report Status 05/20/2023 FINAL  Final  Culture, blood (Routine X 2) w Reflex to ID Panel     Status: None   Collection Time: 05/15/23  5:00 PM   Specimen: BLOOD  Result Value Ref Range Status   Specimen Description BLOOD BLOOD RIGHT FOREARM  Final   Special Requests   Final    BOTTLES DRAWN AEROBIC AND ANAEROBIC Blood Culture adequate volume   Culture   Final    NO GROWTH 5 DAYS Performed at [facility], [address] [place], [place] [address]    Report Status 05/20/2023 FINAL  Final  C Difficile Quick Screen w PCR reflex     Status: None   Collection Time: 05/16/23  1:00 PM   Specimen: STOOL  Result Value  Ref Range Status   C Diff antigen NEGATIVE NEGATIVE Final   C Diff toxin NEGATIVE NEGATIVE Final   C Diff interpretation No C. difficile detected.  Final    Comment: Performed at [facility], [address] [place], [place] [address]  Gastrointestinal Panel by PCR , Stool     Status: None   Collection Time: 05/16/23  1:00 PM   Specimen: Stool  Result Value Ref Range Status   Campylobacter species NOT DETECTED NOT DETECTED Final   Plesimonas shigelloides NOT DETECTED NOT DETECTED Final   Salmonella species NOT DETECTED NOT DETECTED Final   Yersinia enterocolitica NOT DETECTED NOT DETECTED Final   Vibrio species NOT DETECTED NOT DETECTED Final   Vibrio cholerae NOT DETECTED NOT DETECTED Final   Enteroaggregative E coli (EAEC) NOT DETECTED NOT DETECTED Final   Enteropathogenic E coli (EPEC) NOT DETECTED NOT DETECTED Final   Enterotoxigenic E coli (ETEC) NOT DETECTED NOT DETECTED Final   Shiga like toxin producing E coli (STEC) NOT DETECTED NOT DETECTED Final   Shigella/Enteroinvasive E coli (EIEC) NOT DETECTED NOT DETECTED Final   Cryptosporidium NOT DETECTED NOT DETECTED Final   Cyclospora cayetanensis NOT DETECTED NOT DETECTED Final   Entamoeba histolytica NOT DETECTED NOT DETECTED Final   Giardia lamblia NOT DETECTED NOT DETECTED Final  Adenovirus F40/41 NOT DETECTED NOT DETECTED Final   Astrovirus NOT DETECTED NOT DETECTED Final   Norovirus GI/GII NOT DETECTED NOT DETECTED Final   Rotavirus A NOT DETECTED NOT DETECTED Final   Sapovirus (I, II, IV, and V) NOT DETECTED NOT DETECTED Final    Comment: Performed at [facility], [address], [place], [place] [address]     Time coordinating discharge: Over 30 minutes  SIGNED:   Tresa Moore, MD  [facility] 05/20/2023, 11:40 AM Pager   If 7PM-7AM, please contact night-coverage

## 2023-05-23 NOTE — Consult Note (Signed)
[facility] Liaison Note  05/23/2023  FENG DEVICH 1971/05/02 [id number]  Location: RN Hospital Liaison screened the patient remotely at [facility].  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 The patient was screened for  readmission hospitalization with noted high risk score for unplanned readmission risk with 3 IP  in 6 months.  The patient was assessed for potential Care Management service needs for post hospital transition for care coordination. Review of patient's electronic medical record reveals patient was admitted with intractable nausea and vomiting. Pt followed heavily by oncology team. No anticipated needs at discharged. Pt discharged home with self care.   [facility] Care Management/Population Health does not replace or interfere with any arrangements made by the Inpatient Transition of Care team.   For questions contact:   Elliot Cousin, RN, [facility] Liaison [place]   [facility], Population Health Office Hours MTWF  8:00 am-6:00 pm Direct Dial: [phone] mobile [phone] [Office toll free line] Office Hours are M-F 8:30 - 5 pm [email or web address]

## 2023-05-24 ENCOUNTER — Inpatient Hospital Stay (HOSPITAL_BASED_OUTPATIENT_CLINIC_OR_DEPARTMENT_OTHER): Payer: Federal, State, Local not specified - PPO | Admitting: Internal Medicine

## 2023-05-24 ENCOUNTER — Inpatient Hospital Stay: Payer: Federal, State, Local not specified - PPO

## 2023-05-24 VITALS — BP 107/78 | HR 90 | Temp 98.3°F | Wt 141.0 lb

## 2023-05-24 DIAGNOSIS — Z7901 Long term (current) use of anticoagulants: Secondary | ICD-10-CM | POA: Diagnosis not present

## 2023-05-24 DIAGNOSIS — C642 Malignant neoplasm of left kidney, except renal pelvis: Secondary | ICD-10-CM

## 2023-05-24 DIAGNOSIS — K529 Noninfective gastroenteritis and colitis, unspecified: Secondary | ICD-10-CM

## 2023-05-24 DIAGNOSIS — Z86711 Personal history of pulmonary embolism: Secondary | ICD-10-CM | POA: Diagnosis not present

## 2023-05-24 DIAGNOSIS — I10 Essential (primary) hypertension: Secondary | ICD-10-CM | POA: Diagnosis not present

## 2023-05-24 DIAGNOSIS — H15103 Unspecified episcleritis, bilateral: Secondary | ICD-10-CM | POA: Diagnosis not present

## 2023-05-24 DIAGNOSIS — R911 Solitary pulmonary nodule: Secondary | ICD-10-CM | POA: Diagnosis not present

## 2023-05-24 DIAGNOSIS — B349 Viral infection, unspecified: Secondary | ICD-10-CM | POA: Diagnosis not present

## 2023-05-24 DIAGNOSIS — C7951 Secondary malignant neoplasm of bone: Secondary | ICD-10-CM

## 2023-05-24 DIAGNOSIS — R112 Nausea with vomiting, unspecified: Secondary | ICD-10-CM

## 2023-05-24 DIAGNOSIS — D509 Iron deficiency anemia, unspecified: Secondary | ICD-10-CM | POA: Diagnosis not present

## 2023-05-24 DIAGNOSIS — N200 Calculus of kidney: Secondary | ICD-10-CM | POA: Diagnosis not present

## 2023-05-24 DIAGNOSIS — R197 Diarrhea, unspecified: Secondary | ICD-10-CM

## 2023-05-24 LAB — CBC WITH DIFFERENTIAL (CANCER CENTER ONLY)
Abs Immature Granulocytes: 0.64 10*3/uL — ABNORMAL HIGH (ref 0.00–0.07)
Basophils Absolute: 0.1 10*3/uL (ref 0.0–0.1)
Basophils Relative: 1 %
Eosinophils Absolute: 0 10*3/uL (ref 0.0–0.5)
Eosinophils Relative: 0 %
HCT: 38.1 % — ABNORMAL LOW (ref 39.0–52.0)
Hemoglobin: 13 g/dL (ref 13.0–17.0)
Immature Granulocytes: 5 %
Lymphocytes Relative: 4 %
Lymphs Abs: 0.6 10*3/uL — ABNORMAL LOW (ref 0.7–4.0)
MCH: 29 pg (ref 26.0–34.0)
MCHC: 34.1 g/dL (ref 30.0–36.0)
MCV: 85 fL (ref 80.0–100.0)
Monocytes Absolute: 0.5 10*3/uL (ref 0.1–1.0)
Monocytes Relative: 3 %
Neutro Abs: 11.7 10*3/uL — ABNORMAL HIGH (ref 1.7–7.7)
Neutrophils Relative %: 87 %
Platelet Count: 490 10*3/uL — ABNORMAL HIGH (ref 150–400)
RBC: 4.48 MIL/uL (ref 4.22–5.81)
RDW: 14.6 % (ref 11.5–15.5)
WBC Count: 13.5 10*3/uL — ABNORMAL HIGH (ref 4.0–10.5)
nRBC: 0 % (ref 0.0–0.2)

## 2023-05-24 LAB — CMP (CANCER CENTER ONLY)
ALT: 29 U/L (ref 0–44)
AST: 20 U/L (ref 15–41)
Albumin: 3.2 g/dL — ABNORMAL LOW (ref 3.5–5.0)
Alkaline Phosphatase: 72 U/L (ref 38–126)
Anion gap: 13 (ref 5–15)
BUN: 22 mg/dL — ABNORMAL HIGH (ref 6–20)
CO2: 19 mmol/L — ABNORMAL LOW (ref 22–32)
Calcium: 8.9 mg/dL (ref 8.9–10.3)
Chloride: 106 mmol/L (ref 98–111)
Creatinine: 1.07 mg/dL (ref 0.61–1.24)
GFR, Estimated: >60 mL/min (ref >60)
Glucose, Bld: 141 mg/dL — ABNORMAL HIGH (ref 70–99)
Potassium: 3.8 mmol/L (ref 3.5–5.1)
Sodium: 138 mmol/L (ref 135–145)
Total Bilirubin: 1.1 mg/dL (ref <1.2)
Total Protein: 6.7 g/dL (ref 6.5–8.1)

## 2023-05-24 LAB — MAGNESIUM: Magnesium: 2.3 mg/dL (ref 1.7–2.4)

## 2023-05-24 MED ORDER — PREDNISONE 10 MG PO TABS: ORAL_TABLET | ORAL | 0 refills | Status: AC

## 2023-05-24 NOTE — Progress Notes (Signed)
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 accordingly  the possibility of residual tumor thrombus is not totally excluded. The small amount of filling defect is only about 10 by 6 mm in size on image 25 series 2. It would be unusual although not impossible for bland thrombus to continue to persist in this manner. Reproductive: Unremarkable Other: No supplemental non-categorized findings. Musculoskeletal: Mild spurring of the right femoral head. IMPRESSION: 1. Mild wall thickening in a couple of loops of small bowel in the left abdomen with reactive mesenteric lymph nodes, favoring enteritis. Air fluid levels in the distal colon compatible with diarrheal process. No colon wall thickening. 2. 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 3. 1.5 cm  in short axis lymph node versus chronic postoperative fluid collection in the left periaortic region. Favor postoperative fluid collection but surveillance is recommended. 4. Gallstone along the gallbladder fundus along a presumed Phrygian cap. 5. Nonobstructive 5 mm right kidney lower pole calculus. 6. Mild spurring of the right femoral head. Electronically Signed   By: Gaylyn Rong M.D.   On: 05/13/2023 16:26   CT CHEST ABDOMEN PELVIS W CONTRAST  Result Date: 05/06/2023 CLINICAL DATA:  Renal cell cancer. EXAM: CT CHEST, ABDOMEN, AND PELVIS WITH CONTRAST TECHNIQUE: Multidetector CT imaging of the chest, abdomen and pelvis was performed following the standard protocol during bolus administration of intravenous contrast. RADIATION DOSE REDUCTION: This exam was performed according to the departmental dose-optimization program which includes automated exposure control, adjustment of the mA and/or kV according to patient size and/or use of iterative reconstruction technique. CONTRAST:  85mL OMNIPAQUE IOHEXOL 300 MG/ML  SOLN COMPARISON:  12/19/2022. FINDINGS: CT CHEST FINDINGS Cardiovascular: No significant vascular findings. Normal heart size. No pericardial effusion. Mediastinum/Nodes: No enlarged mediastinal, hilar, or axillary lymph nodes. Thyroid gland, trachea, and esophagus demonstrate no significant findings. Lungs/Pleura: Lungs are clear. No pleural effusion or pneumothorax. Musculoskeletal: No chest wall mass or suspicious bone lesions identified. Right-sided Port-A-Cath tip is at the RA/SVC junction. CT ABDOMEN PELVIS FINDINGS Hepatobiliary: No focal liver abnormality is seen. No gallstones, gallbladder wall thickening, or biliary dilatation. Pancreas: Unremarkable. No pancreatic ductal dilatation or surrounding inflammatory changes. Spleen: Normal in size without focal abnormality. Adrenals/Urinary Tract: Status post left nephrectomy and adrenalectomy. Empty renal fossa on the left. No right-sided  hydronephrosis or parenchymal lesions. There is a midpole 4 mm nonobstructing stone. Unremarkable urinary bladder. Stomach/Bowel: Stomach is within normal limits. Appendix appears normal. No evidence of bowel wall thickening, distention, or inflammatory changes. Diverticula noted in the sigmoid. Vascular/Lymphatic: No significant vascular findings are present. No enlarged abdominal or pelvic lymph nodes. Reproductive: Prostate is unremarkable. Other: No abdominal wall hernia or abnormality. No abdominopelvic ascites. Musculoskeletal: 2.4 cm osteolytic lesion right femoral neck consistent with known metastatic disease. IMPRESSION: 1. Status post left nephrectomy and adrenalectomy. 2. Right-sided nonobstructing nephrolithiasis. 3. Sigmoid diverticulosis. 4. Right femoral neck metastatic lesion. Electronically Signed   By: Layla Maw M.D.   On: 05/06/2023 20:24   - late Wed - yesterday 2 episodes. Overnight fine.

## 2023-05-24 NOTE — Progress Notes (Signed)
Patient is still a little weak and he is having some chest. He was supposed to have already heard from the GI doctor about a colitis medication that he was supposed to prescribing him.

## 2023-05-24 NOTE — Patient Instructions (Addendum)
Starting Friday 11/22 you can decrease prednisone to 50 mg once daily for 7 days, then 40 mg once daily for 7 days.

## 2023-05-27 ENCOUNTER — Other Ambulatory Visit (HOSPITAL_COMMUNITY): Payer: Self-pay

## 2023-05-27 ENCOUNTER — Encounter: Payer: Self-pay | Admitting: Internal Medicine

## 2023-05-27 ENCOUNTER — Other Ambulatory Visit: Payer: Self-pay

## 2023-05-27 DIAGNOSIS — K529 Noninfective gastroenteritis and colitis, unspecified: Secondary | ICD-10-CM | POA: Insufficient documentation

## 2023-05-27 DIAGNOSIS — H15103 Unspecified episcleritis, bilateral: Secondary | ICD-10-CM | POA: Insufficient documentation

## 2023-05-30 DIAGNOSIS — H209 Unspecified iridocyclitis: Secondary | ICD-10-CM | POA: Diagnosis not present

## 2023-05-30 DIAGNOSIS — H15103 Unspecified episcleritis, bilateral: Secondary | ICD-10-CM | POA: Diagnosis not present

## 2023-06-01 ENCOUNTER — Ambulatory Visit: Payer: Federal, State, Local not specified - PPO | Admitting: Internal Medicine

## 2023-06-01 ENCOUNTER — Encounter: Payer: Self-pay | Admitting: Internal Medicine

## 2023-06-01 VITALS — BP 114/66 | HR 81 | Temp 98.2°F | Resp 16 | Ht 70.0 in | Wt 141.4 lb

## 2023-06-01 DIAGNOSIS — G4733 Obstructive sleep apnea (adult) (pediatric): Secondary | ICD-10-CM | POA: Diagnosis not present

## 2023-06-01 DIAGNOSIS — I152 Hypertension secondary to endocrine disorders: Secondary | ICD-10-CM | POA: Diagnosis not present

## 2023-06-01 DIAGNOSIS — D508 Other iron deficiency anemias: Secondary | ICD-10-CM

## 2023-06-01 DIAGNOSIS — E1159 Type 2 diabetes mellitus with other circulatory complications: Secondary | ICD-10-CM

## 2023-06-01 DIAGNOSIS — E119 Type 2 diabetes mellitus without complications: Secondary | ICD-10-CM

## 2023-06-01 LAB — CBC WITH DIFFERENTIAL/PLATELET
Basophils Absolute: 0 10*3/uL (ref 0.0–0.1)
Basophils Relative: 0.1 % (ref 0.0–3.0)
Eosinophils Absolute: 0 10*3/uL (ref 0.0–0.7)
Eosinophils Relative: 0.2 % (ref 0.0–5.0)
HCT: 38.9 % — ABNORMAL LOW (ref 39.0–52.0)
Hemoglobin: 12.9 g/dL — ABNORMAL LOW (ref 13.0–17.0)
Lymphocytes Relative: 3.2 % — ABNORMAL LOW (ref 12.0–46.0)
Lymphs Abs: 0.5 10*3/uL — ABNORMAL LOW (ref 0.7–4.0)
MCHC: 33 g/dL (ref 30.0–36.0)
MCV: 89.5 fL (ref 78.0–100.0)
Monocytes Absolute: 0.4 10*3/uL (ref 0.1–1.0)
Monocytes Relative: 2.4 % — ABNORMAL LOW (ref 3.0–12.0)
Neutro Abs: 14 10*3/uL — ABNORMAL HIGH (ref 1.4–7.7)
Neutrophils Relative %: 94.1 % — ABNORMAL HIGH (ref 43.0–77.0)
Platelets: 292 10*3/uL (ref 150.0–400.0)
RBC: 4.35 Mil/uL (ref 4.22–5.81)
RDW: 15.5 % (ref 11.5–15.5)
WBC: 14.9 10*3/uL — ABNORMAL HIGH (ref 4.0–10.5)

## 2023-06-01 LAB — URINALYSIS, ROUTINE W REFLEX MICROSCOPIC
Bilirubin Urine: NEGATIVE
Hgb urine dipstick: NEGATIVE
Ketones, ur: NEGATIVE
Leukocytes,Ua: NEGATIVE
Nitrite: NEGATIVE
RBC / HPF: NONE SEEN (ref 0–?)
Specific Gravity, Urine: 1.01 (ref 1.000–1.030)
Total Protein, Urine: NEGATIVE
Urine Glucose: >=1000 — AB
Urobilinogen, UA: 0.2 (ref 0.0–1.0)
pH: 6.5 (ref 5.0–8.0)

## 2023-06-01 LAB — IBC + FERRITIN
Ferritin: 478.1 ng/mL — ABNORMAL HIGH (ref 22.0–322.0)
Iron: 36 ug/dL — ABNORMAL LOW (ref 42–165)
Saturation Ratios: 11.8 % — ABNORMAL LOW (ref 20.0–50.0)
TIBC: 305.2 ug/dL (ref 250.0–450.0)
Transferrin: 218 mg/dL (ref 212.0–360.0)

## 2023-06-01 LAB — BASIC METABOLIC PANEL
BUN: 18 mg/dL (ref 6–23)
CO2: 31 meq/L (ref 19–32)
Calcium: 9.5 mg/dL (ref 8.4–10.5)
Chloride: 99 meq/L (ref 96–112)
Creatinine, Ser: 1.02 mg/dL (ref 0.40–1.50)
GFR: 83.45 mL/min (ref >60.00)
Glucose, Bld: 151 mg/dL — ABNORMAL HIGH (ref 70–99)
Potassium: 4.6 meq/L (ref 3.5–5.1)
Sodium: 138 meq/L (ref 135–145)

## 2023-06-01 LAB — HEMOGLOBIN A1C: Hgb A1c MFr Bld: 6.5 % (ref 4.6–6.5)

## 2023-06-01 MED ORDER — ACCRUFER 30 MG PO CAPS: 1.0000 | ORAL_CAPSULE | Freq: Two times a day (BID) | ORAL | 0 refills | Status: DC

## 2023-06-01 NOTE — Progress Notes (Signed)
Subjective:  Patient ID: Paul Flowers, male    DOB: Oct 25, 1970  Age: 52 y.o. MRN: [id number]  CC: Anemia, Hypertension, and Diabetes   HPI Paul Flowers presents for f/up ---   Discussed the use of AI scribe software for clinical note transcription with the patient, who gave verbal consent to proceed.  History of Present Illness   The patient, with a history of RCC, recently experienced a significant health setback attributed to immunotherapy treatments. He was hospitalized at [facility] due to colitis and ocular inflammation. The colitis presented with severe diarrhea and nausea, leading to substantial weight loss and dehydration. The ocular inflammation was predominantly in the right eye, causing redness and discomfort.  The patient's condition has improved since hospitalization. He is currently on prednisone and eye drops to manage the inflammation. The colitis symptoms have subsided, with the patient reporting normal, solid bowel movements and no further episodes of nausea or vomiting. His appetite has returned, and he is eating and drinking normally.  However, the patient continues to experience fatigue, which he attributes to the weight loss and recovery process. He also reports dry skin, particularly on the hands, which he has been managing with moisturizer.   The patient is due to resume immunotherapy treatments in January. He expresses some apprehension about this, given the recent adverse reactions. He is also considering seeking a second opinion from an oncologist at [facility] in [place].       Outpatient Medications Prior to Visit  Medication Sig Dispense Refill   apixaban (ELIQUIS) 5 MG TABS tablet Take 1 tablet (5 mg total) by mouth 2 (two) times daily. 60 tablet 3   atorvastatin (LIPITOR) 20 MG tablet TAKE 1 TABLET BY MOUTH EVERY DAY 90 tablet 1   Dapagliflozin Pro-metFORMIN ER (XIGDUO XR) 11-998 MG TB24 Take 1 tablet by mouth daily. 90 tablet 1    diphenoxylate-atropine (LOMOTIL) 2.5-0.025 MG tablet Take 1 tablet by mouth 4 (four) times daily as needed for diarrhea or loose stools. 30 tablet 1   fluticasone (FLONASE) 50 MCG/ACT nasal spray Place 1 spray into both nostrils daily. 18.2 mL 2   HYDROcodone-acetaminophen (NORCO) 5-325 MG tablet Take 1-2 tablets by mouth every 6 (six) hours as needed for moderate pain or severe pain. 20 tablet 0   lidocaine-prilocaine (EMLA) cream Apply 1 Application topically as needed. Apply to port 1 hour prior to use. Cover with plastic wrap. 30 g 3   losartan (COZAAR) 25 MG tablet Take 0.5 tablets (12.5 mg total) by mouth daily. 45 tablet 3   magic mouthwash (multi-ingredient) oral suspension Swish and swallow 5-10 mLs 4 (four) times daily. 480 mL 3   magic mouthwash (multi-ingredient) oral suspension Swish and swallow 5-10 mLs 4 (four) times daily as needed. 480 mL 3   metoprolol tartrate (LOPRESSOR) 25 MG tablet Take 1 tablet (25 mg total) by mouth 2 (two) times daily. 60 tablet 3   montelukast (SINGULAIR) 10 MG tablet TAKE 1 TABLET BY MOUTH EVERYDAY AT BEDTIME 90 tablet 1   omeprazole (PRILOSEC) 20 MG capsule TAKE 1 CAPSULE BY MOUTH EVERY DAY 90 capsule 1   ondansetron (ZOFRAN) 8 MG tablet Take 1 tablet (8 mg total) by mouth every 8 (eight) hours as needed for nausea or vomiting. 30 tablet 2   ONE TOUCH LANCETS MISC Use to test blood sugar once daily 200 each 2   oxyCODONE-acetaminophen (PERCOCET/ROXICET) 5-325 MG tablet Take 1 tablet by mouth every 8 (eight) hours as needed  for up to 30 doses for severe pain. 30 tablet 0   prednisoLONE acetate (PRED FORTE) 1 % ophthalmic suspension Place 1 drop into both eyes 4 (four) times daily. 5 mL 0   predniSONE (DELTASONE) 10 MG tablet Take 5 tablets (50 mg total) by mouth daily with breakfast for 7 days, THEN 4 tablets (40 mg total) daily with breakfast for 7 days. 63 tablet 0   docusate sodium (COLACE) 100 MG capsule Take 1 capsule (100 mg total) by mouth 2 (two)  times daily. (Patient not taking: Reported on 06/01/2023)     psyllium (HYDROCIL/METAMUCIL) 95 % PACK Take 1 packet by mouth daily.     No facility-administered medications prior to visit.    ROS Review of Systems  Constitutional:  Positive for fatigue and unexpected weight change (wt loss). Negative for chills and diaphoresis.  Respiratory: Negative.  Negative for cough, chest tightness, shortness of breath and wheezing.   Cardiovascular:  Negative for chest pain, palpitations and leg swelling.  Gastrointestinal:  Negative for abdominal pain, blood in stool, diarrhea, nausea and vomiting.  Genitourinary: Negative.  Negative for difficulty urinating.  Musculoskeletal: Negative.  Negative for arthralgias.  Skin: Negative.  Negative for color change.  Neurological: Negative.  Negative for dizziness and weakness.  Hematological:  Negative for adenopathy. Does not bruise/bleed easily.  Psychiatric/Behavioral: Negative.      Objective:  BP 114/66 (BP Location: Left Arm, Patient Position: Sitting, Cuff Size: Normal)   Pulse 81   Temp 98.2 F (36.8 C) (Oral)   Resp 16   Ht 5\' 10"  (1.778 m)   Wt 141 lb 6.4 oz (64.1 kg)   SpO2 98%   BMI 20.29 kg/m   BP Readings from Last 3 Encounters:  06/01/23 114/66  05/24/23 107/78  05/20/23 104/76    Wt Readings from Last 3 Encounters:  06/01/23 141 lb 6.4 oz (64.1 kg)  05/24/23 141 lb (64 kg)  05/15/23 154 lb 1.6 oz (69.9 kg)    Physical Exam Vitals reviewed.  Constitutional:      General: He is not in acute distress.    Appearance: He is not toxic-appearing or diaphoretic.  HENT:     Nose: Nose normal.     Mouth/Throat:     Mouth: Mucous membranes are moist.  Eyes:     General: No scleral icterus.    Conjunctiva/sclera: Conjunctivae normal.  Cardiovascular:     Rate and Rhythm: Normal rate and regular rhythm.     Heart sounds: No murmur heard.    No friction rub. No gallop.  Pulmonary:     Breath sounds: No stridor. No  wheezing, rhonchi or rales.  Abdominal:     General: Abdomen is flat.     Palpations: There is no mass.     Tenderness: There is no abdominal tenderness. There is no guarding.     Hernia: No hernia is present.  Musculoskeletal:        General: Normal range of motion.     Cervical back: Neck supple.     Right lower leg: No edema.     Left lower leg: No edema.  Lymphadenopathy:     Cervical: No cervical adenopathy.  Skin:    General: Skin is warm and dry.     Coloration: Skin is pale.     Findings: No rash.  Neurological:     General: No focal deficit present.     Mental Status: He is alert. Mental status is at baseline.  Psychiatric:        Mood and Affect: Mood normal.        Behavior: Behavior normal.     Lab Results  Component Value Date   WBC 14.9 (H) 06/01/2023   HGB 12.9 (L) 06/01/2023   HCT 38.9 (L) 06/01/2023   PLT 292.0 06/01/2023   GLUCOSE 151 (H) 06/01/2023   CHOL 132 02/22/2023   TRIG 105.0 02/22/2023   HDL 53.10 02/22/2023   LDLDIRECT 149.3 02/12/2013   LDLCALC 58 02/22/2023   ALT 29 05/24/2023   AST 20 05/24/2023   NA 138 06/01/2023   K 4.6 06/01/2023   CL 99 06/01/2023   CREATININE 1.02 06/01/2023   BUN 18 06/01/2023   CO2 31 06/01/2023   TSH 0.582 04/07/2023   PSA 0.39 03/18/2023   INR 1.6 (H) 05/15/2023   HGBA1C 6.5 06/01/2023   MICROALBUR 0.7 02/22/2023    CT Angio Chest PE W/Cm &/Or Wo Cm  Result Date: 05/15/2023 CLINICAL DATA:  Suspected pulmonary embolism. EXAM: CT ANGIOGRAPHY CHEST WITH CONTRAST TECHNIQUE: Multidetector CT imaging of the chest was performed using the standard protocol during bolus administration of intravenous contrast. Multiplanar CT image reconstructions and MIPs were obtained to evaluate the vascular anatomy. RADIATION DOSE REDUCTION: This exam was performed according to the departmental dose-optimization program which includes automated exposure control, adjustment of the mA and/or kV according to patient size and/or  use of iterative reconstruction technique. CONTRAST:  75mL OMNIPAQUE IOHEXOL 350 MG/ML SOLN COMPARISON:  May 02, 2023 FINDINGS: Cardiovascular: A right-sided venous Port-A-Cath is in place. There is mild calcification of the aortic arch, without evidence of an aortic aneurysm. Satisfactory opacification of the pulmonary arteries to the segmental level. No evidence of pulmonary embolism. Normal heart size. No pericardial effusion. Mediastinum/Nodes: No enlarged mediastinal, hilar, or axillary lymph nodes. Thyroid gland, trachea, and esophagus demonstrate no significant findings. Lungs/Pleura: Lungs are clear. No pleural effusion or pneumothorax. Upper Abdomen: The left kidney is surgically absent. Musculoskeletal: No chest wall abnormality. No acute or significant osseous findings. Review of the MIP images confirms the above findings. IMPRESSION: 1. No evidence of pulmonary embolism or acute cardiopulmonary disease. 2. Evidence of prior left nephrectomy. 3. Aortic atherosclerosis. Aortic Atherosclerosis (ICD10-I70.0). Electronically Signed   By: Aram Candela M.D.   On: 05/15/2023 18:16   US ABDOMEN LIMITED RUQ (LIVER/GB)  Result Date: 05/15/2023 CLINICAL DATA:  5 day history of right upper quadrant pain. Prior left nephrectomy. EXAM: ULTRASOUND ABDOMEN LIMITED RIGHT UPPER QUADRANT COMPARISON:  05/13/2023 FINDINGS: Gallbladder: Layering sludge identified in the gallbladder with a 6 mm shadowing stone evident. Gallbladder wall thickness upper normal to 3 mm. No sonographic Murphy sign. Common bile duct: Diameter: 2-3 mm Liver: Left hepatic lobe is obscured by bowel gas. Liver otherwise unremarkable. Portal vein is patent on color Doppler imaging with normal direction of blood flow towards the liver. Other: None. IMPRESSION: 1. Layering sludge and 6 mm shadowing stone in the gallbladder. Gallbladder wall thickness upper normal to 3 mm. No sonographic Murphy sign. 2. Left hepatic lobe is obscured by bowel  gas. Electronically Signed   By: Kennith Center M.D.   On: 05/15/2023 13:38   DG Chest Port 1 View  Result Date: 05/15/2023 CLINICAL DATA:  Questionable sepsis. History of renal cell carcinoma. EXAM: PORTABLE CHEST 1 VIEW COMPARISON:  12/22/2022 FINDINGS: Right chest wall port a catheter is identified with tip in the projection of the superior cavoatrial junction. Heart size and mediastinal contours are unremarkable. No pleural  fluid or interstitial edema. No airspace opacities identified. Visualized osseous structures are unremarkable. IMPRESSION: No acute cardiopulmonary abnormalities. Electronically Signed   By: Signa Kell M.D.   On: 05/15/2023 11:04    Assessment & Plan:   Other iron deficiency anemia- Will start iron. -     IBC + Ferritin; Future -     CBC with Differential/Platelet; Future -     ACCRUFeR; Take 1 capsule (30 mg total) by mouth in the morning and at bedtime.  Dispense: 180 capsule; Refill: 0  Controlled type 2 diabetes mellitus without complication, without long-term current use of insulin (HCC)- His blood sugar is well controlled. -     Fructosamine; Future -     Hemoglobin A1c; Future -     Basic metabolic panel; Future -     Urinalysis, Routine w reflex microscopic; Future  Hypertension associated with diabetes (HCC)- His BP is well controlled. -     Basic metabolic panel; Future -     Urinalysis, Routine w reflex microscopic; Future     Follow-up: Return in about 3 months (around 09/01/2023).  Sanda Linger, MD

## 2023-06-01 NOTE — Patient Instructions (Signed)
Iron Deficiency Anemia, Adult  Iron deficiency anemia is a condition in which the concentration of red blood cells or hemoglobin in the blood is below normal because of too little iron. Hemoglobin is a substance in red blood cells that carries oxygen to the body's tissues. When the concentration of red blood cells or hemoglobin is too low, not enough oxygen reaches these tissues. Iron deficiency anemia is usually long-lasting, and it develops over time. It may or may not cause symptoms. It is a common type of anemia. What are the causes? This condition may be caused by: Not enough iron in the diet. Abnormal absorption in the gut. Blood loss. What increases the risk? You are more likely to develop this condition if you get menstrual periods (menstruate) or are pregnant. What are the signs or symptoms? Symptoms of this condition may include: Pale skin, lips, and nail beds. Weakness, dizziness, and getting tired easily. Shortness of breath when moving or exercising. Cold hands or feet. Mild anemia may not cause any symptoms. How is this diagnosed? This condition is diagnosed based on: Your medical history. A physical exam. Blood tests. How is this treated? This condition is treated by correcting the cause of your iron deficiency. Treatment may involve: Adding iron-rich foods to your diet. Taking iron supplements. If you are pregnant or breastfeeding, you may need to take extra iron because your normal diet usually does not provide the amount of iron that you need. Increasing vitamin C intake. Vitamin C helps your body absorb iron. Your health care provider may recommend that you take iron supplements along with a glass of orange juice or a vitamin C supplement. Medicines to make heavy menstrual flow lighter. Surgery or additional testing procedures to determine the cause of your anemia. You may need repeat blood tests to determine whether treatment is working. If the treatment does not  seem to be working, you may need more tests. Follow these instructions at home: Medicines Take over-the-counter and prescription medicines only as told by your health care provider. This includes iron supplements and vitamins. This is important because too much iron can be harmful. For the best iron absorption, you should take iron supplements when your stomach is empty. If you cannot tolerate them on an empty stomach, you may need to take them with food. Do not drink milk or take antacids at the same time as your iron supplements. Milk and antacids may interfere with how your body absorbs iron. Iron supplements may turn stool (feces) a darker color and it may appear black. If you cannot tolerate taking iron supplements by mouth, talk with your health care provider about taking them through an IV or through an injection into a muscle. Eating and drinking Talk with your health care provider before changing your diet. Your provider may recommend that you eat foods that contain a lot of iron, such as: Liver. Low-fat (lean) beef. Breads and cereals that have iron added to them (are fortified). Eggs. Dried fruit. Dark green, leafy vegetables. To help your body use the iron from iron-rich foods, eat those foods at the same time as fresh fruits and vegetables that are high in vitamin C. Foods that are high in vitamin C include: Oranges. Peppers. Tomatoes. Mangoes. Managing constipation If you are taking an iron supplement, it may cause constipation. To prevent or treat constipation, you may need to: Drink enough fluid to keep your urine pale yellow. Take over-the-counter or prescription medicines. Eat foods that are high in fiber, such  as beans, whole grains, and fresh fruits and vegetables. Limit foods that are high in fat and processed sugars, such as fried or sweet foods. General instructions Return to your normal activities as told by your health care provider. Ask your health care provider  what activities are safe for you. Keep all follow-up visits. Contact a health care provider if: You feel nauseous or you vomit. You feel weak. You become light-headed when getting up from a sitting or lying down position. You have unexplained sweating. You develop symptoms of constipation. You have a heaviness in your chest. You have trouble breathing with physical activity. Get help right away if: You faint. If this happens, do not drive yourself to the hospital. You have an irregular or rapid heartbeat. Summary Iron deficiency anemia is a condition in which the concentration of red blood cells or hemoglobin in the blood is below normal because of too little iron. This condition is treated by correcting the cause of your iron deficiency. Take over-the-counter and prescription medicines only as told by your health care provider. This includes iron supplements and vitamins. To help your body use the iron from iron-rich foods, eat those foods at the same time as fresh fruits and vegetables that are high in vitamin C. Seek medical help if you have signs or symptoms of worsening anemia. This information is not intended to replace advice given to you by your health care provider. Make sure you discuss any questions you have with your health care provider. Document Revised: 07/29/2021 Document Reviewed: 07/29/2021 Elsevier Patient Education  2024 [employer].

## 2023-06-03 ENCOUNTER — Encounter: Payer: Self-pay | Admitting: Internal Medicine

## 2023-06-06 ENCOUNTER — Other Ambulatory Visit: Payer: Self-pay

## 2023-06-06 MED ORDER — ZOLPIDEM TARTRATE 10 MG PO TABS: 10.0000 mg | ORAL_TABLET | Freq: Every evening | ORAL | 0 refills | Status: DC | PRN

## 2023-06-07 ENCOUNTER — Inpatient Hospital Stay: Payer: Federal, State, Local not specified - PPO | Attending: Internal Medicine

## 2023-06-07 ENCOUNTER — Inpatient Hospital Stay (HOSPITAL_BASED_OUTPATIENT_CLINIC_OR_DEPARTMENT_OTHER): Payer: Federal, State, Local not specified - PPO | Admitting: Internal Medicine

## 2023-06-07 VITALS — BP 112/79 | HR 105 | Temp 98.6°F | Wt 139.0 lb

## 2023-06-07 DIAGNOSIS — I2699 Other pulmonary embolism without acute cor pulmonale: Secondary | ICD-10-CM | POA: Diagnosis not present

## 2023-06-07 DIAGNOSIS — C642 Malignant neoplasm of left kidney, except renal pelvis: Secondary | ICD-10-CM

## 2023-06-07 DIAGNOSIS — D509 Iron deficiency anemia, unspecified: Secondary | ICD-10-CM | POA: Insufficient documentation

## 2023-06-07 DIAGNOSIS — H15103 Unspecified episcleritis, bilateral: Secondary | ICD-10-CM

## 2023-06-07 DIAGNOSIS — K521 Toxic gastroenteritis and colitis: Secondary | ICD-10-CM

## 2023-06-07 DIAGNOSIS — C7951 Secondary malignant neoplasm of bone: Secondary | ICD-10-CM | POA: Diagnosis not present

## 2023-06-07 DIAGNOSIS — Z7901 Long term (current) use of anticoagulants: Secondary | ICD-10-CM | POA: Diagnosis not present

## 2023-06-07 DIAGNOSIS — R911 Solitary pulmonary nodule: Secondary | ICD-10-CM | POA: Insufficient documentation

## 2023-06-07 LAB — CMP (CANCER CENTER ONLY)
ALT: 19 U/L (ref 0–44)
AST: 18 U/L (ref 15–41)
Albumin: 3.6 g/dL (ref 3.5–5.0)
Alkaline Phosphatase: 64 U/L (ref 38–126)
Anion gap: 13 (ref 5–15)
BUN: 19 mg/dL (ref 6–20)
CO2: 25 mmol/L (ref 22–32)
Calcium: 9.4 mg/dL (ref 8.9–10.3)
Chloride: 95 mmol/L — ABNORMAL LOW (ref 98–111)
Creatinine: 1.14 mg/dL (ref 0.61–1.24)
GFR, Estimated: >60 mL/min (ref >60)
Glucose, Bld: 180 mg/dL — ABNORMAL HIGH (ref 70–99)
Potassium: 4.5 mmol/L (ref 3.5–5.1)
Sodium: 133 mmol/L — ABNORMAL LOW (ref 135–145)
Total Bilirubin: 1.5 mg/dL — ABNORMAL HIGH (ref <1.2)
Total Protein: 7.1 g/dL (ref 6.5–8.1)

## 2023-06-07 LAB — CBC WITH DIFFERENTIAL (CANCER CENTER ONLY)
Abs Immature Granulocytes: 0.05 10*3/uL (ref 0.00–0.07)
Basophils Absolute: 0 10*3/uL (ref 0.0–0.1)
Basophils Relative: 0 %
Eosinophils Absolute: 0 10*3/uL (ref 0.0–0.5)
Eosinophils Relative: 1 %
HCT: 42.7 % (ref 39.0–52.0)
Hemoglobin: 13.8 g/dL (ref 13.0–17.0)
Immature Granulocytes: 1 %
Lymphocytes Relative: 8 %
Lymphs Abs: 0.3 10*3/uL — ABNORMAL LOW (ref 0.7–4.0)
MCH: 29.6 pg (ref 26.0–34.0)
MCHC: 32.3 g/dL (ref 30.0–36.0)
MCV: 91.4 fL (ref 80.0–100.0)
Monocytes Absolute: 0.6 10*3/uL (ref 0.1–1.0)
Monocytes Relative: 15 %
Neutro Abs: 3.1 10*3/uL (ref 1.7–7.7)
Neutrophils Relative %: 75 %
Platelet Count: 200 10*3/uL (ref 150–400)
RBC: 4.67 MIL/uL (ref 4.22–5.81)
RDW: 14.4 % (ref 11.5–15.5)
Smear Review: NORMAL
WBC Count: 4.1 10*3/uL (ref 4.0–10.5)
nRBC: 0 % (ref 0.0–0.2)

## 2023-06-07 LAB — FRUCTOSAMINE: Fructosamine: 246 umol/L (ref 205–285)

## 2023-06-07 MED ORDER — PREDNISONE 10 MG PO TABS: ORAL_TABLET | ORAL | 0 refills | Status: AC

## 2023-06-07 NOTE — Progress Notes (Signed)
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 series 4, raise the possibility of mild enteritis. Vascular/Lymphatic: As noted in the urinary tract section above, a left periaortic 1.5 cm in short axis structure could represent postoperative fluid collection or a low-density retroperitoneal lymph node. There is increased prominence of left upper quadrant mesenteric lymph nodes compared to 05/02/2023, including a 1.0 cm mesenteric node on image 31 series 2 which previously measured 0.6 cm. These are likely reactive. 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 The small amount of filling defect is only about 10 by 6 mm in size on image 25 series 2. It  would be unusual although not impossible for bland thrombus to continue to persist in this manner. Reproductive: Unremarkable Other: No supplemental non-categorized findings. Musculoskeletal: Mild spurring of the right femoral head. IMPRESSION: 1. Mild wall thickening in a couple of loops of small bowel in the left abdomen with reactive mesenteric lymph nodes, favoring enteritis. Air fluid levels in the distal colon compatible with diarrheal process. No colon wall thickening. 2. 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 3. 1.5 cm in short axis lymph node versus chronic postoperative fluid collection in the left periaortic region. Favor postoperative fluid collection but surveillance is recommended. 4. Gallstone along the gallbladder fundus along a presumed Phrygian cap. 5. Nonobstructive 5 mm right kidney lower pole calculus. 6. Mild spurring of the right femoral head. Electronically Signed   By: Gaylyn Rong M.D.   On: 05/13/2023 16:26   - late Wed - yesterday 2 episodes. Overnight fine.

## 2023-06-07 NOTE — Progress Notes (Signed)
Patient is able to get up and move around more than he has been. He is having a light cough recently. His skin has been really dry and cracking to were it is painful to the touch, he has been using over the counter lotion but it doesn't seem to be helping. He needs a refill on his Zofran, because he is having to take it just about every day.

## 2023-06-08 ENCOUNTER — Other Ambulatory Visit: Payer: Self-pay | Admitting: Internal Medicine

## 2023-06-08 ENCOUNTER — Other Ambulatory Visit: Payer: Self-pay | Admitting: Student in an Organized Health Care Education/Training Program

## 2023-06-08 DIAGNOSIS — R053 Chronic cough: Secondary | ICD-10-CM

## 2023-06-10 ENCOUNTER — Telehealth: Payer: Self-pay

## 2023-06-10 ENCOUNTER — Other Ambulatory Visit: Payer: Self-pay

## 2023-06-10 DIAGNOSIS — C642 Malignant neoplasm of left kidney, except renal pelvis: Secondary | ICD-10-CM

## 2023-06-10 NOTE — Telephone Encounter (Signed)
Put in patient referral for Nutrition with [facility] today.

## 2023-06-13 ENCOUNTER — Encounter: Payer: Self-pay | Admitting: Internal Medicine

## 2023-06-13 ENCOUNTER — Telehealth: Payer: Self-pay | Admitting: Internal Medicine

## 2023-06-13 MED ORDER — APIXABAN 5 MG PO TABS: 5.0000 mg | ORAL_TABLET | Freq: Two times a day (BID) | ORAL | 3 refills | Status: DC

## 2023-06-13 NOTE — Telephone Encounter (Addendum)
He is on a steroid taper.  Weaned from prednisone 40 mg to 30 mg on 06/11/2023.  Had worsening nocturnal diarrhea with 4 episodes Saturday night.  Then 3 episodes Sunday night.  He is taking Lomotil 3 to 4 pills as needed.  I have advised him to stop Metamucil.  Advised him to go up on prednisone to 40 mg for a week until December 16.  Will try to taper him to 30 mg on December 17.  If diarrhea recurs on steroid tapering, may need biologics. I have informed the patient to keep me updated.

## 2023-06-13 NOTE — Addendum Note (Signed)
Addended byMichaelyn Barter on: 06/13/2023 05:07 PM   Modules accepted: Orders

## 2023-06-14 ENCOUNTER — Other Ambulatory Visit: Payer: Self-pay | Admitting: Internal Medicine

## 2023-06-14 ENCOUNTER — Telehealth: Payer: Self-pay

## 2023-06-14 MED ORDER — SULFAMETHOXAZOLE-TRIMETHOPRIM 800-160 MG PO TABS: 1.0000 | ORAL_TABLET | ORAL | 0 refills | Status: AC

## 2023-06-14 NOTE — Telephone Encounter (Signed)
Called to inform patient about the Bactrim medication Dr. Alena Bills sent over to his pharmacy. I told him that Dr. Mervyn Skeeters was going to extend the steroid medication and the Bactrim medication will help to prevent infection. Patient understood and agreed with this plan.

## 2023-06-15 ENCOUNTER — Telehealth: Payer: Self-pay | Admitting: *Deleted

## 2023-06-15 NOTE — Telephone Encounter (Signed)
Patient called reporting that pharmacist said the prescription sent will interfere with his potassium and losartan and is asking if he should take it. Please return his call to discuss

## 2023-06-15 NOTE — Telephone Encounter (Signed)
Patient states that he is needing more Prednisone as he only has enough for 2 more days

## 2023-06-16 ENCOUNTER — Encounter: Payer: Self-pay | Admitting: Internal Medicine

## 2023-06-16 MED ORDER — PREDNISONE 10 MG PO TABS: ORAL_TABLET | ORAL | 0 refills | Status: AC

## 2023-06-17 ENCOUNTER — Inpatient Hospital Stay: Payer: Federal, State, Local not specified - PPO

## 2023-06-17 ENCOUNTER — Other Ambulatory Visit: Payer: Self-pay

## 2023-06-17 NOTE — Progress Notes (Signed)
Nutrition Assessment   Reason for Assessment:   Weight loss   ASSESSMENT:  52 year old male with stage IV, left clear cell RCC.  S/p radical nephrectomy.  Past medical history of HTN, GERD, iron deficiency anemia.  Started on immunotherapy but currently on hold as had issues with colitis (hospitalization).    Met with patient in clinic.  Reports that appetite has improved from hospital visit in November.  Appetite during admission was poor and with increased diarrhea had fear of eating.  Reports that diarrhea is undercontrol.  Taking prednisone at this time.  Reports for breakfast usually has cereal, or oatmeal or bagel with juice. Sometimes yogurt or applesauce.  Ate a doughnut after breakfast this am.  Lunch yesterday was brunswick stew and some cheetos.  Supper last night was ham, macaroni and cheese and stuffing.  Has been drinking ensure plus and premier protein shake.  Usually 1 shake a day.      Nutrition Focused Physical Exam:   Orbital Region: mild Buccal Region: mild Upper Arm Region: moderate Thoracic and Lumbar Region: mild Temple Region: mild Clavicle Bone Region: mild Shoulder and Acromion Bone Region: mild Scapular Bone Region: mild Dorsal Hand: mild Patellar Region: moderate Anterior Thigh Region: moderate Posterior Calf Region: mild Edema (RD assessment): none Hair: observed Eyes: observed Mouth: wearing mask Skin: dry, using lotion Nails: observed   Medications: prednisone, prilosec, MMW   Labs: glucose 180   Anthropometrics:   Height: 70 inches Weight: 138 lb 5 oz today UBW: 181 lb in Jan 2024 BMI: 19  24% weight loss in the last 11 months, significant   Estimated Energy Needs  Kcals: 1800-2200 Protein: 76-94 g Fluid:  > 1.8 L   NUTRITION DIAGNOSIS: Inadequate oral intake related to cancer related treatment side effects (colitis, diarrhea) as evidenced by 24% weight loss in the last 11 months, mild fat loss, moderate muscle mass  loss   MALNUTRITION DIAGNOSIS: Patient meets criteria of moderate malnutrition in context of chronic illness as evidenced by 24% weight loss in 11 months, mild fat loss and moderate muscle mass loss.     INTERVENTION:  Recommend 350+ calorie shake 1-2 between meals.  Samples of ensure complete and boost [facility] provided along with coupons. Discussed ways to add calories and protein. Handout provided Cautioned patient that increasing fat can sometimes effect diarrhea. He will monitor Contact information provided   MONITORING, EVALUATION, GOAL: weight trends, intake   Next Visit: Jan 17 (Friday) in clinic  Nygeria Lager B. Freida Busman, RD, LDN Registered Dietitian [phone]

## 2023-06-25 ENCOUNTER — Other Ambulatory Visit: Payer: Self-pay | Admitting: Internal Medicine

## 2023-06-25 DIAGNOSIS — I519 Heart disease, unspecified: Secondary | ICD-10-CM

## 2023-06-25 DIAGNOSIS — I152 Hypertension secondary to endocrine disorders: Secondary | ICD-10-CM

## 2023-06-28 ENCOUNTER — Encounter: Payer: Self-pay | Admitting: Emergency Medicine

## 2023-06-28 ENCOUNTER — Ambulatory Visit: Payer: Federal, State, Local not specified - PPO | Attending: Cardiovascular Disease | Admitting: Cardiovascular Disease

## 2023-06-28 ENCOUNTER — Encounter: Payer: Self-pay | Admitting: Cardiovascular Disease

## 2023-06-28 VITALS — BP 92/64 | HR 95 | Ht 70.0 in | Wt 141.4 lb

## 2023-06-28 DIAGNOSIS — I2694 Multiple subsegmental pulmonary emboli without acute cor pulmonale: Secondary | ICD-10-CM | POA: Diagnosis not present

## 2023-06-28 DIAGNOSIS — I5042 Chronic combined systolic (congestive) and diastolic (congestive) heart failure: Secondary | ICD-10-CM

## 2023-06-28 DIAGNOSIS — C642 Malignant neoplasm of left kidney, except renal pelvis: Secondary | ICD-10-CM | POA: Diagnosis not present

## 2023-06-28 DIAGNOSIS — C7951 Secondary malignant neoplasm of bone: Secondary | ICD-10-CM | POA: Diagnosis not present

## 2023-06-28 DIAGNOSIS — E7800 Pure hypercholesterolemia, unspecified: Secondary | ICD-10-CM

## 2023-06-28 DIAGNOSIS — E119 Type 2 diabetes mellitus without complications: Secondary | ICD-10-CM

## 2023-06-28 MED ORDER — METOPROLOL SUCCINATE 25 MG PO CS24: 25.0000 mg | EXTENDED_RELEASE_CAPSULE | Freq: Every day | ORAL | 3 refills | Status: DC
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
## 2023-06-28 NOTE — Patient Instructions (Addendum)
Medication Instructions:  STOP LOSARTAN AND METOPROLOL TARTRATE  START METOPROLOL SUCCINATE 25 MG DAILY *If you need a refill on your cardiac medications before your next appointment, please call your pharmacy*  Follow-Up: At [facility], you and your health needs are our priority.  As part of our continuing mission to provide you with exceptional heart care, we have created designated Provider Care Teams.  These Care Teams include your primary Cardiologist (physician) and Advanced Practice Providers (APPs -  Physician Assistants and Nurse Practitioners) who all work together to provide you with the care you need, when you need it.  We recommend signing up for the patient portal called "MyChart".  Sign up information is provided on this After Visit Summary.  MyChart is used to connect with patients for Virtual Visits (Telemedicine).  Patients are able to view lab/test results, encounter notes, upcoming appointments, etc.  Non-urgent messages can be sent to your provider as well.   To learn more about what you can do with MyChart, go to [email or web address].    Your next appointment:   6 month(s)  Provider:   Thurmon Fair, MD

## 2023-06-28 NOTE — Progress Notes (Signed)
Called the patient to add medication instructions: Stop Losartan and Metoprolol Tartrate Start Metoprolol Succinate 25 mg daily. [facility] [place] He verbalized understanding. Instructions also sent through a MyChart message- patient aware.

## 2023-06-29 ENCOUNTER — Other Ambulatory Visit: Payer: Self-pay

## 2023-07-01 ENCOUNTER — Telehealth: Payer: Self-pay

## 2023-07-01 NOTE — Telephone Encounter (Signed)
Called and spoke with patient. [facility] states that [facility] [place] [facility] informed him that they will not fill his new Metoprolol prescription that Dr Royann Shivers prescribed on 12/24 until they have approval from his PCP Dr Sanda Linger.  [facility] and was informed that they filled the scipt and it is ready for pick up and not sure why the patient had been told about the prior approval issue. Called patient back and left VMM that script was ready for pickup.

## 2023-07-03 ENCOUNTER — Encounter: Payer: Self-pay | Admitting: Cardiovascular Disease

## 2023-07-03 ENCOUNTER — Encounter: Payer: Self-pay | Admitting: Internal Medicine

## 2023-07-04 ENCOUNTER — Other Ambulatory Visit: Payer: Self-pay

## 2023-07-04 ENCOUNTER — Other Ambulatory Visit: Payer: Self-pay | Admitting: Internal Medicine

## 2023-07-04 ENCOUNTER — Inpatient Hospital Stay: Payer: Federal, State, Local not specified - PPO

## 2023-07-04 ENCOUNTER — Encounter: Payer: Self-pay | Admitting: Internal Medicine

## 2023-07-04 ENCOUNTER — Inpatient Hospital Stay (HOSPITAL_BASED_OUTPATIENT_CLINIC_OR_DEPARTMENT_OTHER): Payer: Federal, State, Local not specified - PPO | Admitting: Internal Medicine

## 2023-07-04 VITALS — BP 104/84 | HR 112 | Temp 97.8°F | Ht 70.0 in | Wt 140.8 lb

## 2023-07-04 DIAGNOSIS — C642 Malignant neoplasm of left kidney, except renal pelvis: Secondary | ICD-10-CM

## 2023-07-04 DIAGNOSIS — C7951 Secondary malignant neoplasm of bone: Secondary | ICD-10-CM | POA: Diagnosis not present

## 2023-07-04 DIAGNOSIS — Z7901 Long term (current) use of anticoagulants: Secondary | ICD-10-CM | POA: Diagnosis not present

## 2023-07-04 DIAGNOSIS — K521 Toxic gastroenteritis and colitis: Secondary | ICD-10-CM

## 2023-07-04 DIAGNOSIS — I2699 Other pulmonary embolism without acute cor pulmonale: Secondary | ICD-10-CM | POA: Diagnosis not present

## 2023-07-04 DIAGNOSIS — R911 Solitary pulmonary nodule: Secondary | ICD-10-CM | POA: Diagnosis not present

## 2023-07-04 DIAGNOSIS — D509 Iron deficiency anemia, unspecified: Secondary | ICD-10-CM | POA: Diagnosis not present

## 2023-07-04 LAB — CBC WITH DIFFERENTIAL (CANCER CENTER ONLY)
Abs Immature Granulocytes: 0.15 10*3/uL — ABNORMAL HIGH (ref 0.00–0.07)
Basophils Absolute: 0 10*3/uL (ref 0.0–0.1)
Basophils Relative: 0 %
Eosinophils Absolute: 0 10*3/uL (ref 0.0–0.5)
Eosinophils Relative: 0 %
HCT: 45.2 % (ref 39.0–52.0)
Hemoglobin: 15 g/dL (ref 13.0–17.0)
Immature Granulocytes: 2 %
Lymphocytes Relative: 8 %
Lymphs Abs: 0.7 10*3/uL (ref 0.7–4.0)
MCH: 30.9 pg (ref 26.0–34.0)
MCHC: 33.2 g/dL (ref 30.0–36.0)
MCV: 93 fL (ref 80.0–100.0)
Monocytes Absolute: 0.5 10*3/uL (ref 0.1–1.0)
Monocytes Relative: 6 %
Neutro Abs: 7 10*3/uL (ref 1.7–7.7)
Neutrophils Relative %: 84 %
Platelet Count: 186 10*3/uL (ref 150–400)
RBC: 4.86 MIL/uL (ref 4.22–5.81)
RDW: 14.2 % (ref 11.5–15.5)
WBC Count: 8.3 10*3/uL (ref 4.0–10.5)
nRBC: 0 % (ref 0.0–0.2)

## 2023-07-04 LAB — CMP (CANCER CENTER ONLY)
ALT: 30 U/L (ref 0–44)
AST: 15 U/L (ref 15–41)
Albumin: 4 g/dL (ref 3.5–5.0)
Alkaline Phosphatase: 78 U/L (ref 38–126)
Anion gap: 12 (ref 5–15)
BUN: 22 mg/dL — ABNORMAL HIGH (ref 6–20)
CO2: 24 mmol/L (ref 22–32)
Calcium: 9.3 mg/dL (ref 8.9–10.3)
Chloride: 97 mmol/L — ABNORMAL LOW (ref 98–111)
Creatinine: 1.17 mg/dL (ref 0.61–1.24)
GFR, Estimated: >60 mL/min (ref >60)
Glucose, Bld: 393 mg/dL — ABNORMAL HIGH (ref 70–99)
Potassium: 5.1 mmol/L (ref 3.5–5.1)
Sodium: 133 mmol/L — ABNORMAL LOW (ref 135–145)
Total Bilirubin: 1.3 mg/dL — ABNORMAL HIGH (ref 0.0–1.2)
Total Protein: 6.9 g/dL (ref 6.5–8.1)

## 2023-07-04 LAB — URINALYSIS, COMPLETE (UACMP) WITH MICROSCOPIC
Bacteria, UA: NONE SEEN
Bilirubin Urine: NEGATIVE
Glucose, UA: >=500 mg/dL — AB
Hgb urine dipstick: NEGATIVE
Ketones, ur: NEGATIVE mg/dL
Leukocytes,Ua: NEGATIVE
Nitrite: NEGATIVE
Protein, ur: NEGATIVE mg/dL
Specific Gravity, Urine: 1.033 — ABNORMAL HIGH (ref 1.005–1.030)
Squamous Epithelial / HPF: 0 /[HPF] (ref 0–5)
pH: 6 (ref 5.0–8.0)

## 2023-07-04 LAB — TSH: TSH: 0.255 u[IU]/mL — ABNORMAL LOW (ref 0.350–4.500)

## 2023-07-04 MED ORDER — STERILE WATER FOR INJECTION IJ SOLN: 5.0000 mL | Freq: Four times a day (QID) | OROMUCOSAL | 3 refills | Status: DC

## 2023-07-04 NOTE — Progress Notes (Unsigned)
C/o tongue feeling like it's on fire. Has magic mouthwash, only helps like 20 minutes. Needs refill, pended. Has trouble chewing and swallowing.  Pt states BP meds were switched, sometimes feels dizzy, advised to contact cardiologist.  Skin looks different on legs. Would like you to take a look at it.

## 2023-07-06 LAB — URINE CULTURE: Culture: NO GROWTH

## 2023-07-07 DIAGNOSIS — H209 Unspecified iridocyclitis: Secondary | ICD-10-CM | POA: Diagnosis not present

## 2023-07-07 DIAGNOSIS — H15103 Unspecified episcleritis, bilateral: Secondary | ICD-10-CM | POA: Diagnosis not present

## 2023-07-08 ENCOUNTER — Encounter: Payer: Self-pay | Admitting: *Deleted

## 2023-07-11 DIAGNOSIS — C7951 Secondary malignant neoplasm of bone: Secondary | ICD-10-CM | POA: Diagnosis not present

## 2023-07-11 DIAGNOSIS — C649 Malignant neoplasm of unspecified kidney, except renal pelvis: Secondary | ICD-10-CM | POA: Diagnosis not present

## 2023-07-12 ENCOUNTER — Other Ambulatory Visit: Payer: Self-pay | Admitting: Internal Medicine

## 2023-07-12 DIAGNOSIS — R7989 Other specified abnormal findings of blood chemistry: Secondary | ICD-10-CM

## 2023-07-13 ENCOUNTER — Inpatient Hospital Stay: Payer: Federal, State, Local not specified - PPO | Attending: Internal Medicine

## 2023-07-13 ENCOUNTER — Other Ambulatory Visit: Payer: Self-pay | Admitting: Internal Medicine

## 2023-07-13 ENCOUNTER — Other Ambulatory Visit: Payer: Self-pay

## 2023-07-13 ENCOUNTER — Telehealth: Payer: Self-pay

## 2023-07-13 DIAGNOSIS — C7951 Secondary malignant neoplasm of bone: Secondary | ICD-10-CM | POA: Insufficient documentation

## 2023-07-13 DIAGNOSIS — Z7901 Long term (current) use of anticoagulants: Secondary | ICD-10-CM | POA: Insufficient documentation

## 2023-07-13 DIAGNOSIS — C642 Malignant neoplasm of left kidney, except renal pelvis: Secondary | ICD-10-CM | POA: Insufficient documentation

## 2023-07-13 DIAGNOSIS — R7989 Other specified abnormal findings of blood chemistry: Secondary | ICD-10-CM

## 2023-07-13 DIAGNOSIS — E1165 Type 2 diabetes mellitus with hyperglycemia: Secondary | ICD-10-CM

## 2023-07-13 DIAGNOSIS — Z86711 Personal history of pulmonary embolism: Secondary | ICD-10-CM | POA: Diagnosis not present

## 2023-07-13 DIAGNOSIS — Z79620 Long term (current) use of immunosuppressive biologic: Secondary | ICD-10-CM | POA: Insufficient documentation

## 2023-07-13 MED ORDER — DAPAGLIFLOZIN PRO-METFORMIN ER 5-1000 MG PO TB24: 1.0000 | ORAL_TABLET | Freq: Every day | ORAL | 1 refills | Status: DC

## 2023-07-13 NOTE — Telephone Encounter (Signed)
 Xigduo refill request complete, patient made aware via phone

## 2023-07-14 ENCOUNTER — Encounter: Payer: Self-pay | Admitting: Internal Medicine

## 2023-07-14 LAB — THYROID PANEL WITH TSH
Free Thyroxine Index: 3.4 (ref 1.2–4.9)
T3 Uptake Ratio: 31 % (ref 24–39)
T4, Total: 11.1 ug/dL (ref 4.5–12.0)
TSH: 0.191 u[IU]/mL — ABNORMAL LOW (ref 0.450–4.500)

## 2023-07-18 ENCOUNTER — Encounter: Payer: Self-pay | Admitting: *Deleted

## 2023-07-18 ENCOUNTER — Encounter: Payer: Self-pay | Admitting: Internal Medicine

## 2023-07-18 ENCOUNTER — Other Ambulatory Visit: Payer: Self-pay | Admitting: Internal Medicine

## 2023-07-18 DIAGNOSIS — R946 Abnormal results of thyroid function studies: Secondary | ICD-10-CM

## 2023-07-19 ENCOUNTER — Inpatient Hospital Stay: Payer: Federal, State, Local not specified - PPO

## 2023-07-19 NOTE — Progress Notes (Signed)
 [facility] CSW Progress Note  Clinical Child Psychotherapist contacted patient by phone to discuss advance directives.  Informed patient of services offered to help patient complete documents.  He said he has copies at home, but has not filled them out yet.  Provided patient with contact information if he would like to meet in the future.    Macario CHRISTELLA Au, LCSW Clinical Social Worker [facility]

## 2023-07-22 ENCOUNTER — Inpatient Hospital Stay: Payer: Federal, State, Local not specified - PPO

## 2023-07-24 ENCOUNTER — Encounter: Payer: Self-pay | Admitting: Internal Medicine

## 2023-07-25 ENCOUNTER — Inpatient Hospital Stay: Payer: Federal, State, Local not specified - PPO

## 2023-07-25 ENCOUNTER — Inpatient Hospital Stay (HOSPITAL_BASED_OUTPATIENT_CLINIC_OR_DEPARTMENT_OTHER): Payer: Federal, State, Local not specified - PPO | Admitting: Hospice and Palliative Medicine

## 2023-07-25 ENCOUNTER — Encounter: Payer: Self-pay | Admitting: Hospice and Palliative Medicine

## 2023-07-25 ENCOUNTER — Ambulatory Visit
Admission: RE | Admit: 2023-07-25 | Discharge: 2023-07-25 | Disposition: A | Discharge status: Home / Self Care | Payer: Federal, State, Local not specified - PPO | Source: Ambulatory Visit | Attending: Hospice and Palliative Medicine | Admitting: Hospice and Palliative Medicine

## 2023-07-25 ENCOUNTER — Telehealth: Payer: Self-pay | Admitting: *Deleted

## 2023-07-25 VITALS — BP 106/73 | HR 93 | Temp 97.8°F | Wt 140.0 lb

## 2023-07-25 DIAGNOSIS — H209 Unspecified iridocyclitis: Secondary | ICD-10-CM | POA: Diagnosis not present

## 2023-07-25 DIAGNOSIS — C642 Malignant neoplasm of left kidney, except renal pelvis: Secondary | ICD-10-CM | POA: Diagnosis not present

## 2023-07-25 DIAGNOSIS — C7951 Secondary malignant neoplasm of bone: Secondary | ICD-10-CM | POA: Diagnosis not present

## 2023-07-25 DIAGNOSIS — H15103 Unspecified episcleritis, bilateral: Secondary | ICD-10-CM | POA: Diagnosis not present

## 2023-07-25 DIAGNOSIS — Z7901 Long term (current) use of anticoagulants: Secondary | ICD-10-CM | POA: Diagnosis not present

## 2023-07-25 DIAGNOSIS — Z905 Acquired absence of kidney: Secondary | ICD-10-CM | POA: Diagnosis not present

## 2023-07-25 DIAGNOSIS — N200 Calculus of kidney: Secondary | ICD-10-CM | POA: Diagnosis not present

## 2023-07-25 DIAGNOSIS — Z79620 Long term (current) use of immunosuppressive biologic: Secondary | ICD-10-CM | POA: Diagnosis not present

## 2023-07-25 DIAGNOSIS — Z86711 Personal history of pulmonary embolism: Secondary | ICD-10-CM | POA: Diagnosis not present

## 2023-07-25 DIAGNOSIS — R1011 Right upper quadrant pain: Secondary | ICD-10-CM | POA: Diagnosis not present

## 2023-07-25 LAB — CBC WITH DIFFERENTIAL (CANCER CENTER ONLY)
Abs Immature Granulocytes: 0.08 10*3/uL — ABNORMAL HIGH (ref 0.00–0.07)
Basophils Absolute: 0 10*3/uL (ref 0.0–0.1)
Basophils Relative: 1 %
Eosinophils Absolute: 0 10*3/uL (ref 0.0–0.5)
Eosinophils Relative: 1 %
HCT: 34.7 % — ABNORMAL LOW (ref 39.0–52.0)
Hemoglobin: 11.5 g/dL — ABNORMAL LOW (ref 13.0–17.0)
Immature Granulocytes: 1 %
Lymphocytes Relative: 14 %
Lymphs Abs: 1 10*3/uL (ref 0.7–4.0)
MCH: 31 pg (ref 26.0–34.0)
MCHC: 33.1 g/dL (ref 30.0–36.0)
MCV: 93.5 fL (ref 80.0–100.0)
Monocytes Absolute: 0.7 10*3/uL (ref 0.1–1.0)
Monocytes Relative: 9 %
Neutro Abs: 5.5 10*3/uL (ref 1.7–7.7)
Neutrophils Relative %: 74 %
Platelet Count: 296 10*3/uL (ref 150–400)
RBC: 3.71 MIL/uL — ABNORMAL LOW (ref 4.22–5.81)
RDW: 13.9 % (ref 11.5–15.5)
WBC Count: 7.4 10*3/uL (ref 4.0–10.5)
nRBC: 0 % (ref 0.0–0.2)

## 2023-07-25 LAB — CMP (CANCER CENTER ONLY)
ALT: 22 U/L (ref 0–44)
AST: 20 U/L (ref 15–41)
Albumin: 3.4 g/dL — ABNORMAL LOW (ref 3.5–5.0)
Alkaline Phosphatase: 71 U/L (ref 38–126)
Anion gap: 10 (ref 5–15)
BUN: 8 mg/dL (ref 6–20)
CO2: 26 mmol/L (ref 22–32)
Calcium: 8.6 mg/dL — ABNORMAL LOW (ref 8.9–10.3)
Chloride: 103 mmol/L (ref 98–111)
Creatinine: 0.9 mg/dL (ref 0.61–1.24)
GFR, Estimated: >60 mL/min (ref >60)
Glucose, Bld: 159 mg/dL — ABNORMAL HIGH (ref 70–99)
Potassium: 3.7 mmol/L (ref 3.5–5.1)
Sodium: 139 mmol/L (ref 135–145)
Total Bilirubin: 0.7 mg/dL (ref 0.0–1.2)
Total Protein: 6.7 g/dL (ref 6.5–8.1)

## 2023-07-25 MED ORDER — OXYCODONE-ACETAMINOPHEN 5-325 MG PO TABS: 1.0000 | ORAL_TABLET | ORAL | 0 refills | Status: DC | PRN

## 2023-07-25 MED ORDER — IOHEXOL 300 MG/ML  SOLN
80.0000 mL | Freq: Once | INTRAMUSCULAR | Status: AC | PRN
  Administered 2023-07-25: 80 mL via INTRAVENOUS

## 2023-07-25 NOTE — Telephone Encounter (Signed)
See phone note- pt to see josh today

## 2023-07-25 NOTE — Telephone Encounter (Signed)
Patient c/o RUQ abdominal pain. H/o "gallbladder sludge" seen on November CT scan. Patient c/o abd pain that wax/wanes for a about a week. Pt taking Oxycodone for pain, but the pain returns and the oxy seems to wear off too soon. I spoke with Dr. Mervyn Skeeters- who recommends [facility] apt and possibly moving up his ct scan that is planned for next week. (Need to r/o disease progression vs concerns with gallbladder). Per Sharia Reeve, NP- order cbc/metc today. See pt today in [facility]. Pt accepted apts for today. He can be here w/in 15 mins. He prefers to have labs drawn peripherally at this time as he has not had time to put cream on his port.
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
## 2023-07-26 ENCOUNTER — Telehealth: Payer: Self-pay

## 2023-07-26 ENCOUNTER — Inpatient Hospital Stay: Payer: Federal, State, Local not specified - PPO

## 2023-07-26 ENCOUNTER — Telehealth: Payer: Self-pay | Admitting: *Deleted

## 2023-07-26 DIAGNOSIS — K828 Other specified diseases of gallbladder: Secondary | ICD-10-CM

## 2023-07-26 DIAGNOSIS — R1011 Right upper quadrant pain: Secondary | ICD-10-CM

## 2023-07-26 NOTE — Telephone Encounter (Signed)
RN Spoke with Sharia Reeve, NP and Dr. Alena Bills. Recommendation to proceed with u/s of liver/gallbladder to further work up pt's RUQ pain. Recent CT of chest abd and pelvis did not show any evidence of disease progression. Patient contacted with provider recommendations. He would like to have another u/s liver/gb performed. He reports that his RUQ pain still comes and goes. I explained to him that we cnl the ct sch on Monday 1/27 as this is not needed. He just had the ct scan yesterday. Our scheduling team will reach out to schedule the u/s. Pt will keep all other apts the same.

## 2023-07-26 NOTE — Telephone Encounter (Signed)
Per scheduling u/s has been scheduled and ct cnl.

## 2023-07-26 NOTE — Telephone Encounter (Signed)
Clinical Social Work made follow up phone call after [employer] met with patient.  CSW attempted to contact patient by phone.  Left voicemail with contact information and request for return call.

## 2023-07-26 NOTE — Progress Notes (Signed)
Nutrition Follow-up:  Patient with stage IV, left clear cell RCC.  S/p radical nephrectomy and adrenalectomy.  Immunotherapy on hold due to colitis (hospitalization)  Met with patient in clinic.  Noted [facility] visit yesterday due to right upper quadrant pain that has been going on for the last week.  Patient reports appetite decreased yesterday due to abdominal pain.  Only able to eat yogurt last night for dinner.  Usually for breakfast has been eating yogurt or eggs, fruit, english muffin, sometimes oatmeal or breakfast bowl.  Lunch has been soup, salads, pasta, pizza. Dinner meat and vegetables.  Tried the higher calorie shakes but blood glucose was elevated so switched to glucerna.  Not drinking every day.  Reports that diarrhea is better but had episode last night, none this am.  Taking pain medication for right upper quadrant pain and improved this am.  Continues with dry mouth.      Medications: steriods have stopped  Labs: reviewed, elevated blood glucose likely steroids contributing  Anthropometrics:   Weight 140 lb on 1/20 138 lb 5 oz on 12/13  UBW of 181 lb on Jan 2024    NUTRITION DIAGNOSIS: Inadequate oral intake stable    INTERVENTION:  Encouraged glucerna at least daily for added calories and protein (lower in calories and sugar than ensure plus/boost plus).  Patient concerned about elevated blood glucose and prefers lower sugar shakes Discussed adding fat calories (heart healthy fats) to increase calories and promote weight gain.  Cautioned on days with abdominal pain/diarrhea decrease fat intake and chose bland foods.      MONITORING, EVALUATION, GOAL: weight trends, intake   NEXT VISIT: Tuesday, Feb 18 phone call  Anberlin Diez B. Freida Busman, RD, LDN Registered Dietitian [phone]

## 2023-07-29 ENCOUNTER — Ambulatory Visit
Admission: RE | Admit: 2023-07-29 | Discharge: 2023-07-29 | Disposition: A | Discharge status: Home / Self Care | Payer: Federal, State, Local not specified - PPO | Source: Ambulatory Visit | Attending: Internal Medicine | Admitting: Internal Medicine

## 2023-07-29 DIAGNOSIS — R1011 Right upper quadrant pain: Secondary | ICD-10-CM | POA: Insufficient documentation

## 2023-07-29 DIAGNOSIS — K828 Other specified diseases of gallbladder: Secondary | ICD-10-CM | POA: Diagnosis not present

## 2023-08-01 ENCOUNTER — Encounter: Payer: Self-pay | Admitting: Internal Medicine

## 2023-08-01 ENCOUNTER — Ambulatory Visit: Admission: RE | Admit: 2023-08-01 | Payer: Federal, State, Local not specified - PPO | Source: Ambulatory Visit

## 2023-08-01 DIAGNOSIS — R1011 Right upper quadrant pain: Secondary | ICD-10-CM

## 2023-08-01 DIAGNOSIS — K828 Other specified diseases of gallbladder: Secondary | ICD-10-CM

## 2023-08-01 DIAGNOSIS — C642 Malignant neoplasm of left kidney, except renal pelvis: Secondary | ICD-10-CM

## 2023-08-05 ENCOUNTER — Ambulatory Visit: Payer: Federal, State, Local not specified - PPO | Admitting: Surgery

## 2023-08-05 ENCOUNTER — Encounter: Payer: Self-pay | Admitting: Surgery

## 2023-08-05 VITALS — BP 116/77 | HR 83 | Temp 98.2°F | Ht 70.0 in | Wt 143.0 lb

## 2023-08-05 DIAGNOSIS — K8020 Calculus of gallbladder without cholecystitis without obstruction: Secondary | ICD-10-CM | POA: Diagnosis not present

## 2023-08-05 NOTE — Patient Instructions (Addendum)
You will need to stop your Eliquis 48 hours prior to surgery. You last dose will be on 08/08/23 for surgery on 08/11/23.  You have requested to have your gallbladder removed. This will be done at [facility] with Dr. Aleen Campi.  You will most likely be out of work 1-2 weeks for this surgery.  If you have FMLA or disability paperwork that needs filled out you may drop this off at our office or this can be faxed to [phone].  You will return after your post-op appointment with a lifting restriction for approximately 4 more weeks.  You will be able to eat anything you would like to following surgery. But, start by eating a bland diet and advance this as tolerated. The Gallbladder diet is below, please go as closely by this diet as possible prior to surgery to avoid any further attacks.  Please see the (blue)pre-care form that you have been given today. Our surgery scheduler will call you to verify surgery date and to go over information.   If you have any questions, please call our office.  Laparoscopic Cholecystectomy Laparoscopic cholecystectomy is surgery to remove the gallbladder. The gallbladder is located in the upper right part of the abdomen, behind the liver. It is a storage sac for bile, which is produced in the liver. Bile aids in the digestion and absorption of fats. Cholecystectomy is often done for inflammation of the gallbladder (cholecystitis). This condition is usually caused by a buildup of gallstones (cholelithiasis) in the gallbladder. Gallstones can block the flow of bile, and that can result in inflammation and pain. In severe cases, emergency surgery may be required. If emergency surgery is not required, you will have time to prepare for the procedure. Laparoscopic surgery is an alternative to open surgery. Laparoscopic surgery has a shorter recovery time. Your common bile duct may also need to be examined during the procedure. If stones are found in the common bile duct,  they may be removed. LET [facility] CARE PROVIDER KNOW ABOUT: Any allergies you have. All medicines you are taking, including vitamins, herbs, eye drops, creams, and over-the-counter medicines. Previous problems you or members of your family have had with the use of anesthetics. Any blood disorders you have. Previous surgeries you have had.  Any medical conditions you have. RISKS AND COMPLICATIONS Generally, this is a safe procedure. However, problems may occur, including: Infection. Bleeding. Allergic reactions to medicines. Damage to other structures or organs. A stone remaining in the common bile duct. A bile leak from the cyst duct that is clipped when your gallbladder is removed. The need to convert to open surgery, which requires a larger incision in the abdomen. This may be necessary if your surgeon thinks that it is not safe to continue with a laparoscopic procedure. BEFORE THE PROCEDURE Ask your health care provider about: Changing or stopping your regular medicines. This is especially important if you are taking diabetes medicines or blood thinners. Taking medicines such as aspirin and ibuprofen. These medicines can thin your blood. Do not take these medicines before your procedure if your health care provider instructs you not to. Follow instructions from your health care provider about eating or drinking restrictions. Let your health care provider know if you develop a cold or an infection before surgery. Plan to have someone take you home after the procedure. Ask your health care provider how your surgical site will be marked or identified. You may be given antibiotic medicine to help prevent infection. PROCEDURE To  reduce your risk of infection: Your health care team will wash or sanitize their hands. Your skin will be washed with soap. An IV tube may be inserted into one of your veins. You will be given a medicine to make you fall asleep (general anesthetic). A  breathing tube will be placed in your mouth. The surgeon will make several small cuts (incisions) in your abdomen. A thin, lighted tube (laparoscope) that has a tiny camera on the end will be inserted through one of the small incisions. The camera on the laparoscope will send a picture to a TV screen (monitor) in the operating room. This will give the surgeon a good view inside your abdomen. A gas will be pumped into your abdomen. This will expand your abdomen to give the surgeon more room to perform the surgery. Other tools that are needed for the procedure will be inserted through the other incisions. The gallbladder will be removed through one of the incisions. After your gallbladder has been removed, the incisions will be closed with stitches (sutures), staples, or skin glue. Your incisions may be covered with a bandage (dressing). The procedure may vary among health care providers and hospitals. AFTER THE PROCEDURE Your blood pressure, heart rate, breathing rate, and blood oxygen level will be monitored often until the medicines you were given have worn off. You will be given medicines as needed to control your pain.   This information is not intended to replace advice given to you by your health care provider. Make sure you discuss any questions you have with your health care provider.   Document Released: 06/21/2005 Document Revised: 03/12/2015 Document Reviewed: 01/31/2013 Elsevier Interactive Patient Education 2016 Else[employer] Inc.   Low-Fat Diet for Gallbladder Conditions A low-fat diet can be helpful if you have pancreatitis or a gallbladder condition. With these conditions, your pancreas and gallbladder have trouble digesting fats. A healthy eating plan with less fat will help rest your pancreas and gallbladder and reduce your symptoms. WHAT DO I NEED TO KNOW ABOUT THIS DIET? Eat a low-fat diet. Reduce your fat intake to less than 20-30% of your total daily calories. This is less than  50-60 g of fat per day. Remember that you need some fat in your diet. Ask your dietician what your daily goal should be. Choose nonfat and low-fat healthy foods. Look for the words "nonfat," "low fat," or "fat free." As a guide, look on the label and choose foods with less than 3 g of fat per serving. Eat only one serving. Avoid alcohol. Do not smoke. If you need help quitting, talk with your health care provider. Eat small frequent meals instead of three large heavy meals. WHAT FOODS CAN I EAT? Grains Include healthy grains and starches such as potatoes, wheat bread, fiber-rich cereal, and brown rice. Choose whole grain options whenever possible. In adults, whole grains should account for 45-65% of your daily calories.  Fruits and Vegetables Eat plenty of fruits and vegetables. Fresh fruits and vegetables add fiber to your diet. Meats and Other Protein Sources Eat lean meat such as chicken and pork. Trim any fat off of meat before cooking it. Eggs, fish, and beans are other sources of protein. In adults, these foods should account for 10-35% of your daily calories. Dairy Choose low-fat milk and dairy options. Dairy includes fat and protein, as well as calcium.  Fats and Oils Limit high-fat foods such as fried foods, sweets, baked goods, sugary drinks.  Other Creamy sauces and condiments, such  as mayonnaise, can add extra fat. Think about whether or not you need to use them, or use smaller amounts or low fat options. WHAT FOODS ARE NOT RECOMMENDED? High fat foods, such as: [employer]. Ice cream. [place] toast. Sweet rolls. Pizza. Cheese bread. Foods covered with batter, butter, creamy sauces, or cheese. Fried foods. Sugary drinks and desserts. Foods that cause gas or bloating   This information is not intended to replace advice given to you by your health care provider. Make sure you discuss any questions you have with your health care provider.   Document Released: 06/26/2013  Document Reviewed: 06/26/2013 Elsevier Interactive Patient Education [employer]
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
## 2023-08-08 ENCOUNTER — Inpatient Hospital Stay: Payer: Federal, State, Local not specified - PPO | Attending: Internal Medicine

## 2023-08-08 ENCOUNTER — Telehealth: Payer: Self-pay | Admitting: Surgery

## 2023-08-08 ENCOUNTER — Encounter: Payer: Self-pay | Admitting: Internal Medicine

## 2023-08-08 ENCOUNTER — Inpatient Hospital Stay (HOSPITAL_BASED_OUTPATIENT_CLINIC_OR_DEPARTMENT_OTHER): Payer: Federal, State, Local not specified - PPO | Admitting: Internal Medicine

## 2023-08-08 VITALS — BP 118/78 | HR 89 | Temp 98.6°F | Resp 16 | Wt 142.0 lb

## 2023-08-08 DIAGNOSIS — R946 Abnormal results of thyroid function studies: Secondary | ICD-10-CM

## 2023-08-08 DIAGNOSIS — C7951 Secondary malignant neoplasm of bone: Secondary | ICD-10-CM | POA: Insufficient documentation

## 2023-08-08 DIAGNOSIS — Z79620 Long term (current) use of immunosuppressive biologic: Secondary | ICD-10-CM | POA: Diagnosis not present

## 2023-08-08 DIAGNOSIS — R911 Solitary pulmonary nodule: Secondary | ICD-10-CM | POA: Diagnosis not present

## 2023-08-08 DIAGNOSIS — K529 Noninfective gastroenteritis and colitis, unspecified: Secondary | ICD-10-CM | POA: Diagnosis not present

## 2023-08-08 DIAGNOSIS — C642 Malignant neoplasm of left kidney, except renal pelvis: Secondary | ICD-10-CM | POA: Insufficient documentation

## 2023-08-08 DIAGNOSIS — D509 Iron deficiency anemia, unspecified: Secondary | ICD-10-CM | POA: Diagnosis not present

## 2023-08-08 LAB — CMP (CANCER CENTER ONLY)
ALT: 22 U/L (ref 0–44)
AST: 19 U/L (ref 15–41)
Albumin: 4.2 g/dL (ref 3.5–5.0)
Alkaline Phosphatase: 69 U/L (ref 38–126)
Anion gap: 10 (ref 5–15)
BUN: 11 mg/dL (ref 6–20)
CO2: 27 mmol/L (ref 22–32)
Calcium: 9.8 mg/dL (ref 8.9–10.3)
Chloride: 104 mmol/L (ref 98–111)
Creatinine: 0.98 mg/dL (ref 0.61–1.24)
GFR, Estimated: >60 mL/min (ref >60)
Glucose, Bld: 175 mg/dL — ABNORMAL HIGH (ref 70–99)
Potassium: 4.6 mmol/L (ref 3.5–5.1)
Sodium: 141 mmol/L (ref 135–145)
Total Bilirubin: 0.9 mg/dL (ref 0.0–1.2)
Total Protein: 7.4 g/dL (ref 6.5–8.1)

## 2023-08-08 LAB — CBC WITH DIFFERENTIAL/PLATELET
Abs Immature Granulocytes: 0.03 10*3/uL (ref 0.00–0.07)
Basophils Absolute: 0.1 10*3/uL (ref 0.0–0.1)
Basophils Relative: 1 %
Eosinophils Absolute: 0.3 10*3/uL (ref 0.0–0.5)
Eosinophils Relative: 5 %
HCT: 40.9 % (ref 39.0–52.0)
Hemoglobin: 13.3 g/dL (ref 13.0–17.0)
Immature Granulocytes: 1 %
Lymphocytes Relative: 18 %
Lymphs Abs: 1.1 10*3/uL (ref 0.7–4.0)
MCH: 30.6 pg (ref 26.0–34.0)
MCHC: 32.5 g/dL (ref 30.0–36.0)
MCV: 94 fL (ref 80.0–100.0)
Monocytes Absolute: 0.8 10*3/uL (ref 0.1–1.0)
Monocytes Relative: 13 %
Neutro Abs: 3.7 10*3/uL (ref 1.7–7.7)
Neutrophils Relative %: 62 %
Platelets: 340 10*3/uL (ref 150–400)
RBC: 4.35 MIL/uL (ref 4.22–5.81)
RDW: 13.1 % (ref 11.5–15.5)
WBC: 5.9 10*3/uL (ref 4.0–10.5)
nRBC: 0 % (ref 0.0–0.2)

## 2023-08-08 NOTE — Progress Notes (Unsigned)
Patient is scheduled to have his gallbladder removed on Thursday Feb 6th, due to the gall stones. His pain level today is at about a 7, he is still taking his oxycodone 2 times daily.   Patient had a CT scan on 07/29/2023, and an [place] on 07/25/2023.

## 2023-08-08 NOTE — Telephone Encounter (Signed)
Patient has been advised of Pre-Admission date/time, and Surgery date at [facility].  Surgery Date: 08/11/23 Preadmission Testing Date: 08/09/23 (phone 1p-4p)  Patient has been made aware to call [phone], between 1-3:00pm the day before surgery, to find out what time to arrive for surgery.

## 2023-08-09 ENCOUNTER — Other Ambulatory Visit: Payer: Self-pay

## 2023-08-09 ENCOUNTER — Encounter: Payer: Self-pay | Admitting: Internal Medicine

## 2023-08-09 ENCOUNTER — Encounter
Admission: RE | Admit: 2023-08-09 | Discharge: 2023-08-09 | Disposition: A | Discharge status: Home / Self Care | Payer: Federal, State, Local not specified - PPO | Source: Ambulatory Visit | Attending: Surgery | Admitting: Surgery

## 2023-08-09 ENCOUNTER — Telehealth: Payer: Self-pay | Admitting: *Deleted

## 2023-08-09 VITALS — Ht 70.0 in | Wt 143.0 lb

## 2023-08-09 DIAGNOSIS — E119 Type 2 diabetes mellitus without complications: Secondary | ICD-10-CM

## 2023-08-09 HISTORY — DX: Secondary malignant neoplasm of bone: C79.51

## 2023-08-09 HISTORY — DX: Type 2 diabetes mellitus without complications: E11.9

## 2023-08-09 HISTORY — DX: Pure hypercholesterolemia, unspecified: E78.00

## 2023-08-09 HISTORY — DX: Anemia, unspecified: D64.9

## 2023-08-09 HISTORY — DX: Multiple subsegmental thrombotic pulmonary emboli without acute cor pulmonale: I26.94

## 2023-08-09 HISTORY — DX: Chronic combined systolic (congestive) and diastolic (congestive) heart failure: I50.42

## 2023-08-09 HISTORY — DX: Malignant neoplasm of left kidney, except renal pelvis: C64.2

## 2023-08-09 HISTORY — DX: Atherosclerosis of aorta: I70.0

## 2023-08-09 HISTORY — DX: Atherosclerotic heart disease of native coronary artery without angina pectoris: I25.10

## 2023-08-09 LAB — THYROID PANEL WITH TSH
Free Thyroxine Index: 2.8 (ref 1.2–4.9)
T3 Uptake Ratio: 28 % (ref 24–39)
T4, Total: 10.1 ug/dL (ref 4.5–12.0)
TSH: 0.676 u[IU]/mL (ref 0.450–4.500)

## 2023-08-09 NOTE — Telephone Encounter (Signed)
[facility] Testing submitted via online ordering portal for  SURGICAL PATHOLOGY - CASE: [id number]
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
## 2023-08-09 NOTE — Patient Instructions (Addendum)
 Your procedure is scheduled on: Thursday 08/11/23 Report to the Registration Desk on the 1st floor of the [facility]. To find out your arrival time, please call [phone] between 1PM - 3PM on: Wednesday 08/10/23 If your arrival time is 6:00 am, do not arrive before that time as the [facility] entrance doors do not open until 6:00 am.  REMEMBER: Instructions that are not followed completely may result in serious medical risk, up to and including death; or upon the discretion of your surgeon and anesthesiologist your surgery may need to be rescheduled.  Do not eat food after midnight the night before surgery.  No gum chewing or hard candies.  You may however, drink CLEAR liquids up to 2 hours before you are scheduled to arrive for your surgery. Do not drink anything within 2 hours of your scheduled arrival time.  Clear liquids include: - water   Do NOT drink anything that is not on this list.  One week prior to surgery: Stop Anti-inflammatories (NSAIDS) such as Advil , Aleve, Ibuprofen , Motrin , Naproxen, Naprosyn and Aspirin  based products such as Excedrin, Goody's Powder, BC Powder. Stop ANY OVER THE COUNTER supplements until after surgery.  You may however, continue to take Tylenol  if needed for pain up until the day of surgery.  Stop Dapagliflozin  Pro-metFORMIN  ER (XIGDUO  XR) 11-998 MG 2 days prior to surgery (take last dose Monday 08/08/23)  Stop apixaban  (ELIQUIS ) 5 MG TABS 2 days prior to surgery (take last dose Monday 08/08/23)  Continue taking all of your other prescription medications up until the day of surgery.  ON THE DAY OF SURGERY ONLY TAKE THESE MEDICATIONS WITH SIPS OF WATER :  Metoprolol  Succinate 25 MG  pantoprazole  (PROTONIX ) 40 MG    No Alcohol  for 24 hours before or after surgery.  No Smoking including e-cigarettes for 24 hours before surgery.  No chewable tobacco products for at least 6 hours before surgery.  No nicotine patches on the day of surgery.  Do  not use any recreational drugs for at least a week (preferably 2 weeks) before your surgery.  Please be advised that the combination of cocaine and anesthesia may have negative outcomes, up to and including death. If you test positive for cocaine, your surgery will be cancelled.  On the morning of surgery brush your teeth with toothpaste and water , you may rinse your mouth with mouthwash if you wish. Do not swallow any toothpaste or mouthwash.  Use CHG Soap or wipes as directed on instruction sheet.  Do not wear jewelry, make-up, hairpins, clips or nail polish.  For welded (permanent) jewelry: bracelets, anklets, waist bands, etc.  Please have this removed prior to surgery.  If it is not removed, there is a chance that hospital personnel will need to cut it off on the day of surgery.  Do not wear lotions, powders, or perfumes.   Do not shave body hair from the neck down 48 hours before surgery.  Contact lenses, hearing aids and dentures may not be worn into surgery.  Do not bring valuables to the hospital. [facility] is not responsible for any missing/lost belongings or valuables.   Total Shoulder Arthroplasty:  use Benzoyl Peroxide 5% Gel as directed on instruction sheet.  Bring your C-PAP to the hospital in case you may have to spend the night.   Notify your doctor if there is any change in your medical condition (cold, fever, infection).  Wear comfortable clothing (specific to your surgery type) to the hospital.  After surgery,  you can help prevent lung complications by doing breathing exercises.  Take deep breaths and cough every 1-2 hours. Your doctor may order a device called an Incentive Spirometer to help you take deep breaths. When coughing or sneezing, hold a pillow firmly against your incision with both hands. This is called "splinting." Doing this helps protect your incision. It also decreases belly discomfort.  If you are being admitted to the hospital overnight,  leave your suitcase in the car. After surgery it may be brought to your room.  In case of increased patient census, it may be necessary for you, the patient, to continue your postoperative care in the Same Day Surgery department.  If you are being discharged the day of surgery, you will not be allowed to drive home. You will need a responsible individual to drive you home and stay with you for 24 hours after surgery.   If you are taking public transportation, you will need to have a responsible individual with you.  Please call the Pre-admissions Testing Dept. at [phone] if you have any questions about these instructions.  Surgery Visitation Policy:  Patients having surgery or a procedure may have two visitors.  Children under the age of 11 must have an adult with them who is not the patient.  Temporary Visitor Restrictions Due to increasing cases of flu, RSV and COVID-19: Children ages 50 and under will not be able to visit patients in [facility] hospitals under most circumstances.  Inpatient Visitation:    Visiting hours are 7 a.m. to 8 p.m. Up to four visitors are allowed at one time in a patient room. The visitors may rotate out with other people during the day.  One visitor age 34 or older may stay with the patient overnight and must be in the room by 8 p.m.     Preparing for Surgery with CHLORHEXIDINE  GLUCONATE (CHG) Soap  Chlorhexidine  Gluconate (CHG) Soap  o An antiseptic cleaner that kills germs and bonds with the skin to continue killing germs even after washing  o Used for showering the night before surgery and morning of surgery  Before surgery, you can play an important role by reducing the number of germs on your skin.  CHG (Chlorhexidine  gluconate) soap is an antiseptic cleanser which kills germs and bonds with the skin to continue killing germs even after washing.  Please do not use if you have an allergy to CHG or antibacterial soaps. If your skin  becomes reddened/irritated stop using the CHG.  1. Shower the NIGHT BEFORE SURGERY and the MORNING OF SURGERY with CHG soap.  2. If you choose to wash your hair, wash your hair first as usual with your normal shampoo.  3. After shampooing, rinse your hair and body thoroughly to remove the shampoo.  4. Use CHG as you would any other liquid soap. You can apply CHG directly to the skin and wash gently with a scrungie or a clean washcloth.  5. Apply the CHG soap to your body only from the neck down. Do not use on open wounds or open sores. Avoid contact with your eyes, ears, mouth, and genitals (private parts). Wash face and genitals (private parts) with your normal soap.  6. Wash thoroughly, paying special attention to the area where your surgery will be performed.  7. Thoroughly rinse your body with warm water .  8. Do not shower/wash with your normal soap after using and rinsing off the CHG soap.  9. Pat yourself dry with  a clean towel.  10. Wear clean pajamas to bed the night before surgery.  12. Place clean sheets on your bed the night of your first shower and do not sleep with pets.  13. Shower again with the CHG soap on the day of surgery prior to arriving at the hospital.  14. Do not apply any deodorants/lotions/powders.  15. Please wear clean clothes to the hospital.

## 2023-08-11 ENCOUNTER — Encounter: Payer: Self-pay | Admitting: Surgery

## 2023-08-11 ENCOUNTER — Other Ambulatory Visit: Payer: Self-pay

## 2023-08-11 ENCOUNTER — Ambulatory Visit: Payer: Federal, State, Local not specified - PPO | Admitting: Certified Registered"

## 2023-08-11 ENCOUNTER — Ambulatory Visit: Payer: Federal, State, Local not specified - PPO | Admitting: Urgent Care

## 2023-08-11 ENCOUNTER — Encounter
Admission: RE | Disposition: A | Discharge status: Home / Self Care | Payer: Self-pay | Source: Home / Self Care | Attending: Surgery

## 2023-08-11 ENCOUNTER — Ambulatory Visit
Admission: RE | Admit: 2023-08-11 | Discharge: 2023-08-11 | Disposition: A | Discharge status: Home / Self Care | Payer: Federal, State, Local not specified - PPO | Attending: Surgery | Admitting: Surgery

## 2023-08-11 DIAGNOSIS — E119 Type 2 diabetes mellitus without complications: Secondary | ICD-10-CM | POA: Insufficient documentation

## 2023-08-11 DIAGNOSIS — I10 Essential (primary) hypertension: Secondary | ICD-10-CM | POA: Diagnosis not present

## 2023-08-11 DIAGNOSIS — Z86711 Personal history of pulmonary embolism: Secondary | ICD-10-CM | POA: Insufficient documentation

## 2023-08-11 DIAGNOSIS — K8010 Calculus of gallbladder with chronic cholecystitis without obstruction: Secondary | ICD-10-CM | POA: Insufficient documentation

## 2023-08-11 DIAGNOSIS — Z833 Family history of diabetes mellitus: Secondary | ICD-10-CM | POA: Diagnosis not present

## 2023-08-11 DIAGNOSIS — I2510 Atherosclerotic heart disease of native coronary artery without angina pectoris: Secondary | ICD-10-CM | POA: Diagnosis not present

## 2023-08-11 DIAGNOSIS — Z8249 Family history of ischemic heart disease and other diseases of the circulatory system: Secondary | ICD-10-CM | POA: Insufficient documentation

## 2023-08-11 DIAGNOSIS — N289 Disorder of kidney and ureter, unspecified: Secondary | ICD-10-CM | POA: Insufficient documentation

## 2023-08-11 DIAGNOSIS — G4730 Sleep apnea, unspecified: Secondary | ICD-10-CM | POA: Insufficient documentation

## 2023-08-11 DIAGNOSIS — K8020 Calculus of gallbladder without cholecystitis without obstruction: Secondary | ICD-10-CM

## 2023-08-11 DIAGNOSIS — K219 Gastro-esophageal reflux disease without esophagitis: Secondary | ICD-10-CM | POA: Diagnosis not present

## 2023-08-11 DIAGNOSIS — Z7984 Long term (current) use of oral hypoglycemic drugs: Secondary | ICD-10-CM | POA: Diagnosis not present

## 2023-08-11 DIAGNOSIS — K8070 Calculus of gallbladder and bile duct without cholecystitis without obstruction: Secondary | ICD-10-CM | POA: Diagnosis not present

## 2023-08-11 DIAGNOSIS — Z79899 Other long term (current) drug therapy: Secondary | ICD-10-CM | POA: Diagnosis not present

## 2023-08-11 LAB — GLUCOSE, CAPILLARY
Glucose-Capillary: 133 mg/dL — ABNORMAL HIGH (ref 70–99)
Glucose-Capillary: 192 mg/dL — ABNORMAL HIGH (ref 70–99)

## 2023-08-11 SURGERY — CHOLECYSTECTOMY, ROBOT-ASSISTED, LAPAROSCOPIC
Anesthesia: General | Site: Abdomen

## 2023-08-11 MED ORDER — OXYCODONE HCL 5 MG PO TABS
5.0000 mg | ORAL_TABLET | Freq: Once | ORAL | Status: AC | PRN
  Administered 2023-08-11: 5 mg via ORAL

## 2023-08-11 MED ORDER — MIDAZOLAM HCL 2 MG/2ML IJ SOLN
INTRAMUSCULAR | Status: DC | PRN
  Administered 2023-08-11: 2 mg via INTRAVENOUS

## 2023-08-11 MED ORDER — ESMOLOL HCL 100 MG/10ML IV SOLN
INTRAVENOUS | Status: AC
  Filled 2023-08-11: qty 10

## 2023-08-11 MED ORDER — DEXAMETHASONE SODIUM PHOSPHATE 10 MG/ML IJ SOLN
INTRAMUSCULAR | Status: AC
  Filled 2023-08-11: qty 1

## 2023-08-11 MED ORDER — KETOROLAC TROMETHAMINE 30 MG/ML IJ SOLN
INTRAMUSCULAR | Status: AC
  Filled 2023-08-11: qty 1

## 2023-08-11 MED ORDER — MIDAZOLAM HCL 2 MG/2ML IJ SOLN
INTRAMUSCULAR | Status: AC
  Filled 2023-08-11: qty 2

## 2023-08-11 MED ORDER — ORAL CARE MOUTH RINSE: 15.0000 mL | Freq: Once | OROMUCOSAL | Status: AC

## 2023-08-11 MED ORDER — PROPOFOL 10 MG/ML IV BOLUS
INTRAVENOUS | Status: DC | PRN
  Administered 2023-08-11: 100 mg via INTRAVENOUS

## 2023-08-11 MED ORDER — ONDANSETRON HCL 4 MG/2ML IJ SOLN
INTRAMUSCULAR | Status: DC | PRN
  Administered 2023-08-11: 4 mg via INTRAVENOUS

## 2023-08-11 MED ORDER — BUPIVACAINE-EPINEPHRINE (PF) 0.25% -1:200000 IJ SOLN
INTRAMUSCULAR | Status: AC
  Filled 2023-08-11: qty 30

## 2023-08-11 MED ORDER — DEXMEDETOMIDINE HCL IN NACL 80 MCG/20ML IV SOLN
INTRAVENOUS | Status: DC | PRN
  Administered 2023-08-11 (×2): 4 ug via INTRAVENOUS
  Administered 2023-08-11: 8 ug via INTRAVENOUS

## 2023-08-11 MED ORDER — GABAPENTIN 300 MG PO CAPS
300.0000 mg | ORAL_CAPSULE | ORAL | Status: AC
  Administered 2023-08-11: 300 mg via ORAL

## 2023-08-11 MED ORDER — LIDOCAINE HCL (CARDIAC) PF 100 MG/5ML IV SOSY
PREFILLED_SYRINGE | INTRAVENOUS | Status: DC | PRN
  Administered 2023-08-11: 100 mg via INTRAVENOUS

## 2023-08-11 MED ORDER — CHLORHEXIDINE GLUCONATE CLOTH 2 % EX PADS
6.0000 | MEDICATED_PAD | Freq: Once | CUTANEOUS | Status: AC
  Administered 2023-08-11: 6 via TOPICAL

## 2023-08-11 MED ORDER — KETOROLAC TROMETHAMINE 30 MG/ML IJ SOLN
INTRAMUSCULAR | Status: DC | PRN
  Administered 2023-08-11: 30 mg via INTRAVENOUS

## 2023-08-11 MED ORDER — PHENYLEPHRINE 80 MCG/ML (10ML) SYRINGE FOR IV PUSH (FOR BLOOD PRESSURE SUPPORT)
PREFILLED_SYRINGE | INTRAVENOUS | Status: AC
  Filled 2023-08-11: qty 10

## 2023-08-11 MED ORDER — DROPERIDOL 2.5 MG/ML IJ SOLN: 0.6250 mg | Freq: Once | INTRAMUSCULAR | Status: DC | PRN

## 2023-08-11 MED ORDER — GABAPENTIN 300 MG PO CAPS
ORAL_CAPSULE | ORAL | Status: AC
  Filled 2023-08-11: qty 1

## 2023-08-11 MED ORDER — FENTANYL CITRATE (PF) 100 MCG/2ML IJ SOLN
25.0000 ug | INTRAMUSCULAR | Status: DC | PRN
  Administered 2023-08-11: 25 ug via INTRAVENOUS

## 2023-08-11 MED ORDER — SODIUM CHLORIDE 0.9 % IV SOLN: INTRAVENOUS | Status: DC

## 2023-08-11 MED ORDER — INDOCYANINE GREEN 25 MG IV SOLR
1.2500 mg | INTRAVENOUS | Status: AC
  Administered 2023-08-11: 1.25 mg via INTRAVENOUS

## 2023-08-11 MED ORDER — DEXAMETHASONE SODIUM PHOSPHATE 10 MG/ML IJ SOLN
INTRAMUSCULAR | Status: DC | PRN
  Administered 2023-08-11: 5 mg via INTRAVENOUS

## 2023-08-11 MED ORDER — OXYCODONE HCL 5 MG/5ML PO SOLN: 5.0000 mg | Freq: Once | ORAL | Status: AC | PRN

## 2023-08-11 MED ORDER — BUPIVACAINE LIPOSOME 1.3 % IJ SUSP
INTRAMUSCULAR | Status: AC
  Filled 2023-08-11: qty 10

## 2023-08-11 MED ORDER — FENTANYL CITRATE (PF) 100 MCG/2ML IJ SOLN
INTRAMUSCULAR | Status: DC | PRN
  Administered 2023-08-11 (×2): 50 ug via INTRAVENOUS

## 2023-08-11 MED ORDER — ACETAMINOPHEN 325 MG PO TABS: 650.0000 mg | ORAL_TABLET | Freq: Four times a day (QID) | ORAL | Status: AC | PRN

## 2023-08-11 MED ORDER — ONDANSETRON HCL 4 MG/2ML IJ SOLN
INTRAMUSCULAR | Status: AC
  Filled 2023-08-11: qty 2

## 2023-08-11 MED ORDER — CHLORHEXIDINE GLUCONATE 0.12 % MT SOLN
15.0000 mL | Freq: Once | OROMUCOSAL | Status: AC
  Administered 2023-08-11: 15 mL via OROMUCOSAL

## 2023-08-11 MED ORDER — LIDOCAINE HCL (PF) 2 % IJ SOLN
INTRAMUSCULAR | Status: AC
  Filled 2023-08-11: qty 5

## 2023-08-11 MED ORDER — OXYCODONE HCL 5 MG PO TABS
ORAL_TABLET | ORAL | Status: AC
  Filled 2023-08-11: qty 1

## 2023-08-11 MED ORDER — ACETAMINOPHEN 500 MG PO TABS
ORAL_TABLET | ORAL | Status: AC
  Filled 2023-08-11: qty 2

## 2023-08-11 MED ORDER — CEFAZOLIN SODIUM-DEXTROSE 2-4 GM/100ML-% IV SOLN
INTRAVENOUS | Status: AC
  Filled 2023-08-11: qty 100

## 2023-08-11 MED ORDER — FENTANYL CITRATE (PF) 100 MCG/2ML IJ SOLN
INTRAMUSCULAR | Status: AC
  Filled 2023-08-11: qty 2

## 2023-08-11 MED ORDER — BUPIVACAINE LIPOSOME 1.3 % IJ SUSP
INTRAMUSCULAR | Status: DC | PRN
  Administered 2023-08-11: 10 mL

## 2023-08-11 MED ORDER — CHLORHEXIDINE GLUCONATE 0.12 % MT SOLN
OROMUCOSAL | Status: AC
  Filled 2023-08-11: qty 15

## 2023-08-11 MED ORDER — PROPOFOL 10 MG/ML IV BOLUS
INTRAVENOUS | Status: AC
  Filled 2023-08-11: qty 20

## 2023-08-11 MED ORDER — PHENYLEPHRINE 80 MCG/ML (10ML) SYRINGE FOR IV PUSH (FOR BLOOD PRESSURE SUPPORT)
PREFILLED_SYRINGE | INTRAVENOUS | Status: DC | PRN
  Administered 2023-08-11 (×2): 80 ug via INTRAVENOUS
  Administered 2023-08-11: 160 ug via INTRAVENOUS
  Administered 2023-08-11: 80 ug via INTRAVENOUS

## 2023-08-11 MED ORDER — SUGAMMADEX SODIUM 200 MG/2ML IV SOLN
INTRAVENOUS | Status: DC | PRN
  Administered 2023-08-11: 140 mg via INTRAVENOUS

## 2023-08-11 MED ORDER — INDOCYANINE GREEN 25 MG IV SOLR
INTRAVENOUS | Status: AC
  Filled 2023-08-11: qty 10

## 2023-08-11 MED ORDER — SODIUM CHLORIDE 0.9 % IR SOLN
Status: DC | PRN
  Administered 2023-08-11: 1

## 2023-08-11 MED ORDER — ACETAMINOPHEN 500 MG PO TABS
1000.0000 mg | ORAL_TABLET | ORAL | Status: AC
  Administered 2023-08-11: 1000 mg via ORAL

## 2023-08-11 MED ORDER — ROCURONIUM BROMIDE 10 MG/ML (PF) SYRINGE
PREFILLED_SYRINGE | INTRAVENOUS | Status: AC
  Filled 2023-08-11: qty 10

## 2023-08-11 MED ORDER — BUPIVACAINE LIPOSOME 1.3 % IJ SUSP: 10.0000 mL | Freq: Once | INTRAMUSCULAR | Status: DC

## 2023-08-11 MED ORDER — ACETAMINOPHEN 10 MG/ML IV SOLN: 1000.0000 mg | Freq: Once | INTRAVENOUS | Status: DC | PRN

## 2023-08-11 MED ORDER — CEFAZOLIN SODIUM-DEXTROSE 2-4 GM/100ML-% IV SOLN
2.0000 g | INTRAVENOUS | Status: AC
  Administered 2023-08-11: 2 g via INTRAVENOUS

## 2023-08-11 MED ORDER — BUPIVACAINE-EPINEPHRINE (PF) 0.25% -1:200000 IJ SOLN
INTRAMUSCULAR | Status: DC | PRN
  Administered 2023-08-11: 30 mL

## 2023-08-11 MED ORDER — ESMOLOL HCL 100 MG/10ML IV SOLN
INTRAVENOUS | Status: DC | PRN
Start: 2023-08-11 — End: 2023-08-11
  Administered 2023-08-11 (×3): 10 mg via INTRAVENOUS

## 2023-08-11 MED ORDER — ROCURONIUM BROMIDE 100 MG/10ML IV SOLN
INTRAVENOUS | Status: DC | PRN
  Administered 2023-08-11: 60 mg via INTRAVENOUS
  Administered 2023-08-11: 10 mg via INTRAVENOUS

## 2023-08-11 MED ORDER — CHLORHEXIDINE GLUCONATE CLOTH 2 % EX PADS: 6.0000 | MEDICATED_PAD | Freq: Once | CUTANEOUS | Status: DC

## 2023-08-11 SURGICAL SUPPLY — 38 items
CANNULA CAP OBTURATR AIRSEAL 8 (CAP) IMPLANT
CAUTERY HOOK MNPLR 1.6 DVNC XI (INSTRUMENTS) ×2 IMPLANT
CLIP LIGATING HEMO O LOK GREEN (MISCELLANEOUS) ×2 IMPLANT
DERMABOND ADVANCED .7 DNX12 (GAUZE/BANDAGES/DRESSINGS) ×2 IMPLANT
DRAPE ARM DVNC X/XI (DISPOSABLE) ×8 IMPLANT
DRAPE COLUMN DVNC XI (DISPOSABLE) ×2 IMPLANT
ELECT CAUTERY BLADE TIP 2.5 (TIP) ×1 IMPLANT
ELECT REM PT RETURN 9FT ADLT (ELECTROSURGICAL) ×1 IMPLANT
ELECTRODE CAUTERY BLDE TIP 2.5 (TIP) ×2 IMPLANT
ELECTRODE REM PT RTRN 9FT ADLT (ELECTROSURGICAL) ×2 IMPLANT
FORCEPS BPLR R/ABLATION 8 DVNC (INSTRUMENTS) ×2 IMPLANT
FORCEPS PROGRASP DVNC XI (FORCEP) ×2 IMPLANT
GLOVE SURG SYN 7.0 (GLOVE) ×2 IMPLANT
GLOVE SURG SYN 7.0 PF PI (GLOVE) ×4 IMPLANT
GLOVE SURG SYN 7.5 E (GLOVE) ×2 IMPLANT
GLOVE SURG SYN 7.5 PF PI (GLOVE) ×4 IMPLANT
GOWN STRL REUS W/ TWL LRG LVL3 (GOWN DISPOSABLE) ×8 IMPLANT
IRRIGATION STRYKERFLOW (MISCELLANEOUS) IMPLANT
IRRIGATOR STRYKERFLOW (MISCELLANEOUS) ×1 IMPLANT
IV NS 1000ML BAXH (IV SOLUTION) IMPLANT
KIT PINK PAD W/HEAD ARE REST (MISCELLANEOUS) ×1 IMPLANT
KIT PINK PAD W/HEAD ARM REST (MISCELLANEOUS) ×2 IMPLANT
LABEL OR SOLS (LABEL) ×2 IMPLANT
MANIFOLD NEPTUNE II (INSTRUMENTS) ×2 IMPLANT
NDL HYPO 22X1.5 SAFETY MO (MISCELLANEOUS) ×2 IMPLANT
NEEDLE HYPO 22X1.5 SAFETY MO (MISCELLANEOUS) ×1 IMPLANT
NS IRRIG 500ML POUR BTL (IV SOLUTION) ×2 IMPLANT
PACK LAP CHOLECYSTECTOMY (MISCELLANEOUS) ×2 IMPLANT
SEAL UNIV 5-12 XI (MISCELLANEOUS) ×8 IMPLANT
SET TUBE FILTERED XL AIRSEAL (SET/KITS/TRAYS/PACK) IMPLANT
SOL ELECTROSURG ANTI STICK (MISCELLANEOUS) ×1 IMPLANT
SOLUTION ELECTROSURG ANTI STCK (MISCELLANEOUS) ×2 IMPLANT
SPIKE FLUID TRANSFER (MISCELLANEOUS) ×2 IMPLANT
SUT MNCRL AB 4-0 PS2 18 (SUTURE) ×2 IMPLANT
SUT VIC AB 3-0 SH 27X BRD (SUTURE) IMPLANT
SUT VICRYL 0 UR6 27IN ABS (SUTURE) ×4 IMPLANT
SYS BAG RETRIEVAL 10MM (BASKET) ×1 IMPLANT
SYSTEM BAG RETRIEVAL 10MM (BASKET) ×2 IMPLANT

## 2023-08-11 NOTE — Discharge Instructions (Signed)
 Discharge Instructions: 1.  Patient may shower, but do not scrub wounds heavily and dab dry only. 2.  Do not submerge wounds in pool/tub until fully healed. 3.  Do not apply ointments or hydrogen peroxide to the wounds. 4.  May apply ice packs to the wounds for comfort. 5.  May resume your Eliquis  on 08/13/23. 6.  Do not drive while taking narcotics for pain control.  Prior to driving, make sure you are able to rotate right and left to look at blindspots without significant pain or discomfort. 7.  No heavy lifting or pushing of more than 10-15 lbs for 4 weeks.
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
## 2023-08-11 NOTE — Anesthesia Procedure Notes (Signed)
 Procedure Name: Intubation Date/Time: 08/11/2023 11:39 AM  Performed by: Jackye Spanner, CRNAPre-anesthesia Checklist: Patient identified, Patient being monitored, Timeout performed, Emergency Drugs available and Suction available Patient Re-evaluated:Patient Re-evaluated prior to induction Oxygen Delivery Method: Circle system utilized Preoxygenation: Pre-oxygenation with 100% oxygen Induction Type: IV induction Ventilation: Mask ventilation without difficulty Laryngoscope Size: 3 and McGrath Grade View: Grade I Tube type: Oral Tube size: 7.0 mm Number of attempts: 1 Airway Equipment and Method: Stylet Placement Confirmation: ETT inserted through vocal cords under direct vision, positive ETCO2 and breath sounds checked- equal and bilateral Secured at: 22 cm Tube secured with: Tape Dental Injury: Teeth and Oropharynx as per pre-operative assessment  Comments: Smooth atraumatic intubation, no complications noted.

## 2023-08-11 NOTE — Interval H&P Note (Signed)
 History and Physical Interval Note:  08/11/2023 9:52 AM  Paul Flowers  has presented today for surgery, with the diagnosis of symptomatic cholelithiasis.  The various methods of treatment have been discussed with the patient and family. After consideration of risks, benefits and other options for treatment, the patient has consented to  Procedure(s): XI ROBOTIC ASSISTED LAPAROSCOPIC CHOLECYSTECTOMY (N/A) INDOCYANINE GREEN  FLUORESCENCE IMAGING (ICG) (N/A) as a surgical intervention.  The patient's history has been reviewed, patient examined, no change in status, stable for surgery.  I have reviewed the patient's chart and labs.  Questions were answered to the patient's satisfaction.     Lalia Loudon

## 2023-08-11 NOTE — Transfer of Care (Signed)
 Immediate Anesthesia Transfer of Care Note  Patient: Paul Flowers  Procedure(s) Performed: XI ROBOTIC ASSISTED LAPAROSCOPIC CHOLECYSTECTOMY (Abdomen) INDOCYANINE GREEN  FLUORESCENCE IMAGING (ICG) (Abdomen)  Patient Location: PACU  Anesthesia Type:General  Level of Consciousness: drowsy  Airway & Oxygen Therapy: Patient Spontanous Breathing and Patient connected to face mask oxygen  Post-op Assessment: Report given to RN and Post -op Vital signs reviewed and stable  Post vital signs: Reviewed and stable  Last Vitals:  Vitals Value Taken Time  BP 130/87 08/11/23 1340  Temp 35.9 1340  Pulse 95 08/11/23 1343  Resp 15 08/11/23 1343  SpO2 100 % 08/11/23 1343  Vitals shown include unfiled device data.  Last Pain:  Vitals:   08/11/23 0944  TempSrc: Temporal  PainSc: 4          Complications: No notable events documented.

## 2023-08-11 NOTE — Op Note (Signed)
  Procedure Date:  08/11/2023  Pre-operative Diagnosis:  Symptomatic cholelithiasis  Post-operative Diagnosis: Symptomatic cholelithiasis  Procedure:  Robotic assisted cholecystectomy with ICG FireFly cholangiogram and lysis of adhesions  Surgeon:  Aloysius Sheree Plant, MD  Anesthesia:  General endotracheal  Estimated Blood Loss:  20 ml  Specimens:  gallbladder  Complications:  None  Indications for Procedure:  This is a 53 y.o. male who presents with abdominal pain and workup revealing symptomatic cholelithiasis.  The benefits, complications, treatment options, and expected outcomes were discussed with the patient. The risks of bleeding, infection, recurrence of symptoms, failure to resolve symptoms, bile duct damage, bile duct leak, retained common bile duct stone, bowel injury, and need for further procedures were all discussed with the patient and he was willing to proceed.  Description of Procedure: The patient was correctly identified in the preoperative area and brought into the operating room.  The patient was placed supine with VTE prophylaxis in place.  Appropriate time-outs were performed.  Anesthesia was induced and the patient was intubated.  Appropriate antibiotics were infused.  The abdomen was prepped and draped in a sterile fashion. An infraumbilical incision was made. A cutdown technique was used to enter the abdominal cavity without injury, and a 12 mm robotic port was inserted.  Pneumoperitoneum was obtained with appropriate opening pressures.  Three 8-mm ports were placed in the mid abdomen at the level of the umbilicus under direct visualization.  The DaVinci platform was docked, camera targeted, and instruments were placed under direct visualization.  The patient had omentum and transverse colon adhered to the anterior abdominal wall in the upper midline from prior surgery, impeding view of the gallbladder.  These were thick adhesions which required cautery for full  dissection, without injury to the colon.  This allowed exposure of the gallbladder.  The gallbladder was identified.  The fundus was grasped and retracted cephalad.  Further adhesions were lysed bluntly and with electrocautery. The infundibulum was grasped and retracted laterally, exposing the peritoneum overlying the gallbladder.  This was incised with electrocautery and extended on either side of the gallbladder.  FireFly cholangiogram was then obtained, and we were able to clearly identify the cystic duct and common bile duct.  The cystic duct and cystic artery were carefully dissected with combination of cautery and blunt dissection.  Both were clipped twice proximally and once distally, cutting in between.  The gallbladder was taken from the gallbladder fossa in a retrograde fashion with electrocautery. The gallbladder was placed in an Endocatch bag. The liver bed was inspected and any bleeding was controlled with electrocautery. The right upper quadrant was then inspected again revealing intact clips, no bleeding, and no ductal injury.  The area was thoroughly irrigated.  The 8 mm ports were removed under direct visualization and the 12 mm port was removed.  The Endocatch bag was brought out via the umbilical incision. The fascial opening was closed using 0 vicryl suture.  Local anesthetic was infused in all incisions and the incisions were closed with 4-0 Monocryl.  The wounds were cleaned and sealed with DermaBond.  The patient was emerged from anesthesia and extubated and brought to the recovery room for further management.  The patient tolerated the procedure well and all counts were correct at the end of the case.   Aloysius Sheree Plant, MD

## 2023-08-12 ENCOUNTER — Other Ambulatory Visit: Payer: Self-pay | Admitting: Internal Medicine

## 2023-08-14 ENCOUNTER — Other Ambulatory Visit: Payer: Self-pay | Admitting: Internal Medicine

## 2023-08-15 ENCOUNTER — Encounter: Payer: Self-pay | Admitting: Internal Medicine

## 2023-08-15 LAB — SURGICAL PATHOLOGY

## 2023-08-19 DIAGNOSIS — C642 Malignant neoplasm of left kidney, except renal pelvis: Secondary | ICD-10-CM | POA: Diagnosis not present

## 2023-08-22 ENCOUNTER — Emergency Department
Admission: EM | Admit: 2023-08-22 | Discharge: 2023-08-22 | Disposition: A | Discharge status: Home / Self Care | Payer: Federal, State, Local not specified - PPO | Attending: Emergency Medicine | Admitting: Emergency Medicine

## 2023-08-22 ENCOUNTER — Emergency Department: Payer: Federal, State, Local not specified - PPO

## 2023-08-22 ENCOUNTER — Encounter: Payer: Self-pay | Admitting: Surgery

## 2023-08-22 ENCOUNTER — Other Ambulatory Visit: Payer: Self-pay

## 2023-08-22 ENCOUNTER — Encounter: Payer: Self-pay | Admitting: Emergency Medicine

## 2023-08-22 ENCOUNTER — Encounter: Payer: Self-pay | Admitting: Internal Medicine

## 2023-08-22 DIAGNOSIS — R1010 Upper abdominal pain, unspecified: Secondary | ICD-10-CM | POA: Insufficient documentation

## 2023-08-22 DIAGNOSIS — Z7901 Long term (current) use of anticoagulants: Secondary | ICD-10-CM | POA: Diagnosis not present

## 2023-08-22 DIAGNOSIS — M25511 Pain in right shoulder: Secondary | ICD-10-CM | POA: Insufficient documentation

## 2023-08-22 DIAGNOSIS — E119 Type 2 diabetes mellitus without complications: Secondary | ICD-10-CM | POA: Insufficient documentation

## 2023-08-22 DIAGNOSIS — K5730 Diverticulosis of large intestine without perforation or abscess without bleeding: Secondary | ICD-10-CM | POA: Diagnosis not present

## 2023-08-22 DIAGNOSIS — R079 Chest pain, unspecified: Secondary | ICD-10-CM | POA: Diagnosis not present

## 2023-08-22 DIAGNOSIS — M25512 Pain in left shoulder: Secondary | ICD-10-CM | POA: Diagnosis not present

## 2023-08-22 DIAGNOSIS — Z905 Acquired absence of kidney: Secondary | ICD-10-CM | POA: Diagnosis not present

## 2023-08-22 DIAGNOSIS — I10 Essential (primary) hypertension: Secondary | ICD-10-CM | POA: Insufficient documentation

## 2023-08-22 DIAGNOSIS — I700 Atherosclerosis of aorta: Secondary | ICD-10-CM | POA: Diagnosis not present

## 2023-08-22 DIAGNOSIS — R0789 Other chest pain: Secondary | ICD-10-CM | POA: Diagnosis not present

## 2023-08-22 DIAGNOSIS — R918 Other nonspecific abnormal finding of lung field: Secondary | ICD-10-CM | POA: Diagnosis not present

## 2023-08-22 DIAGNOSIS — N200 Calculus of kidney: Secondary | ICD-10-CM | POA: Diagnosis not present

## 2023-08-22 LAB — CBC
HCT: 39.3 % (ref 39.0–52.0)
Hemoglobin: 12.9 g/dL — ABNORMAL LOW (ref 13.0–17.0)
MCH: 29.9 pg (ref 26.0–34.0)
MCHC: 32.8 g/dL (ref 30.0–36.0)
MCV: 91 fL (ref 80.0–100.0)
Platelets: 318 10*3/uL (ref 150–400)
RBC: 4.32 MIL/uL (ref 4.22–5.81)
RDW: 12.1 % (ref 11.5–15.5)
WBC: 6.7 10*3/uL (ref 4.0–10.5)
nRBC: 0 % (ref 0.0–0.2)

## 2023-08-22 LAB — BASIC METABOLIC PANEL
Anion gap: 9 (ref 5–15)
BUN: 10 mg/dL (ref 6–20)
CO2: 27 mmol/L (ref 22–32)
Calcium: 9.5 mg/dL (ref 8.9–10.3)
Chloride: 101 mmol/L (ref 98–111)
Creatinine, Ser: 0.93 mg/dL (ref 0.61–1.24)
GFR, Estimated: >60 mL/min (ref >60)
Glucose, Bld: 291 mg/dL — ABNORMAL HIGH (ref 70–99)
Potassium: 4 mmol/L (ref 3.5–5.1)
Sodium: 137 mmol/L (ref 135–145)

## 2023-08-22 LAB — TROPONIN I (HIGH SENSITIVITY)
Troponin I (High Sensitivity): 4 ng/L (ref <18)
Troponin I (High Sensitivity): 4 ng/L (ref <18)

## 2023-08-22 MED ORDER — IOHEXOL 350 MG/ML SOLN
75.0000 mL | Freq: Once | INTRAVENOUS | Status: AC | PRN
  Administered 2023-08-22: 75 mL via INTRAVENOUS

## 2023-08-22 MED ORDER — MORPHINE SULFATE (PF) 4 MG/ML IV SOLN
4.0000 mg | Freq: Once | INTRAVENOUS | Status: AC
  Administered 2023-08-22: 4 mg via INTRAVENOUS
  Filled 2023-08-22: qty 1

## 2023-08-22 MED ORDER — IOHEXOL 300 MG/ML  SOLN
100.0000 mL | Freq: Once | INTRAMUSCULAR | Status: AC | PRN
  Administered 2023-08-22: 80 mL via INTRAVENOUS

## 2023-08-22 NOTE — ED Provider Triage Note (Signed)
Emergency Medicine Provider Triage Evaluation Note  HOWARD BUNTE , a 53 y.o. male  was evaluated in triage.  Pt complains of cp, sent by oncology to r/o mi/pe, has hx pe, on blood thinner.  Review of Systems  Positive:  Negative:   Physical Exam  BP (!) 120/92 (BP Location: Left Arm)   Pulse (!) 114   Temp 98.1 F (36.7 C) (Oral)   Resp 16   Ht 5\' 10"  (1.778 m)   Wt 64.9 kg   SpO2 100%   BMI 20.52 kg/m  Gen:   Awake, no distress   Resp:  Normal effort  MSK:   Moves extremities without difficulty  Other:    Medical Decision Making  Medically screening exam initiated at 1:42 PM.  Appropriate orders placed.  [name] was informed that the remainder of the evaluation will be completed by another provider, this initial triage assessment does not replace that evaluation, and the importance of remaining in the ED until their evaluation is complete.     Faythe Ghee, PA-C 08/22/23 1343

## 2023-08-22 NOTE — Anesthesia Postprocedure Evaluation (Signed)
Anesthesia Post Note  Patient: Paul Flowers  Procedure(s) Performed: XI ROBOTIC ASSISTED LAPAROSCOPIC CHOLECYSTECTOMY (Abdomen) INDOCYANINE GREEN FLUORESCENCE IMAGING (ICG) (Abdomen)  Patient location during evaluation: PACU Anesthesia Type: General Level of consciousness: awake and alert Pain management: pain level controlled Vital Signs Assessment: post-procedure vital signs reviewed and stable Respiratory status: spontaneous breathing, nonlabored ventilation, respiratory function stable and patient connected to nasal cannula oxygen Cardiovascular status: blood pressure returned to baseline and stable Postop Assessment: no apparent nausea or vomiting Anesthetic complications: no   No notable events documented.   Last Vitals:  Vitals:   08/11/23 1415 08/11/23 1433  BP: 130/83 (!) 147/95  Pulse: 81 75  Resp: 13 16  Temp: 36.4 C (!) 36.3 C  SpO2: 100% 100%    Last Pain:  Vitals:   08/12/23 0955  TempSrc:   PainSc: 2                  Yevette Edwards

## 2023-08-22 NOTE — Discharge Instructions (Signed)
CT imaging showed some evidence of free air in the stomach which is likely related to your recent surgery.  It is otherwise showing no acute pathology such as damage to your bowels or other organs.  No evidence of blood clots in your lungs.  Markers for your heart are normal today.  Please follow-up as planned with your surgery team tomorrow.  Also reach out to your cardiology team for follow-up.  Please return for any worsening symptoms.

## 2023-08-22 NOTE — ED Triage Notes (Signed)
Pt in via POV, reports intermittent chest pain w/ radiation to left shoulder since Friday, associated exertional shortness of breath, and discomfort w/ deep breaths.  Advised per oncology to be evaluated in ER to rule out MI and PE.    Patient w/ hx of nephrectomy w/ bilateral PE's post op, currently on Eliquis.

## 2023-08-22 NOTE — ED Provider Notes (Signed)
[facility] Provider Note    Event Date/Time   First MD Initiated Contact with Patient 08/22/23 1721     (approximate)   History   Chest Pain   HPI Paul Flowers is a 53 y.o. 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  Patient states intermittent shortness of breath with pain to his left shoulder for 4 days.  Discomfort when he takes a deep breath.  Prior history of PEs in his oncology office sent him to the ER to rule out heart attack or PE.  Patient states some mild upper abdominal pain but otherwise denies nausea, vomiting, diarrhea, constipation.  Has not taken anything for the symptoms.  Previously on chemotherapy that but that was stopped in late 2024.  No radiation therapy since last September.  Patient had cholecystectomy 11 days ago.     Physical Exam   Triage Vital Signs: ED Triage Vitals  Encounter Vitals Group     BP 08/22/23 1324 (!) 120/92     Systolic BP Percentile --      Diastolic BP Percentile --      Pulse Rate 08/22/23 1324 (!) 114     Resp 08/22/23 1324 16     Temp 08/22/23 1324 98.1 F (36.7 C)     Temp Source 08/22/23 1324 Oral     SpO2 08/22/23 1324 100 %     Weight 08/22/23 1325 143 lb (64.9 kg)     Height 08/22/23 1325 5\' 10"  (1.778 m)     Head Circumference --      Peak Flow --      Pain Score 08/22/23 1325 3     Pain Loc --      Pain Education --      Exclude from Growth Chart --     Most recent vital signs: Vitals:   08/22/23 1730 08/22/23 1810  BP: 108/81   Pulse: 99   Resp: 16   Temp:    SpO2: 100% 100%   Physical Exam: I have reviewed the vital signs and nursing notes. General: Awake, alert, no acute distress.  Nontoxic appearing. Head:  Atraumatic, normocephalic.   ENT:  EOM intact, PERRL. Oral mucosa is pink and moist with no lesions. Neck: Neck is supple with full range of motion,  No meningeal signs. Cardiovascular:  RRR, No murmurs. Peripheral pulses palpable and equal bilaterally. Respiratory:  Symmetrical chest wall expansion.  No rhonchi, rales, or wheezes.  Good air movement throughout.  No use of accessory muscles.   Musculoskeletal:  No cyanosis or edema. Moving extremities with full ROM Abdomen:  Soft, mild tenderness to palpation in the upper abdominal regions but no obvious peritonitic features, nondistended. Neuro:  GCS 15, moving all four extremities, interacting appropriately. Speech clear. Psych:  Calm, appropriate.   Skin:  Warm, dry, no rash.    ED Results / Procedures / Treatments   Labs (all labs ordered are listed, but only abnormal results are displayed) Labs Reviewed  BASIC METABOLIC PANEL - Abnormal; Notable for the following components:      Result Value   Glucose, Bld 291 (*)    All other components within normal limits  CBC - Abnormal; Notable for the following components:   Hemoglobin 12.9 (*)    All other components within normal limits  TROPONIN I (HIGH SENSITIVITY)  TROPONIN I (HIGH SENSITIVITY)  EKG My EKG interpretation: Rate of 112, sinus tachycardia, normal axis, normal intervals.  No acute ST elevations or depressions   RADIOLOGY Independently interpreted chest x-ray with small amount of free air below the diaphragm.  Independently interpreted CTA chest and CT abdomen/pelvis with small evidence of intra-abdominal free air.  No evidence of PE.  Radiologist confirms this is likely related to recent cholecystectomy   PROCEDURES:  Critical Care performed: No  Procedures   MEDICATIONS ORDERED IN ED: Medications  iohexol (OMNIPAQUE) 350 MG/ML injection 75 mL (75 mLs Intravenous Contrast Given 08/22/23 1540)  morphine (PF) 4 MG/ML injection 4 mg (4 mg Intravenous Given 08/22/23 1750)  iohexol (OMNIPAQUE) 300 MG/ML solution 100 mL (80 mLs Intravenous Contrast Given 08/22/23 1758)     IMPRESSION / MDM / ASSESSMENT AND  PLAN / ED COURSE  I reviewed the triage vital signs and the nursing notes.                              Differential diagnosis includes, but is not limited to, PE, ACS, pneumonia, pneumothorax, postoperative complication, chest wall pain  Patient's presentation is most consistent with acute complicated illness / injury requiring diagnostic workup.  Patient is a 53 year old male presenting today for bilateral shoulder pain and mild left-sided chest pain.  On exam he has slight tenderness to the upper abdomen but no obvious peritonitic features.  Slight tachycardia but otherwise vital signs stable and patient with minimal distress.  EKG with no ischemic findings and troponins negative x 2.  Chest x-ray and CTA chest evaluated for PE is negative but does show evidence of small amount of intra-abdominal free air.  Follow-up CT abdomen/pelvis was ordered.  Patient was given morphine with complete resolution of symptoms.  CT abdomen/pelvis shows free air in the abdomen.  Discussed with radiologist who states this is likely from his cholecystectomy.  Patient has no abdominal pain on exam at this time with no concerning peritonitic features.  No other acute findings in the abdomen/pelvis.  Patient has follow-up with his surgery team tomorrow and wishes to go home and see them at this time.  I feel comfortable with this plan as he is currently asymptomatic and vital signs are stable.  Was given strict return precautions for any worsening symptoms.  The patient is on the cardiac monitor to evaluate for evidence of arrhythmia and/or significant heart rate changes. Clinical Course as of 08/22/23 2040  Mon Aug 22, 2023  1610 Radiology - no bowel injury. Suspect free abdominal air is related to recent surgery. No further issues [DW]    Clinical Course User Index [DW] Janith Lima, MD     FINAL CLINICAL IMPRESSION(S) / ED DIAGNOSES   Final diagnoses:  Chest pain, unspecified type     Rx / DC Orders    ED Discharge Orders     None        Note:  This document was prepared using Dragon voice recognition software and may include unintentional dictation errors.   Janith Lima, MD 08/22/23 [id number]

## 2023-08-23 ENCOUNTER — Telehealth: Payer: Self-pay | Admitting: *Deleted

## 2023-08-23 ENCOUNTER — Telehealth: Payer: Self-pay

## 2023-08-23 ENCOUNTER — Encounter: Payer: Self-pay | Admitting: Physician Assistant

## 2023-08-23 ENCOUNTER — Inpatient Hospital Stay: Payer: Federal, State, Local not specified - PPO

## 2023-08-23 ENCOUNTER — Ambulatory Visit (INDEPENDENT_AMBULATORY_CARE_PROVIDER_SITE_OTHER): Payer: Federal, State, Local not specified - PPO | Admitting: Physician Assistant

## 2023-08-23 VITALS — BP 118/83 | HR 91 | Temp 98.8°F | Ht 70.0 in | Wt 139.0 lb

## 2023-08-23 DIAGNOSIS — K8020 Calculus of gallbladder without cholecystitis without obstruction: Secondary | ICD-10-CM

## 2023-08-23 DIAGNOSIS — Z09 Encounter for follow-up examination after completed treatment for conditions other than malignant neoplasm: Secondary | ICD-10-CM

## 2023-08-23 NOTE — Patient Instructions (Signed)

## 2023-08-23 NOTE — Transitions of Care (Post Inpatient/ED Visit) (Signed)
08/23/2023  Name: Paul Flowers MRN: [id number] DOB: [birth year]  Today's TOC FU Call Status: Today's TOC FU Call Status:: Successful TOC FU Call Completed TOC FU Call Complete Date: 08/23/23 Patient's Name and Date of Birth confirmed.  Transition Care Management Follow-up Telephone Call Date of Discharge: 08/22/23 Discharge Facility: [facility]) Type of Discharge: Emergency Department Reason for ED Visit: Other: (chest pain) How have you been since you were released from the hospital?: Better Any questions or concerns?: No  Items Reviewed: Did you receive and understand the discharge instructions provided?: Yes Medications obtained,verified, and reconciled?: Yes (Medications Reviewed) Any new allergies since your discharge?: No Dietary orders reviewed?: Yes Do you have support at home?: No  Medications Reviewed Today: Medications Reviewed Today     Reviewed by Karena Addison, LPN (Licensed Practical Nurse) on 08/23/23 at 1502  Med List Status: <None>   Medication Order Taking? Sig Documenting Provider Last Dose Status Informant  acetaminophen (TYLENOL) 325 MG tablet [id number]  Take 2 tablets (650 mg total) by mouth every 6 (six) hours as needed for mild pain (pain score 1-3). Henrene Dodge, MD  Active   apixaban (ELIQUIS) 5 MG TABS tablet [id number] No Take 1 tablet (5 mg total) by mouth 2 (two) times daily. Michaelyn Barter, MD 08/08/2023 Evening Active   atorvastatin (LIPITOR) 20 MG tablet [id number] No TAKE 1 TABLET BY MOUTH EVERY DAY [name], MD 08/10/2023 Active   Dapagliflozin Pro-metFORMIN ER (XIGDUO XR) 11-998 MG TB24 [id number] No Take 1 tablet by mouth daily. Thapa, [place], MD 08/08/2023 Active   diphenoxylate-atropine (LOMOTIL) 2.5-0.025 MG tablet [id number] No Take 1 tablet by mouth 4 (four) times daily as needed for diarrhea or loose stools. Tresa Moore, MD Past Week Active   docusate sodium (COLACE) 100 MG capsule [id number] No Take  1 capsule (100 mg total) by mouth 2 (two) times daily. Harrie Foreman, PA-C Past Week Active Self  fluticasone (FLONASE) 50 MCG/ACT nasal spray [id number] No Place 1 spray into both nostrils daily. Raechel Chute, MD Past Week Active Self  lidocaine-prilocaine (EMLA) cream [id number] No Apply 1 Application topically as needed. Apply to port 1 hour prior to use. Cover with plastic wrap.  Patient not taking: Reported on 06/28/2023   Michaelyn Barter, MD Not Taking Active   magic mouthwash (multi-ingredient) oral suspension [id number] No Swish and swallow 5-10 mLs 4 (four) times daily as needed.  Patient not taking: Reported on 06/28/2023    Not Taking Active   magic mouthwash (multi-ingredient) oral suspension [id number] No Swish and swallow 5-10 mLs 4 (four) times daily.  Patient not taking: Reported on 08/11/2023   Michaelyn Barter, MD Not Taking Active   Metoprolol Succinate 25 MG CS24 [id number] No Take 1 capsule (25 mg total) by mouth daily. Croitoru, Rachelle Hora, MD 08/10/2023 Morning Active   montelukast (SINGULAIR) 10 MG tablet [id number] No TAKE 1 TABLET BY MOUTH EVERYDAY AT BEDTIME [name], MD 08/10/2023 Active   ondansetron (ZOFRAN) 8 MG tablet [id number] No Take 1 tablet (8 mg total) by mouth every 8 (eight) hours as needed for nausea or vomiting.  Patient not taking: Reported on 08/09/2023   Michaelyn Barter, MD Not Taking Active   ONE [facility] LANCETS MISC [id number] No Use to test blood sugar once daily Reather Littler, MD Taking Active Self  oxyCODONE-acetaminophen (PERCOCET/ROXICET) 5-325 MG tablet [id number] No Take 1 tablet by mouth every 4 (four) hours as needed for severe pain (pain score 7-10). Borders, Daryl Eastern, NP  08/10/2023 Active   pantoprazole (PROTONIX) 40 MG tablet [id number]  TAKE 1 TABLET BY MOUTH EVERY DAY [name], MD  Active   prednisoLONE sodium phosphate (INFLAMASE FORTE) 1 % ophthalmic solution [id number] No 1 drop 4 (four) times daily. [provider] 08/10/2023 Active              Home Care and Equipment/Supplies: Were Home Health Services Ordered?: NA Any new equipment or medical supplies ordered?: NA  Functional Questionnaire: Do you need assistance with bathing/showering or dressing?: No Do you need assistance with meal preparation?: No Do you need assistance with eating?: No Do you have difficulty maintaining continence: No Do you need assistance with getting out of bed/getting out of a chair/moving?: No Do you have difficulty managing or taking your medications?: No  Follow up appointments reviewed: PCP Follow-up appointment confirmed?: NA Specialist Hospital Follow-up appointment confirmed?: Yes Date of Specialist follow-up appointment?: 08/23/23 Follow-Up Specialty Provider:: surgeon Do you need transportation to your follow-up appointment?: No Do you understand care options if your condition(s) worsen?: Yes-patient verbalized understanding    SIGNATURE Karena Addison, LPN [facility] Nurse Health Advisor Direct Dial [phone]

## 2023-08-23 NOTE — Progress Notes (Signed)
Nutrition Follow-up:  Patient with stage IV, left clear cell RCC.  S/p radical nephrectomy and adrenalectomy.  Planning to start cabozantinib 4 weeks after gallbladder surgery.   Spoke with patient via phone for nutrition follow-up.  Reports that appetite has not been good especially around the time of surgery but recently has picked back up.  Ate a breakfast bowl for breakfast this am.  Has been eating mostly soups (vegetable, brunswick stew).  Drank a carnation instant breakfast one morning.  Has been trying to drink glucerna shake.  Eating has been hard.      Medications: reviewed  Labs: reviewed  Anthropometrics:   Weight 143 lb on 2/17 140 lb on 1/20 138 lb on 12/13  UBW of 181 lb on Jan 2024   NUTRITION DIAGNOSIS: Inadequate oral intake continues   INTERVENTION:  Continue oral nutrition supplements for added calories and protein Continue foods high in calories and protein to prevent weight loss    MONITORING, EVALUATION, GOAL: weight trends, intake   NEXT VISIT: Tuesday, Mar 11 phone call  Awad Gladd B. Freida Busman, RD, LDN Registered Dietitian [phone]

## 2023-08-23 NOTE — Telephone Encounter (Signed)
Mailed FMLA to [employer]

## 2023-08-23 NOTE — Progress Notes (Unsigned)
[facility] POST-OP OFFICE VISIT  08/24/2023  HPI: Paul Flowers is a 53 y.o. male 12 days s/p robotic assisted laparoscopic cholecystectomy for symptomatic cholelithiasis with Dr Aleen Campi  He did present to the ED on 02/17 secondary to reports of CP. He underwent CTA which showed pneumoperitoneum but was without evidence of PE. He did have labs which I have reviewed and were reassuring. He was discharged home.   Today, he report he is doing well Abdomen is minimally sore; primarily umbilical incision  No fever, chills, nausea, emesis  He has been able to tolerate PO; no diarrhea Ambulating well  No other complaints   Vital signs: BP 118/83   Pulse 91   Temp 98.8 F (37.1 C) (Oral)   Ht 5\' 10"  (1.778 m)   Wt 139 lb (63 kg)   SpO2 98%   BMI 19.94 kg/m    Physical Exam: Constitutional: Well appearing male, NAD Abdomen: Soft, non-tender, non-distended, no rebound/guarding Skin: Laparoscopic incisions are healing well, no erythema or drainage   Assessment/Plan: This is a 53 y.o. male 12 days s/p robotic assisted laparoscopic cholecystectomy for symptomatic cholelithiasis with Dr Aleen Campi   - Pain control prn; OTC modalities should be sufficient at this stage   - Reviewed wound care recommendation  - Reviewed lifting restrictions; 4 weeks total  - Reviewed surgical pathology; CCC  - He can follow up on as needed basis; He understands to call with questions/concerns  -- Lynden Oxford, PA-C [facility] 08/24/2023, 9:42 AM M-F: 7am - 4pm

## 2023-08-24 ENCOUNTER — Other Ambulatory Visit: Payer: Self-pay | Admitting: *Deleted

## 2023-08-24 ENCOUNTER — Encounter: Payer: Self-pay | Admitting: Hospice and Palliative Medicine

## 2023-08-24 ENCOUNTER — Encounter: Payer: Federal, State, Local not specified - PPO | Admitting: Physician Assistant

## 2023-08-24 DIAGNOSIS — C7951 Secondary malignant neoplasm of bone: Secondary | ICD-10-CM

## 2023-08-24 MED ORDER — OXYCODONE-ACETAMINOPHEN 5-325 MG PO TABS: 1.0000 | ORAL_TABLET | ORAL | 0 refills | Status: DC | PRN

## 2023-08-25 ENCOUNTER — Ambulatory Visit: Payer: Federal, State, Local not specified - PPO | Admitting: Internal Medicine

## 2023-08-25 ENCOUNTER — Encounter: Payer: Self-pay | Admitting: Internal Medicine

## 2023-08-25 VITALS — BP 118/78 | HR 82 | Temp 98.6°F | Resp 16 | Ht 70.0 in | Wt 140.2 lb

## 2023-08-25 DIAGNOSIS — Z7984 Long term (current) use of oral hypoglycemic drugs: Secondary | ICD-10-CM | POA: Diagnosis not present

## 2023-08-25 DIAGNOSIS — Z23 Encounter for immunization: Secondary | ICD-10-CM

## 2023-08-25 DIAGNOSIS — I152 Hypertension secondary to endocrine disorders: Secondary | ICD-10-CM | POA: Diagnosis not present

## 2023-08-25 DIAGNOSIS — E119 Type 2 diabetes mellitus without complications: Secondary | ICD-10-CM

## 2023-08-25 DIAGNOSIS — E1159 Type 2 diabetes mellitus with other circulatory complications: Secondary | ICD-10-CM

## 2023-08-25 LAB — POCT GLYCOSYLATED HEMOGLOBIN (HGB A1C): HbA1c POC (<> result, manual entry): 7.4 % (ref 4.0–5.6)

## 2023-08-25 LAB — GLUCOSE, POCT (MANUAL RESULT ENTRY): POC Glucose: 78 mg/dL (ref 70–99)

## 2023-08-25 LAB — HEMOGLOBIN A1C: Hemoglobin A1C: 7.4

## 2023-08-25 NOTE — Progress Notes (Unsigned)
Subjective:  Patient ID: Paul Flowers, male    DOB: 29-Aug-1970  Age: 53 y.o. MRN: [id number]  CC: Anemia and Diabetes   HPI [name] presents for f/up ----  Discussed the use of AI scribe software for clinical note transcription with the patient, who gave verbal consent to proceed.  History of Present Illness   Paul Flowers is a 53 year old male who presents for follow-up after gallbladder surgery.  He underwent gallbladder surgery recently and is now following up post-operatively. During the procedure, some air was trapped in his body, which took a while to be reabsorbed, causing discomfort. Due to his history of embolism, he visited the emergency room, where a CT scan of the chest and abdomen was performed, revealing only an air pocket. Currently, his chest and stomach feel fine, with no nausea, vomiting, diarrhea, or constipation. He is using Tylenol and oxycodone for pain management.  He has a history of embolism and blood clots, which prompted the emergency room visit after his surgery. He is not experiencing any weakness, dizziness, or lightheadedness.  His glucose levels have been elevated, with a recent reading over 200 mg/dL, although he typically monitors them at home and finds them in the 160-170 mg/dL range. No symptoms of excessive thirst or urination, but he mentions experiencing dry mouth, which he attributes to immunotherapy treatments. He is taking Betaine and B18 tablets to manage this symptom.  He has started taking iron tablets every other day due to previous intolerance when taken daily. He denies any trouble swallowing or blood in his stool. He is not currently taking any medication for diarrhea or constipation.       Outpatient Medications Prior to Visit  Medication Sig Dispense Refill   acetaminophen (TYLENOL) 325 MG tablet Take 2 tablets (650 mg total) by mouth every 6 (six) hours as needed for mild pain (pain score 1-3).     apixaban (ELIQUIS) 5 MG TABS  tablet Take 1 tablet (5 mg total) by mouth 2 (two) times daily. 60 tablet 3   atorvastatin (LIPITOR) 20 MG tablet TAKE 1 TABLET BY MOUTH EVERY DAY 90 tablet 1   Dapagliflozin Pro-metFORMIN ER (XIGDUO XR) 11-998 MG TB24 Take 1 tablet by mouth daily. 90 tablet 1   fluticasone (FLONASE) 50 MCG/ACT nasal spray Place 1 spray into both nostrils daily. 18.2 mL 2   Metoprolol Succinate 25 MG CS24 Take 1 capsule (25 mg total) by mouth daily. 90 capsule 3   montelukast (SINGULAIR) 10 MG tablet TAKE 1 TABLET BY MOUTH EVERYDAY AT BEDTIME 90 tablet 1   ONE TOUCH LANCETS MISC Use to test blood sugar once daily 200 each 2   oxyCODONE-acetaminophen (PERCOCET/ROXICET) 5-325 MG tablet Take 1 tablet by mouth every 4 (four) hours as needed for severe pain (pain score 7-10). 45 tablet 0   pantoprazole (PROTONIX) 40 MG tablet TAKE 1 TABLET BY MOUTH EVERY DAY 30 tablet 0   prednisoLONE sodium phosphate (INFLAMASE FORTE) 1 % ophthalmic solution 1 drop 4 (four) times daily.     diphenoxylate-atropine (LOMOTIL) 2.5-0.025 MG tablet Take 1 tablet by mouth 4 (four) times daily as needed for diarrhea or loose stools. 30 tablet 1   docusate sodium (COLACE) 100 MG capsule Take 1 capsule (100 mg total) by mouth 2 (two) times daily.     No facility-administered medications prior to visit.    ROS Review of Systems  Objective:  BP 118/78 (BP Location: Left Arm, Patient Position: Sitting, Cuff  Size: Small)   Pulse 82   Temp 98.6 F (37 C) (Oral)   Resp 16   Ht 5\' 10"  (1.778 m)   Wt 140 lb 3.2 oz (63.6 kg)   SpO2 97%   BMI 20.12 kg/m   BP Readings from Last 3 Encounters:  08/25/23 118/78  08/23/23 118/83  08/22/23 118/81    Wt Readings from Last 3 Encounters:  08/25/23 140 lb 3.2 oz (63.6 kg)  08/23/23 139 lb (63 kg)  08/22/23 143 lb (64.9 kg)    Physical Exam  Lab Results  Component Value Date   WBC 6.7 08/22/2023   HGB 12.9 (L) 08/22/2023   HCT 39.3 08/22/2023   PLT 318 08/22/2023   GLUCOSE 291 (H)  08/22/2023   CHOL 132 02/22/2023   TRIG 105.0 02/22/2023   HDL 53.10 02/22/2023   LDLDIRECT 149.3 02/12/2013   LDLCALC 58 02/22/2023   ALT 22 08/08/2023   AST 19 08/08/2023   NA 137 08/22/2023   K 4.0 08/22/2023   CL 101 08/22/2023   CREATININE 0.93 08/22/2023   BUN 10 08/22/2023   CO2 27 08/22/2023   TSH 0.676 08/08/2023   PSA 0.39 03/18/2023   INR 1.6 (H) 05/15/2023   HGBA1C 7.4 08/25/2023   MICROALBUR 0.7 02/22/2023    CT ABDOMEN PELVIS W CONTRAST Result Date: 08/22/2023 CLINICAL DATA:  Free air on chest CT earlier today. Review of radiologic records indicates robotic cholecystectomy 08/11/2023. EXAM: CT ABDOMEN AND PELVIS WITH CONTRAST TECHNIQUE: Multidetector CT imaging of the abdomen and pelvis was performed using the standard protocol following bolus administration of intravenous contrast. RADIATION DOSE REDUCTION: This exam was performed according to the departmental dose-optimization program which includes automated exposure control, adjustment of the mA and/or kV according to patient size and/or use of iterative reconstruction technique. CONTRAST:  80mL OMNIPAQUE IOHEXOL 300 MG/ML  SOLN COMPARISON:  CT 07/25/2023 FINDINGS: Lower chest: Assessed on chest CT earlier today. Hepatobiliary: No suspicious hepatic lesion. Cholecystectomy without fluid collection in the cholecystectomy bed. Expected postsurgical stranding. No biliary dilatation. Pancreas: No ductal dilatation or inflammation. Spleen: Normal in size without focal abnormality. Adrenals/Urinary Tract: Left nephrectomy. Unchanged 7 mm soft tissue density adjacent to the nephrectomy suture choose 2 image 34. Left adrenal gland is not seen. Normal right adrenal gland. No right hydronephrosis. The right renal stone on prior exam is obscured by excreted IV contrast in the right renal collecting system. Right ureters normally opacified with excreted contrast. Unremarkable urinary bladder. Stomach/Bowel: Unremarkable appearance of the  stomach, no gastric wall thickening or perigastric inflammation. There is no small bowel dilatation, wall thickening or inflammatory change. Scattered fluid within distal small bowel, not abnormally distended. Moderate volume of stool in the colon. No colonic wall thickening or inflammation. Occasional colonic diverticula without diverticulitis. Normal appendix. Vascular/Lymphatic: Normal caliber abdominal aorta, mild atherosclerosis. 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 no significant change from prior. The portal vein is patent. No enlarged lymph nodes in the abdomen or pelvis. Reproductive: Prostate is unremarkable. Other: Moderate volume of free air throughout the anterior abdomen and omentum. No focal fluid collection. Postsurgical change in the anterior abdominal wall Musculoskeletal: There are no acute osseous abnormalities. Stable appearance of lucent lesion in the right proximal femur. IMPRESSION: 1. Moderate volume of free air throughout the anterior abdomen and omentum. No evidence of bowel inflammation or perforation. Free air may be related to recent cholecystectomy, although greater than  typically seen for 11 days postop. 2. Cholecystectomy without fluid collection in the cholecystectomy bed. 3. Left nephrectomy. Unchanged 7 mm soft tissue density adjacent to the nephrectomy suture. 4. 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 No significant change from prior. These results were called by telephone at the time of interpretation on 08/22/2023 at 7:59 pm to provider DAVID WELLS , who verbally acknowledged these results. Electronically Signed   By: Narda Rutherford M.D.   On: 08/22/2023 19:59   DG Chest 2 View Addendum Date: 08/22/2023 ADDENDUM REPORT: 08/22/2023 17:24 ADDENDUM: Findings/Impression: Intraperitoneal free air is noted beneath the diaphragm. Further evaluation with dedicated  CT abdomen/pelvis is recommended. These results were called by telephone at the time of interpretation on 08/22/2023 at 5:16 pm to provider Willeen Cass, who verbally acknowledged these results. Electronically Signed   By: Hart Robinsons M.D.   On: 08/22/2023 17:24   Result Date: 08/22/2023 CLINICAL DATA:  . EXAM: CHEST - 2 VIEW COMPARISON:  Chest radiograph dated 05/15/2023. CT chest dated 08/25/2023. FINDINGS: The heart size and mediastinal contours are within normal limits. Right chest Port-A-Cath tip overlies the superior cavoatrial junction. No focal consolidation, pleural effusion, or pneumothorax. No acute osseous abnormality. IMPRESSION: No acute cardiopulmonary findings. Electronically Signed: By: Hart Robinsons M.D. On: 08/22/2023 16:56   CT Angio Chest PE W and/or Wo Contrast Result Date: 08/22/2023 CLINICAL DATA:  Pulmonary embolism (PE) suspected, high prob. History of left renal cell carcinoma status post nephrectomy. EXAM: CT ANGIOGRAPHY CHEST WITH CONTRAST TECHNIQUE: Multidetector CT imaging of the chest was performed using the standard protocol during bolus administration of intravenous contrast. Multiplanar CT image reconstructions and MIPs were obtained to evaluate the vascular anatomy. RADIATION DOSE REDUCTION: This exam was performed according to the departmental dose-optimization program which includes automated exposure control, adjustment of the mA and/or kV according to patient size and/or use of iterative reconstruction technique. CONTRAST:  75mL OMNIPAQUE IOHEXOL 350 MG/ML SOLN COMPARISON:  CT chest, abdomen, and pelvis dated 07/25/2023. CTA chest dated 05/15/2023. FINDINGS: Cardiovascular: Satisfactory opacification of the pulmonary arteries to the segmental level. No evidence of pulmonary embolism. Normal heart size. No pericardial effusion. Nonaneurysmal aorta. Trace atherosclerotic calcification at the aortic arch. Right-sided Port-A-Cath tip in the right atrium.  Mediastinum/Nodes: No enlarged mediastinal, hilar, or axillary lymph nodes. Thyroid gland, trachea, and esophagus demonstrate no significant findings. Lungs/Pleura: Mild biapical pleural-parenchymal scarring. No focal consolidation, pleural effusion, or pneumothorax. No suspicious pulmonary nodule. Upper Abdomen: Intraperitoneal free air is noted underlying the nondependent aspect of the ventral abdominal wall and anterior to the liver. Status post left nephrectomy and adrenalectomy. Stable nonobstructing calculus in the inferior pole of the right kidney. Musculoskeletal: No acute osseous abnormality. No suspicious osseous lesion. Review of the MIP images confirms the above findings. IMPRESSION: 1. No evidence of pulmonary embolism. 2. No acute intrathoracic findings. 3. Partially visualized intraperitoneal free air underlying the nondependent aspect of the ventral abdominal wall and anterior to the liver. Further evaluation with dedicated CT abdomen/pelvis is recommended. These results were called by telephone at the time of interpretation on 08/22/2023 at 5:16 pm to provider Willeen Cass, who verbally acknowledged these results. Electronically Signed   By: Hart Robinsons M.D.   On: 08/22/2023 17:23    Assessment & Plan:  Controlled type 2 diabetes mellitus without complication, without long-term current use of insulin (HCC) -     POCT glycosylated hemoglobin (Hb A1C) -  POCT glucose (manual entry)  Immunization due -     Pneumococcal conjugate vaccine 20-valent     Follow-up: Return in about 6 months (around 02/22/2024).  Sanda Linger, MD

## 2023-08-25 NOTE — Patient Instructions (Signed)

## 2023-08-26 ENCOUNTER — Telehealth: Payer: Self-pay | Admitting: Internal Medicine

## 2023-08-26 ENCOUNTER — Encounter: Payer: Self-pay | Admitting: Internal Medicine

## 2023-08-26 ENCOUNTER — Other Ambulatory Visit (HOSPITAL_COMMUNITY): Payer: Self-pay

## 2023-08-26 NOTE — Telephone Encounter (Signed)
Copied from CRM [id number]. Topic: Clinical - Medication Question >> Aug 26, 2023  1:46 PM [place] J wrote: Reason for CRM: Zella Ball calling in from [facility] wanting to get a status update on the prior authorization for Ferric Maltol (ACCRUFER) 30 MG. Please call back at [phone]

## 2023-08-27 ENCOUNTER — Other Ambulatory Visit: Payer: Self-pay | Admitting: Internal Medicine

## 2023-08-29 ENCOUNTER — Other Ambulatory Visit: Payer: Self-pay | Admitting: Internal Medicine

## 2023-08-29 DIAGNOSIS — D508 Other iron deficiency anemias: Secondary | ICD-10-CM

## 2023-08-29 MED ORDER — ACCRUFER 30 MG PO CAPS: 1.0000 | ORAL_CAPSULE | Freq: Two times a day (BID) | ORAL | 0 refills | Status: DC

## 2023-08-29 NOTE — Telephone Encounter (Signed)
 We do not see this medication on his med list nor do we see in the note where he's supposed to take it. If he is supposed to be on this medication can you write the script and send it to [facility] so that we can process the PA.

## 2023-09-05 ENCOUNTER — Encounter: Payer: Self-pay | Admitting: Internal Medicine

## 2023-09-05 ENCOUNTER — Inpatient Hospital Stay

## 2023-09-05 ENCOUNTER — Telehealth: Payer: Self-pay | Admitting: Pharmacist

## 2023-09-05 ENCOUNTER — Telehealth: Payer: Self-pay | Admitting: Pharmacy Technician

## 2023-09-05 ENCOUNTER — Inpatient Hospital Stay (HOSPITAL_BASED_OUTPATIENT_CLINIC_OR_DEPARTMENT_OTHER): Payer: Federal, State, Local not specified - PPO | Admitting: Internal Medicine

## 2023-09-05 ENCOUNTER — Inpatient Hospital Stay: Payer: Federal, State, Local not specified - PPO | Attending: Internal Medicine

## 2023-09-05 ENCOUNTER — Other Ambulatory Visit (HOSPITAL_COMMUNITY): Payer: Self-pay

## 2023-09-05 DIAGNOSIS — K529 Noninfective gastroenteritis and colitis, unspecified: Secondary | ICD-10-CM | POA: Diagnosis not present

## 2023-09-05 DIAGNOSIS — Z86711 Personal history of pulmonary embolism: Secondary | ICD-10-CM | POA: Diagnosis not present

## 2023-09-05 DIAGNOSIS — Z923 Personal history of irradiation: Secondary | ICD-10-CM | POA: Insufficient documentation

## 2023-09-05 DIAGNOSIS — C642 Malignant neoplasm of left kidney, except renal pelvis: Secondary | ICD-10-CM

## 2023-09-05 DIAGNOSIS — Z79899 Other long term (current) drug therapy: Secondary | ICD-10-CM | POA: Diagnosis not present

## 2023-09-05 DIAGNOSIS — R911 Solitary pulmonary nodule: Secondary | ICD-10-CM | POA: Insufficient documentation

## 2023-09-05 DIAGNOSIS — C7951 Secondary malignant neoplasm of bone: Secondary | ICD-10-CM | POA: Diagnosis not present

## 2023-09-05 DIAGNOSIS — D509 Iron deficiency anemia, unspecified: Secondary | ICD-10-CM | POA: Insufficient documentation

## 2023-09-05 DIAGNOSIS — Z905 Acquired absence of kidney: Secondary | ICD-10-CM | POA: Diagnosis not present

## 2023-09-05 DIAGNOSIS — Z7901 Long term (current) use of anticoagulants: Secondary | ICD-10-CM | POA: Insufficient documentation

## 2023-09-05 LAB — CMP (CANCER CENTER ONLY)
ALT: 20 U/L (ref 0–44)
AST: 19 U/L (ref 15–41)
Albumin: 4.3 g/dL (ref 3.5–5.0)
Alkaline Phosphatase: 76 U/L (ref 38–126)
Anion gap: 12 (ref 5–15)
BUN: 8 mg/dL (ref 6–20)
CO2: 25 mmol/L (ref 22–32)
Calcium: 9.9 mg/dL (ref 8.9–10.3)
Chloride: 103 mmol/L (ref 98–111)
Creatinine: 0.9 mg/dL (ref 0.61–1.24)
GFR, Estimated: >60 mL/min
Glucose, Bld: 112 mg/dL — ABNORMAL HIGH (ref 70–99)
Potassium: 4 mmol/L (ref 3.5–5.1)
Sodium: 140 mmol/L (ref 135–145)
Total Bilirubin: 1.3 mg/dL — ABNORMAL HIGH (ref 0.0–1.2)
Total Protein: 8.1 g/dL (ref 6.5–8.1)

## 2023-09-05 LAB — CBC WITH DIFFERENTIAL (CANCER CENTER ONLY)
Abs Immature Granulocytes: 0.03 10*3/uL (ref 0.00–0.07)
Basophils Absolute: 0.1 10*3/uL (ref 0.0–0.1)
Basophils Relative: 1 %
Eosinophils Absolute: 1.8 10*3/uL — ABNORMAL HIGH (ref 0.0–0.5)
Eosinophils Relative: 25 %
HCT: 41.1 % (ref 39.0–52.0)
Hemoglobin: 13.1 g/dL (ref 13.0–17.0)
Immature Granulocytes: 0 %
Lymphocytes Relative: 22 %
Lymphs Abs: 1.6 10*3/uL (ref 0.7–4.0)
MCH: 29.1 pg (ref 26.0–34.0)
MCHC: 31.9 g/dL (ref 30.0–36.0)
MCV: 91.3 fL (ref 80.0–100.0)
Monocytes Absolute: 0.7 10*3/uL (ref 0.1–1.0)
Monocytes Relative: 9 %
Neutro Abs: 3.2 10*3/uL (ref 1.7–7.7)
Neutrophils Relative %: 43 %
Platelet Count: 343 10*3/uL (ref 150–400)
RBC: 4.5 MIL/uL (ref 4.22–5.81)
RDW: 12.6 % (ref 11.5–15.5)
WBC Count: 7.5 10*3/uL (ref 4.0–10.5)
nRBC: 0 % (ref 0.0–0.2)

## 2023-09-05 LAB — PROTEIN / CREATININE RATIO, URINE
Creatinine, Urine: 145 mg/dL
Protein Creatinine Ratio: 0.18 mg/mg{creat} — ABNORMAL HIGH (ref 0.00–0.15)
Total Protein, Urine: 26 mg/dL

## 2023-09-05 MED ORDER — CABOMETYX 40 MG PO TABS: 40.0000 mg | ORAL_TABLET | Freq: Every day | ORAL | 0 refills | Status: DC

## 2023-09-05 NOTE — Telephone Encounter (Signed)
 Oral Oncology Patient Advocate Encounter   Received notification that prior authorization for Cabometyx is required.   PA submitted on 09/05/2023 Key [id number] Status is pending     [name] Almedia Balls, CPhT Oncology Pharmacy Patient Advocate [facility] Direct Number: [phone] Fax: [phone]

## 2023-09-05 NOTE — Telephone Encounter (Signed)
 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  CMP from 09/05/23 assessed, no relevant lab abnormalities. Prescription dose and frequency assessed.   Current medication list in Epic reviewed, one DDIs with cabozantinib identified: Apixaban: Apixaban may enhance the adverse/toxic effect of Cabozantinib. Monitor for signs of thrombocytopenia and neutropenia.  Evaluated chart and no patient barriers to medication adherence identified.   Prescription has been e-scribed to the [facility] for benefits analysis and approval.  Oral Oncology Clinic will continue to follow for insurance authorization, copayment issues, initial counseling and start date.   Remi Haggard, PharmD, BCOP, CPP Hematology/Oncology Clinical Pharmacist [facility] Oral Chemotherapy Navigation [facility] [phone]  09/05/2023 3:23 PM

## 2023-09-05 NOTE — Progress Notes (Signed)
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 telephone at the time of interpretation on 08/22/2023 at 5:16 pm to provider Willeen Cass, who verbally acknowledged these results. Electronically Signed   By: Hart Robinsons M.D.   On: 08/22/2023 17:23

## 2023-09-05 NOTE — Telephone Encounter (Addendum)
 Oral Oncology Patient Advocate Encounter  Prior Authorization for Cabometyx has been approved.    PA# [id number] Effective dates: 09/05/2023 through 09/04/2024  Patients co-pay is $120.   [facility]acy Phone number:[phone] Pharmacy Fax number:[phone]  Script and supporting documents have been sent to [facility] via fax.   [name] Almedia Balls, CPhT Oncology Pharmacy Patient Advocate [facility] Direct Number: [phone] Fax: [phone]

## 2023-09-05 NOTE — Progress Notes (Signed)
 Patient is having some dry mouth and his tongue is really sore, he wants to know if he should see an ENT doctor for that? He would like another prescription for the magic mouth wash.

## 2023-09-07 ENCOUNTER — Telehealth: Payer: Self-pay | Admitting: Pharmacy Technician

## 2023-09-07 ENCOUNTER — Encounter
Admission: RE | Admit: 2023-09-07 | Discharge: 2023-09-07 | Disposition: A | Discharge status: Home / Self Care | Source: Ambulatory Visit | Attending: Internal Medicine | Admitting: Internal Medicine

## 2023-09-07 ENCOUNTER — Inpatient Hospital Stay: Admitting: Pharmacist

## 2023-09-07 DIAGNOSIS — C642 Malignant neoplasm of left kidney, except renal pelvis: Secondary | ICD-10-CM

## 2023-09-07 DIAGNOSIS — Z86711 Personal history of pulmonary embolism: Secondary | ICD-10-CM | POA: Diagnosis not present

## 2023-09-07 DIAGNOSIS — C7951 Secondary malignant neoplasm of bone: Secondary | ICD-10-CM | POA: Insufficient documentation

## 2023-09-07 DIAGNOSIS — Z79899 Other long term (current) drug therapy: Secondary | ICD-10-CM | POA: Diagnosis not present

## 2023-09-07 DIAGNOSIS — Z923 Personal history of irradiation: Secondary | ICD-10-CM | POA: Diagnosis not present

## 2023-09-07 DIAGNOSIS — D509 Iron deficiency anemia, unspecified: Secondary | ICD-10-CM | POA: Diagnosis not present

## 2023-09-07 DIAGNOSIS — K529 Noninfective gastroenteritis and colitis, unspecified: Secondary | ICD-10-CM | POA: Diagnosis not present

## 2023-09-07 DIAGNOSIS — Z7901 Long term (current) use of anticoagulants: Secondary | ICD-10-CM | POA: Diagnosis not present

## 2023-09-07 DIAGNOSIS — R911 Solitary pulmonary nodule: Secondary | ICD-10-CM | POA: Diagnosis not present

## 2023-09-07 DIAGNOSIS — Z905 Acquired absence of kidney: Secondary | ICD-10-CM | POA: Diagnosis not present

## 2023-09-07 DIAGNOSIS — C649 Malignant neoplasm of unspecified kidney, except renal pelvis: Secondary | ICD-10-CM | POA: Diagnosis not present

## 2023-09-07 MED ORDER — TECHNETIUM TC 99M MEDRONATE IV KIT
20.0000 | PACK | Freq: Once | INTRAVENOUS | Status: AC | PRN
  Administered 2023-09-07: 21.84 via INTRAVENOUS
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
## 2023-09-07 NOTE — Telephone Encounter (Signed)
 Oral Oncology Patient Advocate Encounter  [facility] to give them co-pay card info for Cabometyx.  I have spoken with the pt and advised him to [facility] to set [facility] delivery/pick-up. Pt expressed understanding.  I will check back in with [facility] to confirm pt has received medication.  [name] Almedia Balls, CPhT Oncology Pharmacy Patient Advocate [facility] Direct Number: [phone] Fax: [phone]

## 2023-09-08 ENCOUNTER — Other Ambulatory Visit: Payer: Self-pay | Admitting: Internal Medicine

## 2023-09-13 ENCOUNTER — Inpatient Hospital Stay: Payer: Federal, State, Local not specified - PPO

## 2023-09-13 NOTE — Progress Notes (Signed)
 Nutrition Follow-up:  Patient with stage IV, left clear cell RCC.  S/p radical nephrectomy and adrenalectomy.  Planning to start carbozantinib on 3/13.    Spoke with patient via phone.  Reports that his appetite is better after gallbladder surgery.  Foods likes chicken pot pies and spaghetti casserole are working well for him.  Has not started drinking shakes again.  Can't tolerate foods that are spicy.  Says that he is feeling pretty good.     Medications: reviewed  Labs: reviewed  Anthropometrics:   Weight 140 lb 3.2 oz   143 lb on 2/17 140 lb on 1/20 138 lb on 12/13  NUTRITION DIAGNOSIS: Inadequate oral intake improving   INTERVENTION:  Discussed low sugar shake examples (premier protein, ensure max protein, boost max protein, glucerna) Continue high calorie, high protein foods    MONITORING, EVALUATION, GOAL: weight trends, intake   NEXT VISIT: Thursday, April 3rd following MD visit  Marabella Popiel B. Freida Busman, RD, LDN Registered Dietitian [phone]

## 2023-09-14 NOTE — Telephone Encounter (Signed)
 Patient reports his medication will be delivered on 09/15/23

## 2023-09-16 ENCOUNTER — Other Ambulatory Visit: Payer: Self-pay

## 2023-09-16 ENCOUNTER — Encounter: Payer: Self-pay | Admitting: Internal Medicine

## 2023-09-16 ENCOUNTER — Other Ambulatory Visit: Payer: Self-pay | Admitting: *Deleted

## 2023-09-16 MED ORDER — MAGIC MOUTHWASH W/LIDOCAINE: 5.0000 mL | Freq: Four times a day (QID) | ORAL | 3 refills | Status: DC

## 2023-09-16 MED ORDER — STERILE WATER FOR INJECTION IJ SOLN
5.0000 mL | Freq: Four times a day (QID) | ORAL | 3 refills | Status: DC | PRN
Start: 2023-09-16 — End: 2023-09-26
  Filled 2023-09-16: qty 480, 12d supply, fill #0

## 2023-09-19 ENCOUNTER — Inpatient Hospital Stay

## 2023-09-19 DIAGNOSIS — K529 Noninfective gastroenteritis and colitis, unspecified: Secondary | ICD-10-CM | POA: Diagnosis not present

## 2023-09-19 DIAGNOSIS — Z905 Acquired absence of kidney: Secondary | ICD-10-CM | POA: Diagnosis not present

## 2023-09-19 DIAGNOSIS — C642 Malignant neoplasm of left kidney, except renal pelvis: Secondary | ICD-10-CM | POA: Diagnosis not present

## 2023-09-19 DIAGNOSIS — C7951 Secondary malignant neoplasm of bone: Secondary | ICD-10-CM

## 2023-09-19 DIAGNOSIS — Z86711 Personal history of pulmonary embolism: Secondary | ICD-10-CM | POA: Diagnosis not present

## 2023-09-19 DIAGNOSIS — Z923 Personal history of irradiation: Secondary | ICD-10-CM | POA: Diagnosis not present

## 2023-09-19 DIAGNOSIS — R911 Solitary pulmonary nodule: Secondary | ICD-10-CM | POA: Diagnosis not present

## 2023-09-19 DIAGNOSIS — Z79899 Other long term (current) drug therapy: Secondary | ICD-10-CM | POA: Diagnosis not present

## 2023-09-19 DIAGNOSIS — Z7901 Long term (current) use of anticoagulants: Secondary | ICD-10-CM | POA: Diagnosis not present

## 2023-09-19 DIAGNOSIS — D509 Iron deficiency anemia, unspecified: Secondary | ICD-10-CM | POA: Diagnosis not present

## 2023-09-19 LAB — CBC WITH DIFFERENTIAL/PLATELET
Abs Immature Granulocytes: 0.02 10*3/uL (ref 0.00–0.07)
Basophils Absolute: 0 10*3/uL (ref 0.0–0.1)
Basophils Relative: 1 %
Eosinophils Absolute: 0.5 10*3/uL (ref 0.0–0.5)
Eosinophils Relative: 10 %
HCT: 37 % — ABNORMAL LOW (ref 39.0–52.0)
Hemoglobin: 12.1 g/dL — ABNORMAL LOW (ref 13.0–17.0)
Immature Granulocytes: 0 %
Lymphocytes Relative: 27 %
Lymphs Abs: 1.4 10*3/uL (ref 0.7–4.0)
MCH: 29.2 pg (ref 26.0–34.0)
MCHC: 32.7 g/dL (ref 30.0–36.0)
MCV: 89.2 fL (ref 80.0–100.0)
Monocytes Absolute: 0.6 10*3/uL (ref 0.1–1.0)
Monocytes Relative: 10 %
Neutro Abs: 2.8 10*3/uL (ref 1.7–7.7)
Neutrophils Relative %: 52 %
Platelets: 234 10*3/uL (ref 150–400)
RBC: 4.15 MIL/uL — ABNORMAL LOW (ref 4.22–5.81)
RDW: 12.3 % (ref 11.5–15.5)
WBC: 5.4 10*3/uL (ref 4.0–10.5)
nRBC: 0 % (ref 0.0–0.2)

## 2023-09-19 LAB — COMPREHENSIVE METABOLIC PANEL
ALT: 23 U/L (ref 0–44)
AST: 29 U/L (ref 15–41)
Albumin: 3.8 g/dL (ref 3.5–5.0)
Alkaline Phosphatase: 67 U/L (ref 38–126)
Anion gap: 10 (ref 5–15)
BUN: 8 mg/dL (ref 6–20)
CO2: 25 mmol/L (ref 22–32)
Calcium: 8.5 mg/dL — ABNORMAL LOW (ref 8.9–10.3)
Chloride: 102 mmol/L (ref 98–111)
Creatinine, Ser: 1.01 mg/dL (ref 0.61–1.24)
GFR, Estimated: >60 mL/min (ref >60)
Glucose, Bld: 183 mg/dL — ABNORMAL HIGH (ref 70–99)
Potassium: 3.3 mmol/L — ABNORMAL LOW (ref 3.5–5.1)
Sodium: 137 mmol/L (ref 135–145)
Total Bilirubin: 0.7 mg/dL (ref 0.0–1.2)
Total Protein: 7.1 g/dL (ref 6.5–8.1)

## 2023-09-21 ENCOUNTER — Encounter: Payer: Self-pay | Admitting: Surgery

## 2023-09-22 ENCOUNTER — Other Ambulatory Visit: Payer: Self-pay | Admitting: Internal Medicine

## 2023-09-22 ENCOUNTER — Other Ambulatory Visit

## 2023-09-22 ENCOUNTER — Ambulatory Visit: Admitting: Pharmacist

## 2023-09-22 DIAGNOSIS — C642 Malignant neoplasm of left kidney, except renal pelvis: Secondary | ICD-10-CM

## 2023-09-25 ENCOUNTER — Other Ambulatory Visit: Payer: Self-pay | Admitting: Internal Medicine

## 2023-09-26 ENCOUNTER — Other Ambulatory Visit: Payer: Self-pay | Admitting: *Deleted

## 2023-09-26 ENCOUNTER — Telehealth: Payer: Self-pay

## 2023-09-26 ENCOUNTER — Encounter: Payer: Self-pay | Admitting: Cardiovascular Disease

## 2023-09-26 ENCOUNTER — Other Ambulatory Visit: Payer: Self-pay

## 2023-09-26 ENCOUNTER — Other Ambulatory Visit (HOSPITAL_COMMUNITY): Payer: Self-pay

## 2023-09-26 DIAGNOSIS — H15103 Unspecified episcleritis, bilateral: Secondary | ICD-10-CM | POA: Diagnosis not present

## 2023-09-26 DIAGNOSIS — H209 Unspecified iridocyclitis: Secondary | ICD-10-CM | POA: Diagnosis not present

## 2023-09-26 MED ORDER — STERILE WATER FOR INJECTION IJ SOLN
5.0000 mL | Freq: Four times a day (QID) | ORAL | 3 refills | Status: DC | PRN
Start: 2023-09-26 — End: 2023-11-18

## 2023-09-26 MED ORDER — STERILE WATER FOR INJECTION IJ SOLN
5.0000 mL | Freq: Four times a day (QID) | ORAL | 3 refills | Status: DC | PRN
Start: 2023-09-26 — End: 2023-10-13
  Filled 2023-09-26: qty 480, 12d supply, fill #0

## 2023-09-26 NOTE — Telephone Encounter (Signed)
 Tried calling the patient in

## 2023-09-27 ENCOUNTER — Encounter: Payer: Self-pay | Admitting: Internal Medicine

## 2023-09-27 MED ORDER — METOPROLOL SUCCINATE ER 25 MG PO TB24: 25.0000 mg | ORAL_TABLET | Freq: Every day | ORAL | 3 refills | Status: AC

## 2023-09-27 NOTE — Telephone Encounter (Signed)
25 mg once daily

## 2023-09-27 NOTE — Telephone Encounter (Signed)
 I think we can switch him back to the original generic metoprolol succinate

## 2023-09-27 NOTE — Telephone Encounter (Signed)
 Tried calling patient to relay the following message for Dr. Alena Bills. I understand the intolerance has restarted after Cabometyx. Because it is getting unbearable, he can consider holding it and see if symptoms improve.

## 2023-09-27 NOTE — Addendum Note (Signed)
 Addended by: Morrell Riddle on: 09/27/2023 05:59 PM   Modules accepted: Orders

## 2023-09-29 ENCOUNTER — Encounter: Payer: Self-pay | Admitting: Pharmacist

## 2023-09-29 ENCOUNTER — Other Ambulatory Visit: Payer: Self-pay | Admitting: Pharmacist

## 2023-09-29 ENCOUNTER — Inpatient Hospital Stay: Admitting: Pharmacist

## 2023-09-29 ENCOUNTER — Inpatient Hospital Stay

## 2023-09-29 VITALS — BP 130/80

## 2023-09-29 DIAGNOSIS — Z7901 Long term (current) use of anticoagulants: Secondary | ICD-10-CM | POA: Diagnosis not present

## 2023-09-29 DIAGNOSIS — D509 Iron deficiency anemia, unspecified: Secondary | ICD-10-CM | POA: Diagnosis not present

## 2023-09-29 DIAGNOSIS — K529 Noninfective gastroenteritis and colitis, unspecified: Secondary | ICD-10-CM | POA: Diagnosis not present

## 2023-09-29 DIAGNOSIS — Z79899 Other long term (current) drug therapy: Secondary | ICD-10-CM | POA: Diagnosis not present

## 2023-09-29 DIAGNOSIS — C642 Malignant neoplasm of left kidney, except renal pelvis: Secondary | ICD-10-CM

## 2023-09-29 DIAGNOSIS — R911 Solitary pulmonary nodule: Secondary | ICD-10-CM | POA: Diagnosis not present

## 2023-09-29 DIAGNOSIS — Z905 Acquired absence of kidney: Secondary | ICD-10-CM | POA: Diagnosis not present

## 2023-09-29 DIAGNOSIS — C7951 Secondary malignant neoplasm of bone: Secondary | ICD-10-CM | POA: Diagnosis not present

## 2023-09-29 DIAGNOSIS — Z923 Personal history of irradiation: Secondary | ICD-10-CM | POA: Diagnosis not present

## 2023-09-29 DIAGNOSIS — Z86711 Personal history of pulmonary embolism: Secondary | ICD-10-CM | POA: Diagnosis not present

## 2023-09-29 LAB — COMPREHENSIVE METABOLIC PANEL WITH GFR
ALT: 17 U/L (ref 0–44)
AST: 24 U/L (ref 15–41)
Albumin: 4 g/dL (ref 3.5–5.0)
Alkaline Phosphatase: 91 U/L (ref 38–126)
Anion gap: 13 (ref 5–15)
BUN: 8 mg/dL (ref 6–20)
CO2: 26 mmol/L (ref 22–32)
Calcium: 8.9 mg/dL (ref 8.9–10.3)
Chloride: 101 mmol/L (ref 98–111)
Creatinine, Ser: 0.92 mg/dL (ref 0.61–1.24)
GFR, Estimated: >60 mL/min (ref >60)
Glucose, Bld: 123 mg/dL — ABNORMAL HIGH (ref 70–99)
Potassium: 4 mmol/L (ref 3.5–5.1)
Sodium: 140 mmol/L (ref 135–145)
Total Bilirubin: 1.2 mg/dL (ref 0.0–1.2)
Total Protein: 7.6 g/dL (ref 6.5–8.1)

## 2023-09-29 LAB — CBC WITH DIFFERENTIAL/PLATELET
Abs Immature Granulocytes: 0.02 10*3/uL (ref 0.00–0.07)
Basophils Absolute: 0 10*3/uL (ref 0.0–0.1)
Basophils Relative: 1 %
Eosinophils Absolute: 0.1 10*3/uL (ref 0.0–0.5)
Eosinophils Relative: 2 %
HCT: 43.3 % (ref 39.0–52.0)
Hemoglobin: 14.1 g/dL (ref 13.0–17.0)
Immature Granulocytes: 0 %
Lymphocytes Relative: 24 %
Lymphs Abs: 1.1 10*3/uL (ref 0.7–4.0)
MCH: 29 pg (ref 26.0–34.0)
MCHC: 32.6 g/dL (ref 30.0–36.0)
MCV: 89.1 fL (ref 80.0–100.0)
Monocytes Absolute: 0.4 10*3/uL (ref 0.1–1.0)
Monocytes Relative: 8 %
Neutro Abs: 3 10*3/uL (ref 1.7–7.7)
Neutrophils Relative %: 65 %
Platelets: 262 10*3/uL (ref 150–400)
RBC: 4.86 MIL/uL (ref 4.22–5.81)
RDW: 12.8 % (ref 11.5–15.5)
WBC: 4.6 10*3/uL (ref 4.0–10.5)
nRBC: 0 % (ref 0.0–0.2)

## 2023-09-29 MED ORDER — DEXAMETHASONE 0.5 MG/5ML PO SOLN: ORAL | 1 refills | Status: DC
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
## 2023-09-29 NOTE — Progress Notes (Signed)
 Patient is having severe mouth sores and irritation. So he has stopped taking it starting about 2 days ago. His mouth hasn't been as intense since stopping the medication. He would like to start seeing Dr. Donneta Romberg after Dr. Alena Bills leaves.

## 2023-10-03 ENCOUNTER — Encounter: Payer: Self-pay | Admitting: Internal Medicine

## 2023-10-03 ENCOUNTER — Ambulatory Visit: Payer: Self-pay | Admitting: *Deleted

## 2023-10-03 ENCOUNTER — Telehealth: Payer: Self-pay | Admitting: *Deleted

## 2023-10-03 NOTE — Telephone Encounter (Addendum)
 I spoke to the [facility] and she says that the cabometyx does not usually have issue of big toes the pt. States that it is a little better right now and it is the both big toes. He is ok if he can get appt for tom. About the pain.

## 2023-10-03 NOTE — Telephone Encounter (Signed)
 Copied from CRM [id number]. Topic: Clinical - Red Word Triage >> Oct 03, 2023 10:36 AM [place] J wrote: [facility] that prompted transfer to Nurse Triage: Bad reaction to medication (CABOMETYX 40 MG) severe feet pain. Reason for Disposition  [1] Caller has URGENT medicine question about med that PCP or specialist prescribed AND [2] triager unable to answer question    Pt is going to call his cancer doctor since they are the ones who prescribed this for his kidney cancer.  Answer Assessment - Initial Assessment Questions 1. NAME of MEDICINE: "What medicine(s) are you calling about?"     Cabometyx 40 mg I have stage 4 renal cell cancer.  My cancer dr put me on this.   I was off of it for 6 months.   No issues.   Bone scan was good.  I'm taking it as a preventative but it's causing more side effects than is beneficial now that I'm back on it.    I did talk with the nurse at the cancer center on 09/29/2023  about this.     I didn't actually see my cancer dr that day.    I told them about these effects.    April 10 is my next visit.    I didn't know if I should call my cancer dr or my PCP for this.   I suggested he call his cancer dr since they are the ones that prescribed this medication.   I can call my cancer dr and let them know about these side effects.  He was agreeable to calling them.    I let him know if for some reason that did not work out to please call us back but usually the doctor that prescribes the medication is the best one to call regarding problems with it.     2. QUESTION: "What is your question?" (e.g., double dose of medicine, side effect)     This medicine is causing my feet to hurt so bad.   Thur. On right foot the pain started and on Sat the left is hurting too.   It looks like blisters on right big toe.    I started the medicine 2 weeks ago.  I had been off of it but they restarted it.    I stopped taking it 3/24 though because of the side effects.       The side effect is hand and  foot itching is what they told me with this medication but the pain is terrible now. I've never had pain to the feet like this before. 3. PRESCRIBER: "Who prescribed the medicine?" Reason: if prescribed by specialist, call should be referred to that group.     My cancer doctor 4. SYMPTOMS: "Do you have any symptoms?" If Yes, ask: "What symptoms are you having?"  "How bad are the symptoms (e.g., mild, moderate, severe)     My feet are hurting terrible since starting it.   It's a side effect of the Cabometyx. 5. PREGNANCY:  "Is there any chance that you are pregnant?" "When was your last menstrual period?"     N/A  Protocols used: Medication Question Call-A-AH  Chief Complaint: Having side effects from the Cabometyx 40 mg prescribed by his cancer doctor for kidney cancer. Symptoms: Pain in both of his feet.   Told side effects of the medication were itching and discomfort in my hands and feet but the pain is terrible.   I didn't know whether to call my  PCP or my cancer dr.     I advised him to call his cancer doctor since they are the ones who prescribed this.   He was agreeable to doing this. Frequency: Daily since last Thur. One foot and Sat for the other foot. Pertinent Negatives: Patient denies taking the medicine now.   He stopped it on 09/26/2023 due to the side effects of foot pain. Disposition: [] ED /[] Urgent Care (no appt availability in office) / [] Appointment(In office/virtual)/ []  [facility]/ [] Home Care/ [] Refused Recommended Disposition /[]  Mobile Bus/ [x]  Follow-up with PCP Additional Notes: Pt agreeable to calling his cancer doctor regarding the side effects from this medication they prescribed for him.   I let him know to call us back if that did not work out but usually the doctor that prescribed the medication is the best one to contact.  Pt agreeable to calling his cancer doctor.

## 2023-10-03 NOTE — Telephone Encounter (Signed)
 With the med

## 2023-10-03 NOTE — Telephone Encounter (Signed)
 Pt states that it is pain and he can take oxycodone but that is not going to stop it, just helps a little bit and is not the answer to get it better. He wants something else to give him that can help more. I asked Alyson to see if she can help and if not I will sent it to the MD on call because Alena Bills is not here today.

## 2023-10-04 ENCOUNTER — Telehealth: Payer: Self-pay | Admitting: Nurse Practitioner

## 2023-10-04 DIAGNOSIS — Z905 Acquired absence of kidney: Secondary | ICD-10-CM | POA: Diagnosis not present

## 2023-10-04 DIAGNOSIS — N200 Calculus of kidney: Secondary | ICD-10-CM | POA: Diagnosis not present

## 2023-10-04 DIAGNOSIS — C778 Secondary and unspecified malignant neoplasm of lymph nodes of multiple regions: Secondary | ICD-10-CM | POA: Diagnosis not present

## 2023-10-04 DIAGNOSIS — C642 Malignant neoplasm of left kidney, except renal pelvis: Secondary | ICD-10-CM | POA: Diagnosis not present

## 2023-10-04 NOTE — Addendum Note (Signed)
 Encounter addended by: Dagoberto Reef on: 10/04/2023 9:32 AM  Actions taken: Imaging Exam begun

## 2023-10-04 NOTE — Telephone Encounter (Signed)
 Spoke to patient by phone. Complains that he developed mouth pain last Tuesday and subsequently stopped cabometyx at that time. Two days later he developed redness, discomfort, and blisters of feet and hands. Offered in person [facility] visit to evaluate. He will send [employer]. Prefers to hold cabometyx until his visit with Dr Alena Bills next week.

## 2023-10-04 NOTE — Addendum Note (Signed)
 Encounter addended by: Dagoberto Reef on: 10/04/2023 9:32 AM  Actions taken: Imaging Exam ended, Charge Capture section accepted

## 2023-10-05 ENCOUNTER — Encounter: Payer: Self-pay | Admitting: Internal Medicine

## 2023-10-06 ENCOUNTER — Other Ambulatory Visit

## 2023-10-06 ENCOUNTER — Telehealth: Payer: Self-pay

## 2023-10-06 ENCOUNTER — Encounter

## 2023-10-06 ENCOUNTER — Other Ambulatory Visit: Payer: Self-pay | Admitting: Internal Medicine

## 2023-10-06 ENCOUNTER — Ambulatory Visit: Admitting: Internal Medicine

## 2023-10-06 MED ORDER — TRIAMCINOLONE ACETONIDE 0.5 % EX OINT: 1.0000 | TOPICAL_OINTMENT | Freq: Two times a day (BID) | CUTANEOUS | 0 refills | Status: AC

## 2023-10-06 NOTE — Telephone Encounter (Signed)
 Tried to call patient in regards to his blisters and redness but no answer. So I just sent him a MyChart message letting him know that Dr. Alena Bills has been out of office this week but she does come back today, and that I would get back with him as soon as she reviews the concern.

## 2023-10-07 NOTE — Telephone Encounter (Signed)
 Called pt to try and get appt today, pt reports he spoke w his oncologist and they prescribed him Kenalog cream. He would like to try this first and see if it helps, will let us know if he changes his mind and would like to make an appt.

## 2023-10-13 ENCOUNTER — Other Ambulatory Visit: Payer: Self-pay

## 2023-10-13 ENCOUNTER — Encounter: Payer: Self-pay | Admitting: Internal Medicine

## 2023-10-13 ENCOUNTER — Telehealth: Payer: Self-pay

## 2023-10-13 ENCOUNTER — Telehealth: Payer: Self-pay | Admitting: Internal Medicine

## 2023-10-13 ENCOUNTER — Inpatient Hospital Stay

## 2023-10-13 ENCOUNTER — Inpatient Hospital Stay: Attending: Internal Medicine

## 2023-10-13 ENCOUNTER — Inpatient Hospital Stay (HOSPITAL_BASED_OUTPATIENT_CLINIC_OR_DEPARTMENT_OTHER): Admitting: Internal Medicine

## 2023-10-13 VITALS — BP 106/90 | HR 100 | Temp 97.6°F | Resp 18 | Wt 136.0 lb

## 2023-10-13 DIAGNOSIS — D509 Iron deficiency anemia, unspecified: Secondary | ICD-10-CM | POA: Insufficient documentation

## 2023-10-13 DIAGNOSIS — C7951 Secondary malignant neoplasm of bone: Secondary | ICD-10-CM

## 2023-10-13 DIAGNOSIS — I110 Hypertensive heart disease with heart failure: Secondary | ICD-10-CM | POA: Diagnosis not present

## 2023-10-13 DIAGNOSIS — I5042 Chronic combined systolic (congestive) and diastolic (congestive) heart failure: Secondary | ICD-10-CM | POA: Insufficient documentation

## 2023-10-13 DIAGNOSIS — C642 Malignant neoplasm of left kidney, except renal pelvis: Secondary | ICD-10-CM | POA: Insufficient documentation

## 2023-10-13 LAB — CBC WITH DIFFERENTIAL/PLATELET
Abs Immature Granulocytes: 0.01 10*3/uL (ref 0.00–0.07)
Basophils Absolute: 0 10*3/uL (ref 0.0–0.1)
Basophils Relative: 1 %
Eosinophils Absolute: 0.4 10*3/uL (ref 0.0–0.5)
Eosinophils Relative: 8 %
HCT: 42.3 % (ref 39.0–52.0)
Hemoglobin: 13.8 g/dL (ref 13.0–17.0)
Immature Granulocytes: 0 %
Lymphocytes Relative: 24 %
Lymphs Abs: 1.1 10*3/uL (ref 0.7–4.0)
MCH: 28.9 pg (ref 26.0–34.0)
MCHC: 32.6 g/dL (ref 30.0–36.0)
MCV: 88.7 fL (ref 80.0–100.0)
Monocytes Absolute: 0.7 10*3/uL (ref 0.1–1.0)
Monocytes Relative: 16 %
Neutro Abs: 2.4 10*3/uL (ref 1.7–7.7)
Neutrophils Relative %: 51 %
Platelets: 156 10*3/uL (ref 150–400)
RBC: 4.77 MIL/uL (ref 4.22–5.81)
RDW: 13.5 % (ref 11.5–15.5)
WBC: 4.6 10*3/uL (ref 4.0–10.5)
nRBC: 0 % (ref 0.0–0.2)

## 2023-10-13 LAB — COMPREHENSIVE METABOLIC PANEL WITH GFR
ALT: 20 U/L (ref 0–44)
AST: 18 U/L (ref 15–41)
Albumin: 4 g/dL (ref 3.5–5.0)
Alkaline Phosphatase: 81 U/L (ref 38–126)
Anion gap: 8 (ref 5–15)
BUN: 7 mg/dL (ref 6–20)
CO2: 26 mmol/L (ref 22–32)
Calcium: 8 mg/dL — ABNORMAL LOW (ref 8.9–10.3)
Chloride: 103 mmol/L (ref 98–111)
Creatinine, Ser: 0.77 mg/dL (ref 0.61–1.24)
GFR, Estimated: >60 mL/min (ref >60)
Glucose, Bld: 110 mg/dL — ABNORMAL HIGH (ref 70–99)
Potassium: 3.7 mmol/L (ref 3.5–5.1)
Sodium: 137 mmol/L (ref 135–145)
Total Bilirubin: 2.1 mg/dL — ABNORMAL HIGH (ref 0.0–1.2)
Total Protein: 7.1 g/dL (ref 6.5–8.1)

## 2023-10-13 MED ORDER — STERILE WATER FOR INJECTION IJ SOLN
5.0000 mL | Freq: Four times a day (QID) | OROMUCOSAL | 3 refills | Status: DC | PRN
  Filled 2023-10-13: qty 480, 12d supply, fill #0

## 2023-10-13 NOTE — Telephone Encounter (Signed)
 Per LOS on 10/13/2023. Pt will send Mychart message when he decided what provider he would like to transfer too.

## 2023-10-13 NOTE — Telephone Encounter (Signed)
 Faxed over the new prescription of magic mouthwash to our pharmacy

## 2023-10-13 NOTE — Telephone Encounter (Signed)
 I sent a secure chat to the community pharmacy letting them know that I faxed over a hard copy for his Magic Mouthwash, and if it needs a prior authorization to please let me know.
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
## 2023-10-13 NOTE — Progress Notes (Signed)
 Nutrition Follow-up:  Patient with stage IV, left clear cell RCC.  S/p radical nephrectomy and adrenalectomy.  Patient started cabozantinib on 3/13 but has since stopped due to mouth pain and blisters on hand and feet.    Met with patient following MD visit.  Continuing to hold medication at this time.  Reports that mouth pain has improved as well as blisters on hands and feet.  Reports dry mouth.     Medications: reviewed  Labs: reviewed  Anthropometrics:   Weight 136 lb today  140 lb on 3/11 143 lb on 2/17 140 lb on 1/20 138 lb on 12/13  3% weight loss in the last in the last month, concerning   NUTRITION DIAGNOSIS: Inadequate oral intake continues    INTERVENTION:  Encouraged "nibbling" within 1 hour of waking then q 2 hours Discussed soft, moist foods for dry mouth Encouraged adding condiments to food for calories and moisture (butter, gravies, dressing, dips, etc) Patient has contact information    MONITORING, EVALUATION, GOAL: weight trends, intake   NEXT VISIT: to be determined  Denton Derks B. Elease Hashimoto, CSO, LDN Registered Dietitian [phone]

## 2023-10-13 NOTE — Progress Notes (Signed)
 Patient is feeling better, his blisters are gone, he has been having some burning when urinating for about 1 week now. He needs a refill on magic mouthwash. He also needs a prior authorization on his lidocain mouth wash.

## 2023-10-22 ENCOUNTER — Other Ambulatory Visit: Payer: Self-pay | Admitting: Internal Medicine

## 2023-10-23 ENCOUNTER — Encounter: Payer: Self-pay | Admitting: Internal Medicine

## 2023-10-24 ENCOUNTER — Encounter: Payer: Self-pay | Admitting: Internal Medicine

## 2023-10-24 NOTE — Telephone Encounter (Signed)
 Stay on pantoprazole

## 2023-11-02 ENCOUNTER — Other Ambulatory Visit: Payer: Self-pay | Admitting: Student in an Organized Health Care Education/Training Program

## 2023-11-02 DIAGNOSIS — J3089 Other allergic rhinitis: Secondary | ICD-10-CM

## 2023-11-02 DIAGNOSIS — R053 Chronic cough: Secondary | ICD-10-CM

## 2023-11-02 NOTE — Telephone Encounter (Signed)
 Pt saw Dr. Darnelle Elders 12/31/22

## 2023-11-04 ENCOUNTER — Encounter: Payer: Self-pay | Admitting: Internal Medicine

## 2023-11-04 ENCOUNTER — Other Ambulatory Visit: Payer: Self-pay

## 2023-11-04 ENCOUNTER — Telehealth: Payer: Self-pay

## 2023-11-04 ENCOUNTER — Other Ambulatory Visit (HOSPITAL_COMMUNITY): Payer: Self-pay

## 2023-11-04 MED ORDER — STERILE WATER FOR INJECTION IJ SOLN
5.0000 mL | Freq: Four times a day (QID) | OROMUCOSAL | 3 refills | Status: DC | PRN
  Filled 2023-11-04: qty 480, 12d supply, fill #0

## 2023-11-04 NOTE — Telephone Encounter (Signed)
 Pharmacy Patient Advocate Encounter  Received notification from [facility] that Prior Authorization for Pantoprazole  40mg  tabs has been APPROVED from 10/05/23 to 11/03/24   PA #/Case ID/Reference #:  [id number]

## 2023-11-04 NOTE — Telephone Encounter (Signed)
 Called and spoke with patient. Informed him of approval and he expressed understanding.

## 2023-11-04 NOTE — Telephone Encounter (Signed)
 Patient needs a PA for his Protonix  40mg  as his insurance will only cover a 90 day supply for 365 days.

## 2023-11-04 NOTE — Telephone Encounter (Signed)
 Pharmacy Patient Advocate Encounter   Received notification from Pt Calls Messages that prior authorization for Pantoprazole  40mg  is required/requested.   Insurance verification completed.   The patient is insured through [facility] .   Per test claim: PA required; PA submitted to above mentioned insurance via CoverMyMeds Key/confirmation #/EOC [id number] Status is pending

## 2023-11-07 LAB — HM DIABETES EYE EXAM

## 2023-11-14 ENCOUNTER — Telehealth: Payer: Self-pay | Admitting: *Deleted

## 2023-11-14 DIAGNOSIS — C642 Malignant neoplasm of left kidney, except renal pelvis: Secondary | ICD-10-CM

## 2023-11-14 NOTE — Telephone Encounter (Signed)
-----   Message from Loreatha Rodney sent at 11/14/2023 10:36 AM EDT ----- Regarding: follow up Hello,   I think Mr. Pearson did not reach out to schedule follow up. He was supposed to send us  mychart message with the provider name.  I do not want him to get lost. He was going to think about Dr. B.   Please schedule with Dr. B, port flush/lab next week.  Thank you  Kavita

## 2023-11-14 NOTE — Telephone Encounter (Signed)
 Spoke with patient. Explained that Dr. Geraldene Kleine doesn't have any openings next week. He stated that he is ok with seeing Lauren, NP and then establish the next visit with Dr. Geraldene Kleine.

## 2023-11-14 NOTE — Telephone Encounter (Signed)
 Rn spoke with patient. Pt confirmed that he would like to be set up with an apt with Dr. Valentine Gasmen. Msg sent to scheduling to set up a port flush lab and see Dr. B next week.

## 2023-11-18 ENCOUNTER — Other Ambulatory Visit: Payer: Self-pay | Admitting: *Deleted

## 2023-11-18 ENCOUNTER — Other Ambulatory Visit: Payer: Self-pay

## 2023-11-18 MED ORDER — STERILE WATER FOR INJECTION IJ SOLN: 5.0000 mL | Freq: Four times a day (QID) | OROMUCOSAL | 3 refills | Status: DC | PRN

## 2023-11-18 MED ORDER — STERILE WATER FOR INJECTION IJ SOLN
5.0000 mL | Freq: Four times a day (QID) | OROMUCOSAL | 3 refills | Status: DC | PRN
  Filled 2023-11-18: qty 480, 12d supply, fill #0
  Filled 2023-12-04: qty 480, 12d supply, fill #1
  Filled 2023-12-21: qty 480, 12d supply, fill #2
  Filled 2024-02-01: qty 480, 12d supply, fill #3

## 2023-11-21 ENCOUNTER — Inpatient Hospital Stay: Admitting: Nurse Practitioner

## 2023-11-21 ENCOUNTER — Inpatient Hospital Stay: Attending: Nurse Practitioner

## 2023-11-21 ENCOUNTER — Encounter: Payer: Self-pay | Admitting: Nurse Practitioner

## 2023-11-21 VITALS — BP 126/81 | HR 71 | Temp 96.8°F | Resp 20 | Wt 140.6 lb

## 2023-11-21 DIAGNOSIS — K529 Noninfective gastroenteritis and colitis, unspecified: Secondary | ICD-10-CM | POA: Diagnosis not present

## 2023-11-21 DIAGNOSIS — Z7901 Long term (current) use of anticoagulants: Secondary | ICD-10-CM | POA: Insufficient documentation

## 2023-11-21 DIAGNOSIS — C642 Malignant neoplasm of left kidney, except renal pelvis: Secondary | ICD-10-CM | POA: Insufficient documentation

## 2023-11-21 DIAGNOSIS — C7951 Secondary malignant neoplasm of bone: Secondary | ICD-10-CM | POA: Diagnosis not present

## 2023-11-21 DIAGNOSIS — Z905 Acquired absence of kidney: Secondary | ICD-10-CM | POA: Insufficient documentation

## 2023-11-21 DIAGNOSIS — Z86711 Personal history of pulmonary embolism: Secondary | ICD-10-CM | POA: Insufficient documentation

## 2023-11-21 DIAGNOSIS — Z923 Personal history of irradiation: Secondary | ICD-10-CM | POA: Insufficient documentation

## 2023-11-21 DIAGNOSIS — D509 Iron deficiency anemia, unspecified: Secondary | ICD-10-CM | POA: Insufficient documentation

## 2023-11-21 DIAGNOSIS — R911 Solitary pulmonary nodule: Secondary | ICD-10-CM | POA: Diagnosis not present

## 2023-11-21 DIAGNOSIS — Z79899 Other long term (current) drug therapy: Secondary | ICD-10-CM | POA: Diagnosis not present

## 2023-11-21 DIAGNOSIS — K117 Disturbances of salivary secretion: Secondary | ICD-10-CM

## 2023-11-21 LAB — CMP (CANCER CENTER ONLY)
ALT: 16 U/L (ref 0–44)
AST: 17 U/L (ref 15–41)
Albumin: 3.9 g/dL (ref 3.5–5.0)
Alkaline Phosphatase: 56 U/L (ref 38–126)
Anion gap: 10 (ref 5–15)
BUN: 11 mg/dL (ref 6–20)
CO2: 25 mmol/L (ref 22–32)
Calcium: 8.6 mg/dL — ABNORMAL LOW (ref 8.9–10.3)
Chloride: 102 mmol/L (ref 98–111)
Creatinine: 0.73 mg/dL (ref 0.61–1.24)
GFR, Estimated: >60 mL/min (ref >60)
Glucose, Bld: 101 mg/dL — ABNORMAL HIGH (ref 70–99)
Potassium: 3.2 mmol/L — ABNORMAL LOW (ref 3.5–5.1)
Sodium: 137 mmol/L (ref 135–145)
Total Bilirubin: 1.5 mg/dL — ABNORMAL HIGH (ref 0.0–1.2)
Total Protein: 6.8 g/dL (ref 6.5–8.1)

## 2023-11-21 LAB — CBC WITH DIFFERENTIAL (CANCER CENTER ONLY)
Abs Immature Granulocytes: 0.04 10*3/uL (ref 0.00–0.07)
Basophils Absolute: 0 10*3/uL (ref 0.0–0.1)
Basophils Relative: 1 %
Eosinophils Absolute: 0.4 10*3/uL (ref 0.0–0.5)
Eosinophils Relative: 7 %
HCT: 38.7 % — ABNORMAL LOW (ref 39.0–52.0)
Hemoglobin: 13 g/dL (ref 13.0–17.0)
Immature Granulocytes: 1 %
Lymphocytes Relative: 28 %
Lymphs Abs: 1.8 10*3/uL (ref 0.7–4.0)
MCH: 29.2 pg (ref 26.0–34.0)
MCHC: 33.6 g/dL (ref 30.0–36.0)
MCV: 87 fL (ref 80.0–100.0)
Monocytes Absolute: 0.7 10*3/uL (ref 0.1–1.0)
Monocytes Relative: 10 %
Neutro Abs: 3.6 10*3/uL (ref 1.7–7.7)
Neutrophils Relative %: 53 %
Platelet Count: 219 10*3/uL (ref 150–400)
RBC: 4.45 MIL/uL (ref 4.22–5.81)
RDW: 14.2 % (ref 11.5–15.5)
WBC Count: 6.5 10*3/uL (ref 4.0–10.5)
nRBC: 0 % (ref 0.0–0.2)

## 2023-11-21 MED ORDER — PILOCARPINE HCL 5 MG PO TABS
5.0000 mg | ORAL_TABLET | Freq: Three times a day (TID) | ORAL | 0 refills | Status: DC
Start: 2023-11-21 — End: 2023-12-14

## 2023-11-21 MED ORDER — HEPARIN SOD (PORK) LOCK FLUSH 100 UNIT/ML IV SOLN
500.0000 [IU] | Freq: Once | INTRAVENOUS | Status: AC
  Administered 2023-11-21: 500 [IU] via INTRAVENOUS
  Filled 2023-11-21: qty 5
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
## 2023-11-22 ENCOUNTER — Other Ambulatory Visit: Payer: Self-pay | Admitting: *Deleted

## 2023-11-22 DIAGNOSIS — E538 Deficiency of other specified B group vitamins: Secondary | ICD-10-CM

## 2023-11-22 DIAGNOSIS — C7951 Secondary malignant neoplasm of bone: Secondary | ICD-10-CM

## 2023-11-22 DIAGNOSIS — C642 Malignant neoplasm of left kidney, except renal pelvis: Secondary | ICD-10-CM

## 2023-11-23 ENCOUNTER — Other Ambulatory Visit: Payer: Self-pay

## 2023-11-25 ENCOUNTER — Ambulatory Visit (HOSPITAL_COMMUNITY)

## 2023-11-27 ENCOUNTER — Other Ambulatory Visit: Payer: Self-pay

## 2023-12-05 ENCOUNTER — Other Ambulatory Visit: Payer: Self-pay

## 2023-12-05 ENCOUNTER — Ambulatory Visit
Admission: RE | Admit: 2023-12-05 | Discharge: 2023-12-05 | Disposition: A | Discharge status: Home / Self Care | Source: Ambulatory Visit | Attending: Nurse Practitioner | Admitting: Nurse Practitioner

## 2023-12-05 DIAGNOSIS — C642 Malignant neoplasm of left kidney, except renal pelvis: Secondary | ICD-10-CM | POA: Insufficient documentation

## 2023-12-05 DIAGNOSIS — C649 Malignant neoplasm of unspecified kidney, except renal pelvis: Secondary | ICD-10-CM | POA: Diagnosis not present

## 2023-12-05 DIAGNOSIS — K5730 Diverticulosis of large intestine without perforation or abscess without bleeding: Secondary | ICD-10-CM | POA: Diagnosis not present

## 2023-12-05 MED ORDER — IOHEXOL 300 MG/ML  SOLN
100.0000 mL | Freq: Once | INTRAMUSCULAR | Status: AC | PRN
  Administered 2023-12-05: 100 mL via INTRAVENOUS

## 2023-12-06 ENCOUNTER — Telehealth: Payer: Self-pay

## 2023-12-06 NOTE — Telephone Encounter (Signed)
 Intermittent FMLA received has been completed with Kenney Peacemaker, NP signature.   Patient request we mail the completed paperwork to:  [facility] FMLA 01 [id number] [place], [place] [address]

## 2023-12-07 ENCOUNTER — Ambulatory Visit: Payer: Federal, State, Local not specified - PPO | Admitting: Cardiovascular Disease

## 2023-12-12 ENCOUNTER — Encounter: Payer: Self-pay | Admitting: Internal Medicine

## 2023-12-14 ENCOUNTER — Other Ambulatory Visit: Payer: Self-pay | Admitting: Nurse Practitioner

## 2023-12-21 ENCOUNTER — Encounter: Payer: Self-pay | Admitting: Internal Medicine

## 2023-12-21 ENCOUNTER — Inpatient Hospital Stay: Admitting: Internal Medicine

## 2023-12-21 ENCOUNTER — Inpatient Hospital Stay: Attending: Nurse Practitioner

## 2023-12-21 ENCOUNTER — Other Ambulatory Visit: Payer: Self-pay

## 2023-12-21 VITALS — BP 124/64 | HR 86 | Temp 97.8°F | Resp 18 | Ht 70.0 in | Wt 144.0 lb

## 2023-12-21 DIAGNOSIS — C7951 Secondary malignant neoplasm of bone: Secondary | ICD-10-CM | POA: Diagnosis not present

## 2023-12-21 DIAGNOSIS — C642 Malignant neoplasm of left kidney, except renal pelvis: Secondary | ICD-10-CM | POA: Diagnosis not present

## 2023-12-21 DIAGNOSIS — E538 Deficiency of other specified B group vitamins: Secondary | ICD-10-CM | POA: Insufficient documentation

## 2023-12-21 LAB — CMP (CANCER CENTER ONLY)
ALT: 19 U/L (ref 0–44)
AST: 18 U/L (ref 15–41)
Albumin: 4.1 g/dL (ref 3.5–5.0)
Alkaline Phosphatase: 51 U/L (ref 38–126)
Anion gap: 9 (ref 5–15)
BUN: 10 mg/dL (ref 6–20)
CO2: 23 mmol/L (ref 22–32)
Calcium: 9.1 mg/dL (ref 8.9–10.3)
Chloride: 107 mmol/L (ref 98–111)
Creatinine: 0.75 mg/dL (ref 0.61–1.24)
GFR, Estimated: >60 mL/min (ref >60)
Glucose, Bld: 93 mg/dL (ref 70–99)
Potassium: 3.4 mmol/L — ABNORMAL LOW (ref 3.5–5.1)
Sodium: 139 mmol/L (ref 135–145)
Total Bilirubin: 1.2 mg/dL (ref 0.0–1.2)
Total Protein: 7.2 g/dL (ref 6.5–8.1)

## 2023-12-21 LAB — CBC WITH DIFFERENTIAL (CANCER CENTER ONLY)
Abs Immature Granulocytes: 0.04 10*3/uL (ref 0.00–0.07)
Basophils Absolute: 0.1 10*3/uL (ref 0.0–0.1)
Basophils Relative: 1 %
Eosinophils Absolute: 0.3 10*3/uL (ref 0.0–0.5)
Eosinophils Relative: 5 %
HCT: 38.5 % — ABNORMAL LOW (ref 39.0–52.0)
Hemoglobin: 13.2 g/dL (ref 13.0–17.0)
Immature Granulocytes: 1 %
Lymphocytes Relative: 25 %
Lymphs Abs: 1.7 10*3/uL (ref 0.7–4.0)
MCH: 30.3 pg (ref 26.0–34.0)
MCHC: 34.3 g/dL (ref 30.0–36.0)
MCV: 88.5 fL (ref 80.0–100.0)
Monocytes Absolute: 0.5 10*3/uL (ref 0.1–1.0)
Monocytes Relative: 8 %
Neutro Abs: 4 10*3/uL (ref 1.7–7.7)
Neutrophils Relative %: 60 %
Platelet Count: 233 10*3/uL (ref 150–400)
RBC: 4.35 MIL/uL (ref 4.22–5.81)
RDW: 13.2 % (ref 11.5–15.5)
WBC Count: 6.6 10*3/uL (ref 4.0–10.5)
nRBC: 0 % (ref 0.0–0.2)

## 2023-12-21 LAB — VITAMIN B12: Vitamin B-12: 196 pg/mL (ref 180–914)

## 2023-12-21 MED ORDER — APIXABAN 2.5 MG PO TABS: 5.0000 mg | ORAL_TABLET | Freq: Two times a day (BID) | ORAL | 3 refills | Status: AC

## 2023-12-21 MED ORDER — CEVIMELINE HCL 30 MG PO CAPS: 30.0000 mg | ORAL_CAPSULE | Freq: Three times a day (TID) | ORAL | 6 refills | Status: DC

## 2023-12-21 NOTE — Progress Notes (Signed)
 Patient had a CT scan on 12/05/2023. Patient was wanting to know if he still has to take the eliquis . He is taking something for dry mouth, but it isn't working, so he wants to know if there is something else that he could try to help with his dry mouth?

## 2023-12-21 NOTE — Assessment & Plan Note (Addendum)
 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  # Most recently on cabozantinib 40 mg once daily on 09/15/2023-grade 3, palmo-plantar erythrodysesthesia (treated with topical steroids), mouth sores and glossitis.  Caused poor p.o. intake and weight loss.  Discontinued on 09/29/2023.  # JUNE 2nd, 2025- CT CAP- Status post left nephrectomy with similar nonspecific filling defect in the IVC at the level of the left renal vein confluence.  No convincing evidence for metastatic disease within the chest, abdomen, or pelvis.  Given the overall stability of the disease and poor tolerance to therapy recommend continued surveillance.  Will repeat CT CAP in 3 months   #  Dry mouth- etiology unclear - developed post cabozantinib-  trial pilocarbine 5 mg TID- not improved- recommend cevelimine TID.    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  Completed SBRT x 5 sessions in October 2024.-CT right hip imaging q6 months. Last in 07/2023 showed stable lytic lesion. Awaiting CT femur with [facility] in July 2025.    # Bilateral PE- Detected on 12/19/2022.for now continue Eliquis  5 mg BID- recommend 2.5 mg BID. -Echo showed global hypokinesis with EF of 40 to 45%.  Follows with cardiology Dr. Francyne   # Left lower lobe pulmonary nodule:  s/p EBUS with Dr. Philipp on 12/09/22. Lot of atelectasis present.  Transbronchial biopsy was negative for malignancy.   # Iron  deficiency anemia: IN tolerate iron  pills.  Completed IV Venofer  200 mg x 5 doses- 13. Monitor.    # Borderline low vitamin B12: Vitamin B12 239.  Recommended B12 supplements 1000 mcg daily over-the-counter.   # Access- port- port flush every 6-12 weeks   # DISPOSITION: # in 6 weeks-  port flush # follow up in 3 months MD;  port/lab (cbc, cmp, b12), thyroid  profile- D[facility]- CT CAP- Prior-Dr.B  # I reviewed the blood work- with the patient in detail; also reviewed the imaging independently [as summarized above]; and with the patient in detail.
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
## 2023-12-22 ENCOUNTER — Other Ambulatory Visit: Payer: Self-pay

## 2023-12-26 ENCOUNTER — Encounter: Payer: Self-pay | Admitting: Internal Medicine

## 2023-12-28 ENCOUNTER — Other Ambulatory Visit: Payer: Self-pay | Admitting: *Deleted

## 2023-12-28 ENCOUNTER — Other Ambulatory Visit: Payer: Self-pay

## 2023-12-28 DIAGNOSIS — C7951 Secondary malignant neoplasm of bone: Secondary | ICD-10-CM

## 2023-12-28 DIAGNOSIS — C642 Malignant neoplasm of left kidney, except renal pelvis: Secondary | ICD-10-CM

## 2023-12-29 ENCOUNTER — Encounter: Payer: Self-pay | Admitting: Hospice and Palliative Medicine

## 2023-12-29 ENCOUNTER — Inpatient Hospital Stay

## 2023-12-29 ENCOUNTER — Inpatient Hospital Stay (HOSPITAL_BASED_OUTPATIENT_CLINIC_OR_DEPARTMENT_OTHER): Admitting: Hospice and Palliative Medicine

## 2023-12-29 VITALS — BP 126/84 | Temp 97.4°F | Resp 16 | Wt 143.0 lb

## 2023-12-29 DIAGNOSIS — C642 Malignant neoplasm of left kidney, except renal pelvis: Secondary | ICD-10-CM

## 2023-12-29 DIAGNOSIS — Z515 Encounter for palliative care: Secondary | ICD-10-CM

## 2023-12-29 DIAGNOSIS — F419 Anxiety disorder, unspecified: Secondary | ICD-10-CM | POA: Diagnosis not present

## 2023-12-29 DIAGNOSIS — C7951 Secondary malignant neoplasm of bone: Secondary | ICD-10-CM | POA: Diagnosis not present

## 2023-12-29 MED ORDER — CITALOPRAM HYDROBROMIDE 20 MG PO TABS: 20.0000 mg | ORAL_TABLET | Freq: Every day | ORAL | 2 refills | Status: DC

## 2023-12-29 NOTE — Progress Notes (Signed)
 [facility] CSW Counseling Note  Patient was referred by medical provider, Paul Mower, NP. Treatment type: Individual  Presenting Concerns: Patient and/or family reports the following symptoms/concerns: anxiety Duration of problem: Unknown.  Severity of problem: moderate   Orientation:oriented to person, place, time/date, situation, day of week, month of year, and year.   Affect: NA due to phone session. Risk of harm to self or others: No plan to harm self or others  Patient and/or Family's Strengths/Protective Factors: Concrete supports in place (healthy food, safe environments, etc.)Average or above average intelligence  Capable of independent living  Communication skills  Financial means  General fund of knowledge  Supportive family/friends  Work skills      Goals Addressed: Patient will:  Reduce symptoms of: anxiety Increase knowledge and/or ability of: self-management skills  Increase healthy adjustment to current life circumstances   Progress towards Goals: Initial   Interventions: Interventions utilized:  Motivational Interviewing and Supportive Counseling   Assessment: Patient currently experiencing anxiety.  Session was shortened per patient's request.  He confirmed meeting with the NP today and expressing increased anxiety related to his job.  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  Encouraged patient to speak to his HR department at work to discuss options.  He currently feels overwhelmed.      Plan: Follow up with CSW: CSW to call patient Monday, 6/30. Behavioral recommendations: Meditation. Referral(s): None at this time.       Paul HERO [name], LCSW
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
## 2024-01-02 ENCOUNTER — Other Ambulatory Visit: Payer: Self-pay | Admitting: Internal Medicine

## 2024-01-02 ENCOUNTER — Telehealth: Payer: Self-pay | Admitting: *Deleted

## 2024-01-02 NOTE — Telephone Encounter (Signed)
 Msg sent to patient re: massage therapy.

## 2024-01-09 DIAGNOSIS — C7951 Secondary malignant neoplasm of bone: Secondary | ICD-10-CM | POA: Diagnosis not present

## 2024-01-09 DIAGNOSIS — C649 Malignant neoplasm of unspecified kidney, except renal pelvis: Secondary | ICD-10-CM | POA: Diagnosis not present

## 2024-01-16 ENCOUNTER — Encounter: Payer: Self-pay | Admitting: Internal Medicine

## 2024-01-21 ENCOUNTER — Other Ambulatory Visit: Payer: Self-pay | Admitting: Hospice and Palliative Medicine

## 2024-01-23 ENCOUNTER — Encounter: Payer: Self-pay | Admitting: Internal Medicine

## 2024-01-27 ENCOUNTER — Inpatient Hospital Stay: Attending: Nurse Practitioner | Admitting: Hospice and Palliative Medicine

## 2024-01-27 DIAGNOSIS — F419 Anxiety disorder, unspecified: Secondary | ICD-10-CM | POA: Diagnosis not present

## 2024-01-27 DIAGNOSIS — Z515 Encounter for palliative care: Secondary | ICD-10-CM

## 2024-01-27 DIAGNOSIS — Z905 Acquired absence of kidney: Secondary | ICD-10-CM | POA: Insufficient documentation

## 2024-01-27 DIAGNOSIS — C642 Malignant neoplasm of left kidney, except renal pelvis: Secondary | ICD-10-CM | POA: Insufficient documentation

## 2024-01-27 DIAGNOSIS — C7951 Secondary malignant neoplasm of bone: Secondary | ICD-10-CM | POA: Insufficient documentation

## 2024-01-27 DIAGNOSIS — Z79899 Other long term (current) drug therapy: Secondary | ICD-10-CM | POA: Insufficient documentation

## 2024-01-27 DIAGNOSIS — R21 Rash and other nonspecific skin eruption: Secondary | ICD-10-CM | POA: Insufficient documentation

## 2024-01-27 MED ORDER — CITALOPRAM HYDROBROMIDE 10 MG PO TABS: 10.0000 mg | ORAL_TABLET | Freq: Every day | ORAL | 3 refills | Status: DC

## 2024-01-27 NOTE — Progress Notes (Signed)
 Virtual Visit via Telephone Note  I connected with Paul Flowers on 01/27/24 at  1:20 PM EDT by telephone and verified that I am speaking with the correct person using two identifiers.  Location: Patient: Car Provider: Clinic   I discussed the limitations, risks, security and privacy concerns of performing an evaluation and management service by telephone and the availability of in person appointments. I also discussed with the patient that there may be a patient responsible charge related to this service. The patient expressed understanding and agreed to proceed.   History of Present Illness: Paul Flowers is a 53 y.o. 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  Palliative care was consulted to address goals and manage ongoing symptoms.   Observations/Objective: I called and spoke with patient by phone.  Patient feels like his mental health has improved since we last spoke.  He denies less depression or anxiety and feels that the citalopram  is helping.  However, patient says that he is not taking it daily as it sometimes causes nausea.  Discussed that consistent dosing is necessary to maximize benefit.  Suggested that we reduce the dose to 10 mg to see if that is better tolerated.  Patient has not yet established care with counseling.  Assessment and Plan: Anxiety/depression -reduce dose of citalopram  to 10 mg daily due to nausea.  Recommend follow-up with counseling  Follow Up Instructions: Follow-up telephone visit 1 to 2 months   I discussed the assessment and treatment plan with the patient. The patient was provided an opportunity to ask questions and all were answered. The patient agreed with the plan and demonstrated an understanding of the instructions.   The patient was advised to call back or seek an in-person evaluation if the symptoms worsen or if the condition fails to improve as  anticipated.  I provided 10 minutes of non-face-to-face time during this encounter.   FONDA JONELLE MOWER, NP

## 2024-01-31 ENCOUNTER — Encounter: Payer: Self-pay | Admitting: Internal Medicine

## 2024-02-01 ENCOUNTER — Inpatient Hospital Stay

## 2024-02-01 ENCOUNTER — Encounter

## 2024-02-01 ENCOUNTER — Encounter: Payer: Self-pay | Admitting: Internal Medicine

## 2024-02-01 ENCOUNTER — Other Ambulatory Visit: Payer: Self-pay

## 2024-02-01 ENCOUNTER — Inpatient Hospital Stay (HOSPITAL_BASED_OUTPATIENT_CLINIC_OR_DEPARTMENT_OTHER): Admitting: Hospice and Palliative Medicine

## 2024-02-01 ENCOUNTER — Encounter: Payer: Self-pay | Admitting: Hospice and Palliative Medicine

## 2024-02-01 VITALS — BP 132/89 | HR 77 | Temp 98.9°F | Resp 16 | Ht 70.0 in | Wt 142.2 lb

## 2024-02-01 DIAGNOSIS — R21 Rash and other nonspecific skin eruption: Secondary | ICD-10-CM | POA: Diagnosis not present

## 2024-02-01 DIAGNOSIS — F419 Anxiety disorder, unspecified: Secondary | ICD-10-CM | POA: Diagnosis not present

## 2024-02-01 DIAGNOSIS — Z905 Acquired absence of kidney: Secondary | ICD-10-CM | POA: Diagnosis not present

## 2024-02-01 DIAGNOSIS — Z79899 Other long term (current) drug therapy: Secondary | ICD-10-CM | POA: Diagnosis not present

## 2024-02-01 DIAGNOSIS — C642 Malignant neoplasm of left kidney, except renal pelvis: Secondary | ICD-10-CM | POA: Diagnosis not present

## 2024-02-01 DIAGNOSIS — C7951 Secondary malignant neoplasm of bone: Secondary | ICD-10-CM | POA: Diagnosis not present

## 2024-02-01 MED ORDER — SODIUM CHLORIDE 0.9% FLUSH
10.0000 mL | Freq: Once | INTRAVENOUS | Status: AC
  Administered 2024-02-01: 10 mL via INTRAVENOUS
  Filled 2024-02-01: qty 10

## 2024-02-01 MED ORDER — LORATADINE 10 MG PO TABS: 10.0000 mg | ORAL_TABLET | Freq: Every day | ORAL | 0 refills | Status: AC | PRN

## 2024-02-01 MED ORDER — HEPARIN SOD (PORK) LOCK FLUSH 100 UNIT/ML IV SOLN
500.0000 [IU] | Freq: Once | INTRAVENOUS | Status: AC
  Administered 2024-02-01: 500 [IU] via INTRAVENOUS
  Filled 2024-02-01: qty 5

## 2024-02-01 NOTE — Progress Notes (Signed)
Patient tolerated port flush well today, port flushes well. Blood return noted. No concerns voiced, patient discharged, stable.  

## 2024-02-01 NOTE — Progress Notes (Signed)
 Requests magic mouthwash refill. Also ran out of Ferric Maltol .  Denies itching today; has benadryl  on hand but has not taken. Stopped using plant based soap Commando on Monday; has been using Cetaphil body wash as of yesterday.  Had CT done in [place].  Mornings wakes up nauseous but it eventually goes away; manages with ondansetron .
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
## 2024-02-03 ENCOUNTER — Inpatient Hospital Stay: Attending: Nurse Practitioner

## 2024-02-03 NOTE — Progress Notes (Signed)
 Nutrition Follow-up:  Patient with stage IV , left clear cell RCC.  S/p radical nephrectomy and adrenalectomy.  Currently under surveillance.    Spoke with patient briefly via phone (at work).  Reports that appetite is pretty good.  Noted weight gain.  Sometimes has nausea and utilizes medication to help.      Medications: reviewed  Labs: reviewed  Anthropometrics:   Weight 142 lb 3.2 oz on 7/30  136 lb on 4/10 140 lb on 3/11 143 lb on 2/17 140 lb on 07/25/23   NUTRITION DIAGNOSIS: Inadequate oral intake improved with weight gain    INTERVENTION:  Continue high calorie, high protein foods for weight maintenance/gain RD available as needed     NEXT VISIT: as needed  Alpa Salvo B. Dasie SOLON, CSO, LDN Registered Dietitian [phone]

## 2024-02-18 ENCOUNTER — Other Ambulatory Visit: Payer: Self-pay | Admitting: Hospice and Palliative Medicine

## 2024-02-21 ENCOUNTER — Encounter: Payer: Self-pay | Admitting: Internal Medicine

## 2024-02-23 DIAGNOSIS — H15103 Unspecified episcleritis, bilateral: Secondary | ICD-10-CM | POA: Diagnosis not present

## 2024-02-23 DIAGNOSIS — E119 Type 2 diabetes mellitus without complications: Secondary | ICD-10-CM | POA: Diagnosis not present

## 2024-02-23 NOTE — Progress Notes (Signed)
 After multiple attempts made, we have not been able to reach the patient to enroll in services.  The referral has been closed.  [facility] remains available should the patient express interest in the future.

## 2024-03-15 ENCOUNTER — Inpatient Hospital Stay: Attending: Nurse Practitioner | Admitting: Hospice and Palliative Medicine

## 2024-03-15 ENCOUNTER — Encounter: Payer: Self-pay | Admitting: Hospice and Palliative Medicine

## 2024-03-15 DIAGNOSIS — D509 Iron deficiency anemia, unspecified: Secondary | ICD-10-CM | POA: Insufficient documentation

## 2024-03-15 DIAGNOSIS — R5381 Other malaise: Secondary | ICD-10-CM | POA: Insufficient documentation

## 2024-03-15 DIAGNOSIS — Z79899 Other long term (current) drug therapy: Secondary | ICD-10-CM | POA: Insufficient documentation

## 2024-03-15 DIAGNOSIS — R911 Solitary pulmonary nodule: Secondary | ICD-10-CM | POA: Insufficient documentation

## 2024-03-15 DIAGNOSIS — Z9221 Personal history of antineoplastic chemotherapy: Secondary | ICD-10-CM | POA: Insufficient documentation

## 2024-03-15 DIAGNOSIS — Z86711 Personal history of pulmonary embolism: Secondary | ICD-10-CM | POA: Insufficient documentation

## 2024-03-15 DIAGNOSIS — E538 Deficiency of other specified B group vitamins: Secondary | ICD-10-CM | POA: Insufficient documentation

## 2024-03-15 DIAGNOSIS — R682 Dry mouth, unspecified: Secondary | ICD-10-CM | POA: Insufficient documentation

## 2024-03-15 DIAGNOSIS — C642 Malignant neoplasm of left kidney, except renal pelvis: Secondary | ICD-10-CM | POA: Insufficient documentation

## 2024-03-15 DIAGNOSIS — Z515 Encounter for palliative care: Secondary | ICD-10-CM

## 2024-03-15 DIAGNOSIS — C7951 Secondary malignant neoplasm of bone: Secondary | ICD-10-CM | POA: Insufficient documentation

## 2024-03-15 DIAGNOSIS — K529 Noninfective gastroenteritis and colitis, unspecified: Secondary | ICD-10-CM | POA: Insufficient documentation

## 2024-03-15 DIAGNOSIS — R5383 Other fatigue: Secondary | ICD-10-CM | POA: Insufficient documentation

## 2024-03-15 DIAGNOSIS — R110 Nausea: Secondary | ICD-10-CM | POA: Insufficient documentation

## 2024-03-15 DIAGNOSIS — Z905 Acquired absence of kidney: Secondary | ICD-10-CM | POA: Insufficient documentation

## 2024-03-15 NOTE — Progress Notes (Signed)
 Voicemail left.  Will reschedule

## 2024-03-17 ENCOUNTER — Other Ambulatory Visit: Payer: Self-pay | Admitting: Internal Medicine

## 2024-03-22 ENCOUNTER — Other Ambulatory Visit

## 2024-03-22 ENCOUNTER — Ambulatory Visit: Admitting: Internal Medicine

## 2024-03-23 ENCOUNTER — Inpatient Hospital Stay: Admission: RE | Admit: 2024-03-23 | Source: Ambulatory Visit

## 2024-03-26 ENCOUNTER — Ambulatory Visit
Admission: RE | Admit: 2024-03-26 | Discharge: 2024-03-26 | Disposition: A | Discharge status: Home / Self Care | Source: Ambulatory Visit | Attending: Internal Medicine | Admitting: Internal Medicine

## 2024-03-26 ENCOUNTER — Encounter: Payer: Self-pay | Admitting: Internal Medicine

## 2024-03-26 DIAGNOSIS — C642 Malignant neoplasm of left kidney, except renal pelvis: Secondary | ICD-10-CM

## 2024-03-26 DIAGNOSIS — R911 Solitary pulmonary nodule: Secondary | ICD-10-CM | POA: Diagnosis not present

## 2024-03-26 DIAGNOSIS — N200 Calculus of kidney: Secondary | ICD-10-CM | POA: Diagnosis not present

## 2024-03-26 MED ORDER — IOPAMIDOL (ISOVUE-300) INJECTION 61%
100.0000 mL | Freq: Once | INTRAVENOUS | Status: AC | PRN
Start: 2024-03-26 — End: 2024-03-26
  Administered 2024-03-26: 100 mL via INTRAVENOUS

## 2024-03-27 ENCOUNTER — Other Ambulatory Visit: Payer: Self-pay

## 2024-04-03 ENCOUNTER — Encounter: Payer: Self-pay | Admitting: Internal Medicine

## 2024-04-03 ENCOUNTER — Inpatient Hospital Stay

## 2024-04-03 ENCOUNTER — Inpatient Hospital Stay: Admitting: Internal Medicine

## 2024-04-03 ENCOUNTER — Other Ambulatory Visit: Payer: Self-pay | Admitting: Internal Medicine

## 2024-04-03 ENCOUNTER — Other Ambulatory Visit: Payer: Self-pay

## 2024-04-03 VITALS — BP 130/96 | HR 92 | Temp 98.0°F | Resp 16 | Ht 70.0 in | Wt 146.3 lb

## 2024-04-03 DIAGNOSIS — Z905 Acquired absence of kidney: Secondary | ICD-10-CM | POA: Diagnosis not present

## 2024-04-03 DIAGNOSIS — Z9221 Personal history of antineoplastic chemotherapy: Secondary | ICD-10-CM | POA: Diagnosis not present

## 2024-04-03 DIAGNOSIS — R682 Dry mouth, unspecified: Secondary | ICD-10-CM | POA: Diagnosis not present

## 2024-04-03 DIAGNOSIS — K529 Noninfective gastroenteritis and colitis, unspecified: Secondary | ICD-10-CM | POA: Diagnosis not present

## 2024-04-03 DIAGNOSIS — R911 Solitary pulmonary nodule: Secondary | ICD-10-CM | POA: Diagnosis not present

## 2024-04-03 DIAGNOSIS — C642 Malignant neoplasm of left kidney, except renal pelvis: Secondary | ICD-10-CM

## 2024-04-03 DIAGNOSIS — C7951 Secondary malignant neoplasm of bone: Secondary | ICD-10-CM | POA: Diagnosis not present

## 2024-04-03 DIAGNOSIS — E538 Deficiency of other specified B group vitamins: Secondary | ICD-10-CM | POA: Insufficient documentation

## 2024-04-03 DIAGNOSIS — R110 Nausea: Secondary | ICD-10-CM

## 2024-04-03 DIAGNOSIS — Z79899 Other long term (current) drug therapy: Secondary | ICD-10-CM | POA: Diagnosis not present

## 2024-04-03 DIAGNOSIS — D509 Iron deficiency anemia, unspecified: Secondary | ICD-10-CM | POA: Diagnosis not present

## 2024-04-03 DIAGNOSIS — Z86711 Personal history of pulmonary embolism: Secondary | ICD-10-CM | POA: Diagnosis not present

## 2024-04-03 LAB — CBC WITH DIFFERENTIAL (CANCER CENTER ONLY)
Abs Immature Granulocytes: 0.02 K/uL (ref 0.00–0.07)
Basophils Absolute: 0 K/uL (ref 0.0–0.1)
Basophils Relative: 1 %
Eosinophils Absolute: 0.3 K/uL (ref 0.0–0.5)
Eosinophils Relative: 6 %
HCT: 38.9 % — ABNORMAL LOW (ref 39.0–52.0)
Hemoglobin: 13.5 g/dL (ref 13.0–17.0)
Immature Granulocytes: 0 %
Lymphocytes Relative: 27 %
Lymphs Abs: 1.3 K/uL (ref 0.7–4.0)
MCH: 30.3 pg (ref 26.0–34.0)
MCHC: 34.7 g/dL (ref 30.0–36.0)
MCV: 87.4 fL (ref 80.0–100.0)
Monocytes Absolute: 0.5 K/uL (ref 0.1–1.0)
Monocytes Relative: 11 %
Neutro Abs: 2.6 K/uL (ref 1.7–7.7)
Neutrophils Relative %: 55 %
Platelet Count: 212 K/uL (ref 150–400)
RBC: 4.45 MIL/uL (ref 4.22–5.81)
RDW: 12 % (ref 11.5–15.5)
WBC Count: 4.7 K/uL (ref 4.0–10.5)
nRBC: 0 % (ref 0.0–0.2)

## 2024-04-03 LAB — CMP (CANCER CENTER ONLY)
ALT: 14 U/L (ref 0–44)
AST: 20 U/L (ref 15–41)
Albumin: 4 g/dL (ref 3.5–5.0)
Alkaline Phosphatase: 54 U/L (ref 38–126)
Anion gap: 7 (ref 5–15)
BUN: 10 mg/dL (ref 6–20)
CO2: 23 mmol/L (ref 22–32)
Calcium: 8.7 mg/dL — ABNORMAL LOW (ref 8.9–10.3)
Chloride: 106 mmol/L (ref 98–111)
Creatinine: 1.03 mg/dL (ref 0.61–1.24)
GFR, Estimated: >60 mL/min (ref >60)
Glucose, Bld: 172 mg/dL — ABNORMAL HIGH (ref 70–99)
Potassium: 3.3 mmol/L — ABNORMAL LOW (ref 3.5–5.1)
Sodium: 136 mmol/L (ref 135–145)
Total Bilirubin: 1.7 mg/dL — ABNORMAL HIGH (ref 0.0–1.2)
Total Protein: 6.7 g/dL (ref 6.5–8.1)

## 2024-04-03 LAB — VITAMIN B12: Vitamin B-12: <150 pg/mL — ABNORMAL LOW (ref 180–914)

## 2024-04-03 MED ORDER — STERILE WATER FOR INJECTION IJ SOLN: 5.0000 mL | Freq: Four times a day (QID) | OROMUCOSAL | 3 refills | Status: DC | PRN

## 2024-04-03 NOTE — Progress Notes (Signed)
 Please inform pt that his B12 is low- and I would recommend injection monthly-  And follow up as planned-   Will order IF/Anti-parietal antibodies at next visit- GB

## 2024-04-03 NOTE — Assessment & Plan Note (Addendum)
 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  # Most recently on cabozantinib 40 mg once daily on 09/15/2023-grade 3, palmo-plantar erythrodysesthesia (treated with topical steroids), mouth sores and glossitis.  Caused poor p.o. intake and weight loss.  Discontinued on 09/29/2023. # SEP 22nd- 2025- 1. Stable examination status post left nephrectomy without evidence of local recurrence;  Similar size of the 5 mm filling defect in the IVC at the level of the left renal vein, nonspecific suggest continued attention on follow-up imaging;  No convincing evidence of metastatic disease in the chest, abdomen or pelvis.   # nausea in AM-improves. NO vomiting. Notes to have head aches- mostly in AM-low clinical concern for any metastatic disease to the brain however given the otherwise unexplained nature of the headaches nausea recommend MRI of the brain.  # GERD:  Symmetric wall thickening of a nondistended stomach with submucosal fibrofatty infiltration - on Protonix -hold off on EGD at this time.   #  Dry mouth- etiology unclear -likely second immunotherapy /developed post cabozantinib- continue  cevelimine TID.    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  Completed SBRT x 5 sessions in October 2024- CT femur with [facility] in July 2025.- Similar lucent lesion in the right femoral neck as compared to CT January 2025    # Bilateral PE- Detected on 12/19/2022.for now continue 2.5 mg BID. -Echo showed global hypokinesis with EF of 40 to 45%.  Follows with cardiology Dr. Francyne   # Left lower lobe pulmonary nodule:  s/p EBUS with Dr. Philipp on 12/09/22. Lot of atelectasis present.  Transbronchial biopsy was negative for malignancy.   #  Iron  deficiency anemia: IN tolerate iron  pills.  Completed IV Venofer  200 mg x 5 doses- 13. Monitor.    # Borderline low vitamin B12: Vitamin B12 239.  Recommended B12 supplements 1000 mcg daily over-the-counter.   # Access- port- port flush every 6-12 weeks  will need CT prior to next visit  # DISPOSITION: # MRI Brain- DRI ASAP # Repeat BP # in 2 months- port flush # follow up in 4 months MD;  port/lab (cbc, cmp, b12), thyroid  profile- Dr.B  # I reviewed the blood work- with the patient in detail; also reviewed the imaging independently [as summarized above]; and with the patient in detail.
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
## 2024-04-03 NOTE — Progress Notes (Signed)
 CT CAP 03/26/24.  Would like refill of magic mouth wash called in to have on hand.

## 2024-04-04 ENCOUNTER — Encounter: Payer: Self-pay | Admitting: *Deleted

## 2024-04-04 ENCOUNTER — Other Ambulatory Visit: Payer: Self-pay

## 2024-04-04 ENCOUNTER — Other Ambulatory Visit: Payer: Self-pay | Admitting: *Deleted

## 2024-04-04 DIAGNOSIS — C642 Malignant neoplasm of left kidney, except renal pelvis: Secondary | ICD-10-CM

## 2024-04-04 LAB — THYROID PANEL WITH TSH
Free Thyroxine Index: 2 (ref 1.2–4.9)
T3 Uptake Ratio: 29 % (ref 24–39)
T4, Total: 6.9 ug/dL (ref 4.5–12.0)
TSH: 0.391 u[IU]/mL — ABNORMAL LOW (ref 0.450–4.500)

## 2024-04-04 NOTE — Addendum Note (Signed)
 Addended by: JOSHUA ALFONSO CROME on: 04/04/2024 11:22 AM   Modules accepted: Orders

## 2024-04-10 ENCOUNTER — Other Ambulatory Visit: Payer: Self-pay | Admitting: Internal Medicine

## 2024-04-10 ENCOUNTER — Ambulatory Visit
Admission: RE | Admit: 2024-04-10 | Discharge: 2024-04-10 | Disposition: A | Source: Ambulatory Visit | Attending: Internal Medicine

## 2024-04-10 ENCOUNTER — Ambulatory Visit: Payer: Self-pay | Admitting: Internal Medicine

## 2024-04-10 DIAGNOSIS — C642 Malignant neoplasm of left kidney, except renal pelvis: Secondary | ICD-10-CM

## 2024-04-10 DIAGNOSIS — R519 Headache, unspecified: Secondary | ICD-10-CM | POA: Diagnosis not present

## 2024-04-10 DIAGNOSIS — R110 Nausea: Secondary | ICD-10-CM

## 2024-04-10 MED ORDER — GADOPICLENOL 0.5 MMOL/ML IV SOLN
7.0000 mL | Freq: Once | INTRAVENOUS | Status: AC | PRN
  Administered 2024-04-10: 7 mL via INTRAVENOUS

## 2024-04-10 NOTE — Progress Notes (Signed)
 Will order follow-up imaging- in 4 months-

## 2024-04-11 ENCOUNTER — Inpatient Hospital Stay: Attending: Nurse Practitioner

## 2024-04-11 DIAGNOSIS — D508 Other iron deficiency anemias: Secondary | ICD-10-CM

## 2024-04-11 DIAGNOSIS — E538 Deficiency of other specified B group vitamins: Secondary | ICD-10-CM | POA: Insufficient documentation

## 2024-04-11 MED ORDER — CYANOCOBALAMIN 1000 MCG/ML IJ SOLN
1000.0000 ug | Freq: Once | INTRAMUSCULAR | Status: AC
  Administered 2024-04-11: 1000 ug via INTRAMUSCULAR
  Filled 2024-04-11: qty 1

## 2024-04-12 ENCOUNTER — Encounter: Payer: Self-pay | Admitting: Internal Medicine

## 2024-05-05 ENCOUNTER — Other Ambulatory Visit: Payer: Self-pay | Admitting: Endocrinology

## 2024-05-05 DIAGNOSIS — E1165 Type 2 diabetes mellitus with hyperglycemia: Secondary | ICD-10-CM

## 2024-05-06 ENCOUNTER — Other Ambulatory Visit: Payer: Self-pay | Admitting: Student in an Organized Health Care Education/Training Program

## 2024-05-06 DIAGNOSIS — J3089 Other allergic rhinitis: Secondary | ICD-10-CM

## 2024-05-11 ENCOUNTER — Inpatient Hospital Stay: Attending: Nurse Practitioner

## 2024-05-11 DIAGNOSIS — E538 Deficiency of other specified B group vitamins: Secondary | ICD-10-CM | POA: Insufficient documentation

## 2024-05-11 DIAGNOSIS — D508 Other iron deficiency anemias: Secondary | ICD-10-CM

## 2024-05-11 MED ORDER — CYANOCOBALAMIN 1000 MCG/ML IJ SOLN
1000.0000 ug | Freq: Once | INTRAMUSCULAR | Status: AC
  Administered 2024-05-11: 1000 ug via INTRAMUSCULAR
  Filled 2024-05-11: qty 1

## 2024-05-22 ENCOUNTER — Encounter: Payer: Self-pay | Admitting: Internal Medicine

## 2024-05-29 ENCOUNTER — Inpatient Hospital Stay (HOSPITAL_BASED_OUTPATIENT_CLINIC_OR_DEPARTMENT_OTHER): Admitting: Hospice and Palliative Medicine

## 2024-05-29 DIAGNOSIS — Z515 Encounter for palliative care: Secondary | ICD-10-CM

## 2024-05-29 DIAGNOSIS — F419 Anxiety disorder, unspecified: Secondary | ICD-10-CM | POA: Diagnosis not present

## 2024-05-29 NOTE — Progress Notes (Signed)
 Virtual Visit via Telephone Note  I connected with Paul Flowers on 05/29/24 at  1:00 PM EST by telephone and verified that I am speaking with the correct person using two identifiers.  Location: Patient: Car Provider: Clinic   I discussed the limitations, risks, security and privacy concerns of performing an evaluation and management service by telephone and the availability of in person appointments. I also discussed with the patient that there may be a patient responsible charge related to this service. The patient expressed understanding and agreed to proceed.   History of Present Illness: Paul Flowers is a 53 y.o. 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  Palliative care was consulted to address goals and manage ongoing symptoms.   Observations/Objective: I called and spoke with patient by phone.  Patient reports he is doing well.  He denies any significant changes or concerns.  No symptomatic complaints at present.  He reports that anxiety is much improved.    Assessment and Plan: Anxiety/depression -continue citalopram .  Overall stable.  Follow Up Instructions: Follow-up telephone visit 4 to 6 months   I discussed the assessment and treatment plan with the patient. The patient was provided an opportunity to ask questions and all were answered. The patient agreed with the plan and demonstrated an understanding of the instructions.   The patient was advised to call back or seek an in-person evaluation if the symptoms worsen or if the condition fails to improve as anticipated.  I provided 10 minutes of non-face-to-face time during this encounter.   FONDA JONELLE MOWER, NP

## 2024-06-05 ENCOUNTER — Encounter

## 2024-06-11 ENCOUNTER — Inpatient Hospital Stay

## 2024-06-11 ENCOUNTER — Inpatient Hospital Stay: Attending: Nurse Practitioner

## 2024-06-19 ENCOUNTER — Other Ambulatory Visit: Payer: Self-pay | Admitting: Internal Medicine

## 2024-06-23 ENCOUNTER — Other Ambulatory Visit: Payer: Self-pay | Admitting: Internal Medicine

## 2024-06-24 ENCOUNTER — Other Ambulatory Visit: Payer: Self-pay | Admitting: Internal Medicine

## 2024-06-25 ENCOUNTER — Encounter: Payer: Self-pay | Admitting: Internal Medicine

## 2024-07-13 ENCOUNTER — Encounter: Payer: Self-pay | Admitting: Internal Medicine

## 2024-07-18 ENCOUNTER — Encounter: Payer: Self-pay | Admitting: Internal Medicine

## 2024-07-18 ENCOUNTER — Encounter: Payer: Self-pay | Admitting: Hospice and Palliative Medicine

## 2024-07-18 ENCOUNTER — Other Ambulatory Visit: Payer: Self-pay

## 2024-07-18 ENCOUNTER — Ambulatory Visit: Admitting: Internal Medicine

## 2024-07-18 VITALS — BP 142/88 | HR 73 | Temp 98.0°F | Resp 16 | Ht 70.0 in | Wt 157.0 lb

## 2024-07-18 DIAGNOSIS — E1169 Type 2 diabetes mellitus with other specified complication: Secondary | ICD-10-CM | POA: Diagnosis not present

## 2024-07-18 DIAGNOSIS — E1159 Type 2 diabetes mellitus with other circulatory complications: Secondary | ICD-10-CM

## 2024-07-18 DIAGNOSIS — Z0000 Encounter for general adult medical examination without abnormal findings: Secondary | ICD-10-CM | POA: Diagnosis not present

## 2024-07-18 DIAGNOSIS — Z23 Encounter for immunization: Secondary | ICD-10-CM | POA: Diagnosis not present

## 2024-07-18 DIAGNOSIS — E785 Hyperlipidemia, unspecified: Secondary | ICD-10-CM | POA: Diagnosis not present

## 2024-07-18 DIAGNOSIS — I152 Hypertension secondary to endocrine disorders: Secondary | ICD-10-CM | POA: Diagnosis not present

## 2024-07-18 DIAGNOSIS — E119 Type 2 diabetes mellitus without complications: Secondary | ICD-10-CM

## 2024-07-18 DIAGNOSIS — Z0001 Encounter for general adult medical examination with abnormal findings: Secondary | ICD-10-CM

## 2024-07-18 DIAGNOSIS — E538 Deficiency of other specified B group vitamins: Secondary | ICD-10-CM | POA: Diagnosis not present

## 2024-07-18 DIAGNOSIS — C7951 Secondary malignant neoplasm of bone: Secondary | ICD-10-CM

## 2024-07-18 LAB — CBC WITH DIFFERENTIAL/PLATELET
Basophils Absolute: 0.1 K/uL (ref 0.0–0.1)
Basophils Relative: 0.9 % (ref 0.0–3.0)
Eosinophils Absolute: 0.6 K/uL (ref 0.0–0.7)
Eosinophils Relative: 10.8 % — ABNORMAL HIGH (ref 0.0–5.0)
HCT: 43.5 % (ref 39.0–52.0)
Hemoglobin: 14.8 g/dL (ref 13.0–17.0)
Lymphocytes Relative: 38 % (ref 12.0–46.0)
Lymphs Abs: 2.2 K/uL (ref 0.7–4.0)
MCHC: 34 g/dL (ref 30.0–36.0)
MCV: 88.4 fl (ref 78.0–100.0)
Monocytes Absolute: 0.6 K/uL (ref 0.1–1.0)
Monocytes Relative: 10.5 % (ref 3.0–12.0)
Neutro Abs: 2.3 K/uL (ref 1.4–7.7)
Neutrophils Relative %: 39.8 % — ABNORMAL LOW (ref 43.0–77.0)
Platelets: 239 K/uL (ref 150.0–400.0)
RBC: 4.92 Mil/uL (ref 4.22–5.81)
RDW: 13.5 % (ref 11.5–15.5)
WBC: 5.7 K/uL (ref 4.0–10.5)

## 2024-07-18 LAB — BASIC METABOLIC PANEL WITH GFR
BUN: 9 mg/dL (ref 6–23)
CO2: 33 meq/L — ABNORMAL HIGH (ref 19–32)
Calcium: 9.8 mg/dL (ref 8.4–10.5)
Chloride: 102 meq/L (ref 96–112)
Creatinine, Ser: 1.04 mg/dL (ref 0.40–1.50)
GFR: 80.89 mL/min
Glucose, Bld: 67 mg/dL — ABNORMAL LOW (ref 70–99)
Potassium: 4.1 meq/L (ref 3.5–5.1)
Sodium: 141 meq/L (ref 135–145)

## 2024-07-18 LAB — MICROALBUMIN / CREATININE URINE RATIO
Creatinine,U: 224.1 mg/dL
Microalb Creat Ratio: 9.9 mg/g (ref 0.0–30.0)
Microalb, Ur: 2.2 mg/dL — ABNORMAL HIGH (ref 0.7–1.9)

## 2024-07-18 LAB — LIPID PANEL
Cholesterol: 157 mg/dL (ref 28–200)
HDL: 81.1 mg/dL
LDL Cholesterol: 60 mg/dL (ref 10–99)
NonHDL: 76.05
Total CHOL/HDL Ratio: 2
Triglycerides: 82 mg/dL (ref 10.0–149.0)
VLDL: 16.4 mg/dL (ref 0.0–40.0)

## 2024-07-18 LAB — TSH: TSH: 1.28 u[IU]/mL (ref 0.35–5.50)

## 2024-07-18 LAB — FOLATE: Folate: 15.3 ng/mL

## 2024-07-18 LAB — HEMOGLOBIN A1C: Hgb A1c MFr Bld: 6 % (ref 4.6–6.5)

## 2024-07-18 LAB — PSA: PSA: 0.46 ng/mL (ref 0.10–4.00)

## 2024-07-18 MED ORDER — SHINGRIX 50 MCG/0.5ML IM SUSR: 0.5000 mL | Freq: Once | INTRAMUSCULAR | 1 refills | Status: AC

## 2024-07-18 MED ORDER — CYANOCOBALAMIN 1000 MCG/ML IJ SOLN
1000.0000 ug | Freq: Once | INTRAMUSCULAR | Status: AC
  Administered 2024-07-18: 1000 ug via INTRAMUSCULAR

## 2024-07-18 MED ORDER — COVID-19 MRNA VAC-TRIS(PFIZER) 30 MCG/0.3ML IM SUSY: 0.3000 mL | PREFILLED_SYRINGE | Freq: Once | INTRAMUSCULAR | 0 refills | Status: AC

## 2024-07-18 MED ORDER — OXYCODONE-ACETAMINOPHEN 5-325 MG PO TABS: 1.0000 | ORAL_TABLET | ORAL | 0 refills | Status: AC | PRN

## 2024-07-18 NOTE — Progress Notes (Signed)
 "  Subjective:  Patient ID: Paul Flowers, male    DOB: 1971-05-22  Age: 54 y.o. MRN: [id number]  CC: Diabetes (Overdue 6 month follow up ), Annual Exam, Hypertension, Hyperlipidemia, and Anemia 2  HPI [name] presents for a CPX and f/up ---  Discussed the use of AI scribe software for clinical note transcription with the patient, who gave verbal consent to proceed.  History of Present Illness Paul Flowers is a 54 year old male who presents for follow-up.  His renal cancer is in remission, and he does not currently require chemotherapy or immunotherapy. He undergoes scans every three months, with the last scan performed last Tuesday, focusing on the femur where a lesion was previously identified. No changes were noted compared to the last visit. He is scheduled for a full body CT scan this Friday to ensure no new formations.  He has not seen his endocrinologist for over a year since Dr. Frutoso retired and has not scheduled an appointment with the new doctor. He denies any significant blood sugar issues, although he occasionally experiences symptoms of hypoglycemia, which he manages by checking his levels and consuming food as needed. He experiences occasional headaches but no other significant symptoms.  He describes experiencing left jaw pain for about two weeks, particularly when yawning or opening his mouth. No swelling, nasal discharge, epistaxis, or phlegm. No fever, chills, cough, night sweats, weight loss, or earaches.  He remains physically active, walking his German Shepherd daily, achieving 10,000 steps without experiencing chest pain or shortness of breath. Initially, he felt fatigued but has since adjusted to the routine.  He has not received a flu shot or COVID vaccine in over a year.  No symptoms related to prostate cancer, such as pelvic pain or urinary issues, and reports good urinary flow despite having one kidney.     Outpatient Medications Prior to Visit   Medication Sig Dispense Refill   acetaminophen  (TYLENOL ) 325 MG tablet Take 2 tablets (650 mg total) by mouth every 6 (six) hours as needed for mild pain (pain score 1-3).     apixaban  (ELIQUIS ) 2.5 MG TABS tablet Take 2 tablets (5 mg total) by mouth 2 (two) times daily. 60 tablet 3   cevimeline  (EVOXAC ) 30 MG capsule TAKE 1 CAPSULE BY MOUTH 3 TIMES DAILY. 270 capsule 2   citalopram  (CELEXA ) 10 MG tablet TAKE 1 TABLET BY MOUTH EVERY DAY 90 tablet 2   fluticasone  (FLONASE ) 50 MCG/ACT nasal spray INSTILL 1 SPRAY INTO BOTH NOSTRILS DAILY 48 mL 1   loratadine  (CLARITIN ) 10 MG tablet Take 1 tablet (10 mg total) by mouth daily as needed for allergies. 60 tablet 0   metoprolol  succinate (TOPROL  XL) 25 MG 24 hr tablet Take 1 tablet (25 mg total) by mouth daily. 90 tablet 3   ONE TOUCH LANCETS MISC Use to test blood sugar once daily 200 each 2   oxyCODONE -acetaminophen  (PERCOCET/ROXICET) 5-325 MG tablet Take 1 tablet by mouth every 4 (four) hours as needed for severe pain (pain score 7-10). 45 tablet 0   atorvastatin  (LIPITOR) 20 MG tablet TAKE 1 TABLET BY MOUTH EVERY DAY 90 tablet 1   Dapagliflozin  Pro-metFORMIN  ER 11-998 MG TB24 TAKE 1 TABLET BY MOUTH EVERY DAY 90 tablet 3   montelukast  (SINGULAIR ) 10 MG tablet TAKE 1 TABLET BY MOUTH EVERYDAY AT BEDTIME. Needs follow up for refills 90 tablet 0   pantoprazole  (PROTONIX ) 40 MG tablet TAKE 1 TABLET BY MOUTH EVERY DAY 90 tablet  1   CABOMETYX  40 MG tablet TAKE 1 TABLET BY MOUTH 1 TIME A DAY, TAKE ON AN EMPTY STOMACH, 1 HOUR BEFORE OR 2 HOURS AFTER MEALS. (Patient not taking: Reported on 04/03/2024) 30 tablet 0   dexamethasone  (DECADRON ) 0.5 MG/5ML solution Swish 10mL by mouth 4 times daily. Swish for 2 minutes then spit out. Avoid eating or drinking for at least 1 hour after. 500 mL 1   Ferric Maltol  (ACCRUFER ) 30 MG CAPS Take 1 capsule (30 mg total) by mouth in the morning and at bedtime. (Patient not taking: Reported on 04/03/2024) 180 capsule 0   magic  mouthwash (multi-ingredient) oral suspension Swish and swallow 5-10 mLs 4 (four) times daily as needed. 480 mL 3   magic mouthwash (multi-ingredient) oral suspension Swish and swallow 5-10 mLs 4 (four) times daily as needed. 480 mL 3   prednisoLONE  sodium phosphate  (INFLAMASE FORTE ) 1 % ophthalmic solution 1 drop 4 (four) times daily. (Patient not taking: Reported on 04/03/2024)     No facility-administered medications prior to visit.    ROS Review of Systems  Constitutional:  Positive for fatigue. Negative for appetite change, chills, diaphoresis, fever and unexpected weight change.  HENT: Negative.    Eyes: Negative.   Respiratory: Negative.  Negative for cough, chest tightness, shortness of breath and wheezing.   Cardiovascular:  Negative for chest pain, palpitations and leg swelling.  Gastrointestinal:  Negative for abdominal pain, constipation, diarrhea, nausea and vomiting.  Endocrine: Negative.   Genitourinary: Negative.   Musculoskeletal: Negative.  Negative for arthralgias and myalgias.  Skin: Negative.   Neurological: Negative.  Negative for dizziness and weakness.  Hematological:  Negative for adenopathy. Does not bruise/bleed easily.  Psychiatric/Behavioral: Negative.      Objective:  BP (!) 142/88 (BP Location: Right Arm, Patient Position: Sitting, Cuff Size: Normal)   Pulse 73   Temp 98 F (36.7 C) (Oral)   Resp 16   Ht 5' 10 (1.778 m)   Wt 157 lb (71.2 kg)   SpO2 98%   BMI 22.53 kg/m   BP Readings from Last 3 Encounters:  07/18/24 (!) 142/88  04/03/24 (!) 130/96  02/01/24 132/89    Wt Readings from Last 3 Encounters:  07/18/24 157 lb (71.2 kg)  04/03/24 146 lb 4.8 oz (66.4 kg)  02/01/24 142 lb 3.2 oz (64.5 kg)    Physical Exam Vitals reviewed.  Constitutional:      Appearance: Normal appearance.  HENT:     Head: Normocephalic and atraumatic. No abrasion.     Jaw: There is normal jaw occlusion. No trismus, tenderness, swelling, pain on movement or  malocclusion.     Salivary Glands: Right salivary gland is not diffusely enlarged or tender. Left salivary gland is not tender.     Mouth/Throat:     Pharynx: Oropharynx is clear.     Tonsils: No tonsillar exudate or tonsillar abscesses.  Cardiovascular:     Rate and Rhythm: Normal rate and regular rhythm.     Heart sounds: Normal heart sounds, S1 normal and S2 normal. No murmur heard.    No gallop.     Comments: EKG--- SR with PSVC's, 67 bpm ++artifact No LVH, Q waves, or ST/T wave changes  Pulmonary:     Effort: Pulmonary effort is normal.     Breath sounds: No stridor. No wheezing, rhonchi or rales.  Abdominal:     General: Abdomen is flat.     Palpations: There is no mass.     Tenderness:  There is no abdominal tenderness. There is no guarding.     Hernia: No hernia is present.  Musculoskeletal:        General: Normal range of motion.     Cervical back: Neck supple.     Right lower leg: No edema.     Left lower leg: No edema.  Lymphadenopathy:     Cervical: No cervical adenopathy.  Skin:    General: Skin is warm and dry.  Neurological:     General: No focal deficit present.     Mental Status: He is alert.  Psychiatric:        Mood and Affect: Mood normal.        Behavior: Behavior normal.     Lab Results  Component Value Date   WBC 5.7 07/18/2024   HGB 14.8 07/18/2024   HCT 43.5 07/18/2024   PLT 239.0 07/18/2024   GLUCOSE 67 (L) 07/18/2024   CHOL 157 07/18/2024   TRIG 82.0 07/18/2024   HDL 81.10 07/18/2024   LDLDIRECT 149.3 02/12/2013   LDLCALC 60 07/18/2024   ALT 14 04/03/2024   AST 20 04/03/2024   NA 141 07/18/2024   K 4.1 07/18/2024   CL 102 07/18/2024   CREATININE 1.04 07/18/2024   BUN 9 07/18/2024   CO2 33 (H) 07/18/2024   TSH 1.28 07/18/2024   PSA 0.46 07/18/2024   INR 1.6 (H) 05/15/2023   HGBA1C 6.0 07/18/2024   MICROALBUR 2.2 (H) 07/18/2024    MR BRAIN W WO CONTRAST Result Date: 04/10/2024 EXAM: MRI BRAIN WITH AND WITHOUT CONTRAST  04/10/2024 12:50:09 PM TECHNIQUE: Multiplanar multisequence MRI of the head/brain was performed with and without the administration of intravenous contrast. Contrast : 7 ml vueway  COMPARISON: Brain MRI 11/10/2022. CLINICAL HISTORY: 54 year old male. Morning headaches, nausea without vomiting, and history of left kidney cancer. FINDINGS: BRAIN AND VENTRICLES: No acute infarct. No acute intracranial hemorrhage. No mass effect or midline shift. No hydrocephalus. The sella is unremarkable. Normal flow voids. Major dural venous sinuses are enhancing postcontrast and appear to be patent. Cerebral volume is stable and normal for age. No abnormal enhancement identified. No dural thickening. No chronic cerebral blood products (T2 star imaging). ORBITS: No acute abnormality. SINUSES: No acute abnormality. BONES AND SOFT TISSUES: Normal bone marrow signal and enhancement. No acute soft tissue abnormality. IMPRESSION: 1. No acute or metastatic intracranial abnormality. Electronically signed by: Helayne Hurst MD 04/10/2024 01:19 PM EDT RP Workstation: [id number]    Assessment & Plan:   Controlled type 2 diabetes mellitus without complication, without long-term current use of insulin  (HCC)- His blood sugar is over-controlled. Will discontinue the SGLT2-inh and will lower the metformin  dose. -     COVID-19 mRNA Vac-TriS(Pfizer); Inject 0.3 mLs into the muscle once for 1 dose.  Dispense: 0.3 mL; Refill: 0 -     HM Diabetes Foot Exam -     Microalbumin / creatinine urine ratio; Future -     Basic metabolic panel with GFR; Future -     Hemoglobin A1c; Future -     Urinalysis, Routine w reflex microscopic; Future -     Hepatitis B surface antibody,quantitative; Future -     AMB Referral VBCI Care Management -     metFORMIN  HCl ER; Take 1 tablet (500 mg total) by mouth daily with breakfast.  Dispense: 90 tablet; Refill: 0  Need for prophylactic vaccination and inoculation against varicella -     Shingrix ; Inject 0.5 mLs  into the muscle  once for 1 dose.  Dispense: 0.5 mL; Refill: 1  Need for immunization against influenza -     Flu vaccine trivalent PF, 6mos and older(Flulaval,Afluria,Fluarix,Fluzone )  Hypertension associated with diabetes (HCC) -     Basic metabolic panel with GFR; Future -     Urinalysis, Routine w reflex microscopic; Future -     TSH; Future -     EKG 12-Lead  Vitamin B 12 deficiency -     CBC with Differential/Platelet; Future -     Folate; Future -     Cyanocobalamin   Encounter for general adult medical examination with abnormal findings- Exam completed, labs reviewed, vaccines reviewed and updated, cancer screenings addressed, pt ed material was given.  -     Lipid panel; Future -     PSA; Future  Hyperlipidemia associated with type 2 diabetes mellitus (HCC) -     Lipid panel; Future -     TSH; Future -     Atorvastatin  Calcium ; Take 1 tablet (20 mg total) by mouth daily.  Dispense: 90 tablet; Refill: 1     Follow-up: Return in about 6 months (around 01/15/2025).  Debby Molt, MD "

## 2024-07-18 NOTE — Patient Instructions (Signed)
 Health Maintenance, Male  Adopting a healthy lifestyle and getting preventive care are important in promoting health and wellness. Ask your health care provider about:  The right schedule for you to have regular tests and exams.  Things you can do on your own to prevent diseases and keep yourself healthy.  What should I know about diet, weight, and exercise?  Eat a healthy diet    Eat a diet that includes plenty of vegetables, fruits, low-fat dairy products, and lean protein.  Do not eat a lot of foods that are high in solid fats, added sugars, or sodium.  Maintain a healthy weight  Body mass index (BMI) is a measurement that can be used to identify possible weight problems. It estimates body fat based on height and weight. Your health care provider can help determine your BMI and help you achieve or maintain a healthy weight.  Get regular exercise  Get regular exercise. This is one of the most important things you can do for your health. Most adults should:  Exercise for at least 150 minutes each week. The exercise should increase your heart rate and make you sweat (moderate-intensity exercise).  Do strengthening exercises at least twice a week. This is in addition to the moderate-intensity exercise.  Spend less time sitting. Even light physical activity can be beneficial.  Watch cholesterol and blood lipids  Have your blood tested for lipids and cholesterol at 56 years of age, then have this test every 5 years.  You may need to have your cholesterol levels checked more often if:  Your lipid or cholesterol levels are high.  You are older than 56 years of age.  You are at high risk for heart disease.  What should I know about cancer screening?  Many types of cancers can be detected early and may often be prevented. Depending on your health history and family history, you may need to have cancer screening at various ages. This may include screening for:  Colorectal cancer.  Prostate cancer.  Skin cancer.  Lung  cancer.  What should I know about heart disease, diabetes, and high blood pressure?  Blood pressure and heart disease  High blood pressure causes heart disease and increases the risk of stroke. This is more likely to develop in people who have high blood pressure readings or are overweight.  Talk with your health care provider about your target blood pressure readings.  Have your blood pressure checked:  Every 3-5 years if you are 24-52 years of age.  Every year if you are 3 years old or older.  If you are between the ages of 60 and 72 and are a current or former smoker, ask your health care provider if you should have a one-time screening for abdominal aortic aneurysm (AAA).  Diabetes  Have regular diabetes screenings. This checks your fasting blood sugar level. Have the screening done:  Once every three years after age 66 if you are at a normal weight and have a low risk for diabetes.  More often and at a younger age if you are overweight or have a high risk for diabetes.  What should I know about preventing infection?  Hepatitis B  If you have a higher risk for hepatitis B, you should be screened for this virus. Talk with your health care provider to find out if you are at risk for hepatitis B infection.  Hepatitis C  Blood testing is recommended for:  Everyone born from [birth year] through 1967.  Anyone  with known risk factors for hepatitis C.  Sexually transmitted infections (STIs)  You should be screened each year for STIs, including gonorrhea and chlamydia, if:  You are sexually active and are younger than 56 years of age.  You are older than 56 years of age and your health care provider tells you that you are at risk for this type of infection.  Your sexual activity has changed since you were last screened, and you are at increased risk for chlamydia or gonorrhea. Ask your health care provider if you are at risk.  Ask your health care provider about whether you are at high risk for HIV. Your health care provider  may recommend a prescription medicine to help prevent HIV infection. If you choose to take medicine to prevent HIV, you should first get tested for HIV. You should then be tested every 3 months for as long as you are taking the medicine.  Follow these instructions at home:  Alcohol use  Do not drink alcohol if your health care provider tells you not to drink.  If you drink alcohol:  Limit how much you have to 0-2 drinks a day.  Know how much alcohol is in your drink. In the [place] one drink equals one 12 oz bottle of beer (355 mL), one 5 oz glass of wine (148 mL), or one 1 oz glass of hard liquor (44 mL).  Lifestyle  Do not use any products that contain nicotine or tobacco. These products include cigarettes, chewing tobacco, and vaping devices, such as e-cigarettes. If you need help quitting, ask your health care provider.  Do not use street drugs.  Do not share needles.  Ask your health care provider for help if you need support or information about quitting drugs.  General instructions  Schedule regular health, dental, and eye exams.  Stay current with your vaccines.  Tell your health care provider if:  You often feel depressed.  You have ever been abused or do not feel safe at home.  Summary  Adopting a healthy lifestyle and getting preventive care are important in promoting health and wellness.  Follow your health care provider's instructions about healthy diet, exercising, and getting tested or screened for diseases.  Follow your health care provider's instructions on monitoring your cholesterol and blood pressure.  This information is not intended to replace advice given to you by your health care provider. Make sure you discuss any questions you have with your health care provider.  Document Revised: 11/10/2020 Document Reviewed: 11/10/2020  Elsevier Patient Education  2024 [employer].

## 2024-07-19 ENCOUNTER — Telehealth: Payer: Self-pay | Admitting: Pharmacy Technician

## 2024-07-19 ENCOUNTER — Ambulatory Visit: Payer: Self-pay | Admitting: Internal Medicine

## 2024-07-19 ENCOUNTER — Other Ambulatory Visit (HOSPITAL_COMMUNITY): Payer: Self-pay

## 2024-07-19 ENCOUNTER — Other Ambulatory Visit: Payer: Self-pay | Admitting: Internal Medicine

## 2024-07-19 LAB — URINALYSIS, ROUTINE W REFLEX MICROSCOPIC
Bilirubin Urine: NEGATIVE
Hgb urine dipstick: NEGATIVE
Ketones, ur: NEGATIVE
Leukocytes,Ua: NEGATIVE
Nitrite: NEGATIVE
RBC / HPF: NONE SEEN
Specific Gravity, Urine: >=1.03 — AB (ref 1.000–1.030)
Total Protein, Urine: NEGATIVE
Urine Glucose: 500 — AB
Urobilinogen, UA: 0.2 (ref 0.0–1.0)
pH: 6 (ref 5.0–8.0)

## 2024-07-19 LAB — HEPATITIS B SURFACE ANTIBODY, QUANTITATIVE: Hep B S AB Quant (Post): <5 m[IU]/mL — ABNORMAL LOW

## 2024-07-19 MED ORDER — ATORVASTATIN CALCIUM 20 MG PO TABS: 20.0000 mg | ORAL_TABLET | Freq: Every day | ORAL | 1 refills | Status: AC

## 2024-07-19 NOTE — Telephone Encounter (Signed)
 Oral Oncology Patient Advocate Encounter  Prior Authorization for oxyCODONE -acetaminophen  (PERCOCET/ROXICET) 5-325 MG tablet has been approved.    PA# [id number] Effective dates: 06/19/2024 through 07/19/2025  Barbette ([name]) [name], CPhT  [facility], [name], [place] Hematology/Oncology - Oral Chemotherapy Patient Advocate Specialist III Phone: [phone]  Fax: [phone]

## 2024-07-19 NOTE — Telephone Encounter (Signed)
 Oral Oncology Patient Advocate Encounter   New authorization   Received notification that prior authorization for oxyCODONE -acetaminophen  (PERCOCET/ROXICET) 5-325 MG tablet is required.   PA submitted on CMM via Latent Key [id number] Status is pending     Paul Flowers ([name]) [name], CPhT  [name], [name], [name] - Oral Chemotherapy Patient Advocate Specialist III Phone: [phone]  Fax: [phone]

## 2024-07-20 ENCOUNTER — Telehealth: Payer: Self-pay | Admitting: *Deleted

## 2024-07-20 ENCOUNTER — Ambulatory Visit
Admission: RE | Admit: 2024-07-20 | Discharge: 2024-07-20 | Disposition: A | Discharge status: Home / Self Care | Source: Ambulatory Visit | Attending: Internal Medicine | Admitting: Internal Medicine

## 2024-07-20 ENCOUNTER — Inpatient Hospital Stay: Admission: RE | Admit: 2024-07-20 | Source: Ambulatory Visit

## 2024-07-20 DIAGNOSIS — C642 Malignant neoplasm of left kidney, except renal pelvis: Secondary | ICD-10-CM

## 2024-07-20 MED ORDER — METFORMIN HCL ER 500 MG PO TB24: 500.0000 mg | ORAL_TABLET | Freq: Every day | ORAL | 0 refills | Status: AC

## 2024-07-20 MED ORDER — IOPAMIDOL (ISOVUE-300) INJECTION 61%
100.0000 mL | Freq: Once | INTRAVENOUS | Status: AC | PRN
  Administered 2024-07-20: 100 mL via INTRAVENOUS

## 2024-07-20 NOTE — Progress Notes (Unsigned)
 Care Guide Pharmacy Note  07/20/2024 Name: MACCOY HAUBNER MRN: [id number] DOB: 1971-01-10  Referred By: Joshua Debby LITTIE, MD Reason for referral: Call Attempt #1 and Complex Care Management (Outreach to schedule referral with pharmacist )   [name] is a 54 y.o. year old male who is a primary care patient of Joshua Debby LITTIE, MD.  [name] was referred to the pharmacist for assistance related to: DMII  An unsuccessful telephone outreach was attempted today to contact the patient who was referred to the pharmacy team for assistance with medication management. Additional attempts will be made to contact the patient.  [name], CMA, AAMA [place]  [facility], [facility] Guide, Lead Direct Dial: [phone]  Fax: [phone]

## 2024-07-23 NOTE — Progress Notes (Unsigned)
 Care Guide Pharmacy Note  07/23/2024 Name: Paul Flowers MRN: [id number] DOB: 1971-01-13  Referred By: Joshua Debby LITTIE, MD Reason for referral: Call Attempt #1 and Complex Care Management (Outreach to schedule referral with pharmacist )   [name] is a 54 y.o. year old male who is a primary care patient of Joshua Debby LITTIE, MD.  [name] was referred to the pharmacist for assistance related to: DMII  A second unsuccessful telephone outreach was attempted today to contact the patient who was referred to the pharmacy team for assistance with medication management. Additional attempts will be made to contact the patient.  [name], CMA, AAMA [place]  [facility], [facility] Guide, Lead Direct Dial: [phone]  Fax: [phone]

## 2024-07-24 NOTE — Progress Notes (Signed)
 Care Guide Pharmacy Note  07/24/2024 Name: Paul Flowers MRN: [id number] DOB: 10-24-1970  Referred By: Joshua Debby LITTIE, MD Reason for referral: Call Attempt #1 and Complex Care Management (Outreach to schedule referral with pharmacist )   Paul Flowers is a 54 y.o. year old male who is a primary care patient of Joshua Debby LITTIE, MD.  Paul Flowers was referred to the pharmacist for assistance related to: DMII  A third unsuccessful telephone outreach was attempted today to contact the patient who was referred to the pharmacy team for assistance with medication management. The Population Health team is pleased to engage with this patient at any time in the future upon receipt of referral and should he/she be interested in assistance from the Population Health team.  [name], CMA, [facility]  [facility], [facility] Guide, Lead Direct Dial: [phone]  Fax: [phone]

## 2024-08-03 ENCOUNTER — Other Ambulatory Visit

## 2024-08-03 ENCOUNTER — Ambulatory Visit: Admitting: Internal Medicine

## 2024-08-03 ENCOUNTER — Ambulatory Visit

## 2024-08-06 ENCOUNTER — Emergency Department: Admission: EM | Admit: 2024-08-06 | Discharge: 2024-08-06 | Disposition: A | Discharge status: Home / Self Care

## 2024-08-06 ENCOUNTER — Other Ambulatory Visit: Payer: Self-pay

## 2024-08-06 ENCOUNTER — Encounter: Payer: Self-pay | Admitting: Emergency Medicine

## 2024-08-06 ENCOUNTER — Emergency Department

## 2024-08-06 ENCOUNTER — Ambulatory Visit: Payer: Self-pay

## 2024-08-06 DIAGNOSIS — R519 Headache, unspecified: Secondary | ICD-10-CM | POA: Insufficient documentation

## 2024-08-06 DIAGNOSIS — R531 Weakness: Secondary | ICD-10-CM | POA: Insufficient documentation

## 2024-08-06 DIAGNOSIS — I10 Essential (primary) hypertension: Secondary | ICD-10-CM | POA: Insufficient documentation

## 2024-08-06 LAB — URINALYSIS, ROUTINE W REFLEX MICROSCOPIC
Bacteria, UA: NONE SEEN
Bilirubin Urine: NEGATIVE
Glucose, UA: >=500 mg/dL — AB
Hgb urine dipstick: NEGATIVE
Ketones, ur: 5 mg/dL — AB
Leukocytes,Ua: NEGATIVE
Nitrite: NEGATIVE
Protein, ur: NEGATIVE mg/dL
Specific Gravity, Urine: 1.011 (ref 1.005–1.030)
Squamous Epithelial / HPF: 0 /HPF (ref 0–5)
pH: 6 (ref 5.0–8.0)

## 2024-08-06 LAB — COMPREHENSIVE METABOLIC PANEL WITH GFR
ALT: 21 U/L (ref 0–44)
AST: 21 U/L (ref 15–41)
Albumin: 4.3 g/dL (ref 3.5–5.0)
Alkaline Phosphatase: 63 U/L (ref 38–126)
Anion gap: 14 (ref 5–15)
BUN: 12 mg/dL (ref 6–20)
CO2: 24 mmol/L (ref 22–32)
Calcium: 9.4 mg/dL (ref 8.9–10.3)
Chloride: 100 mmol/L (ref 98–111)
Creatinine, Ser: 1.06 mg/dL (ref 0.61–1.24)
GFR, Estimated: >60 mL/min
Glucose, Bld: 97 mg/dL (ref 70–99)
Potassium: 3.7 mmol/L (ref 3.5–5.1)
Sodium: 138 mmol/L (ref 135–145)
Total Bilirubin: 1.8 mg/dL — ABNORMAL HIGH (ref 0.0–1.2)
Total Protein: 7.2 g/dL (ref 6.5–8.1)

## 2024-08-06 LAB — CBC WITH DIFFERENTIAL/PLATELET
Abs Immature Granulocytes: 0.02 10*3/uL (ref 0.00–0.07)
Basophils Absolute: 0 10*3/uL (ref 0.0–0.1)
Basophils Relative: 0 %
Eosinophils Absolute: 0.1 10*3/uL (ref 0.0–0.5)
Eosinophils Relative: 1 %
HCT: 41.6 % (ref 39.0–52.0)
Hemoglobin: 14 g/dL (ref 13.0–17.0)
Immature Granulocytes: 0 %
Lymphocytes Relative: 7 %
Lymphs Abs: 0.6 10*3/uL — ABNORMAL LOW (ref 0.7–4.0)
MCH: 29.7 pg (ref 26.0–34.0)
MCHC: 33.7 g/dL (ref 30.0–36.0)
MCV: 88.1 fL (ref 80.0–100.0)
Monocytes Absolute: 1.3 10*3/uL — ABNORMAL HIGH (ref 0.1–1.0)
Monocytes Relative: 16 %
Neutro Abs: 5.8 10*3/uL (ref 1.7–7.7)
Neutrophils Relative %: 76 %
Platelets: 208 10*3/uL (ref 150–400)
RBC: 4.72 MIL/uL (ref 4.22–5.81)
RDW: 11.9 % (ref 11.5–15.5)
WBC: 7.7 10*3/uL (ref 4.0–10.5)
nRBC: 0 % (ref 0.0–0.2)

## 2024-08-06 LAB — RESP PANEL BY RT-PCR (RSV, FLU A&B, COVID)  RVPGX2
Influenza A by PCR: NEGATIVE
Influenza B by PCR: NEGATIVE
Resp Syncytial Virus by PCR: NEGATIVE
SARS Coronavirus 2 by RT PCR: NEGATIVE

## 2024-08-06 LAB — TROPONIN T, HIGH SENSITIVITY: Troponin T High Sensitivity: <6 ng/L (ref 0–19)

## 2024-08-06 LAB — LACTIC ACID, PLASMA: Lactic Acid, Venous: 1.1 mmol/L (ref 0.5–1.9)

## 2024-08-06 NOTE — ED Notes (Signed)
 2 sets of blood cultures and two tiger tops sent to lab w/ bloodwork at this time.

## 2024-08-06 NOTE — ED Notes (Addendum)
 Pt c/o feverish feeling, weakness, and headache. No family members w/ similar s/s. Pt not currently getting chemo or radiation- states they are just doing regular follow-up xrays. Pt is AOX4, NAD noted. Lung sounds clear bilaterally. Pt reports he threw up today, reports mild nausea, pt also reports DOE.

## 2024-08-06 NOTE — Discharge Instructions (Addendum)
 Please be sure to drink plenty of fluids.  Follow-up with your primary care physician within the next few days.  Return to the emergency department for any new or worsening symptoms.

## 2024-08-06 NOTE — Telephone Encounter (Signed)
 FYI Only or Action Required?: FYI only for provider: ED advised. Via 911 transport.  Patient was last seen in primary care on 07/18/2024 by Joshua Debby CROME, MD.  Called Nurse Triage reporting Headache.  Symptoms began today.  Interventions attempted: Nothing.  Symptoms are: unchanged.  Triage Disposition: Call EMS 911 Now  Patient/caregiver understands and will follow disposition?: Yes   Message from [facility] sent at 08/06/2024 11:06 AM EST  Reason for Triage: Fever, Fatigue, vomiting and severe headache   Reason for Disposition  Difficult to awaken or acting confused (e.g., disoriented, slurred speech)  Answer Assessment - Initial Assessment Questions Patient with recent travel home from [place]. Patient mentioned having an episode of confusion and being disoriented with nausea, vomiting and headache. Patient agreeable to calling 911 on his own and mentioned that he would be heading to [facility].   2. ONSET: When did the headache start? (e.g., minutes, hours, days)      This morning  4. SEVERITY: How bad is the pain? and What does it keep you from doing?  (e.g., Scale 1-10; mild, moderate, or severe)      Big headache  9. OTHER SYMPTOMS: Do you have any other symptoms? (e.g., fever, stiff neck, eye pain, sore throat, cold symptoms)     Scratchy throat, vomiting x1  Protocols used: Headache-A-AH

## 2024-08-06 NOTE — ED Triage Notes (Signed)
 Pt reports [place] the [place].  Has developed n/v, severe headache, and felt feverish  Had to have windows down coming here.

## 2024-08-08 ENCOUNTER — Ambulatory Visit: Payer: Self-pay

## 2024-08-08 NOTE — Telephone Encounter (Signed)
 Persistent cough, yellow mucus, itchy watery eyes x Sunday. Home care advised. Pt agreeable and states he will call back if anything worsens or changes.   FYI Only or Action Required?: FYI only for provider: home care.  Patient was last seen in primary care on 07/18/2024 by Joshua Debby CROME, MD.  Called Nurse Triage reporting URI.  Symptoms began several days ago.  Interventions attempted: OTC medications: theraflu.  Symptoms are: gradually improving.  Triage Disposition: Home Care  Patient/caregiver understands and will follow disposition?: Yes   Reason for Triage: worsening symptoms: congestion, headaches, cough.   Reason for Disposition  Cough with cold symptoms (e.g., runny nose, postnasal drip, throat clearing)  Answer Assessment - Initial Assessment Questions 1. ONSET: When did the cough begin?  Sunday  2. SEVERITY: How bad is the cough today?      It hurts to cough  3. SPUTUM: Describe the color of your sputum (e.g., none, dry cough; clear, white, yellow, green)     Yellow  4. HEMOPTYSIS: Are you coughing up any blood? If Yes, ask: How much? (e.g., flecks, streaks, tablespoons, etc.)     Denies  5. DIFFICULTY BREATHING: Are you having difficulty breathing? If Yes, ask: How bad is it? (e.g., mild, moderate, severe)      Denies  6. FEVER: Do you have a fever? If Yes, ask: What is your temperature, how was it measured, and when did it start?     Denies  7. CARDIAC HISTORY: Do you have any history of heart disease? (e.g., heart attack, congestive heart failure)      HTN  8. LUNG HISTORY: Do you have any history of lung disease?  (e.g., pulmonary embolus, asthma, emphysema)     OSA  10. OTHER SYMPTOMS: Do you have any other symptoms? (e.g., runny nose, wheezing, chest pain)       Denies  Protocols used: Cough - Acute Productive-A-AH

## 2024-08-08 NOTE — Telephone Encounter (Signed)
 FYI

## 2024-08-09 ENCOUNTER — Telehealth: Payer: Self-pay

## 2024-08-09 NOTE — Progress Notes (Signed)
 Complex Care Management Note  Care Guide Note 08/09/2024 Name: [name] MRN: [id number] DOB: 1971/02/02  Paul Flowers is a 54 y.o. year old male who sees Joshua Debby LITTIE, MD for primary care. I reached out to Paul Flowers by phone today to offer complex care management services.  Paul Flowers was given information about Complex Care Management services today including:   The Complex Care Management services include support from the care team which includes your Nurse Care Manager, Clinical Social Worker, or Pharmacist.  The Complex Care Management team is here to help remove barriers to the health concerns and goals most important to you. Complex Care Management services are voluntary, and the patient may decline or stop services at any time by request to their care team member.   Complex Care Management Consent Status: Patient agreed to services and verbal consent obtained.   Follow up plan:  Telephone appointment with complex care management team member scheduled for:  08/20/24  Encounter Outcome:  Patient Scheduled  Doyce Razor [facility]  [facility], [facility] Guide Direct Dial: [phone]  Fax: [phone]

## 2024-08-14 ENCOUNTER — Inpatient Hospital Stay

## 2024-08-14 ENCOUNTER — Inpatient Hospital Stay: Admitting: Internal Medicine

## 2024-08-20 ENCOUNTER — Other Ambulatory Visit

## 2024-10-26 ENCOUNTER — Inpatient Hospital Stay: Admitting: Hospice and Palliative Medicine
